# Patient Record
Sex: Female | Born: 1935
Health system: Southern US, Community
[De-identification: ages and names within clinical notes are randomized; demographics above are authoritative.]

## PROBLEM LIST (undated history)

## (undated) DIAGNOSIS — T4145XA Adverse effect of unspecified anesthetic, initial encounter: Secondary | ICD-10-CM

## (undated) DIAGNOSIS — F329 Major depressive disorder, single episode, unspecified: Secondary | ICD-10-CM

## (undated) DIAGNOSIS — G4730 Sleep apnea, unspecified: Secondary | ICD-10-CM

## (undated) DIAGNOSIS — R519 Headache, unspecified: Secondary | ICD-10-CM

## (undated) DIAGNOSIS — E049 Nontoxic goiter, unspecified: Secondary | ICD-10-CM

## (undated) DIAGNOSIS — F32A Depression, unspecified: Secondary | ICD-10-CM

## (undated) DIAGNOSIS — K5790 Diverticulosis of intestine, part unspecified, without perforation or abscess without bleeding: Secondary | ICD-10-CM

## (undated) DIAGNOSIS — M1990 Unspecified osteoarthritis, unspecified site: Secondary | ICD-10-CM

## (undated) DIAGNOSIS — Z9889 Other specified postprocedural states: Secondary | ICD-10-CM

## (undated) DIAGNOSIS — R011 Cardiac murmur, unspecified: Secondary | ICD-10-CM

## (undated) DIAGNOSIS — T8859XA Other complications of anesthesia, initial encounter: Secondary | ICD-10-CM

## (undated) DIAGNOSIS — I341 Nonrheumatic mitral (valve) prolapse: Secondary | ICD-10-CM

## (undated) DIAGNOSIS — R51 Headache: Secondary | ICD-10-CM

## (undated) DIAGNOSIS — E042 Nontoxic multinodular goiter: Secondary | ICD-10-CM

## (undated) DIAGNOSIS — H269 Unspecified cataract: Secondary | ICD-10-CM

## (undated) DIAGNOSIS — C3490 Malignant neoplasm of unspecified part of unspecified bronchus or lung: Secondary | ICD-10-CM

## (undated) DIAGNOSIS — M8580 Other specified disorders of bone density and structure, unspecified site: Secondary | ICD-10-CM

## (undated) DIAGNOSIS — R112 Nausea with vomiting, unspecified: Secondary | ICD-10-CM

## (undated) HISTORY — DX: Depression, unspecified: F32.A

## (undated) HISTORY — DX: Nonrheumatic mitral (valve) prolapse: I34.1

## (undated) HISTORY — DX: Diverticulosis of intestine, part unspecified, without perforation or abscess without bleeding: K57.90

## (undated) HISTORY — PX: BLADDER SUSPENSION: SHX72

## (undated) HISTORY — DX: Nontoxic multinodular goiter: E04.2

## (undated) HISTORY — DX: Unspecified osteoarthritis, unspecified site: M19.90

## (undated) HISTORY — DX: Unspecified cataract: H26.9

## (undated) HISTORY — DX: Other specified disorders of bone density and structure, unspecified site: M85.80

## (undated) HISTORY — DX: Major depressive disorder, single episode, unspecified: F32.9

## (undated) HISTORY — DX: Malignant neoplasm of unspecified part of unspecified bronchus or lung: C34.90

## (undated) HISTORY — PX: BREAST BIOPSY: SHX20

## (undated) HISTORY — PX: TONSILLECTOMY: SUR1361

## (undated) HISTORY — DX: Nontoxic goiter, unspecified: E04.9

## (undated) HISTORY — PX: COLONOSCOPY: SHX174

---
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
## 1996-11-02 HISTORY — PX: ABDOMINAL HYSTERECTOMY: SHX81

## 2000-11-02 DIAGNOSIS — C3490 Malignant neoplasm of unspecified part of unspecified bronchus or lung: Secondary | ICD-10-CM

## 2000-11-02 HISTORY — DX: Malignant neoplasm of unspecified part of unspecified bronchus or lung: C34.90

## 2001-10-02 HISTORY — PX: LOBECTOMY: SHX5089

## 2001-10-03 ENCOUNTER — Encounter: Payer: Self-pay | Admitting: Thoracic Surgery

## 2001-10-04 ENCOUNTER — Inpatient Hospital Stay (HOSPITAL_COMMUNITY): Admission: RE | Admit: 2001-10-04 | Discharge: 2001-10-10 | Payer: Self-pay | Admitting: Thoracic Surgery

## 2001-10-04 ENCOUNTER — Encounter: Payer: Self-pay | Admitting: Thoracic Surgery

## 2001-10-04 ENCOUNTER — Encounter (INDEPENDENT_AMBULATORY_CARE_PROVIDER_SITE_OTHER): Payer: Self-pay | Admitting: Specialist

## 2001-10-05 ENCOUNTER — Encounter: Payer: Self-pay | Admitting: Thoracic Surgery

## 2001-10-06 ENCOUNTER — Encounter: Payer: Self-pay | Admitting: Thoracic Surgery

## 2001-10-07 ENCOUNTER — Encounter: Payer: Self-pay | Admitting: Thoracic Surgery

## 2001-10-08 ENCOUNTER — Encounter: Payer: Self-pay | Admitting: Thoracic Surgery

## 2001-10-09 ENCOUNTER — Encounter: Payer: Self-pay | Admitting: Thoracic Surgery

## 2001-10-14 ENCOUNTER — Encounter: Admission: RE | Admit: 2001-10-14 | Discharge: 2001-10-14 | Payer: Self-pay | Admitting: Thoracic Surgery

## 2001-10-14 ENCOUNTER — Encounter: Payer: Self-pay | Admitting: Thoracic Surgery

## 2001-10-25 ENCOUNTER — Encounter: Payer: Self-pay | Admitting: Thoracic Surgery

## 2001-10-25 ENCOUNTER — Encounter: Admission: RE | Admit: 2001-10-25 | Discharge: 2001-10-25 | Payer: Self-pay | Admitting: Thoracic Surgery

## 2001-12-27 ENCOUNTER — Encounter: Payer: Self-pay | Admitting: Thoracic Surgery

## 2001-12-27 ENCOUNTER — Encounter: Admission: RE | Admit: 2001-12-27 | Discharge: 2001-12-27 | Payer: Self-pay | Admitting: Thoracic Surgery

## 2002-04-26 ENCOUNTER — Encounter: Admission: RE | Admit: 2002-04-26 | Discharge: 2002-04-26 | Payer: Self-pay | Admitting: Thoracic Surgery

## 2002-04-26 ENCOUNTER — Encounter: Payer: Self-pay | Admitting: Thoracic Surgery

## 2002-08-25 ENCOUNTER — Encounter: Admission: RE | Admit: 2002-08-25 | Discharge: 2002-08-25 | Payer: Self-pay | Admitting: Thoracic Surgery

## 2002-08-25 ENCOUNTER — Encounter: Payer: Self-pay | Admitting: Thoracic Surgery

## 2003-03-13 ENCOUNTER — Encounter: Payer: Self-pay | Admitting: Thoracic Surgery

## 2003-03-13 ENCOUNTER — Encounter: Admission: RE | Admit: 2003-03-13 | Discharge: 2003-03-13 | Payer: Self-pay | Admitting: Thoracic Surgery

## 2003-08-23 ENCOUNTER — Encounter: Admission: RE | Admit: 2003-08-23 | Discharge: 2003-08-23 | Payer: Self-pay | Admitting: Thoracic Surgery

## 2003-08-23 ENCOUNTER — Encounter: Payer: Self-pay | Admitting: Thoracic Surgery

## 2005-11-23 ENCOUNTER — Ambulatory Visit: Payer: Self-pay | Admitting: Internal Medicine

## 2006-01-13 ENCOUNTER — Ambulatory Visit: Payer: Self-pay | Admitting: Emergency Medicine

## 2006-01-14 ENCOUNTER — Ambulatory Visit: Payer: Self-pay | Admitting: Internal Medicine

## 2006-02-15 ENCOUNTER — Encounter: Admission: RE | Admit: 2006-02-15 | Discharge: 2006-02-15 | Payer: Self-pay | Admitting: Obstetrics and Gynecology

## 2006-07-19 ENCOUNTER — Ambulatory Visit: Payer: Self-pay | Admitting: Internal Medicine

## 2006-07-23 ENCOUNTER — Ambulatory Visit (HOSPITAL_COMMUNITY): Admission: RE | Admit: 2006-07-23 | Discharge: 2006-07-23 | Payer: Self-pay | Admitting: Internal Medicine

## 2007-02-22 ENCOUNTER — Encounter: Admission: RE | Admit: 2007-02-22 | Discharge: 2007-02-22 | Payer: Self-pay | Admitting: Obstetrics and Gynecology

## 2007-04-14 ENCOUNTER — Encounter: Admission: RE | Admit: 2007-04-14 | Discharge: 2007-04-14 | Payer: Self-pay | Admitting: Internal Medicine

## 2007-04-14 ENCOUNTER — Ambulatory Visit: Payer: Self-pay | Admitting: Internal Medicine

## 2007-04-14 DIAGNOSIS — E049 Nontoxic goiter, unspecified: Secondary | ICD-10-CM | POA: Insufficient documentation

## 2007-04-14 LAB — CONVERTED CEMR LAB
Basophils Absolute: 0.1 10*3/uL (ref 0.0–0.1)
Basophils Relative: 1.3 % — ABNORMAL HIGH (ref 0.0–1.0)
Eosinophils Absolute: 0.2 10*3/uL (ref 0.0–0.6)
Eosinophils Relative: 2.2 % (ref 0.0–5.0)
Free T4: 0.7 ng/dL (ref 0.6–1.6)
HCT: 39.6 % (ref 36.0–46.0)
Hemoglobin: 14.1 g/dL (ref 12.0–15.0)
Lymphocytes Relative: 36.1 % (ref 12.0–46.0)
MCHC: 35.6 g/dL (ref 30.0–36.0)
MCV: 91.8 fL (ref 78.0–100.0)
Monocytes Absolute: 0.4 10*3/uL (ref 0.2–0.7)
Monocytes Relative: 4.8 % (ref 3.0–11.0)
Neutro Abs: 4 10*3/uL (ref 1.4–7.7)
Neutrophils Relative %: 55.6 % (ref 43.0–77.0)
Platelets: 306 10*3/uL (ref 150–400)
RBC: 4.31 M/uL (ref 3.87–5.11)
RDW: 11.7 % (ref 11.5–14.6)
T3, Free: 2.8 pg/mL (ref 2.3–4.2)
TSH: 0.91 microintl units/mL (ref 0.35–5.50)
WBC: 7.3 10*3/uL (ref 4.5–10.5)

## 2007-04-19 ENCOUNTER — Encounter: Admission: RE | Admit: 2007-04-19 | Discharge: 2007-04-19 | Payer: Self-pay | Admitting: Internal Medicine

## 2007-06-06 ENCOUNTER — Telehealth: Payer: Self-pay | Admitting: Internal Medicine

## 2007-06-14 ENCOUNTER — Ambulatory Visit: Payer: Self-pay | Admitting: Internal Medicine

## 2007-06-15 ENCOUNTER — Ambulatory Visit: Payer: Self-pay | Admitting: Internal Medicine

## 2007-06-15 DIAGNOSIS — K5730 Diverticulosis of large intestine without perforation or abscess without bleeding: Secondary | ICD-10-CM | POA: Insufficient documentation

## 2007-06-15 LAB — CONVERTED CEMR LAB
ALT: 28 units/L (ref 0–35)
AST: 24 units/L (ref 0–37)
BUN: 8 mg/dL (ref 6–23)
Bilirubin Urine: NEGATIVE
Cholesterol: 201 mg/dL (ref 0–200)
Creatinine, Ser: 0.6 mg/dL (ref 0.4–1.2)
Direct LDL: 129.7 mg/dL
Glucose, Urine, Semiquant: NEGATIVE
HDL: 51.4 mg/dL (ref >39.0)
Nitrite: NEGATIVE
Potassium: 3.5 meq/L (ref 3.5–5.1)
Protein, U semiquant: NEGATIVE
Specific Gravity, Urine: <1.005
Total CHOL/HDL Ratio: 3.9
Triglycerides: 106 mg/dL (ref 0–149)
Urobilinogen, UA: NEGATIVE
VLDL: 21 mg/dL (ref 0–40)
pH: 6.5

## 2007-06-23 ENCOUNTER — Encounter (INDEPENDENT_AMBULATORY_CARE_PROVIDER_SITE_OTHER): Payer: Self-pay | Admitting: *Deleted

## 2007-07-25 ENCOUNTER — Ambulatory Visit: Payer: Self-pay | Admitting: Internal Medicine

## 2007-07-27 ENCOUNTER — Encounter: Payer: Self-pay | Admitting: Internal Medicine

## 2007-07-27 LAB — CBC WITH DIFFERENTIAL/PLATELET
BASO%: 1 % (ref 0.0–2.0)
Basophils Absolute: 0.1 10*3/uL (ref 0.0–0.1)
EOS%: 1.6 % (ref 0.0–7.0)
Eosinophils Absolute: 0.1 10*3/uL (ref 0.0–0.5)
HCT: 43.1 % (ref 34.8–46.6)
HGB: 15.2 g/dL (ref 11.6–15.9)
LYMPH%: 48.4 % — ABNORMAL HIGH (ref 14.0–48.0)
MCH: 31.8 pg (ref 26.0–34.0)
MCHC: 35.3 g/dL (ref 32.0–36.0)
MCV: 90.2 fL (ref 81.0–101.0)
MONO#: 0.6 10*3/uL (ref 0.1–0.9)
MONO%: 9.3 % (ref 0.0–13.0)
NEUT#: 2.3 10*3/uL (ref 1.5–6.5)
NEUT%: 39.7 % (ref 39.6–76.8)
Platelets: 271 10*3/uL (ref 145–400)
RBC: 4.78 10*6/uL (ref 3.70–5.32)
RDW: 13.6 % (ref 11.3–14.5)
WBC: 5.9 10*3/uL (ref 3.9–10.0)
lymph#: 2.9 10*3/uL (ref 0.9–3.3)

## 2007-07-27 LAB — COMPREHENSIVE METABOLIC PANEL
ALT: 22 U/L (ref 0–35)
AST: 26 U/L (ref 0–37)
Albumin: 4.4 g/dL (ref 3.5–5.2)
Alkaline Phosphatase: 74 U/L (ref 39–117)
BUN: 12 mg/dL (ref 6–23)
CO2: 26 mEq/L (ref 19–32)
Calcium: 9.4 mg/dL (ref 8.4–10.5)
Chloride: 102 mEq/L (ref 96–112)
Creatinine, Ser: 0.69 mg/dL (ref 0.40–1.20)
Glucose, Bld: 100 mg/dL — ABNORMAL HIGH (ref 70–99)
Potassium: 4 mEq/L (ref 3.5–5.3)
Sodium: 139 mEq/L (ref 135–145)
Total Bilirubin: 0.9 mg/dL (ref 0.3–1.2)
Total Protein: 7 g/dL (ref 6.0–8.3)

## 2007-10-03 ENCOUNTER — Ambulatory Visit: Payer: Self-pay | Admitting: Gastroenterology

## 2007-10-12 ENCOUNTER — Ambulatory Visit: Payer: Self-pay | Admitting: Internal Medicine

## 2007-11-21 DIAGNOSIS — C3490 Malignant neoplasm of unspecified part of unspecified bronchus or lung: Secondary | ICD-10-CM | POA: Insufficient documentation

## 2007-11-29 ENCOUNTER — Ambulatory Visit: Payer: Self-pay | Admitting: Internal Medicine

## 2007-12-30 ENCOUNTER — Encounter: Payer: Self-pay | Admitting: Internal Medicine

## 2008-03-16 ENCOUNTER — Encounter: Admission: RE | Admit: 2008-03-16 | Discharge: 2008-03-16 | Payer: Self-pay | Admitting: Internal Medicine

## 2008-07-24 ENCOUNTER — Ambulatory Visit: Payer: Self-pay | Admitting: Internal Medicine

## 2008-08-06 ENCOUNTER — Ambulatory Visit (HOSPITAL_COMMUNITY): Admission: RE | Admit: 2008-08-06 | Discharge: 2008-08-06 | Payer: Self-pay | Admitting: Internal Medicine

## 2008-08-06 ENCOUNTER — Encounter: Payer: Self-pay | Admitting: Internal Medicine

## 2008-08-06 LAB — CBC WITH DIFFERENTIAL/PLATELET
BASO%: 0.5 % (ref 0.0–2.0)
Basophils Absolute: 0 10*3/uL (ref 0.0–0.1)
EOS%: 1.9 % (ref 0.0–7.0)
Eosinophils Absolute: 0.1 10*3/uL (ref 0.0–0.5)
HCT: 42.7 % (ref 34.8–46.6)
HGB: 14.8 g/dL (ref 11.6–15.9)
LYMPH%: 44.5 % (ref 14.0–48.0)
MCH: 31.6 pg (ref 26.0–34.0)
MCHC: 34.5 g/dL (ref 32.0–36.0)
MCV: 91.4 fL (ref 81.0–101.0)
MONO#: 0.6 10*3/uL (ref 0.1–0.9)
MONO%: 7.5 % (ref 0.0–13.0)
NEUT#: 3.3 10*3/uL (ref 1.5–6.5)
NEUT%: 45.6 % (ref 39.6–76.8)
Platelets: 258 10*3/uL (ref 145–400)
RBC: 4.67 10*6/uL (ref 3.70–5.32)
RDW: 13 % (ref 11.3–14.5)
WBC: 7.3 10*3/uL (ref 3.9–10.0)
lymph#: 3.3 10*3/uL (ref 0.9–3.3)

## 2008-08-06 LAB — COMPREHENSIVE METABOLIC PANEL
ALT: 25 U/L (ref 0–35)
AST: 24 U/L (ref 0–37)
Albumin: 4.4 g/dL (ref 3.5–5.2)
Alkaline Phosphatase: 69 U/L (ref 39–117)
BUN: 16 mg/dL (ref 6–23)
CO2: 26 mEq/L (ref 19–32)
Calcium: 9.5 mg/dL (ref 8.4–10.5)
Chloride: 101 mEq/L (ref 96–112)
Creatinine, Ser: 0.7 mg/dL (ref 0.40–1.20)
Glucose, Bld: 107 mg/dL — ABNORMAL HIGH (ref 70–99)
Potassium: 4.1 mEq/L (ref 3.5–5.3)
Sodium: 139 mEq/L (ref 135–145)
Total Bilirubin: 0.8 mg/dL (ref 0.3–1.2)
Total Protein: 6.7 g/dL (ref 6.0–8.3)

## 2008-10-23 ENCOUNTER — Ambulatory Visit: Payer: Self-pay | Admitting: Internal Medicine

## 2008-10-23 DIAGNOSIS — D239 Other benign neoplasm of skin, unspecified: Secondary | ICD-10-CM | POA: Insufficient documentation

## 2008-10-23 DIAGNOSIS — M2550 Pain in unspecified joint: Secondary | ICD-10-CM | POA: Insufficient documentation

## 2008-10-23 DIAGNOSIS — E559 Vitamin D deficiency, unspecified: Secondary | ICD-10-CM | POA: Insufficient documentation

## 2008-10-23 DIAGNOSIS — M810 Age-related osteoporosis without current pathological fracture: Secondary | ICD-10-CM | POA: Insufficient documentation

## 2008-10-24 ENCOUNTER — Encounter (INDEPENDENT_AMBULATORY_CARE_PROVIDER_SITE_OTHER): Payer: Self-pay | Admitting: *Deleted

## 2008-10-24 LAB — CONVERTED CEMR LAB
ALT: 28 units/L (ref 0–35)
AST: 31 units/L (ref 0–37)
Albumin: 4.1 g/dL (ref 3.5–5.2)
Alkaline Phosphatase: 62 units/L (ref 39–117)
BUN: 10 mg/dL (ref 6–23)
Basophils Absolute: 0 10*3/uL (ref 0.0–0.1)
Basophils Relative: 0.3 % (ref 0.0–3.0)
Bilirubin, Direct: 0.1 mg/dL (ref 0.0–0.3)
CO2: 29 meq/L (ref 19–32)
Calcium: 9.2 mg/dL (ref 8.4–10.5)
Chloride: 102 meq/L (ref 96–112)
Cholesterol: 219 mg/dL (ref 0–200)
Creatinine, Ser: 0.6 mg/dL (ref 0.4–1.2)
Direct LDL: 137.2 mg/dL
Eosinophils Absolute: 0.1 10*3/uL (ref 0.0–0.7)
Eosinophils Relative: 2 % (ref 0.0–5.0)
GFR calc Af Amer: 127 mL/min
GFR calc non Af Amer: 105 mL/min
Glucose, Bld: 106 mg/dL — ABNORMAL HIGH (ref 70–99)
HCT: 43.4 % (ref 36.0–46.0)
HDL: 63.1 mg/dL (ref >39.0)
Hemoglobin: 15.2 g/dL — ABNORMAL HIGH (ref 12.0–15.0)
Lymphocytes Relative: 42.7 % (ref 12.0–46.0)
MCHC: 35 g/dL (ref 30.0–36.0)
MCV: 93.4 fL (ref 78.0–100.0)
Monocytes Absolute: 0.6 10*3/uL (ref 0.1–1.0)
Monocytes Relative: 9.6 % (ref 3.0–12.0)
Neutro Abs: 3 10*3/uL (ref 1.4–7.7)
Neutrophils Relative %: 45.4 % (ref 43.0–77.0)
Platelets: 240 10*3/uL (ref 150–400)
Potassium: 3.2 meq/L — ABNORMAL LOW (ref 3.5–5.1)
RBC: 4.64 M/uL (ref 3.87–5.11)
RDW: 11.8 % (ref 11.5–14.6)
Rhuematoid fact SerPl-aCnc: <20 intl units/mL — ABNORMAL LOW (ref 0.0–20.0)
Sed Rate: 7 mm/hr (ref 0–22)
Sodium: 140 meq/L (ref 135–145)
TSH: 1.63 microintl units/mL (ref 0.35–5.50)
Total Bilirubin: 1.3 mg/dL — ABNORMAL HIGH (ref 0.3–1.2)
Total CHOL/HDL Ratio: 3.5
Total Protein: 6.9 g/dL (ref 6.0–8.3)
Triglycerides: 124 mg/dL (ref 0–149)
Uric Acid, Serum: 7.2 mg/dL — ABNORMAL HIGH (ref 2.4–7.0)
VLDL: 25 mg/dL (ref 0–40)
WBC: 6.5 10*3/uL (ref 4.5–10.5)

## 2009-01-22 ENCOUNTER — Ambulatory Visit: Payer: Self-pay | Admitting: Internal Medicine

## 2009-01-29 ENCOUNTER — Telehealth (INDEPENDENT_AMBULATORY_CARE_PROVIDER_SITE_OTHER): Payer: Self-pay | Admitting: *Deleted

## 2009-01-29 LAB — CONVERTED CEMR LAB
BUN: 14 mg/dL (ref 6–23)
Creatinine, Ser: 0.8 mg/dL (ref 0.4–1.2)
Hgb A1c MFr Bld: 5.7 % (ref 4.6–6.5)
Potassium: 4.1 meq/L (ref 3.5–5.1)
Uric Acid, Serum: 6.4 mg/dL (ref 2.4–7.0)

## 2009-02-04 ENCOUNTER — Ambulatory Visit: Payer: Self-pay | Admitting: Internal Medicine

## 2009-02-04 DIAGNOSIS — E782 Mixed hyperlipidemia: Secondary | ICD-10-CM | POA: Insufficient documentation

## 2009-02-04 LAB — CONVERTED CEMR LAB
Cholesterol, target level: 200 mg/dL
HDL goal, serum: 50 mg/dL
LDL Goal: 80 mg/dL

## 2009-03-11 ENCOUNTER — Ambulatory Visit: Payer: Self-pay | Admitting: Internal Medicine

## 2009-03-29 ENCOUNTER — Encounter: Admission: RE | Admit: 2009-03-29 | Discharge: 2009-03-29 | Payer: Self-pay | Admitting: Internal Medicine

## 2009-04-10 ENCOUNTER — Ambulatory Visit: Payer: Self-pay | Admitting: Internal Medicine

## 2009-04-11 LAB — CONVERTED CEMR LAB
ALT: 24 units/L (ref 0–35)
AST: 34 units/L (ref 0–37)
Albumin: 3.9 g/dL (ref 3.5–5.2)
Alkaline Phosphatase: 75 units/L (ref 39–117)
Amylase: 96 units/L (ref 27–131)
Basophils Absolute: 0 10*3/uL (ref 0.0–0.1)
Basophils Relative: 0.2 % (ref 0.0–3.0)
Bilirubin, Direct: 0.2 mg/dL (ref 0.0–0.3)
Eosinophils Absolute: 0.1 10*3/uL (ref 0.0–0.7)
Eosinophils Relative: 2.2 % (ref 0.0–5.0)
HCT: 42.9 % (ref 36.0–46.0)
Hemoglobin: 15.2 g/dL — ABNORMAL HIGH (ref 12.0–15.0)
Lipase: 22 units/L (ref 11.0–59.0)
Lymphocytes Relative: 42.4 % (ref 12.0–46.0)
Lymphs Abs: 2.8 10*3/uL (ref 0.7–4.0)
MCHC: 35.4 g/dL (ref 30.0–36.0)
MCV: 92.1 fL (ref 78.0–100.0)
Monocytes Absolute: 0.6 10*3/uL (ref 0.1–1.0)
Monocytes Relative: 9.5 % (ref 3.0–12.0)
Neutro Abs: 3.1 10*3/uL (ref 1.4–7.7)
Neutrophils Relative %: 45.7 % (ref 43.0–77.0)
Platelets: 281 10*3/uL (ref 150.0–400.0)
RBC: 4.65 M/uL (ref 3.87–5.11)
RDW: 11.9 % (ref 11.5–14.6)
Total Bilirubin: 0.8 mg/dL (ref 0.3–1.2)
Total Protein: 7.1 g/dL (ref 6.0–8.3)
WBC: 6.6 10*3/uL (ref 4.5–10.5)

## 2009-04-12 ENCOUNTER — Telehealth (INDEPENDENT_AMBULATORY_CARE_PROVIDER_SITE_OTHER): Payer: Self-pay | Admitting: *Deleted

## 2009-04-12 ENCOUNTER — Encounter (INDEPENDENT_AMBULATORY_CARE_PROVIDER_SITE_OTHER): Payer: Self-pay | Admitting: *Deleted

## 2009-04-15 ENCOUNTER — Encounter (INDEPENDENT_AMBULATORY_CARE_PROVIDER_SITE_OTHER): Payer: Self-pay | Admitting: *Deleted

## 2009-04-17 ENCOUNTER — Telehealth (INDEPENDENT_AMBULATORY_CARE_PROVIDER_SITE_OTHER): Payer: Self-pay | Admitting: *Deleted

## 2009-04-18 ENCOUNTER — Ambulatory Visit: Payer: Self-pay | Admitting: Internal Medicine

## 2009-04-18 ENCOUNTER — Encounter (INDEPENDENT_AMBULATORY_CARE_PROVIDER_SITE_OTHER): Payer: Self-pay | Admitting: *Deleted

## 2009-04-19 ENCOUNTER — Encounter (INDEPENDENT_AMBULATORY_CARE_PROVIDER_SITE_OTHER): Payer: Self-pay | Admitting: *Deleted

## 2009-04-26 ENCOUNTER — Ambulatory Visit: Payer: Self-pay | Admitting: Internal Medicine

## 2009-04-26 DIAGNOSIS — R32 Unspecified urinary incontinence: Secondary | ICD-10-CM | POA: Insufficient documentation

## 2009-04-29 ENCOUNTER — Ambulatory Visit: Payer: Self-pay

## 2009-04-29 ENCOUNTER — Encounter: Payer: Self-pay | Admitting: Internal Medicine

## 2009-05-02 ENCOUNTER — Encounter (INDEPENDENT_AMBULATORY_CARE_PROVIDER_SITE_OTHER): Payer: Self-pay | Admitting: *Deleted

## 2009-05-07 ENCOUNTER — Ambulatory Visit (HOSPITAL_COMMUNITY): Admission: RE | Admit: 2009-05-07 | Discharge: 2009-05-08 | Payer: Self-pay | Admitting: Obstetrics

## 2009-08-02 ENCOUNTER — Ambulatory Visit: Payer: Self-pay | Admitting: Internal Medicine

## 2009-08-02 ENCOUNTER — Ambulatory Visit (HOSPITAL_COMMUNITY): Admission: RE | Admit: 2009-08-02 | Discharge: 2009-08-02 | Payer: Self-pay | Admitting: Internal Medicine

## 2009-08-06 ENCOUNTER — Encounter: Payer: Self-pay | Admitting: Internal Medicine

## 2009-08-06 LAB — CBC WITH DIFFERENTIAL/PLATELET
BASO%: 0.7 % (ref 0.0–2.0)
Basophils Absolute: 0 10*3/uL (ref 0.0–0.1)
EOS%: 2.1 % (ref 0.0–7.0)
Eosinophils Absolute: 0.1 10*3/uL (ref 0.0–0.5)
HCT: 42 % (ref 34.8–46.6)
HGB: 14.3 g/dL (ref 11.6–15.9)
LYMPH%: 37.2 % (ref 14.0–49.7)
MCH: 31.5 pg (ref 25.1–34.0)
MCHC: 34.1 g/dL (ref 31.5–36.0)
MCV: 92.3 fL (ref 79.5–101.0)
MONO#: 0.5 10*3/uL (ref 0.1–0.9)
MONO%: 8.8 % (ref 0.0–14.0)
NEUT#: 3.1 10*3/uL (ref 1.5–6.5)
NEUT%: 51.2 % (ref 38.4–76.8)
Platelets: 251 10*3/uL (ref 145–400)
RBC: 4.54 10*6/uL (ref 3.70–5.45)
RDW: 12.9 % (ref 11.2–14.5)
WBC: 6.1 10*3/uL (ref 3.9–10.3)
lymph#: 2.3 10*3/uL (ref 0.9–3.3)

## 2009-08-06 LAB — COMPREHENSIVE METABOLIC PANEL
ALT: 20 U/L (ref 0–35)
AST: 27 U/L (ref 0–37)
Albumin: 4.2 g/dL (ref 3.5–5.2)
Alkaline Phosphatase: 70 U/L (ref 39–117)
BUN: 17 mg/dL (ref 6–23)
CO2: 28 mEq/L (ref 19–32)
Calcium: 9.3 mg/dL (ref 8.4–10.5)
Chloride: 101 mEq/L (ref 96–112)
Creatinine, Ser: 0.77 mg/dL (ref 0.40–1.20)
Glucose, Bld: 154 mg/dL — ABNORMAL HIGH (ref 70–99)
Potassium: 4.1 mEq/L (ref 3.5–5.3)
Sodium: 139 mEq/L (ref 135–145)
Total Bilirubin: 0.7 mg/dL (ref 0.3–1.2)
Total Protein: 6.4 g/dL (ref 6.0–8.3)

## 2009-08-30 ENCOUNTER — Telehealth: Payer: Self-pay | Admitting: Internal Medicine

## 2009-09-10 ENCOUNTER — Encounter: Admission: RE | Admit: 2009-09-10 | Discharge: 2009-09-10 | Payer: Self-pay | Admitting: Internal Medicine

## 2009-09-11 ENCOUNTER — Encounter (INDEPENDENT_AMBULATORY_CARE_PROVIDER_SITE_OTHER): Payer: Self-pay | Admitting: *Deleted

## 2009-11-25 ENCOUNTER — Ambulatory Visit: Payer: Self-pay | Admitting: Internal Medicine

## 2009-11-25 DIAGNOSIS — R109 Unspecified abdominal pain: Secondary | ICD-10-CM | POA: Insufficient documentation

## 2009-11-25 DIAGNOSIS — R198 Other specified symptoms and signs involving the digestive system and abdomen: Secondary | ICD-10-CM | POA: Insufficient documentation

## 2009-11-25 LAB — CONVERTED CEMR LAB
Bilirubin Urine: NEGATIVE
Blood in Urine, dipstick: NEGATIVE
Glucose, Urine, Semiquant: NEGATIVE
Ketones, urine, test strip: NEGATIVE
Nitrite: NEGATIVE
Protein, U semiquant: NEGATIVE
Specific Gravity, Urine: <1.005
Urobilinogen, UA: 0.2
pH: 6.5

## 2009-11-26 ENCOUNTER — Encounter: Payer: Self-pay | Admitting: Internal Medicine

## 2009-12-27 ENCOUNTER — Ambulatory Visit: Payer: Self-pay | Admitting: Family

## 2009-12-27 ENCOUNTER — Ambulatory Visit (HOSPITAL_BASED_OUTPATIENT_CLINIC_OR_DEPARTMENT_OTHER): Admission: RE | Admit: 2009-12-27 | Discharge: 2009-12-27 | Payer: Self-pay | Admitting: Internal Medicine

## 2009-12-27 ENCOUNTER — Ambulatory Visit: Payer: Self-pay | Admitting: Diagnostic Radiology

## 2009-12-27 DIAGNOSIS — J209 Acute bronchitis, unspecified: Secondary | ICD-10-CM | POA: Insufficient documentation

## 2009-12-27 DIAGNOSIS — J1189 Influenza due to unidentified influenza virus with other manifestations: Secondary | ICD-10-CM | POA: Insufficient documentation

## 2009-12-27 LAB — CONVERTED CEMR LAB
Inflenza A Ag: POSITIVE
Influenza B Ag: NEGATIVE

## 2010-01-03 ENCOUNTER — Telehealth: Payer: Self-pay | Admitting: Family

## 2010-01-03 ENCOUNTER — Ambulatory Visit: Payer: Self-pay | Admitting: Family

## 2010-01-10 ENCOUNTER — Ambulatory Visit: Payer: Self-pay | Admitting: Family

## 2010-01-17 ENCOUNTER — Ambulatory Visit: Payer: Self-pay | Admitting: Internal Medicine

## 2010-01-17 DIAGNOSIS — R6883 Chills (without fever): Secondary | ICD-10-CM | POA: Insufficient documentation

## 2010-01-17 DIAGNOSIS — IMO0001 Reserved for inherently not codable concepts without codable children: Secondary | ICD-10-CM | POA: Insufficient documentation

## 2010-01-17 DIAGNOSIS — H8110 Benign paroxysmal vertigo, unspecified ear: Secondary | ICD-10-CM | POA: Insufficient documentation

## 2010-01-18 ENCOUNTER — Encounter: Payer: Self-pay | Admitting: Internal Medicine

## 2010-01-18 LAB — CONVERTED CEMR LAB
Albumin: 3.7 g/dL (ref 3.5–5.2)
BUN: 12 mg/dL (ref 6–23)
Basophils Absolute: 0 10*3/uL (ref 0.0–0.1)
Basophils Relative: 0.3 % (ref 0.0–3.0)
CO2: 29 meq/L (ref 19–32)
Calcium: 8.8 mg/dL (ref 8.4–10.5)
Chloride: 104 meq/L (ref 96–112)
Creatinine, Ser: 0.7 mg/dL (ref 0.4–1.2)
Eosinophils Absolute: 0 10*3/uL (ref 0.0–0.7)
Eosinophils Relative: 0.3 % (ref 0.0–5.0)
GFR calc non Af Amer: 87.39 mL/min (ref >60)
Glucose, Bld: 79 mg/dL (ref 70–99)
HCT: 41.5 % (ref 36.0–46.0)
Hemoglobin: 13.4 g/dL (ref 12.0–15.0)
Lymphocytes Relative: 5.9 % — ABNORMAL LOW (ref 12.0–46.0)
Lymphs Abs: 0.8 10*3/uL (ref 0.7–4.0)
MCHC: 32.4 g/dL (ref 30.0–36.0)
MCV: 95.5 fL (ref 78.0–100.0)
Monocytes Absolute: 0.5 10*3/uL (ref 0.1–1.0)
Monocytes Relative: 3.9 % (ref 3.0–12.0)
Neutro Abs: 11.7 10*3/uL — ABNORMAL HIGH (ref 1.4–7.7)
Neutrophils Relative %: 89.6 % — ABNORMAL HIGH (ref 43.0–77.0)
Phosphorus: 2.4 mg/dL (ref 2.3–4.6)
Platelets: 220 10*3/uL (ref 150.0–400.0)
Potassium: 2.9 meq/L — ABNORMAL LOW (ref 3.5–5.1)
RBC: 4.35 M/uL (ref 3.87–5.11)
RDW: 12.5 % (ref 11.5–14.6)
Sodium: 139 meq/L (ref 135–145)
WBC: 13 10*3/uL — ABNORMAL HIGH (ref 4.5–10.5)

## 2010-01-21 ENCOUNTER — Telehealth: Payer: Self-pay | Admitting: Internal Medicine

## 2010-01-23 ENCOUNTER — Ambulatory Visit: Payer: Self-pay | Admitting: Internal Medicine

## 2010-01-27 LAB — CONVERTED CEMR LAB
Cortisol, Plasma: 16.8 ug/dL
Potassium: 3.8 meq/L (ref 3.5–5.1)
Vit D, 25-Hydroxy: 31 ng/mL (ref 30–89)

## 2010-09-12 ENCOUNTER — Ambulatory Visit: Payer: Self-pay | Admitting: Internal Medicine

## 2010-09-15 ENCOUNTER — Ambulatory Visit (HOSPITAL_COMMUNITY): Admission: RE | Admit: 2010-09-15 | Discharge: 2010-09-15 | Payer: Self-pay | Admitting: Internal Medicine

## 2010-09-16 ENCOUNTER — Encounter: Payer: Self-pay | Admitting: Internal Medicine

## 2010-09-16 LAB — COMPREHENSIVE METABOLIC PANEL
ALT: 35 U/L (ref 0–35)
AST: 36 U/L (ref 0–37)
Albumin: 4.3 g/dL (ref 3.5–5.2)
Alkaline Phosphatase: 76 U/L (ref 39–117)
BUN: 15 mg/dL (ref 6–23)
CO2: 23 mEq/L (ref 19–32)
Calcium: 9.2 mg/dL (ref 8.4–10.5)
Chloride: 105 mEq/L (ref 96–112)
Creatinine, Ser: 0.67 mg/dL (ref 0.40–1.20)
Glucose, Bld: 123 mg/dL — ABNORMAL HIGH (ref 70–99)
Potassium: 3.7 mEq/L (ref 3.5–5.3)
Sodium: 141 mEq/L (ref 135–145)
Total Bilirubin: 0.5 mg/dL (ref 0.3–1.2)
Total Protein: 6.6 g/dL (ref 6.0–8.3)

## 2010-09-16 LAB — CBC WITH DIFFERENTIAL/PLATELET
BASO%: 0.4 % (ref 0.0–2.0)
Basophils Absolute: 0 10e3/uL (ref 0.0–0.1)
EOS%: 0 % (ref 0.0–7.0)
Eosinophils Absolute: 0 10e3/uL (ref 0.0–0.5)
HCT: 44.1 % (ref 34.8–46.6)
HGB: 15.4 g/dL (ref 11.6–15.9)
LYMPH%: 20.6 % (ref 14.0–49.7)
MCH: 32.3 pg (ref 25.1–34.0)
MCHC: 34.9 g/dL (ref 31.5–36.0)
MCV: 92.5 fL (ref 79.5–101.0)
MONO#: 1.2 10e3/uL — ABNORMAL HIGH (ref 0.1–0.9)
MONO%: 12.9 % (ref 0.0–14.0)
NEUT#: 6.3 10e3/uL (ref 1.5–6.5)
NEUT%: 66.1 % (ref 38.4–76.8)
Platelets: 269 10e3/uL (ref 145–400)
RBC: 4.77 10e6/uL (ref 3.70–5.45)
RDW: 13.1 % (ref 11.2–14.5)
WBC: 9.5 10e3/uL (ref 3.9–10.3)
lymph#: 2 10e3/uL (ref 0.9–3.3)

## 2010-10-01 ENCOUNTER — Ambulatory Visit: Payer: Self-pay | Admitting: Internal Medicine

## 2010-11-23 ENCOUNTER — Encounter: Payer: Self-pay | Admitting: Obstetrics and Gynecology

## 2010-12-04 NOTE — Letter (Signed)
Summary: Results Follow-up Letter  [facility]  [address] [place], [place] [address]   Phone: [phone]  Fax: [phone]    05/02/2009       Maria [name] [address] [place], [place]  [address]  Dear Ms. Frerking,   The following are the results of your recent test(s):  Test     Result     Pap Smear    Normal_______  Not Normal_____       Comments: _________________________________________________________ Cholesterol LDL(Bad cholesterol):          Your goal is less than:         HDL (Good cholesterol):        Your goal is more than: _________________________________________________________ Other Tests:   _________________________________________________________  Please call for an appointment Or __Please see attached labwork_______________________________________________________ _________________________________________________________ _________________________________________________________  Sincerely,  Ardyth Man Primera at [facility]

## 2010-12-04 NOTE — Procedures (Signed)
Summary: Gastroenterology COLON  Gastroenterology COLON   Imported By: June McMurray 11/23/2007 14:49:45  _____________________________________________________________________  External Attachment:    Type:   Image     Comment:   External Document

## 2010-12-04 NOTE — Progress Notes (Signed)
Summary: Thyroid Scan Results  Phone Note Call from Patient Call back at [phone]   Summary of Call: Message left on voicemail: patient had a thyroid scan done 2 years ago and thinks its time to recheck   I will discuss with Dr.Treyten Monestime  Shonna Chock  August 30, 2009 2:24 PM   Follow-up for Phone Call        Alfonse Flavors, based on scan in 2008 you said:  excellent stable findings; annual manual exam of thyroid recommended.[place] will be rescheduled based on any changes on exam. Hopp  Patient had an appointment for surgical clearance on 04/26/2009, there was no mention of ordering U/S which mean manual was fine. Do you feel U/S is needed??   Follow-up by: Shonna Chock,  August 30, 2009 5:22 PM  Additional Follow-up for Phone Call Additional follow up Details #1::        PATIENT SCH'D FOR U/S THYROID ON 09-10-09, I S/W PATIENT SHE IS AWARE Magdalen Spatz [facility]  September 03, 2009 2:05 PM

## 2010-12-04 NOTE — Progress Notes (Signed)
Summary: PT NEEDS ECHO CXR //HOP SEE PLEASE  Phone Note Call from Patient Call back at [phone]. **[phone]**   Caller: Patient Summary of Call: pt has surgical clearance appt scheduled for friday  04/2509, having surgery July 6th bladder tack and cystocele, Dr  Viviann Spare is requesting an Echocardiogram and chestxray and EKG, can I have  Cxr aND ECHO SOONER? IF EARLIER IT HAS TO BE TOMORROW MORNING Initial call taken by: Kandice Hams,  April 17, 2009 9:30 AM  Follow-up for Phone Call        DR.HOPPER PLEASE ADVISE: PATIENT WOULD LIKE TO KNOW IF YOU CAN GIVE ORDER FOR ECHO AND CHEST X-RAY (PATIENT WOULD LIKE BOTH OF THESE SET UP FOR TOMORROW MORNING) Follow-up by: Shonna Chock,  April 17, 2009 9:53 AM  Additional Follow-up for Phone Call Additional follow up Details #1::        V72.84, 789.00, 162.9,PMH of Additional Follow-up by: Marga Melnick MD,  April 17, 2009 11:27 AM    Additional Follow-up for Phone Call Additional follow up Details #2::    SPOKE WITH PATIENT WE CAN SET UP CXR FOR TOMORROW BUT ECHO COULD BE @ LEAST A WEEK OUT. PATIENT OK'D SHE WILL BE GOING OUT OF TOWN LATER TOMORROW AND WILL COMING BACK IN TOWN ON 04/25/2009. PATIENT PREFERS APPOINTMENT TO BE EARLY AS POSSIBLE ON THE 25TH (FYI-APPOINTMENT WITH DR.HOPPER @ 11AM ON 04/26/2009) Follow-up by: Shonna Chock,  April 17, 2009 3:17 PM

## 2010-12-04 NOTE — Assessment & Plan Note (Signed)
Summary: fever of 103,chills,//tl   Vital Signs:  Patient Profile:   75 Years Old Female Weight:      173.25 pounds Temp:     98.9 degrees F oral Pulse rate:   100 / minute Pulse rhythm:   regular BP sitting:   122 / 80  (left arm) Cuff size:   large  Pt. in pain?   yes    Location:   "all over"    Intensity:   8    Type:       aching  Vitals Entered By: Wendall Stade (June 15, 2007 12:22 PM)                Chief Complaint:  fever for several days.  History of Present Illness: sudden onset two days ago, body aches, had fatigue for a while before this hit.  Worried it could be lyme disease.  Also having frequency but drinks alot of water usually  Current Allergies (reviewed today): SULFA Updated/Current Medications (including changes made in today's visit):  CYMBALTA 30 MG  CPEP (DULOXETINE HCL) 1 by mouth qd CIPRO 500 MG TABS (CIPROFLOXACIN HCL) Take one (1) by mouth twice a day   Past Surgical History:    RULobectomy 10/2001;no Radiation or chemo    Colonoscopy : diverticulosis     Review of Systems  General      See HPI      Complains of chills, fatigue, fever, loss of appetite, malaise, and sweats.      Denies weight loss.  Eyes      Denies discharge, eye irritation, and red eye.  ENT      Denies nasal congestion, sinus pressure, and sore throat.  Resp      Denies cough and sputum productive.  GI      Denies diarrhea.      intermittent cramping  GU      Complains of incontinence and urinary frequency.      Denies abnormal vaginal bleeding, discharge, dysuria, genital sores, and hematuria.      pessary in place; cleaned q 3 months @ [facility]  MS      Complains of joint pain, low back pain, mid back pain, muscle aches, cramps, and thoracic pain.      Denies joint redness and joint swelling.  Derm      Denies changes in color of skin, lesion(s), and rash.   Physical Exam  General:     appears mildly uncomfortable Eyes:     no  icterus Ears:     External ear exam shows no significant lesions or deformities.  Otoscopic examination reveals clear canals, tympanic membranes are intact bilaterally without bulging, retraction, inflammation or discharge. Hearing is grossly normal bilaterally. Nose:     External nasal examination shows no deformity or inflammation. Nasal mucosa are pink and moist without lesions or exudates. Mouth:     Oral mucosa and oropharynx without lesions or exudates.  Teeth in good repair. Neck:     No deformities, masses, or tenderness noted. Supple Lungs:     Normal respiratory effort, chest expands symmetrically. Lungs are clear to auscultation, no crackles or wheezes. Heart:     Normal rate and regular rhythm. S1 and S2 normal without gallop, murmur, click, rub or other extra sounds. Abdomen:     Bowel sounds positive,abdomen soft ;  but tender  RLQ .Without masses, organomegaly or hernias noted. No adnexal tenderness on rectal. Rectal:     No external abnormalities  noted. Normal sphincter tone. No rectal masses or tenderness.FOB neg Msk:     No flank tenderness to percussion Neurologic:     neg SLR Skin:     Intact without suspicious lesions or rashes Cervical Nodes:     No lymphadenopathy noted Axillary Nodes:     No palpable lymphadenopathy    Impression & Recommendations:  Problem # 1:  FEVER (ICD-780.6)  Problem # 2:  U T I (ICD-599.0) urine: large leukocytes Her updated medication list for this problem includes:    Cipro 500 Mg Tabs (Ciprofloxacin hcl) .Marland Kitchen... 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 1 by mouth qd 2)  Cipro 500 Mg Tabs (Ciprofloxacin hcl) .... Take one (1) by mouth twice a day  Other Orders: UA Dipstick w/o Micro (62130) T-Culture, Urine ([id number])   Patient Instructions: 1)   Drink as much fluid as you can tolerate for the next few days. Advil or Tylenol every 4 hrs as needed fever.    Prescriptions: CIPRO 500 MG TABS (CIPROFLOXACIN HCL) Take one (1) by mouth twice a day  #20 x 0   Entered and Authorized by:   Marga Melnick MD   Signed by:   Marga Melnick MD on 06/15/2007   Method used:   Print then Give to Patient   RxID:   [id number]      Laboratory Results   Urine Tests  Date/Time Recieved: June 15, 2007 12:26 PM   Routine Urinalysis   Glucose: negative   (Normal Range: Negative) Bilirubin: negative   (Normal Range: Negative) Ketone: moderate (40)   (Normal Range: Negative) Spec. Gravity: <1.005   (Normal Range: 1.003-1.035) Blood: trace-intact   (Normal Range: Negative) pH: 6.5   (Normal Range: 5.0-8.0) Protein: negative   (Normal Range: Negative) Urobilinogen: negative   (Normal Range: 0-1) Nitrite: negative   (Normal Range: Negative) Leukocyte Esterace: large   (Normal Range: Negative)    Comments: sent for culture and sensitivity 599.0

## 2010-12-04 NOTE — Assessment & Plan Note (Signed)
Summary: cpx & lab/cbs   Vital Signs:  Patient Profile:   75 Years Old Female Height:     66 inches Weight:      180.4 pounds Temp:     98.2 degrees F oral Pulse rate:   78 / minute Resp:     15 per minute BP sitting:   168 / 103  (left arm) Cuff size:   large  Vitals Entered By: Shonna Chock (October 23, 2008 9:49 AM)             Comments 148/90-RECHECKED B/P     Chief Complaint:  CPX WITH FASTING LABS / [facility].  History of Present Illness: Diffuse joint pains for months; PMH of fibromyalgia but most symptoms now non muscular. Rx: Advil 2 once daily . Appt 10/29/08 with Dr Cleophas Dunker.Informal  MRI of knee last week revealed effusion.    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 mycosis fungoides); P aunt lung CA; M uncle lung CA; MGF lung CA  Social History:    Retired    Alcohol use-yes    Regular exercise-yes   Risk Factors:  Alcohol use:  yes Exercise:  yes    Times per week:  7    Type:  walks 60 min /day w/o symptoms   Review of Systems       The patient complains of suspicious skin lesions.  The patient denies anorexia, fever, weight loss, vision loss, decreased hearing, hoarseness, chest pain, syncope, dyspnea on exertion, headaches, abdominal pain, melena, hematochezia, severe indigestion/heartburn, abnormal bleeding, enlarged lymph nodes, and angioedema.         8# weight gain, now on [employer]. Occa edema after standing for extended periods.Facial lesion  L cheek several months  Resp      Denies chest pain with inspiration, cough, coughing up blood, shortness of breath, sputum productive, and wheezing.      CXray 9/09 ,Dr Arbutus Ped  GU      Complains of discharge and incontinence.      Denies abnormal vaginal bleeding, dysuria, hematuria, and urinary frequency.      Recurrent yeast infections treated by [facility]. Occa nocturia X 1  MS      Complains of joint pain and low back pain.      Denies joint redness, joint swelling, mid back pain, muscle aches, muscle weakness, and thoracic pain.      50,000 International Units vit D Rxed ,not started  Psych      Denies anxiety, depression, easily angered, easily tearful, and irritability.   Physical Exam  General:     well-nourished,in no acute distress; alert,appropriate and cooperative throughout examination; appears younger than age Head:     Normocephalic and atraumatic without obvious abnormalities.  Eyes:     No corneal or conjunctival inflammation noted.Perrla. Funduscopic exam benign, without hemorrhages, exudates or papilledema. Ears:     External ear exam shows no significant lesions or deformities.  Otoscopic examination  reveals clear canals, tympanic membranes are intact bilaterally without bulging, retraction, inflammation or discharge. Hearing is grossly normal bilaterally. Nose:     External nasal examination shows no deformity or inflammation. Nasal mucosa are pink and moist without lesions or exudates. Mouth:     Oral mucosa and oropharynx without lesions or exudates.  Teeth in good repair. Neck:     No deformities, masses, or tenderness noted. R lobe > L ; no nodules appreciated Lungs:     Normal respiratory effort, chest expands symmetrically. Lungs are clear to auscultation, no crackles or wheezes. Heart:     Normal rate and regular rhythm. S1 and S2 normal without gallop, murmur, click, rub or other extra sounds. Abdomen:     Bowel sounds positive,abdomen soft  and non-tender without masses, organomegaly or hernias noted. Rectal:     Given stool cards Genitalia:     [facility] Msk:     No deformity or scoliosis noted of thoracic or lumbar spine.   Pulses:     R and L carotid,radial,dorsalis pedis and posterior tibial pulses are full and equal bilaterally Extremities:     No clubbing, cyanosis, edema. DJD hand changes ;marked crepitus of knees ,R>L Neurologic:     alert & oriented X3 and DTRs symmetrical and normal.   Skin:     Dilated veins & slight roughness L maxillary area Cervical Nodes:     No lymphadenopathy noted Axillary Nodes:     No palpable lymphadenopathy Psych:     memory intact for recent and remote, normally interactive, good eye contact, not anxious appearing, and not depressed appearing.      Impression & Recommendations:  Problem # 1:  ROUTINE GENERAL MEDICAL EXAM@HEALTH  CARE FACL (ICD-V70.0)  Orders: EKG w/ Interpretation ([address]) Venipuncture ([address]) TLB-Lipid Panel (80061-LIPID) TLB-BMP (Basic Metabolic Panel-BMET) (80048-METABOL) TLB-CBC Platelet - w/Differential (85025-CBCD) TLB-Hepatic/Liver Function Pnl (80076-HEPATIC) TLB-TSH (Thyroid Stimulating Hormone) (84443-TSH) TLB-Uric Acid, Blood (84550-URIC) TLB-Rheumatoid Factor (RA) (60454-UJ) TLB-Sedimentation Rate (ESR) (85652-ESR)   Problem # 2:  POLYARTHRALGIA (ICD-719.49)  Orders: Venipuncture (81191) TLB-Uric Acid, Blood (84550-URIC) TLB-Rheumatoid Factor (RA) (86431-RA) TLB-Sedimentation Rate (ESR) (85652-ESR)   Problem # 3:  NEOPLASM, MALIGNANT, LUNG, 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   ]

## 2010-12-04 NOTE — Assessment & Plan Note (Signed)
Summary: dizzy/cbs   Vital Signs:  Patient profile:   75 year old female Weight:      172.8 pounds BMI:     27.99 Temp:     97.7 degrees F oral Pulse (ortho):   96 / minute Resp:     17 per minute BP standing:   132 / 86  Vitals Entered By: Shonna Chock CMA (October 01, 2010 10:44 AM) CC: 1.) Dizziness, cough and congestion x 5 days    2.) Examine left arm- patient had flu vaccine 6 weeks in [place], still with muscle pains ( 1st time patient had vaccine) , Syncope, URI symptoms   Serial Vital Signs/Assessments:  Time      Position  BP       Pulse  Resp  Temp     By 10:46 AM  Lying LA  126/80   97                    Chrae Malloy CMA 10:46 AM  Sitting   130/86   96                    Chrae Malloy CMA 10:46 AM  Standing  132/86   96                    Chrae Malloy CMA   CC:  1.) Dizziness, cough and congestion x 5 days    2.) Examine left arm- patient had flu vaccine 6 weeks in [place], still with muscle pains ( 1st time patient had vaccine) , Syncope, and URI symptoms.  History of Present Illness: Dizziness:      This is a 75 year old woman who presents with dizziness X 5 days in context of URI.  The patient reports lightheadedness, but denies loss of consciousness, premonitory symptoms, near loss of consciousness, palpitations, chest pain, or shortness of breath.  Associated symptoms include nausea.  The patient denies the following symptoms: headache, vomiting, feeling warm, pallor, diaphoresis, focal weakness, blurred vision, and perioral numbness.  The patient reports the following precipitating factors: change in position.  The l symptoms have mainly  occurred in the setting of supine position and  with standing  up X 1 , only today .  Rx: Dramamine.  URI Symptoms      The patient also presents with URI symptoms.  The patient reports dry cough, but denies nasal congestion, purulent nasal discharge, and earache.  The patient denies fever, dyspnea, and wheezing.  The patient denies  headache.  The patient denies the following risk factors for Strep sinusitis: unilateral facial pain, tooth pain, and tender adenopathy.    Allergies: 1)  Sulfa  Review of Systems General:  Denies chills and sweats. ENT:  Denies decreased hearing, ear discharge, and ringing in ears. Neuro:  Denies numbness, sensation of room spinning, and tingling.  Physical Exam  General:  in no acute distress; alert,appropriate and cooperative throughout examination Eyes:  No corneal or conjunctival inflammation noted. EOMI. Perrla. Field of . Vision grossly normal. Ears:  External ear exam shows no significant lesions or deformities.  Otoscopic examination reveals clear canals, tympanic membranes are intact bilaterally without bulging, retraction, inflammation or discharge. Hearing is grossly normal bilaterally. Nose:  External nasal examination shows no deformity or inflammation. Nasal mucosa are pink and moist without lesions or exudates. Mouth:  Oral mucosa and oropharynx without lesions or exudates.  Teeth in good repair. Lungs:  Normal respiratory effort,  chest expands symmetrically. Lungs are clear to auscultation, no crackles or wheezes. Heart:  Normal rate and regular rhythm. S1 and S2 normal without gallop, murmur, click, rub. S4 Neurologic:  alert & oriented X3, cranial nerves II-XII intact, strength normal in all extremities, sensation intact to light touch, DTRs symmetrical and normal, finger-to-nose normal, and Romberg negative.   Cervical Nodes:  No lymphadenopathy noted Axillary Nodes:  No palpable lymphadenopathy   Impression & Recommendations:  Problem # 1:  BENIGN POSITIONAL VERTIGO (ICD-386.11)  The following medications were removed from the medication list:    Meclizine Hcl 25 Mg Tabs (Meclizine hcl) .Marland Kitchen... 1 q 6-8 for dizziness  Problem # 2:  COUGH (ICD-786.2) improving; S/P RTI  Complete Medication List: 1)  Folic Acid 800 Mcg Tabs (Folic acid) .Marland Kitchen.. 1 by mouth once  daily 2)  Multi-vi-min  .... 2 by mouth once daily 3)  Esterol  .Marland Kitchen.. 1 by mouth once daily 4)  Cal-6-mg  .... 2 by mouth once daily (contains 400iu of vit d) 5)  Fish Oil  .Marland Kitchen.. 1 by mouth once daily 6)  Vitamin D 1000mg   .... Take 1 tablet by mouth two times a day 7)  Diazepam 2 Mg Tabs (Diazepam) .Marland Kitchen.. 1 every 8-12 hrs as needed  Patient Instructions: 1)  Go to WebMD for BPV for exercises .Avoid triggering positions. Diazepam  as needed . 2)  Drink as much NON dairy  fluid as you can tolerate for the next few days. 3)  Limit your Sodium (Salt) to less than 4 grams a day (slightly less than 1 teaspoon) to prevent fluid retention, swelling, or worsening or symptoms. Prescriptions: DIAZEPAM 2 MG TABS (DIAZEPAM) 1 every 8-12 hrs as needed  #20 x 0   Entered and Authorized by:   Marga Melnick MD   Signed by:   Marga Melnick MD on 10/01/2010   Method used:   Print then Give to Patient   RxID:   [id number]    Orders Added: 1)  Est. Patient Level IV [56213]

## 2010-12-04 NOTE — Assessment & Plan Note (Signed)
Summary: abdominal pain/kdc   Vital Signs:  Patient profile:   75 year old female Weight:      171 pounds BMI:     27.70 Temp:     98.9 degrees F oral Pulse rate:   80 / minute Resp:     16 per minute BP sitting:   124 / 72  (left arm) Cuff size:   large  Vitals Entered By: Shonna Chock (November 25, 2009 4:13 PM) CC: Abdominal pain off/on x 1 week, patient also with diarrhea Comments REVIEWED MED LIST, PATIENT AGREED DOSE AND INSTRUCTION CORRECT    CC:  Abdominal pain off/on x 1 week and patient also with diarrhea.  History of Present Illness: Onset 11/16/2009 as very loose stool with cramping & urgency intermittently his resolved w/o treatment  within 24 hrs.On   01/19-20  loose stool returned with urgency. Immodium AD relieved it  . PMH diverticulosis . She had cramps in lower abdomen last month X 3 days prompting rescheduling travel plans.  Allergies: 1)  Sulfa  Review of Systems General:  Complains of chills, fatigue, and fever; denies sweats; Temp to 98.9. GI:  Denies bloody stools and dark tarry stools. GU:  Denies discharge, dysuria, and hematuria.  Physical Exam  General:  well-nourished,in no acute distress; alert,appropriate and cooperative throughout examination Eyes:  No corneal or conjunctival inflammation noted.No icterus. Perrla. Mouth:  Oral mucosa and oropharynx without lesions or exudates.  Teeth in good repair. No pharyngeal erythema.   Lungs:  Normal respiratory effort, chest expands symmetrically. Lungs are clear to auscultation, no crackles or wheezes. Heart:  Normal rate and regular rhythm. S1 and S2 normal without gallop, murmur, click, rub. S4 Abdomen:  Bowel sounds positive,abdomen soft but tender in  LLQ & RUQ  & also RLQ without masses, organomegaly or hernias noted. Skin:  Intact without suspicious lesions or rashes Cervical Nodes:  No lymphadenopathy noted Axillary Nodes:  No palpable lymphadenopathy Psych:  memory intact for recent and remote,  normally interactive, and good eye contact.     Impression & Recommendations:  Problem # 1:  ABDOMINAL PAIN, RECURRENT (ICD-789.00)  Orders: UA Dipstick w/o Micro (manual) (64403) T-Culture, Urine ([id number])  Problem # 2:  CHANGE IN BOWELS (ICD-787.99) loose  Complete Medication List: 1)  Folic Acid 800 Mcg Tabs (Folic acid) .Marland Kitchen.. 1 by mouth once daily 2)  Multi-vi-min  .... 2 by mouth once daily 3)  Esterol  .Marland Kitchen.. 1 by mouth once daily 4)  Cal-6-mg  .... 2 by mouth once daily (contains 400iu of vit d) 5)  Vitamin D3 1000 Unit Caps (Cholecalciferol) .Marland Kitchen.. 1 by mouth once daily 6)  Fish Oil  .Marland Kitchen.. 1 by mouth once daily 7)  Ciprofloxacin Hcl 250 Mg Tabs (Ciprofloxacin hcl) .Marland Kitchen.. 1 two times a day 8)  Metronidazole 250 Mg Tabs (Metronidazole) .Marland Kitchen.. 1 three times a day  Patient Instructions: 1)  Avoid dairy & grease  until bowels are normal. Prescriptions: METRONIDAZOLE 250 MG TABS (METRONIDAZOLE) 1 three times a day  #21 x 0   Entered and Authorized by:   Marga Melnick MD   Signed by:   Marga Melnick MD on 11/25/2009   Method used:   Faxed to ...       [facility]  [id number][facility] (retail)       [address] [place], [place]  [address]       Ph: [phone] or [phone]  Fax: [phone]   RxID:   [id number] CIPROFLOXACIN HCL 250 MG TABS (CIPROFLOXACIN HCL) 1 two times a day  #14 x 0   Entered and Authorized by:   Marga Melnick MD   Signed by:   Marga Melnick MD on 11/25/2009   Method used:   Faxed to ...       [facility]  [phone][facility] (retail)       [address] [place], [place]  [address]       Ph: [phone] or [phone]       Fax: [phone]   RxID:   [id number]   Laboratory Results   Urine Tests    Routine Urinalysis   Color: yellow Appearance: Cloudy Glucose: negative   (Normal Range: Negative) Bilirubin: negative   (Normal Range: Negative) Ketone: negative   (Normal Range: Negative) Spec. Gravity: <1.005    (Normal Range: 1.003-1.035) Blood: negative   (Normal Range: Negative) pH: 6.5   (Normal Range: 5.0-8.0) Protein: negative   (Normal Range: Negative) Urobilinogen: 0.2   (Normal Range: 0-1) Nitrite: negative   (Normal Range: Negative) Leukocyte Esterace: trace   (Normal Range: Negative)

## 2010-12-04 NOTE — Letter (Signed)
Summary: [facility]  [facility]   Imported By: Lanelle Bal 08/23/2009 10:54:08  _____________________________________________________________________  External Attachment:    Type:   Image     Comment:   External Document

## 2010-12-04 NOTE — Letter (Signed)
Summary: Primary Care Consult Scheduled Letter  [facility]  [address] [place], [place] [address]   Phone: [phone]  Fax: [phone]      04/18/2009 MRN: [id number]  [facility] [address] [place], [place]  [address]    Dear Ms. Feggins,      We have scheduled an appointment for you.  At the recommendation of Dr. Marga Melnick, we have scheduled you for an Echo on 04-29-09 at 4:00pm.  Their address is [address] [place], [place] [address]. The office phone number is [phone].  If this appointment day and time is not convenient for you, please feel free to call the office of the doctor you are being referred to at the number listed above and reschedule the appointment.     It is important for you to keep your scheduled appointments. We are here to make sure you are given good patient care. If you have questions or you have made changes to your appointment, please notify us at  [phone], ask for Renee.    Thank you,  Patient Care Coordinator [facility]

## 2010-12-04 NOTE — Assessment & Plan Note (Signed)
Summary: OLD PT OF DR HOOPER , [place] TO SEE PER DR Agnieszka Newhouse.[facility]   Vital Signs:  Patient Profile:   75 Years Old Female Weight:      171.13 pounds Pulse rate:   64 / minute Pulse rhythm:   regular BP sitting:   130 / 86  (left arm) Cuff size:   regular  Pt. in pain?   no  Vitals Entered By: Wendall Stade (April 14, 2007 1:50 PM)                Chief Complaint:  to re establish care pt had moved away and just moved back.  History of Present Illness: URI X 3 since 4/08; presently X 10 days. Rxed X 2 @ Urgent Care with Avelox .The 2nd Rx not completed.  Acute Visit History:      The patient complains of cough.  The cough interferes with her sleep.  The character of the cough is described as nonproductive.  There is no history of wheezing, shortness of breath, or cyanosis associated with her cough.         Current Allergies (reviewed today): No known allergies  Updated/Current Medications (including changes made in today's visit):  CYMBALTA 30 MG  CPEP (DULOXETINE HCL) 1 by mouth qd ZITHROMAX 250 MG TAB (AZITHROMYCIN) Take two (2 ) tablets day one, then one (1) tablet a day for four (4) more days   Past Medical History:    thyroid nodules,last [place] 03/09/05  Past Surgical History:    RULobectomy 10/2001;no Radiation or chemo    Risk Factors:  Tobacco use:  quit    Year quit:  1980 Alcohol use:  yes    Type:  3X/week Exercise:  no   Review of Systems  General      Denies chills, fatigue, fever, loss of appetite, malaise, sleep disorder, sweats, weakness, and weight loss.  ENT      Complains of nasal congestion, postnasal drainage, and sinus pressure.      Denies decreased hearing, difficulty swallowing, ear discharge, earache, hoarseness, nosebleeds, ringing in ears, and sore throat.      no purulence  CV      Denies bluish discoloration of lips or nails, chest pain or discomfort, difficulty breathing at night, difficulty breathing while lying down, fainting,  fatigue, leg cramps with exertion, lightheadness, near fainting, palpitations, shortness of breath with exertion, swelling of feet, swelling of hands, and weight gain.  Resp      see HPI  GI      Complains of constipation.      from Cymbalta  MS      Complains of mid back pain.      from cough  Derm      Denies changes in color of skin, changes in nail beds, dryness, excessive perspiration, flushing, hair loss, insect bite(s), itching, lesion(s), poor wound healing, and rash.      no hair or skin changes  Endo      Denies cold intolerance, excessive hunger, excessive thirst, excessive urination, heat intolerance, polyuria, and weight change.  Allergy      Denies hives or rash, itching eyes, persistent infections, seasonal allergies, and sneezing.   Physical Exam  General:     Well-developed,well-nourished,in no acute distress; alert,appropriate and cooperative throughout examination Ears:     External ear exam shows no significant lesions or deformities.  Otoscopic examination reveals clear canals, tympanic membranes are intact bilaterally without bulging, retraction, inflammation or discharge. Hearing is grossly  normal bilaterally. Nose:     External nasal examination shows no deformity or inflammation. Nasal mucosa are pink and moist without lesions or exudates. Mouth:     Oral mucosa and oropharynx without lesions or exudates.  Teeth in good repair. Neck:     small asymmetric R > L;firm Lungs:     Normal respiratory effort, chest expands symmetrically. Lungs are clear to auscultation, no crackles or wheezes. Heart:     S4 with slurring Neurologic:     DTRs symmetrical and normal.   Skin:     Intact without suspicious lesions or rashes    Impression & Recommendations:  Problem # 1:  BRONCHITIS-ACUTE (ICD-466.0)  Her updated medication list for this problem includes:    Zithromax 250 Mg Tab (Azithromycin) .Marland Kitchen... Take two (2 ) tablets day one, then one (1) tablet a  day for four (4) more days Note: she declined cough syrup Orders: TLB-CBC Platelet - w/Differential (85025-CBCD) CXR- 2view (CXR)   Problem # 2:  GOITER, NODULAR (ICD-241.9)  Orders: TLB-TSH (Thyroid Stimulating Hormone) (84443-TSH) TLB-T3, Free (Triiodothyronine) (84481-T3FREE) TLB-T4 (Thyrox), Free [phone]) Ultrasound (Ultrasound)   Medications Added to Medication List This Visit: 1)  Cymbalta 30 Mg Cpep (Duloxetine hcl) .Marland Kitchen.. 1 by mouth qd 2)  Zithromax 250 Mg Tab (Azithromycin) .... Take two (2 ) tablets day one, then one (1) tablet a day for four (4) more days

## 2010-12-04 NOTE — Assessment & Plan Note (Signed)
Summary: 1 wk F/U/hea   Vital Signs:  Patient profile:   75 year old female Weight:      165.25 pounds BMI:     26.77 Temp:     97.9 degrees F oral Pulse rate:   100 / minute Pulse rhythm:   regular Resp:     18 per minute BP sitting:   107 / 70  (right arm) Cuff size:   regular  Vitals Entered By: Mervin Kung CMA (January 03, 2010 2:17 PM)  CC: room 4  1 week f/u Comments Lower  lip hurts.  Still has fatigue.   CC:  room 4  1 week f/u.  History of Present Illness: Maria [name] is a 75 year old female who presents today to follow up on her bronchitis and recent influenza.  Last visit rapid flu test was positive for Flu A.  She was treated with Tamiflu and zithromax.  Patient's cough is improved.  Denies fevers.    Allergies: 1)  Sulfa  Physical Exam  General:  white female, awake, alert and in NAD Head:  Normocephalic and atraumatic without obvious abnormalities. No apparent alopecia or balding. Eyes:  PERRLA Neck:  No deformities, masses, or tenderness noted. Lungs:  R sided expiratory wheeze, resolved after neb treatment in office Heart:  Normal rate and regular rhythm. S1 and S2 normal without gallop, murmur, click, rub or other extra sounds.   Impression & Recommendations:  Problem # 1:  INFLUENZA (ICD-487.8) Assessment Improved Clinically resolved.  Monitor.  Problem # 2:  BRONCHITIS, ACUTE WITH MILD BRONCHOSPASM (ICD-466.0) Assessment: Improved Cough is improved, but patient continues to wheeze.  Wheeze resolved after nebulizer. She declines by mouth prednisone, but is aggreeable to advair.  Will treat with advair- sample given to patient. The following medications were removed from the medication list:    Zithromax Z-pak 250 Mg Tabs (Azithromycin) .Marland Kitchen... 2 tabs by mouth today, then one tablet by mouth daily x 4 more days Her updated medication list for this problem includes:    Proair Hfa 108 (90 Base) Mcg/act Aers (Albuterol sulfate) .Marland Kitchen... 2 puffs every 6  hours as needed for wheezing    Advair Diskus 250-50 Mcg/dose Aepb (Fluticasone-salmeterol) ..... One puff twice daily  Complete Medication List: 1)  Folic Acid 800 Mcg Tabs (Folic acid) .Marland Kitchen.. 1 by mouth once daily 2)  Multi-vi-min  .... 2 by mouth once daily 3)  Esterol  .Marland Kitchen.. 1 by mouth once daily 4)  Cal-6-mg  .... 2 by mouth once daily (contains 400iu of vit d) 5)  Fish Oil  .Marland Kitchen.. 1 by mouth once daily 6)  Proair Hfa 108 (90 Base) Mcg/act Aers (Albuterol sulfate) .... 2 puffs every 6 hours as needed for wheezing 7)  Advair Diskus 250-50 Mcg/dose Aepb (Fluticasone-salmeterol) .... One puff twice daily Prescriptions: ADVAIR DISKUS 250-50 MCG/DOSE AEPB (FLUTICASONE-SALMETEROL) one puff twice daily  #1 x 0   Entered and Authorized by:   Lemont Fillers FNP   Signed by:   Lemont Fillers FNP on 01/03/2010   Method used:   Electronically to        [facility]  [phone]* (retail)       [address] [place], [place]  [address]       Ph: [phone] or [phone]       Fax: [phone]   RxID:   [id number]   Current Allergies (reviewed today): SULFA

## 2010-12-04 NOTE — Letter (Signed)
Summary: Results Follow up Letter  [name]  [address] [place], [place] [address]   Phone: [phone]  Fax: [phone]    04/15/2009 MRN: [id number]  [facility] [address] [place], [place]  [address]  Dear Maria [name],  The following are the results of your recent test(s):  Test         Result    Pap Smear:        Normal _____  Not Normal _____ Comments: ______________________________________________________ Cholesterol: LDL(Bad cholesterol):         Your goal is less than:         HDL (Good cholesterol):       Your goal is more than: Comments:  ______________________________________________________ Mammogram:        Normal _____  Not Normal _____ Comments:  ___________________________________________________________________ Hemoccult:        Normal _____  Not normal _______ Comments:    _____________________________________________________________________ Other Tests: PLEASE SEE ADDITIONAL LABS DONE ON 04/10/2009    We routinely do not discuss normal results over the telephone.  If you desire a copy of the results, or you have any questions about this information we can discuss them at your next office visit.   Sincerely,

## 2010-12-04 NOTE — Letter (Signed)
Summary: Results Follow up Letter  [facility]  [address] [place], [place] [address]   Phone: [phone]  Fax: [phone]    10/24/2008 MRN: [id number]  [facility] [address] [place], [place]  [address]  Dear Ms. Harb,  The following are the results of your recent test(s):  Test         Result    Pap Smear:        Normal _____  Not Normal _____ Comments: ______________________________________________________ Cholesterol: LDL(Bad cholesterol):         Your goal is less than:         HDL (Good cholesterol):       Your goal is more than: Comments:  ______________________________________________________ Mammogram:        Normal _____  Not Normal _____ Comments:  ___________________________________________________________________ Hemoccult:        Normal _____  Not normal _______ Comments:    _____________________________________________________________________ Other Tests: Please see attached labs done on 10/23/2008 and appointment card to recheck labs in 3 months. If given appointment date and time infers with schedule please call to reschedule    We routinely do not discuss normal results over the telephone.  If you desire a copy of the results, or you have any questions about this information we can discuss them at your next office visit.   Sincerely,

## 2010-12-04 NOTE — Letter (Signed)
Summary: Results Follow up Letter  [facility]  [address] [place], [place] [address]   Phone: [phone]  Fax: [phone]    04/12/2009 MRN: [id number]  [facility] [address] [place], [place]  [address]  Dear Ms. Eyerman,  The following are the results of your recent test(s):  Test         Result    Pap Smear:        Normal _____  Not Normal _____ Comments: ______________________________________________________ Cholesterol: LDL(Bad cholesterol):         Your goal is less than:         HDL (Good cholesterol):       Your goal is more than: Comments:  ______________________________________________________ Mammogram:        Normal _____  Not Normal _____ Comments:  ___________________________________________________________________ Hemoccult:        Normal _____  Not normal _______ Comments:    _____________________________________________________________________ Other Tests: Please see attached labs done on 04/10/2009    We routinely do not discuss normal results over the telephone.  If you desire a copy of the results, or you have any questions about this information we can discuss them at your next office visit.   Sincerely,

## 2010-12-04 NOTE — Letter (Signed)
Summary: Results Follow up Letter  [facility]  [address] [place], [place] [address]   Phone: [phone]  Fax: [phone]    06/23/2007 MRN: [id number]  [facility] [address] [place], [place]  [address]  Dear Maria [name],  The following are the results of your recent test(s):  Test         Result    Pap Smear:        Normal _____  Not Normal _____ Comments: ______________________________________________________ Cholesterol: LDL(Bad cholesterol):         Your goal is less than:         HDL (Good cholesterol):       Your goal is more than: Comments:  ______________________________________________________ Mammogram:        Normal _____  Not Normal _____ Comments:  ___________________________________________________________________ Hemoccult:        Normal _____  Not normal _______ Comments:    _____________________________________________________________________ Other Tests:  Please see attached results and comments   We routinely do not discuss normal results over the telephone.  If you desire a copy of the results, or you have any questions about this information we can discuss them at your next office visit.   Sincerely,

## 2010-12-04 NOTE — Progress Notes (Signed)
Summary: lab orders  Phone Note Call from Patient   Caller: Patient Summary of Call: pt states that she spoke with you over the weekend and was advise to come in thursday morning for labs fasting. pt has no pend lab appt. pt would also like to have vitamin d levels checked along with other labs. pls advise on labs order............Marland KitchenFelecia Deloach CMA  January 21, 2010 4:26 PM   Follow-up for Phone Call        Vit D (627.9); K+( 276.8) Follow-up by: Marga Melnick MD,  January 21, 2010 6:13 PM  Additional Follow-up for Phone Call Additional follow up Details #1::        appt schedule, lab orders added..........Marland KitchenFelecia Deloach CMA  January 22, 2010 9:00 AM

## 2010-12-04 NOTE — Assessment & Plan Note (Signed)
Summary: headache/dizzy/kdc   Vital Signs:  Patient profile:   75 year old female Weight:      168.4 pounds O2 Sat:      97 % Temp:     98.2 degrees F oral Pulse rate:   99 / minute Resp:     16 per minute BP supine:   132 / 70  (left arm) BP sitting:   126 / 70  (left arm) BP standing:   116 / 68  (left arm) Cuff size:   large  Vitals Entered By: Shonna Chock (January 17, 2010 12:04 PM) CC: Headache and dizziness, Patient was Dx with the flu in [place] and never fully recovered. Last night patient woke up and was shaking and freezing. Comments REVIEWED MED LIST, PATIENT AGREED DOSE AND INSTRUCTION CORRECT  **TOOK ADVIL FOR HEADACHE** Pulse Supine: 96, Pulse Standing: 101   CC:  Headache and dizziness and Patient was Dx with the flu in [place] and never fully recovered. Last night patient woke up and was shaking and freezing.Marland Kitchen  History of Present Illness: She awoke 12 : 30 am this am  with shaking chills with diffuse myalgias & joint pain lasting ?Marland Kitchen No treatment. She fell back to sleep  after an indefinite period of time & slept until 8:30 am. Now some nausea & intermittent  mild waves of dizziness . BPV symptoms started after getting up off  Acupuncture table  03/14 post treatment. BPV symptoms recurred  last night but have resolved this am.  Allergies: 1)  Sulfa  Review of Systems General:  Complains of chills, fatigue, fever, and malaise; denies sweats. Eyes:  Denies discharge, double vision, eye pain, red eye, and vision loss-both eyes. ENT:  Complains of postnasal drainage; denies nasal congestion and sinus pressure; Minor frontal headache w/o facial pain or purulence. CV:  Complains of lightheadness; denies near fainting and palpitations. Resp:  Complains of shortness of breath; denies cough, sputum productive, and wheezing; Minor SOB. GI:  Denies diarrhea. GU:  Denies discharge, dysuria, and hematuria. MS:  Complains of joint pain and muscle aches; denies joint redness,  joint swelling, and muscle weakness. Derm:  Denies lesion(s) and rash. Neuro:  Denies brief paralysis, numbness, sensation of room spinning, tingling, and weakness.  Physical Exam  General:  Appears tired,in no acute distress; alert,appropriate and cooperative throughout examination Eyes:  No corneal or conjunctival inflammation noted. EOMI. Perrla. Field of . Vision grossly normal. No nystagmus Ears:  External ear exam shows no significant lesions or deformities.  Otoscopic examination reveals clear canals, tympanic membranes are intact bilaterally without bulging, retraction, inflammation or discharge. Hearing is grossly normal bilaterally. Slight lateralization to L. Air conduction better than bone conduction bilaterally Mouth:  Oral mucosa and oropharynx without lesions or exudates.  Teeth in good repair. No tongue deviation Heart:  Normal rate and regular rhythm. S1 and S2 normal without gallop, murmur, click, rub. S4 Pulses:  R and L carotid  pulses are full and equal bilaterally w/o bruits Neurologic:  alert & oriented X3, cranial nerves II-XII intact, strength normal in all extremities, sensation intact to light touch, gait normal, DTRs symmetrical and normal, finger-to-nose normal, and Romberg negative.   Skin:  Intact without suspicious lesions or rashes. Mild increased veins over maxilla Cervical Nodes:  No lymphadenopathy noted Axillary Nodes:  No palpable lymphadenopathy Psych:  memory intact for recent and remote, normally interactive, and good eye contact.     Impression & Recommendations:  Problem # 1:  BENIGN POSITIONAL  VERTIGO (ICD-386.11)  Orders: Venipuncture (03474)  Her updated medication list for this problem includes:    Meclizine Hcl 25 Mg Tabs (Meclizine hcl) .Marland Kitchen... 1 q 6-8 for dizziness  Problem # 2:  CHILLS WITHOUT FEVER (ICD-780.64)  resolved  Orders: Venipuncture ([address]) TLB-CBC Platelet - w/Differential (85025-CBCD) TLB-Renal Function Panel  (80069-RENAL) T-Urine Culture (Spectrum Order) ([id number])  Problem # 3:  MYALGIA (ICD-729.1)  now resolved  Orders: Venipuncture (51884)  Complete Medication List: 1)  Folic Acid 800 Mcg Tabs (Folic acid) .Marland Kitchen.. 1 by mouth once daily 2)  Multi-vi-min  .... 2 by mouth once daily 3)  Esterol  .Marland Kitchen.. 1 by mouth once daily 4)  Cal-6-mg  .... 2 by mouth once daily (contains 400iu of vit d) 5)  Fish Oil  .Marland Kitchen.. 1 by mouth once daily 6)  Advair Diskus 250-50 Mcg/dose Aepb (Fluticasone-salmeterol) .... One puff twice daily 7)  Vitamin D 1000mg   .... Take 1 tablet by mouth two times a day 8)  Meclizine Hcl 25 Mg Tabs (Meclizine hcl) .Marland Kitchen.. 1 q 6-8 for dizziness  Patient Instructions: 1)  Go to Web MD for Benign Positional Vertigo . Isometrics pre standing because of  positional BP drop noted 2)  Limit your Sodium (Salt) to less than 4 grams a day (slightly less than 1 teaspoon) to prevent fluid retention, swelling, or worsening or symptoms. Prescriptions: MECLIZINE HCL 25 MG TABS (MECLIZINE HCL) 1 q 6-8 for dizziness  #20 x 0   Entered and Authorized by:   Marga Melnick MD   Signed by:   Marga Melnick MD on 01/17/2010   Method used:   Print then Give to Patient   RxID:   [id number]

## 2010-12-04 NOTE — Letter (Signed)
Summary: [facility]  [facility]   Imported By: Lanelle Bal 10/17/2008 14:47:14  _____________________________________________________________________  External Attachment:    Type:   Image     Comment:   External Document

## 2010-12-04 NOTE — Letter (Signed)
Summary: Results Follow up Letter  [facility]  [address] [place], [place] [address]   Phone: [phone]  Fax: [phone]    04/19/2009 MRN: [id number]  [facility] [address] [place], [place]  [address]  Dear Ms. Nemitz,  The following are the results of your recent test(s):  Test         Result    Pap Smear:        Normal _____  Not Normal _____ Comments: ______________________________________________________ Cholesterol: LDL(Bad cholesterol):         Your goal is less than:         HDL (Good cholesterol):       Your goal is more than: Comments:  ______________________________________________________ Mammogram:        Normal _____  Not Normal _____ Comments:  ___________________________________________________________________ Hemoccult:        Normal _____  Not normal _______ Comments:    _____________________________________________________________________ Other Tests: PLEASE SEE ATTACHED CHEST X-RAY DONE ON 04/18/2009    We routinely do not discuss normal results over the telephone.  If you desire a copy of the results, or you have any questions about this information we can discuss them at your next office visit.   Sincerely,

## 2010-12-04 NOTE — Miscellaneous (Signed)
Summary: Waiver of Liability/[place] [employer]  Waiver of Liability/[facility] [place]   Imported By: Lanelle Bal 01/25/2009 09:00:16  _____________________________________________________________________  External Attachment:    Type:   Image     Comment:   External Document

## 2010-12-04 NOTE — Assessment & Plan Note (Signed)
Summary: 1 week f/u / tf   Vital Signs:  Patient profile:   75 year old female Weight:      165.75 pounds BMI:     26.85 Temp:     97.1 degrees F oral Pulse rate:   84 / minute Pulse rhythm:   regular Resp:     16 per minute BP sitting:   120 / 80  (right arm) Cuff size:   regular  Vitals Entered By: Mervin Kung CMA (January 10, 2010 2:12 PM) CC: room 5  1 week follow up   CC:  room 5  1 week follow up.  History of Present Illness: Maria [name] is a 75 year old female who presents today for follow up of her influenza and Bronchitis wheezing. She was initially seen on 2/25 , and was treated with Tamiflu and zithromax.  She then followed up on 3/4, and was noted to be wheezing.  She declined oral steroids, but was agreeable to advair.   Notes that she still has some chest congestion- but this is non-productive. Overall feels much better.  Appetite and energy are not quite at baseline yet.   Allergies: 1)  Sulfa  Physical Exam  General:  Well-developed,well-nourished,in no acute distress; alert,appropriate and cooperative throughout examination Head:  Normocephalic and atraumatic without obvious abnormalities. No apparent alopecia or balding. Ears:  slight bilateral serous effusion without bulging or erythema Mouth:  Oral mucosa and oropharynx without lesions or exudates.  Teeth in good repair. Neck:  No deformities, masses, or tenderness noted. Lungs:  Normal respiratory effort, chest expands symmetrically. Lungs are clear to auscultation, no crackles or wheezes. Heart:  Normal rate and regular rhythm. S1 and S2 normal without gallop, murmur, click, rub or other extra sounds.   Impression & Recommendations:  Problem # 1:  BRONCHITIS, ACUTE WITH MILD BRONCHOSPASM (ICD-466.0) Assessment Improved No wheezing today, clinically improved.  Advised patient to continue advair for at least a few more weeks.  Continue pro-air as needed. The following medications were removed from the  medication list:    Proair Hfa 108 (90 Base) Mcg/act Aers (Albuterol sulfate) .Marland Kitchen... 2 puffs every 6 hours as needed for wheezing Her updated medication list for this problem includes:    Advair Diskus 250-50 Mcg/dose Aepb (Fluticasone-salmeterol) ..... One puff twice daily  Complete Medication List: 1)  Folic Acid 800 Mcg Tabs (Folic acid) .Marland Kitchen.. 1 by mouth once daily 2)  Multi-vi-min  .... 2 by mouth once daily 3)  Esterol  .Marland Kitchen.. 1 by mouth once daily 4)  Cal-6-mg  .... 2 by mouth once daily (contains 400iu of vit d) 5)  Fish Oil  .Marland Kitchen.. 1 by mouth once daily 6)  Advair Diskus 250-50 Mcg/dose Aepb (Fluticasone-salmeterol) .... One puff twice daily 7)  Vitamin D 1000mg   .... Take 1 tablet by mouth two times a day  Patient Instructions: 1)  Please schedule an appointment with Dr. Alwyn Ren for a complete physical. 2)  Call if cough, fever, wheezing.   3)  I am glad you are feeling better.  Current Allergies (reviewed today): SULFA

## 2010-12-04 NOTE — Progress Notes (Signed)
  Phone Note Outgoing Call   Summary of Call: Please call patient and arrange a follow up visit in 1 week  Please also verify that advair was sent to her proper pharmacy Initial call taken by: Lemont Fillers FNP,  January 03, 2010 10:04 PM     Appended Document:  Spoke with pt. and scheduled f/u for 3/11 @ 2:15. Rx. was sent to correct pharm. Pt will pick it up.

## 2010-12-04 NOTE — Progress Notes (Signed)
Summary: **Recent Labs**  Phone Note Outgoing Call Call back at [facility] Phone [phone]   Call placed by: Shonna Chock,  April 12, 2009 12:24 PM Call placed to: Patient Summary of Call: LEFT MESSAGE ON MACHINE INFORMING PATIENT OF HER RESULTS:  Excellent reports; no anemia ,elevated white count or pancreatitis. [place] of abdomen if symptoms persist. Hopp  PATIENT TO CALL ON MONDAY IF ANY QUESTIONS OR CONCERNS, COPY OF LABS TO BE MAILED./Chrae Malloy  April 12, 2009 12:25 PM

## 2010-12-04 NOTE — Letter (Signed)
Summary:  Cancer Center  [facility]   Imported By: Lanelle Bal 09/30/2010 12:27:01  _____________________________________________________________________  External Attachment:    Type:   Image     Comment:   External Document

## 2010-12-04 NOTE — Progress Notes (Signed)
Summary: Appointment Due to Discuss labs  Phone Note Outgoing Call   Call placed by: Shonna Chock,  January 29, 2009 11:17 AM Call placed to: Patient Summary of Call: Left message on machine informing patient appointment needed to F/U on labs. Patient was informed to bring all meds and drug coverage list./Chrae [facility]  January 29, 2009 11:19 AM

## 2010-12-04 NOTE — Progress Notes (Signed)
Summary: lab order - dr Stacy Sailer  Phone Note Call from Patient   Caller: Patient Summary of Call: need lab order - lab is scheduled for [phone] Initial call taken by: Okey Regal Spring,  June 06, 2007 3:23 PM  Follow-up for Phone Call        carol, it is a fasting glucose, lipids, GOT,GPT,BUN,creat,potassium and the codes are 272.4, 995.2 Follow-up by: Wendall Stade,  June 13, 2007 12:17 PM    Additional Follow-up for Phone Call Additional follow up Details #2::    waiting on chart ..................................................................Marland KitchenShary Decamp  June 10, 2007 12:37 PM

## 2010-12-04 NOTE — Assessment & Plan Note (Signed)
Summary: surgical clearence/kdc   Vital Signs:  Patient profile:   75 year old female Height:      66 inches Weight:      173 pounds Temp:     98.1 degrees F oral Pulse rate:   72 / minute Resp:     16 per minute BP sitting:   118 / 76  (right arm)  Vitals Entered By: Ardyth Man (April 26, 2009 11:13 AM) CC: surgical clearance, Pre-op Evaluation Is Patient Diabetic? No Pain Assessment Patient in pain? no       Have you ever been in a relationship where you felt threatened, hurt or afraid?No   Does patient need assistance? Functional Status Self care Ambulation Normal   CC:  surgical clearance and Pre-op Evaluation.  History of Present Illness:  Pre-Op Evaluation: Dr Noland Fordyce plans bladder tacking , sling procedure & pessary removal for incontinence.    The patient denies respiratory symptoms, GI bleeding, chest pain, edema, PND, heavy ETOH use, and smoking.  Patient has no history of acute or recent MI, unstable or severe angina, decompensated CHF, high grade AV block, symptomatic ventricular arrhythmia, and severe valvular disease.  Patient has no history of mild angina(m), previous MI(m), compensated CHF(m), diabetes(m), renal insufficiency(m), abnormal ECG(l), and low functional capacity(l).  There is no history of antiplatelet agents, chronic steroids, warfarin, diabetes meds, antianginal meds, and bleeding disorder.  PMH of difficulty with post Anesthesia arousal & nausea & vomiting. All symptoms documented @ last OV have resolved completely. Labs & Joan Mayans reviewed & copies provided.  Allergies: 1)  Sulfa  Review of Systems GI:  Denies abdominal pain, bloody stools, constipation, dark tarry stools, diarrhea, and indigestion. Derm:  Denies itching, lesion(s), and rash.  Physical Exam  General:  well-nourished,in no acute distress; alert,appropriate and cooperative throughout examination Lungs:  Normal respiratory effort, chest expands symmetrically. Lungs  are clear to auscultation, no crackles or wheezes. Heart:  Normal rate and regular rhythm. S1 and S2 normal without gallop, murmur, click, rub . S4 with slurring Abdomen:  Bowel sounds positive,abdomen soft and non-tender without masses, organomegaly or hernias noted. Pulses:  R and L carotid,radial,dorsalis pedis and posterior tibial pulses are full and equal bilaterally Extremities:  No clubbing, cyanosis, edema. Neurologic:  alert & oriented X3.   Skin:  Intact without suspicious lesions or rashes Cervical Nodes:  No lymphadenopathy noted Axillary Nodes:  No palpable lymphadenopathy Psych:  memory intact for recent and remote, normally interactive, and good eye contact.     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 1 by mouth once daily 2)  Folic Acid 800 Mcg Tabs (Folic acid) .Marland Kitchen.. 1 by mouth once daily 3)  Multi-vi-min  .... 2 by mouth once daily 4)  Esterol  .Marland Kitchen.. 1 by mouth once daily 5)  Cal-6-mg  .... 2 by mouth once daily (contains 400iu of vit d) 6)  Vitamin D3 1000 Unit Caps (Cholecalciferol) .Marland Kitchen.. 1 by mouth once daily 7)  Fish Oil  .Marland Kitchen.. 1 by mouth once daily 8)  Red Yeast Rice 600 Mg Tabs (Red yeast rice extract) .Marland Kitchen.. 1 by mouth once daily  9)  Coq10 100 Mg Caps (Coenzyme q10) .Marland Kitchen.. 1 by mouth once daily 10)  Hydroxyzine Pamoate 25 Mg Caps (Hydroxyzine pamoate) .Marland Kitchen.. 1-2 q 6 hrs as needed itching 11)  Ranitidine Hcl 150 Mg Caps (Ranitidine hcl) .Marland Kitchen.. 1 two times a day pre meals  Patient Instructions: 1)  Share records with Anesthesiologist & Dr Ernestina Penna.

## 2010-12-04 NOTE — Assessment & Plan Note (Signed)
Summary: acute only for sinus and chest congestion//ph   Vital Signs:  Patient Profile:   75 Years Old Female Weight:      176.38 pounds Temp:     97.2 degrees F oral Pulse rate:   64 / minute Pulse rhythm:   regular Resp:     20 per minute BP sitting:   118 / 72  (left arm) Cuff size:   large  Pt. in pain?   no  Vitals Entered By: Wendall Stade (November 29, 2007 2:09 PM)                  Chief Complaint:  Cough.  History of Present Illness:  The pt has  been sick two weeks, started with sore throat, sneezing, runny nose, cough not productive, fatigue, lock of appetite, malaise, not  taking any otc,   Cough      This is a 75 year old woman who presents with Cough.  [place]cca green phlegm; minor temp elevation. No headaches , nasal purulence  or facial pain.  The patient reports non-productive cough, exertional dyspnea, and malaise, but denies productive cough, pleuritic chest pain, shortness of breath, wheezing, fever, and hemoptysis.  Associated symtpoms include cold/URI symptoms, nasal congestion, and chronic rhinitis.  The patient denies the following symptoms: sore throat, weight loss, acid reflux symptoms, and peripheral edema.  Last CXray 7/08, NAD.  Current Allergies (reviewed today): SULFA  Past Surgical History:    RULobectomy 10/2001;no Radiation or chemo, Dr Edwyna Shell    Colonoscopy : diverticulosis      Physical Exam  General:     Well-developed,well-nourished,in no acute distress; alert,appropriate and cooperative throughout examination Ears:     External ear exam shows no significant lesions or deformities.  Otoscopic examination reveals clear canals, tympanic membranes are intact bilaterally without bulging, retraction, inflammation or discharge. Hearing is grossly normal bilaterally. Nose:     External nasal examination shows no deformity or inflammation. Nasal mucosa are pink and moist without lesions or exudates. Mouth:     Oral mucosa and oropharynx  without lesions or exudates.  Teeth in good repair. Lungs:     Normal respiratory effort, chest expands symmetrically. Lungs are clear to auscultation, no crackles or wheezes. Heart:     Normal rate and regular rhythm. S1 and S2 normal without gallop, murmur, click, rub. S4. Cervical Nodes:     No lymphadenopathy noted Axillary Nodes:     No palpable lymphadenopathy    Impression & Recommendations:  Problem # 1:  BRONCHITIS-ACUTE (ICD-466.0)  The following medications were removed from the medication list:    Cipro 500 Mg Tabs (Ciprofloxacin hcl) .Marland Kitchen... Take one (1) by mouth twice a day  Her updated medication list for this problem includes:    Azithromycin 250 Mg Tabs (Azithromycin) .Marland Kitchen... As per pack    Promethazine-codeine 6.25-10 Mg/58ml Syrp (Promethazine-codeine) .Marland Kitchen... 1 tsp q 6 hrs prn   Problem # 2:  NEOP, MALIGNANT, LUNG, UPPER LOBE (ICD-162.3)  Complete Medication List: 1)  Azithromycin 250 Mg Tabs (Azithromycin) .... As per pack 2)  Promethazine-codeine 6.25-10 Mg/17ml Syrp (Promethazine-codeine) .Marland Kitchen.. 1 tsp q 6 hrs prn   Patient Instructions: 1)  Drink as much fluid as you can tolerate for the next few days. Neti pot once daily as needed.    Prescriptions: PROMETHAZINE-CODEINE 6.25-10 MG/5ML  SYRP (PROMETHAZINE-CODEINE) 1 tsp q 6 hrs prn  #120cc x 0   Entered and Authorized by:   Marga Melnick MD   Signed by:  Marga Melnick MD on 11/29/2007   Method used:   Print then Give to Patient   RxID:   [id number] AZITHROMYCIN 250 MG  TABS (AZITHROMYCIN) as per pack  #1 pack x 0   Entered and Authorized by:   Marga Melnick MD   Signed by:   Marga Melnick MD on 11/29/2007   Method used:   Print then Give to Patient   RxID:   [id number]  ]

## 2010-12-04 NOTE — Assessment & Plan Note (Signed)
Summary: coughing/kdc   Vital Signs:  Patient profile:   75 year old female Weight:      167 pounds BMI:     27.05 O2 Sat:      94 % Temp:     98.4 degrees F oral Pulse rate:   119 / minute Pulse rhythm:   regular Resp:     16 per minute BP sitting:   128 / 86  (right arm) Cuff size:   regular  Vitals Entered By: Mervin Kung CMA (December 27, 2009 3:55 PM) CC: room 6  Possible sinus infection? Comments Cough and runny nose since Wednesday.  Headache, dizziness and nausea since Thursday.   CC:  room 6  Possible sinus infection?.  History of Present Illness: Maria [name] is a 75 year old female who presents today with c/o nasal congestion which started on 2/23.  Notes + nausea, + HA, + muscle aches, + fever up to 101.3 this AM.  Has not tried any OTC meds.  Notes not eating well, but has started drinking liquids to avoid dehydration.    Allergies: 1)  Sulfa  Past History:  Past Medical History: Last updated: 10/23/2008 NEOPLASM, MALIGNANT, LUNG, NON-SMALL CELL (ICD-162.9) DIVERTICULOSIS (ICD-562.10) GOITER, NODULAR (ICD-241.9), [place] of thyroid stable 7/08 Osteopenia, Vit D deficiency , [facility]  Past Surgical History: Last updated: 10/23/2008 RULobectomy 10/2001;no Radiation or chemo, Dr Edwyna Shell Colonoscopy : diverticulosis , 2009,Dr Juanda Chance Hysterectomy(no BSO) for dysfunctional Tonsillectomy  Family History: Last updated: 10/23/2008 Father: CAD,colon CA Mother: COPD Siblings: bro lymphoma (? mycosis fungoides); P aunt lung CA; M uncle lung CA; MGF lung CA  Social History: Last updated: 10/23/2008 Retired Alcohol use-yes Regular exercise-yes  Risk Factors: Exercise: yes (10/23/2008)  Risk Factors: Smoking Status: quit (04/14/2007)  Family History: Reviewed history from 10/23/2008 and no changes required. Father: CAD,colon CA Mother: COPD Siblings: bro lymphoma (? mycosis fungoides); P aunt lung CA; M uncle lung CA; MGF lung CA  Social  History: Reviewed history from 10/23/2008 and no changes required. Retired Alcohol use-yes Regular exercise-yes  Physical Exam  General:  Tired appearing white female, NAD Head:  Normocephalic and atraumatic without obvious abnormalities. No apparent alopecia or balding. Eyes:  PERRLA Ears:  R TM with yellow fluid noted behind membrane Lungs:  R sided expiratory wheeze, breathing even non-labored, no increased WOB Heart:  Normal rate and regular rhythm. S1 and S2 normal without gallop, murmur, click, rub or other extra sounds.   Impression & Recommendations:  Problem # 1:  INFLUENZA (ICD-487.8) Assessment New Will plan treatment with tamiflu, tylenol, rest, hydration.  Rapid flu + flu A.  I spent  25 minutes reviewing chart, examining patient and coordinating her care.  Problem # 2:  BRONCHITIS, ACUTE WITH MILD BRONCHOSPASM (ICD-466.0) Assessment: New Will plan treatment with z-pack given cough/green sputum and fever and hx of RUL resection.   Add proair as needed for wheezing.  The following medications were removed from the medication list:    Ciprofloxacin Hcl 250 Mg Tabs (Ciprofloxacin hcl) .Marland Kitchen... 1 two times a day    Metronidazole 250 Mg Tabs (Metronidazole) .Marland Kitchen... 1 three times a day Her updated medication list for this problem includes:    Zithromax Z-pak 250 Mg Tabs (Azithromycin) .Marland Kitchen... 2 tabs by mouth today, then one tablet by mouth daily x 4 more days    Proair Hfa 108 (90 Base) Mcg/act Aers (Albuterol sulfate) .Marland Kitchen... 2 puffs every 6 hours as needed for wheezing  Orders: CXR- 2view (CXR)  Complete Medication List: 1)  Folic Acid 800 Mcg Tabs (Folic acid) .Marland Kitchen.. 1 by mouth once daily 2)  Multi-vi-min  .... 2 by mouth once daily 3)  Esterol  .Marland Kitchen.. 1 by mouth once daily 4)  Cal-6-mg  .... 2 by mouth once daily (contains 400iu of vit d) 5)  Fish Oil  .Marland Kitchen.. 1 by mouth once daily 6)  Tamiflu 75 Mg Caps (Oseltamivir phosphate) .... Take one tablet by mouth twice a day for five  days 7)  Zithromax Z-pak 250 Mg Tabs (Azithromycin) .... 2 tabs by mouth today, then one tablet by mouth daily x 4 more days 8)  Proair Hfa 108 (90 Base) Mcg/act Aers (Albuterol sulfate) .... 2 puffs every 6 hours as needed for wheezing  Other Orders: Flu A+B (04540)  Patient Instructions: 1)  Please follow up in 1 week, sooner if fever over 101, worsening nausea/vomitting, or if you develop shortness of breath 2)  Take 400-600mg  of Ibuprofen (Advil, Motrin) with food every 4-6 hours as needed for relief of pain or comfort of fever. 3)  Drink plenty of fluids and get plenty of rest. Prescriptions: PROAIR HFA 108 (90 BASE) MCG/ACT AERS (ALBUTEROL SULFATE) 2 puffs every 6 hours as needed for wheezing  #1 x 0   Entered and Authorized by:   Lemont Fillers FNP   Signed by:   Lemont Fillers FNP on 12/27/2009   Method used:   Reprint   RxID:   [id number] PROAIR HFA 108 (90 BASE) MCG/ACT AERS (ALBUTEROL SULFATE) 2 puffs every 6 hours as needed for wheezing  #1 x 0   Entered and Authorized by:   Lemont Fillers FNP   Signed by:   Lemont Fillers FNP on 12/27/2009   Method used:   Print then Give to Patient   RxID:   [id number] ZITHROMAX Z-PAK 250 MG TABS (AZITHROMYCIN) 2 tabs by mouth today, then one tablet by mouth daily x 4 more days  #1 pack x 0   Entered and Authorized by:   Lemont Fillers FNP   Signed by:   Lemont Fillers FNP on 12/27/2009   Method used:   Print then Give to Patient   RxID:   [id number] TAMIFLU 75 MG CAPS (OSELTAMIVIR PHOSPHATE) take one tablet by mouth twice a day for five days  #10 x 0   Entered and Authorized by:   Lemont Fillers FNP   Signed by:   Lemont Fillers FNP on 12/27/2009   Method used:   Print then Give to Patient   RxID:   [id number]   Current Allergies (reviewed today): SULFA  Laboratory Results    Other Tests  Influenza A: positive Influenza B: negative

## 2010-12-04 NOTE — Assessment & Plan Note (Signed)
Summary: acute - congested,cbs   Vital Signs:  Patient profile:   75 year old female Weight:      175.6 pounds BMI:     28.45 Temp:     97.2 degrees F oral Pulse rate:   76 / minute Resp:     14 per minute BP sitting:   124 / 80  (left arm) Cuff size:   large  Vitals Entered By: Shonna Chock (February 04, 2009 2:57 PM)  History of Present Illness: Onset 5 days ago as ST then clear drainage. Green sputum & purulent nasal discharge as of 02/03/2009. Rx: OTC meds ; Tylenol Sinus, Claritin. NMR Lipoprofile reviewed ; risks ( 15% ) & goals (LDL < 80). F had MI @ 47. On heart healthy diet; CVE 3X/week w/o symptoms.  Lipid Management History:      Positive NCEP/ATP III risk factors include female age 60 years old or older and family history for ischemic heart disease (females less than 75 years old).  Negative NCEP/ATP III risk factors include non-diabetic, HDL cholesterol greater than 60, non-tobacco-user status, non-hypertensive, no ASHD (atherosclerotic heart disease), no prior stroke/TIA, no peripheral vascular disease, and no history of aortic aneurysm.     Allergies: 1)  Sulfa  Review of Systems General:  See HPI; Denies chills, fever, and sweats. Eyes:  Denies discharge, eye pain, and red eye. ENT:  Complains of nasal congestion and sinus pressure; denies earache; Facial pain, frontal headache. Resp:  Denies chest pain with inspiration, coughing up blood, pleuritic, shortness of breath, and wheezing. Allergy:  Denies itching eyes and sneezing.  Physical Exam  General:  in no acute distress; alert,appropriate and cooperative throughout examination Ears:  External ear exam shows no significant lesions or deformities.  Otoscopic examination reveals clear canals, tympanic membranes are intact bilaterally without bulging, retraction, inflammation or discharge. Hearing is grossly normal bilaterally. Nose:  External nasal examination shows no deformity or inflammation. Nasal mucosa are  pink and moist without lesions or exudates. Mouth:  Oral mucosa and oropharynx without lesions or exudates.  Teeth in good repair.Laryngitis Lungs:  Normal respiratory effort, chest expands symmetrically. Lungs are clear to auscultation, no crackles or wheezes. Heart:  Normal rate and regular rhythm. S1 and S2 normal without gallop, murmur, click, rub or other extra sounds. Pulses:  R and L carotid,radial,dorsalis pedis and posterior tibial pulses are full and equal bilaterally Cervical Nodes:  No lymphadenopathy noted Axillary Nodes:  No palpable lymphadenopathy   Impression & Recommendations:  Problem # 1:  BRONCHITIS-ACUTE (ICD-466.0)  Her updated medication list for this problem includes:    Amoxicillin-pot Clavulanate 875-125 Mg Tabs (Amoxicillin-pot clavulanate) .Marland Kitchen... 1 two times a day with a meal  Problem # 2:  SINUSITIS- ACUTE-NOS (ICD-461.9)  Her updated medication list for this problem includes:    Amoxicillin-pot Clavulanate 875-125 Mg Tabs (Amoxicillin-pot clavulanate) .Marland Kitchen... 1 two times a day with a meal  Problem # 3:  HYPERLIPIDEMIA (ICD-272.4)  Her updated medication list for this problem includes:    Pravastatin Sodium 40 Mg Tabs (Pravastatin sodium) .Marland Kitchen... 1 at bedtime  Complete Medication List: 1)  Vitamin E 400 Unit Caps (Vitamin e) .Marland Kitchen.. 1 by mouth once daily 2)  Folic Acid 800 Mcg Tabs (Folic acid) .Marland Kitchen.. 1 by mouth once daily 3)  Multi-vi-min  .... 2 by mouth once daily 4)  Esterol  .Marland Kitchen.. 1 by mouth once daily 5)  Cal-6-mg  .... 2 by mouth once daily (contains 400iu of vit d) 6)  Vitamin D3 1000 Unit Caps (Cholecalciferol) .Marland Kitchen.. 1 by mouth once daily 7)  Fish Oil  .Marland Kitchen.. 1 by mouth once daily 8)  Black Elderberry  .Marland KitchenMarland Kitchen. 1 by mouth once daily as needed (immune booster) 9)  Echinacea Supreme  .Marland KitchenMarland Kitchen. 1 by mouth two times a day as needed 10)  Amoxicillin-pot Clavulanate 875-125 Mg Tabs (Amoxicillin-pot clavulanate) .Marland Kitchen.. 1 two times a day with a meal 11)  Pravastatin Sodium  40 Mg Tabs (Pravastatin sodium) .Marland Kitchen.. 1 at bedtime  Lipid Assessment/Plan:      Based on NCEP/ATP III, the patient's risk factor category is "0-1 risk factors".  The patient's lipid goals have been set as follows: Total cholesterol goal is 200; LDL cholesterol goal is 80; HDL cholesterol goal is 50; Triglyceride goal is 150.  Her LDL cholesterol goal has not been met.  Secondary causes for hyperlipidemia have been ruled out.  She has been counseled on adjunctive measures for lowering her cholesterol and has been provided with dietary instructions.    Patient Instructions: 1)  Neti pot once daily two times a day until sinuses clear. 2)  Please schedule a follow-up appointment in 3 months for fasting labs : lipids, hepatic panel; 272.4,995.20 after 10 weeks of red yeast rice or Pravastatin 40 mg.Your LDL goal = < 80. 3)  Drink as much fluid as you can tolerate for the next few days. Prescriptions: PRAVASTATIN SODIUM 40 MG TABS (PRAVASTATIN SODIUM) 1 at bedtime  #90 x 0   Entered and Authorized by:   Marga Melnick MD   Signed by:   Marga Melnick MD on 02/04/2009   Method used:   Print then Give to Patient   RxID:   [id number] AMOXICILLIN-POT CLAVULANATE 875-125 MG TABS (AMOXICILLIN-POT CLAVULANATE) 1 two times a day with a meal  #20 x 0   Entered and Authorized by:   Marga Melnick MD   Signed by:   Marga Melnick MD on 02/04/2009   Method used:   Print then Give to Patient   RxID:   [id number]

## 2010-12-04 NOTE — Letter (Signed)
Summary: [facility]   Imported By: Freddy Jaksch 01/09/2008 15:27:56  _____________________________________________________________________  External Attachment:    Type:   Image     Comment:   External Document

## 2010-12-04 NOTE — Assessment & Plan Note (Signed)
Summary: STOMACHE PAIN/BUMPS ON TORSO/CBS   Vital Signs:  Patient profile:   75 year old female Weight:      172.8 pounds Temp:     98.2 degrees F oral Pulse rate:   72 / minute Resp:     16 per minute BP sitting:   124 / 86  (left arm) Cuff size:   large  Vitals Entered By: Shonna Chock (April 10, 2009 1:23 PM) CC: 1.) RASH ON STOMACH-PAINFUL , ONSET:YESTERDAY  2.) NO ENERGY, NO APPETITE Comments REVIEWED MED LIST, PATIENT AGREED DOSE AND INSTRUCTION CORRECT    CC:  1.) RASH ON STOMACH-PAINFUL , ONSET:YESTERDAY  2.) NO ENERGY, and NO APPETITE.  History of Present Illness: Onset approx 12:30 am 04/09/09 as constant  excruciating epigastric pain X 3 hrs over 3 hrs. Onset on rash each thorax from armpits to waist as welts 6/8 approx 8 pm. Benadryl helped itching  & rash. No known vector or exposure but ? bitten 04/05/09 R forearm. No PMH of GI disease, but with larg meals she has "GERD " feeling. Her father had colon CA.  Allergies: 1)  Sulfa  Review of Systems General:  Denies chills, fever, and sweats. ENT:  Denies difficulty swallowing and hoarseness. CV:  Denies chest pain or discomfort. Resp:  Denies chest pain with inspiration, cough, pleuritic, shortness of breath, sputum productive, and wheezing. GI:  See HPI; Complains of indigestion and nausea; denies bloody stools, change in bowel habits, constipation, dark tarry stools, diarrhea, vomiting, and yellowish skin color; Black stool 6/8 upon arising ; Pepto Bismol taken @ 1 am 6/8. GU:  Denies discharge, dysuria, and hematuria; No coke colored urine. [place] of ovaries for tenderness 6/15. MS:  Denies low back pain, mid back pain, and thoracic pain. Derm:  See HPI. Allergy:  Complains of hives or rash; No angioedema.  Physical Exam  General:  well-nourished,in no acute distress; alert,appropriate and cooperative throughout examination Eyes:  No corneal or conjunctival inflammation noted. Perrla.No icterus Mouth:  Oral mucosa and  oropharynx without lesions or exudates.  Teeth in good repair. Lungs:  Normal respiratory effort, chest expands symmetrically. Lungs are clear to auscultation, no crackles or wheezes. Heart:  Normal rate and regular rhythm. S1 and S2 normal without gallop, murmur, click, rub. S4 Abdomen:  Bowel sounds positive,abdomen soft but diffusely minimally tender without masses, organomegaly or hernias noted. Rectal:  Stool cards given Skin:  Diffuse blanching eryhthematous plagues esp under bra line. Dermatographia present. 13X11 mm non blanching rash R forearm with punctate entry site. No palmar rash Cervical Nodes:  No lymphadenopathy noted Axillary Nodes:  No palpable lymphadenopathy Psych:  memory intact for recent and remote, normally interactive, and good eye contact.     Impression & Recommendations:  Problem # 1:  ABDOMINAL PAIN, EPIGASTRIC (ICD-789.06)  Orders: Venipuncture (16109) TLB-CBC Platelet - w/Differential (85025-CBCD) TLB-Amylase (82150-AMYL) TLB-Hepatic/Liver Function Pnl (80076-HEPATIC) TLB-Lipase (83690-LIPASE)  Problem # 2:  URTICARIA (ICD-708.9)  Orders: Venipuncture (60454)  Problem # 3:  INSECT BITE, UPPER ARM (ICD-912.4)  Orders: T- * Misc. Laboratory test [phone])  Complete Medication List: 1)  Vitamin E 400 Unit Caps (Vitamin e) .Marland Kitchen.. 1 by mouth once daily 2)  Folic Acid 800 Mcg Tabs (Folic acid) .Marland Kitchen.. 1 by mouth once daily 3)  Multi-vi-min  .... 2 by mouth once daily 4)  Esterol  .Marland Kitchen.. 1 by mouth once daily 5)  Cal-6-mg  .... 2 by mouth once daily (contains 400iu of vit d) 6)  Vitamin  D3 1000 Unit Caps (Cholecalciferol) .Marland Kitchen.. 1 by mouth once daily 7)  Fish Oil  .Marland Kitchen.. 1 by mouth once daily 8)  Red Yeast Rice 600 Mg Tabs (Red yeast rice extract) .Marland Kitchen.. 1 by mouth once daily 9)  Coq10 100 Mg Caps (Coenzyme q10) .Marland Kitchen.. 1 by mouth once daily 10)  Hydroxyzine Pamoate 25 Mg Caps (Hydroxyzine pamoate) .Marland Kitchen.. 1-2 q 6 hrs as needed itching 11)  Ranitidine Hcl 150 Mg Caps  (Ranitidine hcl) .Marland Kitchen.. 1 two times a day pre meals  Patient Instructions: 1)  Complete stool cards. [place] of abd if pain recurs. Prescriptions: RANITIDINE HCL 150 MG CAPS (RANITIDINE HCL) 1 two times a day pre meals  #60 x 0   Entered and Authorized by:   Marga Melnick MD   Signed by:   Marga Melnick MD on 04/10/2009   Method used:   Print then Give to Patient   RxID:   [id number] HYDROXYZINE PAMOATE 25 MG CAPS (HYDROXYZINE PAMOATE) 1-2 q 6 hrs as needed itching  #30 x 1   Entered and Authorized by:   Marga Melnick MD   Signed by:   Marga Melnick MD on 04/10/2009   Method used:   Print then Give to Patient   RxID:   [id number]

## 2011-02-08 LAB — BASIC METABOLIC PANEL
BUN: 12 mg/dL (ref 6–23)
CO2: 30 mEq/L (ref 19–32)
Calcium: 9.6 mg/dL (ref 8.4–10.5)
Chloride: 103 mEq/L (ref 96–112)
Creatinine, Ser: 0.66 mg/dL (ref 0.4–1.2)
GFR calc Af Amer: >60 mL/min (ref >60)
GFR calc non Af Amer: >60 mL/min (ref >60)
Glucose, Bld: 94 mg/dL (ref 70–99)
Potassium: 3.8 mEq/L (ref 3.5–5.1)
Sodium: 140 mEq/L (ref 135–145)

## 2011-02-08 LAB — CBC
HCT: 36.7 % (ref 36.0–46.0)
HCT: 45.6 % (ref 36.0–46.0)
Hemoglobin: 12.9 g/dL (ref 12.0–15.0)
Hemoglobin: 16 g/dL — ABNORMAL HIGH (ref 12.0–15.0)
MCHC: 35 g/dL (ref 30.0–36.0)
MCHC: 35.1 g/dL (ref 30.0–36.0)
MCV: 92.6 fL (ref 78.0–100.0)
MCV: 93.6 fL (ref 78.0–100.0)
Platelets: 200 10*3/uL (ref 150–400)
Platelets: 240 10*3/uL (ref 150–400)
RBC: 3.92 MIL/uL (ref 3.87–5.11)
RBC: 4.93 MIL/uL (ref 3.87–5.11)
RDW: 12.8 % (ref 11.5–15.5)
RDW: 12.9 % (ref 11.5–15.5)
WBC: 11.1 10*3/uL — ABNORMAL HIGH (ref 4.0–10.5)
WBC: 7.3 10*3/uL (ref 4.0–10.5)

## 2011-02-08 LAB — URINALYSIS, ROUTINE W REFLEX MICROSCOPIC
Bilirubin Urine: NEGATIVE
Glucose, UA: NEGATIVE mg/dL
Hgb urine dipstick: NEGATIVE
Ketones, ur: NEGATIVE mg/dL
Nitrite: NEGATIVE
Protein, ur: NEGATIVE mg/dL
Specific Gravity, Urine: <1.005 — ABNORMAL LOW (ref 1.005–1.030)
Urobilinogen, UA: 0.2 mg/dL (ref 0.0–1.0)
pH: 7 (ref 5.0–8.0)

## 2011-02-08 LAB — TYPE AND SCREEN
ABO/RH(D): O POS
Antibody Screen: NEGATIVE

## 2011-02-08 LAB — URINE MICROSCOPIC-ADD ON

## 2011-02-08 LAB — ABO/RH: ABO/RH(D): O POS

## 2011-03-17 NOTE — Assessment & Plan Note (Signed)
[facility]                         GASTROENTEROLOGY OFFICE NOTE   Maria [name], Maria [name]                   MRN:          [id number]  DATE:10/03/2007                            DOB:          May 20, 1936    NEW PATIENT OFFICE VISIT:   PRIMARY CARE PHYSICIAN:  Dr. Marga Melnick   PROBLEM:  Rectal bleeding and pain.   HISTORY:  Maria [name] is a very nice 75 year old white female known remotely  to Dr. Lina Sar, who had undergone colonoscopy last around 1989. I do  not have a copy of that report available at this time.  She also has had  followup colonoscopy since that time because of family history of colon  cancer in her father.  She had been living in [place], [place] and  says that she has had several colonoscopies, had not been found to have  any polyps but does have diverticular disease.  She believes her last  colonoscopy was around 5 years ago and that she is due again for  followup.  She has been in the [place] area again for the past couple  of years, was initially diagnosed with non small cell lung cancer, stage  1A, in December of 2002.  She underwent right upper lobe lobectomy with  Dr. Edwyna Shell and has been well since.  She at this time says that she had  onset of her current symptoms mildly about 1 month ago with some mild  rectal discomfort which resolved with preparation H.  She had traveled  to [facility] last week over the holidays. Said she did have some mild  diarrhea and then started having some rectal pain which has been  persistent over the past several days.  She says the pain feels like an  external rectal pain with burning and itching that seems to be worse  with standing, better with sitting.  She has noticed just on one  occasion a very small amount of blood on the tissue.  She has been  having normal bowel movements, denies any constipation, straining, etc.  Takes Metamucil on a daily basis.   CURRENT MEDICATIONS:   Multivitamin daily, vitamin C, E, fish oil, Co-Q-  10 supplements and Cymbalta 30 daily, Metamucil daily.   ALLERGIES:  No known drug allergies.   PAST HISTORY:  1. Pertinent for stage 1A non small cell lung cancer diagnosed in 2002      as above, status post right upper lobectomy.  2. Status post hysterectomy in 1980.  3. Otherwise healthy.   FAMILY HISTORY:  Pertinent for colon cancer in her father, also heart  disease in her father.   SOCIAL HISTORY:  The patient is divorced, lives alone. Has two grown  children. She is retired from [employer]. She is a nonsmoker, she drinks  alcohol socially.   REVIEW OF SYSTEMS:  Pertinent only for arthritic symptoms and a heart  murmur which is secondary to her mitral valve. Review of systems  otherwise completely negative except in GI as outlined above.   PHYSICAL EXAMINATION:  GENERAL:  A well-developed white female in no  acute distress.  VITAL  SIGNS:  Height is 5 feet, 6-1/2, weight is 179.  Blood pressure  134/72, pulse is 72.  CARDIOVASCULAR:  Regular rate and rhythm with S1 and S2.  PULMONARY:  Clear to auscultation and percussion.  ABDOMEN:  Soft and nontender, there is no palpable mass or  hepatosplenomegaly.  RECTAL EXAM:  No external lesion noted. She does have internal  hemorrhoids palpable and tender, non thrombosed.  Stool is brown and  Hemoccult positive.   IMPRESSION:  38. 75 year old white female with a 1-week history of rectal pain and      small volume hematochezia, suspect this is secondary to internal      hemorrhoids.  2. Positive family history of colon cancer in the patient's father.      Patient due for a followup colonoscopy.  3. Personal history of non small cell lung cancer status post right      upper lobe resection in 2002.   PLAN:  1. Start Analpram cream 2.5% t.i.d. to x4 daily x10 days and then      p.r.n. thereafter.  2. Schedule followup colonoscopy at her convenience with Dr. Lina Sar.  3.  Obtain previous colonoscopy records from [place].      Amy Rampart, [place]  Electronically Signed      Hedwig Morton. Juanda Chance, MD  Electronically Signed   AE/MedQ  DD: 10/03/2007  DT: 10/03/2007  Job #: [id number]   cc:   Titus Dubin. Alwyn Ren, MD,FACP,FCCP

## 2011-03-17 NOTE — Op Note (Signed)
Maria [name], Maria [name]            ACCOUNT NO.:  [id number]   MEDICAL RECORD NO.:  [id number]          PATIENT TYPE:  OIB   LOCATION:  9302                          FACILITY:  [facility]   PHYSICIAN:  Lendon Colonel, MD   DATE OF BIRTH:  12/29/1935   DATE OF PROCEDURE:  05/07/2009  DATE OF DISCHARGE:                               OPERATIVE REPORT   PREOPERATIVE DIAGNOSES:  Cystocele, stress urinary incontinence.   POSTOPERATIVE DIAGNOSES:  Cystocele, stress urinary incontinence.   PROCEDURE:  Tension-free vaginal tape sling, cystocele repair.   SURGEON:  Lendon Colonel, MD   ASSISTANT:  Genia Del, MD   ANESTHESIA:  Epidural.   FINDINGS:  Stage II cystocele, friable vaginal mucosa, bladder adhesed  to vaginal epithelium superiorly, normal bladder, no foreign body, no  suture on cystoscopy.  There were no  masses and bilateral patent  ureters at the end of the case.   SPECIMENS:  None.   ANTIBIOTICS:  Gentamicin and clindamycin.   ESTIMATED BLOOD LOSS:  Less than 100 mL.   COMPLICATIONS:  None.   INDICATIONS:  This is a 75 year old active white female with complaints  of cystocele and stress incontinence, desiring surgical correction.   PROCEDURE:  After informed consent was obtained, the patient was taken  to the operating room where epidural anesthesia was initiated without  difficulty.  She was prepped and draped in normal sterile fashion in the  high-lithotomy position.  A Foley catheter was inserted into the  bladder.  Several minutes were spent in evaluating the anterior wall  defect and the bladder neck surgical mapping was carried out. A weighted  speculum was placed in the vagina.  Allis clamps were placed  approximately 4 cm below the urethra and a second set of Allis clamps  were placed at the apex of the cystocele high in the vagina.  A solution  of dilute Marcaine with 1:2000 units of epinephrine was used about 10 mL  in the vaginal epithelium.  A  transverse incision was made across the 2  Allis clamps at the apex of the cystocele, and the Metzenbaum scissors  was used to create a midline incision extending from the apex of the  cystocele to the Allis clamp 4 cm below the urethra.  The vaginal mucosa  was undermined with the Metzenbaum and then divided until level of the  Allis.  Allis clamps were placed on the lateral edges of the vaginal  mucosa and using a counter traction with the Russians, the underlying  fascia was dissected off from the vaginal mucosa.  Of note, the vaginal  mucosa was quite friable and several vaginal mucosal tears were  encountered.  The friability of the vaginal mucosa made it difficult to  dissect all the way to the apex of the cystocele.  The bladder was also  adhered tightly to the underlying fascia at the apex of the cystocele.  At this point, the decision was made to limit the edges of the  dissection of the cystocele due to continued vaginal tearing, vaginal  bleeding, and concern for inadvertent entry into the bladder.  Once the  underlying connective tissue was dissected off the vaginal mucosa, the  connective tissue was plicated with 0 Vicryl in a series of interrupted  figure-of-eight sutures.  The vaginal mucosa was then trimmed and the  vaginal mucosa was closed with 2-0 Vicryl interrupted figure-of-eight  sutures.  Of note, a small mucosal tear on the right side of vagina was  repaired with figure-of-eight suture.  The cystocele repair was  complete, was hemostatic, and the decision was made to proceed with the  sling.  A Allis clamp was placed 2 cm below the urethra.  Another Allis  clamp was placed 2 cm below that in the mid urethral position.  This was  assessed by tugging on the Foley catheter.  A solution of 30 mL of 0.5%  Marcaine with 1:2000 units of epinephrine with 60 mL of normal saline  was used.  Abdominal exit points were marked just above the pubic  symphysis, 2 marks were made 4  cm apart.  At the exit points, a 20-gauge  spinal needle was inserted behind the pubic bone into the retropubic  space, this was guided by a vaginal finger.  A 20 mL of injection was  placed in the retropubic space bilaterally.  An additional 10 mL placed  along the tracts coming out to the abdominal wall.  Additional 10 mL was  placed in the vaginal epithelium.  The vertical incision was made  between the 2 Allis clamps with the knife.  The underlying connective  tissue was dissected off with the Metzenbaum scissors.  Allis clamps  were placed on the side edges of the vaginal mucosa and dissection was  done to the vaginal edges to the retropubic space.  This created a  tunnel for the TVT sling.  The TVT sling was prepared.  A rigid Foley  catheter was placed deviated towards the patient's right knee and the  rigid and the TVT trocar was dissected just under the pubic bone was  sharp anteversion towards the anterior abdominal wall after passing  under the pubic bone.  This was guided by visualization of the patient's  ipsilateral shoulder.  Once the trocar was through, cystoscopy was  carried out to ensure no trocar was in the bladder.  The trocar was then  pulled through.  The rigid Foley catheter was replaced this time and  diverted to the patient's left knee in the pre made track.  The TVT  trocar was placed.  A finger was placed in the vagina to ensure this did  not perforate the vaginal mucosa using gentle pressure just to superior  through the retropubic space and then sharp anteversion to the anterior  abdominal wall.  The trocar was brought back out to the abdominal wall.  Repeat cystoscopy was carried out.  Again, no trocar was done in the  bladder.  No suture was noted in the bladder from the cystocele repair.  Bilateral peristalsing ureters jetting indigo carmine that had been  placed at that time was noted.  The trocars were then pulled through  bilaterally tension free  placement was placed along the vaginal aspect  of the TVT sling.  This was done with a Babcock clamp.  The TVT sling  tape was pulled out through the anterior abdominal wall.  Tension free  placement was noted and the plastic sheaths were removed.  The vaginal  epithelium was closed with 2-0 Vicryl in single interrupted sutures.  Good hemostasis was noted.  The vagina  was packed with warm plain gauze  coated in a vaginal estrogen cream.  The TVT sling was cut at the  anterior abdominal wall and Dermabond was placed over the incisions.  The patient tolerated the procedure well.  Sponge, lap, and needle  counts were correct x3.  The patient was taken to the recovery room in  stable condition.      Lendon Colonel, MD  Electronically Signed     KAF/MEDQ  D:  05/07/2009  T:  05/07/2009  Job:  [id number]

## 2011-03-20 NOTE — H&P (Signed)
. [facility]  Patient:    Maria [name], Maria [name] Visit Number: [id number] MRN: [id number]          Service Type: Attending:  D. Karle Plumber, M.D. Dictated by:   Lissa Merlin, P.A. Adm. Date:  10/04/01   CC:         Titus Dubin. Alwyn Ren, M.D. [facility]   History and Physical  DATE OF BIRTH:  12-Aug-1936  PRIMARY CARE:  Titus Dubin. Alwyn Ren, M.D.  CHIEF COMPLAINT:  Asymptomatic right upper lobe lung lesion.  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 Her problems began around five weeks ago when she tripped walking her dog landing on her ribs. After having rib pain for some time, she went to the emergency room for an x-ray. Her ribs were okay, but there was an incidental right upper lobe lung lesion seen on the x-ray. She was then sent for CT scan and PET scans in [facility] which confirmed the lesion. At that time, [name] made arrangements to return to [facility] for further evaluation. After talking with Titus Dubin. Alwyn Ren, M.D., she was referred to D. Karle Plumber, M.D. for further management. [name] has been completely asymptomatic with regard to the lesion. She denies any cough, fever, chills, weight loss. Her only complaint has been some mild dyspnea on exertion, but she is not sure whether this is something new or not.  She saw D. Karle Plumber, M.D. on September 20, 2001. Norton Blizzard, M.D. examined [name] and reviewed the scans. He thought that the PET scan was indicative of either an inflammatory process or cancer. He discussed the possibilities with her. He entertained the idea that since she is out in the [facility] a fungal pathology may be a possibility. He recommended a VATS wedge resection of the lesion; and if the pathology showed small cell lung  cancer, then he recommended proceeding with VATS lobectomy and node dissection. This was agreed upon and scheduled for October 04, 2001.  PAST MEDICAL HISTORY:  She has been very healthy with few medical problems. 1. She has had mitral valve prolapse which has not been an issue. 2. She had rheumatic fever as a child. 3. History of hypertension.  PAST SURGICAL HISTORY: 1. Repair of torn retina, right eye, 1995. 2. Hysterectomy.  SOCIAL HISTORY:  She is divorced with two adult children who live here in [place]. She has a retired a Nutritional therapist from [employer] and has moved to [place] since 1998 where she has no family but some close friends. She smoked one pack per day for 20 years, quitting in 1985. She has occasional social alcohol use. She is very active and enjoys hiking and outdoor activities.  FAMILY HISTORY:  Mother is 32 years old, living with COPD and home O2. Father is deceased, age 66 secondary to heart condition. He also had colon cancer. Of note, she had a brother who was deceased at age 64 from mycosis fungoides.  MEDICATIONS:  She takes no prescription medications. She takes numerous over-the-counter vitamins and supplements. She has been taking a daily aspirin lately.  ALLERGIES:  No known drug allergies.  REVIEW OF SYSTEMS:  Positive for mild dyspnea on exertion. Otherwise negative. She specifically denies any problems with urination or her bowel movements. She denies chills, sweats, fevers, cough. She denies thyroid or  liver problems. She denies history of stroke or TIA. She denies history of claudication or vascular problems. She denies history of pneumonia. She denies irregular heart rate and chest pain. She denies hemoptysis. She has had no unexplained weight loss. She does report occasional GERD symptoms and lower extremity peripheral edema.  PHYSICAL EXAMINATION:  VITAL SIGNS:  Blood pressure 138/78, pulse 80, respirations 16, O2  saturation 97% on room air, temperature 98.6.  GENERAL:  Well-nourished, well-developed, mildly obese, 75 year old white female in no acute distress. Alert and oriented x 3 who appears stated age.  HEENT:  Pupils are equal, round, and reactive to light. Extraocular movements intact. Visual acuity intact o.u. Oropharynx is grossly normal.  NECK:  Supple with no JVD. No bruits. No lymphadenopathy. There are 2+ carotid pulses.  CHEST:  Symmetrical.  LUNGS:  Clear and equal anteriorly and posteriorly.  CARDIAC:  Regular rate and rhythm, S1, S2 with a very soft click.  ABDOMEN:  Soft, nontender, with positive bowel sounds. No masses or bruits.  EXTREMITIES:  Trace lower extremity edema. Extremities are warm bilaterally and well perfused.  NEUROLOGICAL:  Nonfocal. Gait is steady. She has good muscular strength throughout.  ASSESSMENT AND PLAN:  This is a 75 year old white female who enjoys good overall health with a recent incidental right upper lobe lesion discovered on chest x-ray confirmed with PET. She has been referred to D. Karle Plumber, M.D. for further evaluation. He has met with her and discussed the findings and his plan of care. It has been agreed upon to proceed with right VATS wedge resection and possible lobectomy depending on pathology at [facility] on October 04, 2001. Risks, benefits, details, and alternatives were discussed, and it was agreed to proceed. [name] is stable for surgery tomorrow. Dictated by:   Lissa Merlin, P.A. Attending:  D. Karle Plumber, M.D. DD:  10/03/01 TD:  10/03/01 Job: [id number]

## 2011-03-20 NOTE — Op Note (Signed)
[place]. [facility]  Patient:    DELANO, SCARDINO Visit Number: [id number] MRN: [id number]          Service Type: SUR Location: 3300 3313 01 Attending Physician:  Cameron Proud Dictated by:   D. Karle Plumber, M.D. Proc. Date: 10/04/01 Admit Date:  10/04/2001   CC:         Titus Dubin. Alwyn Ren, M.D. [facility]   Operative Report  PREOPERATIVE DIAGNOSIS:  Right upper lobe lesion.  POSTOPERATIVE DIAGNOSIS:  Right upper lobe lesion; adenocarcinoma of the right upper lobe.  OPERATION PERFORMED:  Right VATS, right VATS lobectomy.  SURGEON:  D. Karle Plumber, M.D.  FIRST ASSISTANT:  Adair Patter, P.A.  ANESTHESIA:  General.  INDICATION FOR PROCEDURE:  This patient was found to have a right upper lobe lesion and a PET scan was positive and it was suspected to be cancer.  DESCRIPTION OF PROCEDURE:  She was brought to the operating room, underwent general anesthesia, was turned to the right lateral thoracotomy position, was prepped and draped in the usual sterile manner.  Two trocar sites were made in the anterior and posterior axillary line at the seventh intercostal space. Attempt was made to use the scope but the scope could not be used because of technical difficulties, so an incision was made along the sternocleidomastoid over the fifth intercostal space.  Dissection was carried down, reflecting the latissimus dorsi laterally and splitting the serratus anterior.  Two Tuffiers were placed at right angles.  The right upper lobe was identified and the lesion was seen in the apical segment of the right upper lobe.  It was resected with two applications of the TLC 75 stapler.  Frozen section revealed adenocarcinoma so it was decided to do a completion lobectomy.  The hilum was dissected out and the apical posterior branch to the right upper lobe was dissected out, and stapled and divided with the Auto Suture stapler.  Then, the superior pulmonary  vein branches to the right upper lobe were dissected out, looped with the vascular tape, and divided with an Auto Suture stapler, preserving the branch to the middle lobe.  The hilum was dissected posteriorly, dissecting out several 10R and 11R nodes around the bronchus. Than attention was turned to the fissure and the fissure was dissected down, and the superior portion of the fissure was divided with an Auto Suture stapler.  This exposed the posterior branch to the right upper lobe and this was stapled with divided with an Auto Suture stapler.  Finally, the minor fissure was divided with an application of the Auto Suture stapler.  Several more nodes were dissected from around the bronchus and the bronchus was stapled with a TL-30 stapler and divided distally.  Bronchial margins were negative.  The inferior pulmonary ligament was taken down with electrocautery. Two chest tubes were brought through the trocar sites and tied in place with 0 silk.  The chest was closed with 2 pericostals, #1 Vicryl in the muscle layer, 2-0 Vicryl in the subcutaneous tissue, and Ethicon skin clips.  The patient was returned to the recovery room in stable condition. Dictated by:   D. Karle Plumber, M.D. Attending Physician:  Cameron Proud DD:  10/05/01 TD:  10/05/01 Job: [id number]

## 2011-03-20 NOTE — Procedures (Signed)
[place]. [facility]  Patient:    Maria [name], Maria [name] Visit Number: [id number] MRN: [id number]          Service Type: SUR Location: [facility] 2899 [id number] Attending Physician:  Cameron Proud Dictated by:   Bedelia Person, M.D. Proc. Date: 10/04/01 Admit Date:  10/04/2001                             Procedure Report  PROCEDURE:  Epidural catheter placement for postoperative epidural fentanyl pain control.  DESCRIPTION OF PROCEDURE:  Prior to the surgical procedure the risks and benefits of postoperative epidural fentanyl pain control were discussed with the patient.  These included but were not limited to the risk of nerve damage, paralysis, backache, headache, nausea, infection, allergic reaction to the anesthesia, and, more commonly, itching.  The patient had asked for the epidural and in consultation with his primary surgeon had requested this be done for postop pain control relief.  After the surgical dressing was applied, the patient was turned to the full left lateral decubitus position.  Betadine prep x 3 was performed on the lumbar region, and sterile technique was used. A 17-gauge Tuohy needle was inserted in the L3 interspace.  Using air loss of resistance and a midline approach, the epidural space was easily located on the first pass.  There was no blood or CSF noted.  A nonstyletted catheter was then placed 6 cm into the epidural space and the needle was removed.  There was a negative aspiration of blood or CSF through the catheter.  Next preservative-free Xylocaine 1% 5 cc, 0.9 normal saline 10 cc, and 100 mcg of fentanyl was slowly injected into the epidural catheter with frequent negative aspiration for blood or CSF.  The catheter was securely adhesed to the patients back using four-inch Hypafix dressing over sterile 2 x 2 gauze at the insertion site.  The patient was then returned to the supine position, awoken, extubated, and taken to the  recovery room, where she was placed on continuous infusion of 5 mcg/cc epidural fentanyl without any local anesthetic in consultation with D. Karle Plumber, M.D.  The initial dose is 16 cc/hr. She will be evaluated for the next two days for adequacy of pain control. There were no complications during the procedure. Dictated by:   Bedelia Person, M.D. Attending Physician:  Cameron Proud DD:  10/04/01 TD:  10/04/01 Job: [id number]

## 2011-03-20 NOTE — Consult Note (Signed)
[place]  Patient:    JOEI, FRANGOS Visit Number: [id number] MRN: [id number]          Service Type: SUR Location: 3300 3313 01 Attending Physician:  Cameron Proud Dictated by:   Lorette Ang, N.P. Proc. Date: 10/07/01 Admit Date:  10/04/2001 Discharge Date: 10/10/2001   CC:         Titus Dubin. Alwyn Ren, M.D. [facility]  D. Karle Plumber, M.D.   Consultation Report  DATE OF BIRTH:  09-18-1936  REASON FOR CONSULTATION:  Adenocarcinoma of the right lung.  REFERRING:  D. Karle Plumber, M.D.  HISTORY OF PRESENT ILLNESS:  Ms. Lattner is a 75 year old woman with an incidental finding of a right upper lobe lung lesion recently.  A followup chest CT and PET scan confirmed the presence of the right upper lobe lesion. All workup was done in [place], [place], where the patient resides.  She elected to have further evaluation done in [place], as she has family living in this area.  The patient was referred to Dr. Karle Plumber and underwent right VATS/right upper lobectomy with lymph node dissection on October 04, 2001.  Pathology [phone]) showed an invasive, well-differentiated, 2.1 cm adenocarcinoma with negative margins and no positive lymph nodes.  A brain CT was negative for evidence of metastatic disease.  PAST MEDICAL HISTORY: 1. Questionable history of hypertension. 2. Mitral valve prolapse. 3. History of rheumatic fever. 4. History of partially detached retina in 1995, status post laser repair. 5. Status post hysterectomy at age 85.  MEDICATIONS: 1. Aspirin 325 mg daily. 2. Dulcolax 10 mg daily. 3. Vioxx 25 mg daily.  ALLERGIES:  No known drug allergies.  FAMILY HISTORY:  Mother is 80 years old and has COPD; father deceased at age 67 with CHF, MI, history of colon cancer; brother deceased at age 74 with lymphoma; one sister living and reported to be healthy.  SOCIAL HISTORY:  Ms. Kuba  lives in [place], [place].  She is originally from [place].  She is divorced.  She has two sons, who are both healthy.  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  She has a positive tobacco history, stating that she quit smoking in 1985 at one pack per day for more than 20 years.  She reports ETOH use as social.  REVIEW OF SYSTEMS:  The patient denies any weight loss or anorexia.  Her energy level has been slightly diminished.  She denies any pain.  She has had no fevers.  She denies any unusual headaches or vision changes.  She does report shortness of breath, which she attributes to living in a high altitude. She denies any cough.  She has had no hemoptysis.  She denies any chest pain. She has no peripheral edema.  She has had no recent change in her bowel habits and denies any rectal bleeding.  She has had hematuria or dysuria.  She denies any falls.  She has had no skin changes or rashes.  PHYSICAL EXAMINATION:  VITAL SIGNS:  Temperature 97.9, heart rate 88, respirations 16, blood pressure 106/56, oxygen saturation 96% on room air.  GENERAL:  Pleasant, well-nourished, Caucasian female in no acute distress.  HEENT:  Normocephalic, atraumatic.  Pupils are equal and reactive to light; extraocular movements are intact; sclerae are anicteric.  Oropharynx is clear.  LYMPH NODES:  No palpable cervical, supraclavicular, or left axillary lymph nodes.  Right axilla with some edema, ecchymoses secondary  to surgery.  CHEST:  Diminished breath sounds bilaterally.  Right chest dressing is intact.  ABDOMEN:  Soft and nontender.  EXTREMITIES:  No cyanosis, clubbing, or edema.  NEUROLOGIC:  Alert and oriented x 3.  Follows commands.  LABORATORY DATA:  Hemoglobin 12.1, white count 10, platelets 154,000, MCV 91.4.  Sodium 140, potassium 3.7, BUN 4, creatinine 0.5, glucose 135, total bilirubin 1.2, alkaline phosphatase 46, SGOT 28, SGPT 18, total protein  5.8, albumin 3.0.  Her calcium is 8.7.  PREOPERATIVE LABORATORY WORK:  Hemoglobin 14.4, white count 7.2, platelets 280,000.  Sodium 142, potassium 4.1, BUN 13, creatinine 0.5, glucose 103, total bilirubin 1.6, alkaline phosphatase 70, SGOT 30, SGPT 27, total protein 6.8, albumin 4.1, and calcium 9.7.  RADIOLOGY:  Brain CT October 08, 2001:  No evidence of metastasis.  IMPRESSION AND PLAN:  Ms. Rutan is a 75 year old woman with an incidental finding of a right upper lobe lung mass, now status post right video-assisted thoracic surgery/right upper lobectomy with pathology confirming a 2.1 cm, well-differentiated, node negative adenocarcinoma.  Brain scan was negative for evidence of metastatic disease.  There is no role for adjuvant chemotherapy in this setting.  She may benefit from enrollment in a research protocol if one is available.  She will require careful followup with scans every three months for the next two years.  The patient plans on returning to [place] after the holidays and will obtain followup there.  The patient seen and examined by Dr. Donnie Coffin; labs reviewed. Dictated by:   Lorette Ang, N.P. Attending Physician:  Cameron Proud DD:  10/17/01 TD:  10/17/01 Job: [id number]

## 2011-03-20 NOTE — Discharge Summary (Signed)
. [facility]  Patient:    Maria [name], Maria [name] Visit Number: [id number] MRN: [id number]          Service Type: SUR Location: 3300 3313 01 Attending Physician:  Cameron Proud Dictated by:   Dominica Severin, P.A. Admit Date:  10/04/2001 Discharge Date: 10/10/2001   CC:         Maria [name], M.D.  CVTS office  Pierce Crane, M.D.   Discharge Summary  DATE OF BIRTH:  04/27/2036  PRIMARY ADMISSION DIAGNOSIS:  Right upper lung lesion.  SECONDARY DIAGNOSES/PAST MEDICAL HISTORY: 1. Mitral valve prolapse. 2. Hypertension. 3. Rheumatic fever as a child.  PROCEDURES:  Right thoracotomy with right upper lobectomy and lymph node dissection done on October 04, 2001.  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  Her problem began around 5 weeks ago when she tripped walking her dog, landing on her ribs. After having rib pain for some time she went to the emergency room for an x-ray.  Her ribs were okay, but there was incidental right upper lobe lung lesion seen on x-ray.  She was then sent for a CT scan and PET scan in [facility] which confirmed the lesion.  At that time, Maria [name] made arrangements to return to [facility] for further evaluation.  After talking to Dr. Alwyn [name], she was referred to Dr. Edwyna Shell for further management.  Dr. Edwyna Shell had seen the patient and had agreed that she would benefit from undergoing a VATS wedge resection of lesion, if the pathology showed a small cell lung cancer, recommended proceeding with VATS lobectomy and node dissection.  She had agreed to undergo the surgery, and underwent the surgery as stated above on October 04, 2001.  She tolerated the procedure well.  Postoperatively, she remained stable clinically.  She did not have any cardiac or  respiratory complications.  She was tolerating her diet without difficulty.  Both her chest tubes were discontinued on postoperative day #3.  She was seen in consultation by oncology due to the pathology report which reported as adenocarcinoma stage T1, N0, M0.  Dr. Donnie Coffin had agreed that there is no routine use of adjacent chemotherapy, although he wanted to have casual followup and see every three months for the next 2 years.  She was ambulating without difficulty, her incisions remained clean and dry without any signs of infection or complications.  Her epidural was discontinued on October 07, 2001, on postoperative day #3, she tolerated that well, and had adequate pain control.  She remained afebrile.  Her chest x-ray was okay after chest tube removal, and she is anticipated for discharge the morning of October 10, 2001.  CONDITION ON DISCHARGE:  Stable.  DISCHARGE MEDICATIONS: 1. Tylox one or two tablets q.4-6h. p.r.n. pain. 2. Aspirin 325 mg q.d. 3. Ziac 25 mg tablet one daily.  ACTIVITY:  The patient is instructed not to do any driving, to avoid strenuous activity and lifting more than 10 pounds.  She is to walk daily and continue breathing exercises.  DIET:  She is to follow a low fat, low sodium diet.  WOUND CARE:  She is told she can shower and clean her incision with mild soap and water.  FOLLOWUP: 1. Dr. Edwyna Shell on October 14, 2001, at 3:30 p.m.  She is to go to the    [facility] for a chest x-ray one hour before that    appointment with Dr. Edwyna Shell. 2. She is also instructed to follow up with Dr. Donnie Coffin and Dr. Alwyn [name] as    directed. Dictated by:   Dominica Severin, P.A. Attending Physician:  Cameron Proud DD:  10/09/01 TD:  10/09/01 Job: [id number]

## 2011-04-30 ENCOUNTER — Encounter: Payer: Self-pay | Admitting: Family Medicine

## 2011-06-09 ENCOUNTER — Encounter: Payer: Self-pay | Admitting: Family Medicine

## 2011-09-12 ENCOUNTER — Encounter: Payer: Self-pay | Admitting: *Deleted

## 2011-09-15 ENCOUNTER — Ambulatory Visit (HOSPITAL_COMMUNITY)
Admission: RE | Admit: 2011-09-15 | Discharge: 2011-09-15 | Disposition: A | Discharge status: Home / Self Care | Payer: Medicare Other | Source: Ambulatory Visit | Attending: Internal Medicine | Admitting: Internal Medicine

## 2011-09-15 ENCOUNTER — Other Ambulatory Visit: Payer: Self-pay | Admitting: Internal Medicine

## 2011-09-15 DIAGNOSIS — C3490 Malignant neoplasm of unspecified part of unspecified bronchus or lung: Secondary | ICD-10-CM

## 2011-09-15 DIAGNOSIS — Z09 Encounter for follow-up examination after completed treatment for conditions other than malignant neoplasm: Secondary | ICD-10-CM | POA: Insufficient documentation

## 2011-09-15 DIAGNOSIS — Z85118 Personal history of other malignant neoplasm of bronchus and lung: Secondary | ICD-10-CM | POA: Insufficient documentation

## 2011-09-16 ENCOUNTER — Ambulatory Visit (HOSPITAL_BASED_OUTPATIENT_CLINIC_OR_DEPARTMENT_OTHER): Payer: Medicare Other | Admitting: Internal Medicine

## 2011-09-16 ENCOUNTER — Telehealth: Payer: Self-pay | Admitting: Internal Medicine

## 2011-09-16 ENCOUNTER — Other Ambulatory Visit (HOSPITAL_BASED_OUTPATIENT_CLINIC_OR_DEPARTMENT_OTHER): Payer: Medicare Other | Admitting: Lab

## 2011-09-16 ENCOUNTER — Other Ambulatory Visit: Payer: Self-pay | Admitting: Internal Medicine

## 2011-09-16 VITALS — BP 132/85 | HR 93 | Temp 96.8°F | Ht 66.0 in | Wt 168.2 lb

## 2011-09-16 DIAGNOSIS — C3490 Malignant neoplasm of unspecified part of unspecified bronchus or lung: Secondary | ICD-10-CM

## 2011-09-16 DIAGNOSIS — Z85118 Personal history of other malignant neoplasm of bronchus and lung: Secondary | ICD-10-CM

## 2011-09-16 LAB — COMPREHENSIVE METABOLIC PANEL
ALT: 18 U/L (ref 0–35)
AST: 25 U/L (ref 0–37)
Albumin: 4.4 g/dL (ref 3.5–5.2)
Alkaline Phosphatase: 86 U/L (ref 39–117)
BUN: 12 mg/dL (ref 6–23)
CO2: 28 mEq/L (ref 19–32)
Calcium: 9.4 mg/dL (ref 8.4–10.5)
Chloride: 101 mEq/L (ref 96–112)
Creatinine, Ser: 0.73 mg/dL (ref 0.50–1.10)
Glucose, Bld: 131 mg/dL — ABNORMAL HIGH (ref 70–99)
Potassium: 4 mEq/L (ref 3.5–5.3)
Sodium: 142 mEq/L (ref 135–145)
Total Bilirubin: 0.9 mg/dL (ref 0.3–1.2)
Total Protein: 6.6 g/dL (ref 6.0–8.3)

## 2011-09-16 LAB — CBC WITH DIFFERENTIAL/PLATELET
BASO%: 0.7 % (ref 0.0–2.0)
Basophils Absolute: 0 10*3/uL (ref 0.0–0.1)
EOS%: 1.5 % (ref 0.0–7.0)
Eosinophils Absolute: 0.1 10*3/uL (ref 0.0–0.5)
HCT: 46.8 % — ABNORMAL HIGH (ref 34.8–46.6)
HGB: 16 g/dL — ABNORMAL HIGH (ref 11.6–15.9)
LYMPH%: 37.3 % (ref 14.0–49.7)
MCH: 31.7 pg (ref 25.1–34.0)
MCHC: 34.3 g/dL (ref 31.5–36.0)
MCV: 92.5 fL (ref 79.5–101.0)
MONO#: 0.5 10*3/uL (ref 0.1–0.9)
MONO%: 7.3 % (ref 0.0–14.0)
NEUT#: 3.8 10*3/uL (ref 1.5–6.5)
NEUT%: 53.2 % (ref 38.4–76.8)
Platelets: 240 10*3/uL (ref 145–400)
RBC: 5.05 10*6/uL (ref 3.70–5.45)
RDW: 12.8 % (ref 11.2–14.5)
WBC: 7.2 10*3/uL (ref 3.9–10.3)
lymph#: 2.7 10*3/uL (ref 0.9–3.3)

## 2011-09-16 NOTE — Telephone Encounter (Signed)
gv pt appt schedule for nov 2013 and cxr referral for cxr @ [facility] same day as lb/fu. Per MM on lab in feb just w/fu 31yr.

## 2011-09-16 NOTE — Progress Notes (Signed)
OFFICE PROGRESS NOTE  Marga Melnick, MD, MD [phone] [facility] [facility] [address] [place] [address]  DIAGNOSIS: :  Stage IA (T1N0M0) non-small cell lung cancer, adenocarcinoma, diagnosed in December 2002.  PRIOR THERAPY: Status post right upper lobectomy under the care of Dr Edwyna Shell on October 04, 2001.  CURRENT THERAPY: Observation.   INTERVAL HISTORY: Maria [name] 75 y.o. female returns to the clinic today for an annual followup visit. She is feeling fine she denied having any significant complaints today. She has no weight loss or night sweats. She has no chest pain or shortness of breath. She has a repeat chest x-ray performed recently and she is here for evaluation and discussion of her imaging results.  MEDICAL HISTORY: Past Medical History  Diagnosis Date  . Neoplasm of lung, malignant     Non-small cell  . Diverticulosis   . Goiter, nodular     [place] of thyroid stable 7/08  . Osteopenia   . Vitamin D deficiency   . Mitral valve prolapse     ALLERGIES:  is allergic to sulfonamide derivatives.  MEDICATIONS:  Current Outpatient Prescriptions  Medication Sig Dispense Refill  . Calcium Carbonate-Vitamin D (CALCIUM 600+D) 600-400 MG-UNIT per tablet Take 2 tablets by mouth daily.        . fish oil-omega-3 fatty acids 1000 MG capsule Take 1 g by mouth daily.        . folic acid (FOLVITE) 800 MCG tablet Take 800 mcg by mouth daily.        . Multiple Vitamins-Minerals (MULTIVITAMIN WITH MINERALS) tablet Take 2 tablets by mouth daily.        . NON FORMULARY Esterol: 1 by mouth once daily.       . diazepam (VALIUM) 2 MG tablet Take 2 mg by mouth every 8 (eight) hours as needed.        . Glucosamine-Vitamin D 1000-200 MG-UNIT TABS Take 1 tablet by mouth 2 (two) times daily.          SURGICAL HISTORY:  Past Surgical History  Procedure Date  . Lobectomy 12/02    RU; no radiation or chemo, Dr. Edwyna Shell  . Colonoscopy 2009    Diverticulosis; Dr. Juanda Chance  . Tonsillectomy    . Rulobectomy   . Abdominal hysterectomy 1998    No BSO, for dysfunctional    REVIEW OF SYSTEMS:  A comprehensive review of systems was negative.   PHYSICAL EXAMINATION: General appearance: alert, cooperative and no distress Head: Normocephalic, without obvious abnormality, atraumatic Neck: no adenopathy Lymph nodes: Cervical, supraclavicular, and axillary nodes normal. Resp: clear to auscultation bilaterally Cardio: regular rate and rhythm, S1, S2 normal, no murmur, click, rub or gallop GI: soft, non-tender; bowel sounds normal; no masses,  no organomegaly Extremities: extremities normal, atraumatic, no cyanosis or edema Neurologic: Alert and oriented X 3, normal strength and tone. Normal symmetric reflexes. Normal coordination and gait  ECOG PERFORMANCE STATUS: 1 - Symptomatic but completely ambulatory  Blood pressure 132/85, pulse 93, temperature 96.8 F (36 C), temperature source Oral, height 5\' 6"  (1.676 m), weight 168 lb 3.2 oz (76.295 kg).  LABORATORY DATA: Lab Results  Component Value Date   WBC 13.0* 01/17/2010   HGB 16.0* 09/16/2011   HCT 46.8* 09/16/2011   MCV 92.5 09/16/2011   PLT 240 09/16/2011      Chemistry      Component Value Date/Time   NA 141 09/16/2010 0909   K 3.7 09/16/2010 0909   CL 105 09/16/2010  0909   CO2 23 09/16/2010 0909   BUN 15 09/16/2010 0909   CREATININE 0.67 09/16/2010 0909      Component Value Date/Time   CALCIUM 9.2 09/16/2010 0909   ALKPHOS 76 09/16/2010 0909   AST 36 09/16/2010 0909   ALT 35 09/16/2010 0909   BILITOT 0.5 09/16/2010 0909       RADIOGRAPHIC STUDIES: Dg Chest 2 View  09/15/2011  *RADIOLOGY REPORT*  Clinical Data: History of lung carcinoma, follow-up  CHEST - 2 VIEW  Comparison: Chest x-ray of 09/15/2010  Findings: The lungs are clear.  Postoperative changes on the right are stable with clips overlie the right hilum.  No evidence of recurrence of lung carcinoma is seen.  No adenopathy is noted.  The heart is  within normal limits in size.  There are degenerative changes in the lower thoracic spine.  IMPRESSION: Stable postop changes on the right.  No evidence of recurrence of lung carcinoma.  Original Report Authenticated By: Juline Patch, M.D.    ASSESSMENT: This is a pleasant 75 years old white female with history of stage IA non-small cell lung cancer diagnosed almost 10 years ago. She is status post right upper lobectomy. The patient is doing fine and she has no evidence for disease recurrence on the chest x-ray.   PLAN: I recommended for her continuous observation. I would see her back for followup visit in one year with repeat chest x-ray and blood work.  All questions were answered. The patient knows to call the clinic with any problems, questions or concerns. We can certainly see the patient much sooner if necessary.

## 2011-09-30 ENCOUNTER — Other Ambulatory Visit: Payer: Self-pay | Admitting: Obstetrics

## 2011-09-30 DIAGNOSIS — N63 Unspecified lump in unspecified breast: Secondary | ICD-10-CM

## 2011-10-02 ENCOUNTER — Ambulatory Visit
Admission: RE | Admit: 2011-10-02 | Discharge: 2011-10-02 | Disposition: A | Discharge status: Home / Self Care | Payer: Medicare Other | Source: Ambulatory Visit | Attending: Obstetrics | Admitting: Obstetrics

## 2011-10-02 ENCOUNTER — Other Ambulatory Visit: Payer: Self-pay | Admitting: Obstetrics

## 2011-10-02 DIAGNOSIS — N63 Unspecified lump in unspecified breast: Secondary | ICD-10-CM

## 2012-09-12 ENCOUNTER — Other Ambulatory Visit: Payer: Self-pay | Admitting: *Deleted

## 2012-09-12 DIAGNOSIS — C3490 Malignant neoplasm of unspecified part of unspecified bronchus or lung: Secondary | ICD-10-CM

## 2012-09-13 ENCOUNTER — Other Ambulatory Visit: Payer: Self-pay | Admitting: Internal Medicine

## 2012-09-13 ENCOUNTER — Ambulatory Visit (HOSPITAL_BASED_OUTPATIENT_CLINIC_OR_DEPARTMENT_OTHER): Payer: Medicare Other | Admitting: Internal Medicine

## 2012-09-13 ENCOUNTER — Ambulatory Visit (HOSPITAL_COMMUNITY)
Admission: RE | Admit: 2012-09-13 | Discharge: 2012-09-13 | Disposition: A | Discharge status: Home / Self Care | Payer: Medicare Other | Source: Ambulatory Visit | Attending: Internal Medicine | Admitting: Internal Medicine

## 2012-09-13 ENCOUNTER — Other Ambulatory Visit (HOSPITAL_BASED_OUTPATIENT_CLINIC_OR_DEPARTMENT_OTHER): Payer: Medicare Other

## 2012-09-13 VITALS — BP 135/85 | HR 80 | Temp 96.6°F | Resp 20 | Ht 66.0 in | Wt 170.1 lb

## 2012-09-13 DIAGNOSIS — C3490 Malignant neoplasm of unspecified part of unspecified bronchus or lung: Secondary | ICD-10-CM

## 2012-09-13 DIAGNOSIS — Z85118 Personal history of other malignant neoplasm of bronchus and lung: Secondary | ICD-10-CM

## 2012-09-13 LAB — CBC WITH DIFFERENTIAL/PLATELET
BASO%: 1 % (ref 0.0–2.0)
Basophils Absolute: 0.1 10*3/uL (ref 0.0–0.1)
EOS%: 1.7 % (ref 0.0–7.0)
Eosinophils Absolute: 0.1 10*3/uL (ref 0.0–0.5)
HCT: 45.3 % (ref 34.8–46.6)
HGB: 15.4 g/dL (ref 11.6–15.9)
LYMPH%: 33.7 % (ref 14.0–49.7)
MCH: 31.7 pg (ref 25.1–34.0)
MCHC: 34.1 g/dL (ref 31.5–36.0)
MCV: 93.1 fL (ref 79.5–101.0)
MONO#: 0.7 10*3/uL (ref 0.1–0.9)
MONO%: 8.7 % (ref 0.0–14.0)
NEUT#: 4.2 10*3/uL (ref 1.5–6.5)
NEUT%: 54.9 % (ref 38.4–76.8)
Platelets: 253 10*3/uL (ref 145–400)
RBC: 4.86 10*6/uL (ref 3.70–5.45)
RDW: 12.8 % (ref 11.2–14.5)
WBC: 7.6 10*3/uL (ref 3.9–10.3)
lymph#: 2.5 10*3/uL (ref 0.9–3.3)

## 2012-09-13 LAB — COMPREHENSIVE METABOLIC PANEL (CC13)
ALT: 25 U/L (ref 0–55)
AST: 25 U/L (ref 5–34)
Albumin: 3.7 g/dL (ref 3.5–5.0)
Alkaline Phosphatase: 76 U/L (ref 40–150)
BUN: 16 mg/dL (ref 7.0–26.0)
CO2: 28 mEq/L (ref 22–29)
Calcium: 9.5 mg/dL (ref 8.4–10.4)
Chloride: 104 mEq/L (ref 98–107)
Creatinine: 0.7 mg/dL (ref 0.6–1.1)
Glucose: 100 mg/dl — ABNORMAL HIGH (ref 70–99)
Potassium: 3.8 mEq/L (ref 3.5–5.1)
Sodium: 140 mEq/L (ref 136–145)
Total Bilirubin: 0.94 mg/dL (ref 0.20–1.20)
Total Protein: 6.5 g/dL (ref 6.4–8.3)

## 2012-09-13 NOTE — Patient Instructions (Signed)
No evidence for disease recurrence on the chest x-ray. Followup with her primary care physician. I would see an as-needed basis

## 2012-09-13 NOTE — Progress Notes (Signed)
[facility] [phone]   [phone]  OFFICE PROGRESS NOTE  Marga Melnick, MD [phone] [address] [facility] [address] [place] [address]  DIAGNOSIS: : Stage IA (T1N0M0) non-small cell lung cancer, adenocarcinoma, diagnosed in December 2002.   PRIOR THERAPY: Status post right upper lobectomy under the care of Dr Edwyna Shell on October 04, 2001.   CURRENT THERAPY: Observation.   INTERVAL HISTORY: Maria [name] 76 y.o. female returns to the clinic today for annual followup visit. The patient is feeling fine today with no specific complaints. She denied having any significant chest pain, shortness breath, cough or hemoptysis. She denied having any significant weight loss or night sweats. She had repeat chest x-ray performed recently and she is here for evaluation and discussion of her imaging and lab results.  MEDICAL HISTORY: Past Medical History  Diagnosis Date  . Neoplasm of lung, malignant     Non-small cell  . Diverticulosis   . Goiter, nodular     [place] of thyroid stable 7/08  . Osteopenia   . Vitamin d deficiency   . Mitral valve prolapse     ALLERGIES:  is allergic to sulfonamide derivatives.  MEDICATIONS:  Current Outpatient Prescriptions  Medication Sig Dispense Refill  . Calcium Carbonate-Vitamin D (CALCIUM 600+D) 600-400 MG-UNIT per tablet Take 2 tablets by mouth daily.          SURGICAL HISTORY:  Past Surgical History  Procedure Date  . Lobectomy 12/02    RU; no radiation or chemo, Dr. Edwyna Shell  . Colonoscopy 2009    Diverticulosis; Dr. Juanda Chance  . Tonsillectomy   . Rulobectomy   . Abdominal hysterectomy 1998    No BSO, for dysfunctional    REVIEW OF SYSTEMS:  A comprehensive review of systems was negative.   PHYSICAL EXAMINATION: General appearance: alert, cooperative and no distress Head: Normocephalic, without obvious abnormality, atraumatic Neck: no adenopathy Resp: clear to auscultation bilaterally Cardio:  regular rate and rhythm, S1, S2 normal, no murmur, click, rub or gallop GI: soft, non-tender; bowel sounds normal; no masses,  no organomegaly Extremities: extremities normal, atraumatic, no cyanosis or edema  ECOG PERFORMANCE STATUS: 0 - Asymptomatic  There were no vitals taken for this visit.  LABORATORY DATA: Lab Results  Component Value Date   WBC 7.6 09/13/2012   HGB 15.4 09/13/2012   HCT 45.3 09/13/2012   MCV 93.1 09/13/2012   PLT 253 09/13/2012      Chemistry      Component Value Date/Time   NA 142 09/16/2011 1016   K 4.0 09/16/2011 1016   CL 101 09/16/2011 1016   CO2 28 09/16/2011 1016   BUN 12 09/16/2011 1016   CREATININE 0.73 09/16/2011 1016      Component Value Date/Time   CALCIUM 9.4 09/16/2011 1016   ALKPHOS 86 09/16/2011 1016   AST 25 09/16/2011 1016   ALT 18 09/16/2011 1016   BILITOT 0.9 09/16/2011 1016       RADIOGRAPHIC STUDIES: Dg Chest 2 View  09/13/2012  *RADIOLOGY REPORT*  Clinical Data: June 8 lung cancer, no new problems  CHEST - 2 VIEW  Comparison: 09/15/2011  Findings: Hyperinflation indicates COPD.  Heart size and vascular pattern are normal.  There is surgical change in the right hilum. This is unchanged.  There is no abnormal pulmonary parenchymal opacity.  There are no pleural effusions.  IMPRESSION: No acute findings.  No change from prior study.   Original Report Authenticated By:  Esperanza Heir, M.D.     ASSESSMENT: This is a very pleasant 76 years old white female with history of stage IA non-small cell lung cancer, adenocarcinoma diagnosed in December of 2002 status post right upper lobectomy and has been on observation since that time was no evidence for disease recurrence.  PLAN: I discussed the imaging and lab result with the patient. I recommended for her to continue on observation for now. She finally decided to keep her routine followup visit with her primary care physician and she would see me on as-needed basis at this  point. She was advised to call immediately if she has any concerning symptoms in the interval.  All questions were answered. The patient knows to call the clinic with any problems, questions or concerns. We can certainly see the patient much sooner if necessary.

## 2012-09-19 ENCOUNTER — Encounter: Payer: Self-pay | Admitting: *Deleted

## 2012-10-28 ENCOUNTER — Encounter: Payer: Self-pay | Admitting: Internal Medicine

## 2013-02-20 ENCOUNTER — Encounter: Payer: Self-pay | Admitting: Internal Medicine

## 2013-05-02 ENCOUNTER — Ambulatory Visit (AMBULATORY_SURGERY_CENTER): Payer: Medicare Other | Admitting: *Deleted

## 2013-05-02 VITALS — Ht 65.5 in | Wt 169.4 lb

## 2013-05-02 DIAGNOSIS — Z800 Family history of malignant neoplasm of digestive organs: Secondary | ICD-10-CM

## 2013-05-02 MED ORDER — MOVIPREP 100 G PO SOLR: ORAL | Status: DC

## 2013-05-12 ENCOUNTER — Ambulatory Visit (AMBULATORY_SURGERY_CENTER): Payer: Medicare Other | Admitting: Internal Medicine

## 2013-05-12 ENCOUNTER — Encounter: Payer: Self-pay | Admitting: Internal Medicine

## 2013-05-12 VITALS — BP 128/83 | HR 157 | Temp 97.8°F | Resp 18 | Ht 65.0 in | Wt 169.0 lb

## 2013-05-12 DIAGNOSIS — Z1211 Encounter for screening for malignant neoplasm of colon: Secondary | ICD-10-CM

## 2013-05-12 DIAGNOSIS — Z800 Family history of malignant neoplasm of digestive organs: Secondary | ICD-10-CM

## 2013-05-12 MED ORDER — SODIUM CHLORIDE 0.9 % IV SOLN: 500.0000 mL | INTRAVENOUS | Status: DC

## 2013-05-12 NOTE — Progress Notes (Signed)
Patient did not experience any of the following events: a burn prior to discharge; a fall within the facility; wrong site/side/patient/procedure/implant event; or a hospital transfer or hospital admission upon discharge from the facility. (G8907) Patient did not have preoperative order for IV antibiotic SSI prophylaxis. (G8918)  

## 2013-05-12 NOTE — Op Note (Signed)
[facility] [address] [place], [address]   COLONOSCOPY PROCEDURE REPORT  PATIENT: Maria [name], Maria [name]  MR#: [id number] BIRTHDATE: Jun 17, 1936 , 77  yrs. old GENDER: Female ENDOSCOPIST: Hart Carwin, MD REFERRED BY:  recall colonoscopy PROCEDURE DATE:  05/12/2013 PROCEDURE:   Colonoscopy, screening ASA CLASS:   Class II INDICATIONS:Patient's immediate family history of colon cancer and colonoscopy, 2005,,2008, mother with colon cancer. MEDICATIONS: MAC sedation, administered by CRNA and propofol (Diprivan) 200mg  IV  DESCRIPTION OF PROCEDURE:   After the risks and benefits and of the procedure were explained, informed consent was obtained.  A digital rectal exam revealed no abnormalities of the rectum.    The LB [id number]  endoscope was introduced through the anus and advanced to the cecum, which was identified by both the appendix and ileocecal valve .  The quality of the prep was good, using MoviPrep .  The instrument was then slowly withdrawn as the colon was fully examined.     COLON FINDINGS: There was moderate diverticulosis noted in the sigmoid colon with associated muscular hypertrophy and tortuosity. Retroflexed views revealed no abnormalities.     The scope was then withdrawn from the patient and the procedure completed.  COMPLICATIONS: There were no complications. ENDOSCOPIC IMPRESSION: There was moderate diverticulosis noted in the sigmoid colon  RECOMMENDATIONS: High fiber diet   REPEAT EXAM: In 5 year(s)  for Colonoscopy.,will reconsider if significant medical issues  cc:  _______________________________ eSigned:  Hart Carwin, MD 05/12/2013 8:33 AM

## 2013-05-12 NOTE — Patient Instructions (Addendum)

## 2013-05-12 NOTE — Progress Notes (Signed)
Stable to RR 

## 2013-05-15 ENCOUNTER — Telehealth: Payer: Self-pay | Admitting: *Deleted

## 2013-05-15 NOTE — Telephone Encounter (Signed)
  Follow up Call-  Call back number 05/12/2013  Post procedure Call Back phone  # [phone]  Permission to leave phone message Yes     Patient questions:  Do you have a fever, pain , or abdominal swelling? no Pain Score  0 *  Have you tolerated food without any problems? yes  Have you been able to return to your normal activities? yes  Do you have any questions about your discharge instructions: Diet   no Medications  no Follow up visit  no  Do you have questions or concerns about your Care? no  Actions: * If pain score is 4 or above: No action needed, pain <4.

## 2013-07-13 ENCOUNTER — Encounter: Payer: Self-pay | Admitting: Internal Medicine

## 2013-07-13 ENCOUNTER — Ambulatory Visit (INDEPENDENT_AMBULATORY_CARE_PROVIDER_SITE_OTHER)
Admission: RE | Admit: 2013-07-13 | Discharge: 2013-07-13 | Disposition: A | Discharge status: Home / Self Care | Payer: Medicare Other | Source: Ambulatory Visit | Attending: Internal Medicine | Admitting: Internal Medicine

## 2013-07-13 ENCOUNTER — Ambulatory Visit (INDEPENDENT_AMBULATORY_CARE_PROVIDER_SITE_OTHER): Payer: Medicare Other | Admitting: Internal Medicine

## 2013-07-13 VITALS — BP 146/85 | HR 74 | Temp 98.1°F | Ht 65.5 in | Wt 167.0 lb

## 2013-07-13 DIAGNOSIS — IMO0002 Reserved for concepts with insufficient information to code with codable children: Secondary | ICD-10-CM

## 2013-07-13 DIAGNOSIS — M545 Low back pain, unspecified: Secondary | ICD-10-CM

## 2013-07-13 DIAGNOSIS — M1710 Unilateral primary osteoarthritis, unspecified knee: Secondary | ICD-10-CM

## 2013-07-13 DIAGNOSIS — M899 Disorder of bone, unspecified: Secondary | ICD-10-CM

## 2013-07-13 DIAGNOSIS — E785 Hyperlipidemia, unspecified: Secondary | ICD-10-CM

## 2013-07-13 DIAGNOSIS — Z85118 Personal history of other malignant neoplasm of bronchus and lung: Secondary | ICD-10-CM

## 2013-07-13 DIAGNOSIS — E049 Nontoxic goiter, unspecified: Secondary | ICD-10-CM

## 2013-07-13 DIAGNOSIS — Z0000 Encounter for general adult medical examination without abnormal findings: Secondary | ICD-10-CM

## 2013-07-13 DIAGNOSIS — M1711 Unilateral primary osteoarthritis, right knee: Secondary | ICD-10-CM

## 2013-07-13 DIAGNOSIS — R7309 Other abnormal glucose: Secondary | ICD-10-CM

## 2013-07-13 DIAGNOSIS — E559 Vitamin D deficiency, unspecified: Secondary | ICD-10-CM

## 2013-07-13 NOTE — Patient Instructions (Addendum)
Order for x-rays entered into  the computer; these will be performed at [address] across from [facility]. No appointment is necessary.  If you activate the  My Chart system; lab & Xray results will be released directly  to you as soon as I review & address these through the computer. If you choose not to sign up for My Chart within 36 hours of labs being drawn; results will be reviewed & interpretation added before being copied & mailed, causing a delay in getting the results to you.If you do not receive that report within 7-10 days ,please call. Additionally you can use this system to gain direct  access to your records  if  out of town or @ an office of a  physician who is not in  the My Chart network.  This improves continuity of care & places you in control of your medical record.  

## 2013-07-13 NOTE — Progress Notes (Signed)
Subjective:    Patient ID: Maria [name], female    DOB: Nov 10, 1935, 77 y.o.   MRN: [id number]  HPI Medicare Wellness Visit:  Psychosocial & medical history were reviewed as required by Medicare (abuse,antisocial behavioral risks,firearm risk).  Social history: caffeine: 2 cups coffee/ day  , alcohol: 7 drinks / week  ,  tobacco use: quit 1980   Exercise :  no Home & personal  safety / fall risk:no Limitations of activities of daily living:no Seatbelt  and smoke alarm use: yes Power of Attorney/Living Will status : in place Ophthalmology exam status :current Hearing evaluation status: not currrent Orientation :oriented X 3 Memory & recall :good Math testing:"test phobic" Active depression / anxiety:some adjustment issues with return to  [place] Foreign travel history : [place] 2012 Immunization status for Shingles /Flu/ PNA/ tetanus :not taken , SOC reviewed Transfusion history:  no Preventive health surveillance status of colonoscopy, BMD , mammograms,PAP as per protocol/ SOC: BMD due Dental care:  Every 6- 12  mos Chart reviewed &  Updated. Active issues reviewed & addressed.      Review of Systems    She describes bilateral lumbosacral area discomfort for approximately 4 months which is described as aching/throbbing. It is constant if she is standing. It is better if she sits or leans forward. She takes 2 Advil each morning with some benefit  She denies associated numbness, tingling, or weakness in her legs. She has no urinary or stool incontinence.  She denies fever chills, or sweats. She has had some purposeful weight loss  She does have a past history of lung cancer but has been released by her Oncologist as of 2013.      Objective:   Physical Exam Gen.: Healthy and well-nourished in appearance. Alert, appropriate and cooperative throughout exam.Appears younger than stated age  Head: Normocephalic without obvious abnormalities  Eyes: No corneal or conjunctival  inflammation noted. Pupils equal round reactive to light and accommodation.  Extraocular motion intact. Vision grossly normal with lenses Ears: External  ear exam reveals no significant lesions or deformities. Canals clear .TMs normal. Hearing is grossly normal bilaterally. Nose: External nasal exam reveals no deformity or inflammation. Nasal mucosa are pink and moist. No lesions or exudates noted.  Mouth: Oral mucosa and oropharynx reveal no lesions or exudates. Teeth in good repair. Neck: No deformities, masses, or tenderness noted. Range of motion good. Thyroid : Right lobe is firm without definite nodularity. Lungs: Normal respiratory effort; chest expands symmetrically. Lungs are clear to auscultation without rales, wheezes, or increased work of breathing. Heart: Normal rate and rhythm. Normal S1 and S2. No gallop, click, or rub. No murmur. Abdomen: Bowel sounds normal; abdomen soft and nontender. No masses, organomegaly or hernias noted. Genitalia: As per Gyn                                  Musculoskeletal/extremities: No deformity or scoliosis noted of  the thoracic or lumbar spine.  No clubbing, cyanosis, edema, or significant extremity  deformity noted. Range of motion normal .Tone & strength  Normal. Joints reveal mild  DJD DIP changes. Nail health good. Significant crepitus of the right knee without associated effusion. Able to lie down & sit up w/o help. Negative SLR bilaterally Vascular: Carotid, radial artery, dorsalis pedis and  posterior tibial pulses are full and equal. No bruits present. Neurologic: Alert and oriented x3. Deep tendon reflexes symmetrical and normal.  Gait normal  including heel & toe walking .        Skin: Intact without suspicious lesions or rashes. Lymph: No cervical, axillary lymphadenopathy present. Psych: Mood and affect are normal. Normally interactive                                                                                        Assessment  & Plan:  #1 Medicare Wellness Exam; criteria met ; data entered #2 low back pain; positionality suggests probable spinal stenosis  #3 degenerative changes right knee  Plan:  Assessments made/ Orders entered

## 2013-07-14 ENCOUNTER — Other Ambulatory Visit (INDEPENDENT_AMBULATORY_CARE_PROVIDER_SITE_OTHER): Payer: Medicare Other

## 2013-07-14 DIAGNOSIS — R7309 Other abnormal glucose: Secondary | ICD-10-CM

## 2013-07-14 DIAGNOSIS — E785 Hyperlipidemia, unspecified: Secondary | ICD-10-CM

## 2013-07-14 DIAGNOSIS — M899 Disorder of bone, unspecified: Secondary | ICD-10-CM

## 2013-07-14 DIAGNOSIS — E559 Vitamin D deficiency, unspecified: Secondary | ICD-10-CM

## 2013-07-14 LAB — BASIC METABOLIC PANEL
BUN: 12 mg/dL (ref 6–23)
CO2: 28 mEq/L (ref 19–32)
Calcium: 9.3 mg/dL (ref 8.4–10.5)
Chloride: 101 mEq/L (ref 96–112)
Creatinine, Ser: 0.6 mg/dL (ref 0.4–1.2)
GFR: 95.99 mL/min (ref >60.00)
Glucose, Bld: 87 mg/dL (ref 70–99)
Potassium: 3.8 mEq/L (ref 3.5–5.1)
Sodium: 138 mEq/L (ref 135–145)

## 2013-07-14 LAB — LIPID PANEL
Cholesterol: 199 mg/dL (ref 0–200)
HDL: 60.5 mg/dL (ref >39.00)
LDL Cholesterol: 114 mg/dL — ABNORMAL HIGH (ref 0–99)
Total CHOL/HDL Ratio: 3
Triglycerides: 123 mg/dL (ref 0.0–149.0)
VLDL: 24.6 mg/dL (ref 0.0–40.0)

## 2013-07-14 LAB — HEMOGLOBIN A1C: Hgb A1c MFr Bld: 5.8 % (ref 4.6–6.5)

## 2013-07-17 ENCOUNTER — Telehealth: Payer: Self-pay | Admitting: *Deleted

## 2013-07-17 NOTE — Telephone Encounter (Signed)
Spoke with pt and advised of results of labwork and imaging.

## 2013-07-17 NOTE — Telephone Encounter (Signed)
Message copied by Baldwin Jamaica on Mon Jul 17, 2013  3:19 PM ------      Message from: Pecola Lawless      Created: Fri Jul 14, 2013  5:28 PM       LDL or BAD cholesterol is mildly elevated , but the HDL (GOOD) > 50 is protective.       No Diabetes risk present if A1c is < 6.1% .       All other lab results are excellent. Hopp       ------

## 2013-07-19 LAB — VITAMIN D 1,25 DIHYDROXY
Vitamin D 1, 25 (OH)2 Total: 42 pg/mL (ref 18–72)
Vitamin D2 1, 25 (OH)2: <8 pg/mL
Vitamin D3 1, 25 (OH)2: 42 pg/mL

## 2013-07-25 ENCOUNTER — Telehealth: Payer: Self-pay | Admitting: Internal Medicine

## 2013-07-25 NOTE — Telephone Encounter (Signed)
Patient is calling to request having a Dpt vaccine today. Her grandchild is being born today and she wants to come in to have this before she goes to be around the baby this afternoon. Please advise.

## 2013-07-27 ENCOUNTER — Telehealth: Payer: Self-pay | Admitting: *Deleted

## 2013-07-27 NOTE — Telephone Encounter (Signed)
Called and left message for patient to advise her that she is overdue for a tetanus vaccine and that she should call her insurance company to see if it would be covered.

## 2013-07-28 NOTE — Telephone Encounter (Signed)
Lm @ (1:23pm) asking the pt to RTC regarding having Tdap injection.//AB/CMA

## 2013-08-01 NOTE — Telephone Encounter (Signed)
Spoke with the pt and informed her that when she is ready she can call and schedule an appt with the nurse to have a Tdap.  Pt stated that she is out of town right now but when she comes back she will call.//AB/CMA

## 2013-11-14 ENCOUNTER — Ambulatory Visit (INDEPENDENT_AMBULATORY_CARE_PROVIDER_SITE_OTHER): Payer: Medicare Other | Admitting: Internal Medicine

## 2013-11-14 ENCOUNTER — Encounter: Payer: Self-pay | Admitting: Internal Medicine

## 2013-11-14 VITALS — BP 135/88 | HR 80 | Temp 98.6°F | Resp 13 | Ht 65.5 in | Wt 166.4 lb

## 2013-11-14 DIAGNOSIS — IMO0002 Reserved for concepts with insufficient information to code with codable children: Secondary | ICD-10-CM

## 2013-11-14 DIAGNOSIS — M1711 Unilateral primary osteoarthritis, right knee: Secondary | ICD-10-CM

## 2013-11-14 DIAGNOSIS — E049 Nontoxic goiter, unspecified: Secondary | ICD-10-CM

## 2013-11-14 DIAGNOSIS — M1990 Unspecified osteoarthritis, unspecified site: Secondary | ICD-10-CM

## 2013-11-14 DIAGNOSIS — E785 Hyperlipidemia, unspecified: Secondary | ICD-10-CM

## 2013-11-14 DIAGNOSIS — C3490 Malignant neoplasm of unspecified part of unspecified bronchus or lung: Secondary | ICD-10-CM

## 2013-11-14 DIAGNOSIS — M1710 Unilateral primary osteoarthritis, unspecified knee: Secondary | ICD-10-CM

## 2013-11-14 DIAGNOSIS — Z23 Encounter for immunization: Secondary | ICD-10-CM

## 2013-11-14 HISTORY — DX: Unspecified osteoarthritis, unspecified site: M19.90

## 2013-11-14 LAB — TSH: TSH: 1.52 u[IU]/mL (ref 0.35–5.50)

## 2013-11-14 NOTE — Addendum Note (Signed)
Addended by: Harl Bowie on: 11/14/2013 10:33 AM   Modules accepted: Orders

## 2013-11-14 NOTE — Progress Notes (Signed)
Pre visit review using our clinic review tool, if applicable. No additional management support is needed unless otherwise documented below in the visit note. 

## 2013-11-14 NOTE — Assessment & Plan Note (Signed)
Lipids at goal; statin not indicated. Heart healthy diet and exercise once the issue addressed should suffice

## 2013-11-14 NOTE — Progress Notes (Signed)
Subjective:    Patient ID: Maria [name], female    DOB: Mar 30, 1936, 78 y.o.   MRN: [id number]  HPI  Total knee replacement on the right is scheduled tentatively 12/11/13 by Dr.Alusio. She has end-stage degenerative joint disease with pain upon ambulation. The pain can be up to a level VI. She does find that to ibuprofen are of benefit. She is no longer able to hike and she is now having associated low back discomfort .  Remotely in 2010 she had steroid injections x3 which were not effective. She's been in physical therapy during the month of December 2014.  Significant past history includes a non-small cell lung cancer for which she had a lobectomy. She had no chemotherapy or radiation. She was released from followup by her oncologist in 2013.  A heart healthy diet is followed; exercise encompasses 40 minutes 7  times per week as walking dog tid  without symptoms. No statin to date. Low dose ASA not taken. In September 2014 her HDL was 60.5 and LDL minimally elevated at 114.    Review of Systems Specifically denied are  chest pain, palpitations, dyspnea, or claudication.   She denies any cough, sputum, hemoptysis, or unexplained weight loss.      Objective:   Physical Exam Gen.: Healthy and well-nourished in appearance. Alert, appropriate and cooperative throughout exam.Appears younger than stated age  Head: Normocephalic without obvious abnormalities  Eyes: No corneal or conjunctival inflammation noted. Pupils equal round reactive to light and accommodation. Nose: External nasal exam reveals no deformity or inflammation. Nasal mucosa are pink and moist. No lesions or exudates noted.   Mouth: Oral mucosa and oropharynx reveal no lesions or exudates. Teeth in good repair. Neck: No deformities, masses, or tenderness noted. Thyroid : smooth goiter on R. Lungs: Normal respiratory effort; chest expands symmetrically. Lungs are clear to auscultation without rales, wheezes, or increased  work of breathing. Heart: Normal rate and rhythm. Normal S1 and S2. No gallop, click, or rub.Soft S4 ; no murmur. Abdomen: Bowel sounds normal; abdomen soft and nontender. No masses, organomegaly or hernias noted. Genitalia:  as per Gyn                                  Musculoskeletal/extremities: Accentuated curvature of upper thoracic spine.  No clubbing, cyanosis, or edema noted. Range of motion normal .Tone & strength normal. Hand joints reveal   DJD DIP changes. Fingernail health good. Able to lie down & sit up w/o help. Negative SLR bilaterally Vascular: Carotid, radial artery, dorsalis pedis and  posterior tibial pulses are full and equal. No bruits present. Neurologic: Alert and oriented x3. Deep tendon reflexes symmetrical and normal.      Skin: Intact without suspicious lesions or rashes. Lymph: No cervical, axillary lymphadenopathy present. Psych: Mood and affect are normal. Normally interactive                                                                                        Assessment & Plan:  See Current Assessment & Plan in Problem List under specific  Diagnosis

## 2013-11-14 NOTE — Patient Instructions (Addendum)
Please see preop clearance note and labs. Patient is medically cleared for surgery.  Your next office appointment will be determined based upon review of your pending labs . Those instructions will be transmitted to you through My Chart .

## 2013-11-14 NOTE — Assessment & Plan Note (Signed)
No contraindication to planned surgery; medically cleared

## 2013-11-14 NOTE — Assessment & Plan Note (Signed)
Repeat ultrasound recommended 06/2014

## 2013-11-14 NOTE — Assessment & Plan Note (Signed)
No evidence recurrence 2002-2013; need for preop x-ray deferred to Anesthesia

## 2013-11-20 ENCOUNTER — Telehealth: Payer: Self-pay | Admitting: Internal Medicine

## 2013-11-20 NOTE — Telephone Encounter (Signed)
Patient called to schedule appt for pneumonia shot. Okay to schedule?

## 2013-11-20 NOTE — Telephone Encounter (Signed)
MSG left to call the office      KP 

## 2013-11-20 NOTE — Telephone Encounter (Signed)
Please advise if Pneumonia Vaccine ok and whether you would like the 23 or 13? KP

## 2013-11-20 NOTE — Telephone Encounter (Signed)
Go by insurance coverage

## 2013-11-21 ENCOUNTER — Ambulatory Visit (INDEPENDENT_AMBULATORY_CARE_PROVIDER_SITE_OTHER): Payer: Medicare Other | Admitting: *Deleted

## 2013-11-21 ENCOUNTER — Other Ambulatory Visit: Payer: Self-pay | Admitting: Orthopedic Surgery

## 2013-11-21 DIAGNOSIS — Z23 Encounter for immunization: Secondary | ICD-10-CM

## 2013-11-30 ENCOUNTER — Encounter (HOSPITAL_COMMUNITY): Payer: Self-pay | Admitting: Pharmacy Technician

## 2013-12-01 ENCOUNTER — Other Ambulatory Visit (HOSPITAL_COMMUNITY): Payer: Self-pay | Admitting: Orthopedic Surgery

## 2013-12-01 NOTE — Patient Instructions (Addendum)
[place]y  12/01/2013   Your procedure is scheduled on: Monday February 9th  Report to [facility] at 1045 AM.  Call this number if you have problems the morning of surgery [phone]   Remember: NO VISITORS UNDER AGE 76 PER [place].              You blood type will be drawn morning of surgery in short stay  Do not eat food  :After Midnight.   clear liquids midnight until 745 am day of surgery, then nothing by mouth.   Take these medicines the morning of surgery with A SIP OF WATER:  No meds to take                                SEE [place] may not have any metal on your body including hair pins and piercings  Do not wear jewelry, make-up.  Do not wear lotions, powders, or perfumes. No  Deodorant is to be worn.   Men may shave face and neck.  Do not bring valuables to the hospital. [place].  Contacts, dentures or bridgework may not be worn into surgery.  Leave suitcase in the car. After surgery it may be brought to your room.  For patients admitted to the hospital, checkout time is 11:00 AM the day of discharge. Please read over the following fact sheets that you were given: [facility] Preparing for surgery sheet, MRSA information, blood fact sheet, incentive spirometer sheet, clear liquid sheet  Call Zelphia Cairo RN pre op nurse if needed [phone]    [place].  PATIENT SIGNATURE___________________________________________  NURSE SIGNATURE_____________________________________________

## 2013-12-01 NOTE — Progress Notes (Signed)
Medical clearance note dr hopper on chart

## 2013-12-04 ENCOUNTER — Encounter (HOSPITAL_COMMUNITY): Payer: Self-pay

## 2013-12-04 ENCOUNTER — Encounter (HOSPITAL_COMMUNITY)
Admission: RE | Admit: 2013-12-04 | Discharge: 2013-12-04 | Disposition: A | Discharge status: Home / Self Care | Payer: Medicare Other | Source: Ambulatory Visit | Attending: Orthopedic Surgery | Admitting: Orthopedic Surgery

## 2013-12-04 DIAGNOSIS — Z01812 Encounter for preprocedural laboratory examination: Secondary | ICD-10-CM | POA: Insufficient documentation

## 2013-12-04 HISTORY — DX: Other complications of anesthesia, initial encounter: T88.59XA

## 2013-12-04 HISTORY — DX: Other specified postprocedural states: R11.2

## 2013-12-04 HISTORY — DX: Other specified postprocedural states: Z98.890

## 2013-12-04 HISTORY — DX: Adverse effect of unspecified anesthetic, initial encounter: T41.45XA

## 2013-12-04 HISTORY — DX: Cardiac murmur, unspecified: R01.1

## 2013-12-04 LAB — COMPREHENSIVE METABOLIC PANEL
ALT: 19 U/L (ref 0–35)
AST: 28 U/L (ref 0–37)
Albumin: 3.9 g/dL (ref 3.5–5.2)
Alkaline Phosphatase: 98 U/L (ref 39–117)
BUN: 17 mg/dL (ref 6–23)
CO2: 29 mEq/L (ref 19–32)
Calcium: 8.9 mg/dL (ref 8.4–10.5)
Chloride: 101 mEq/L (ref 96–112)
Creatinine, Ser: 0.59 mg/dL (ref 0.50–1.10)
GFR calc Af Amer: >90 mL/min (ref >90)
GFR calc non Af Amer: 87 mL/min — ABNORMAL LOW (ref >90)
Glucose, Bld: 94 mg/dL (ref 70–99)
Potassium: 4.1 mEq/L (ref 3.7–5.3)
Sodium: 141 mEq/L (ref 137–147)
Total Bilirubin: 0.7 mg/dL (ref 0.3–1.2)
Total Protein: 7 g/dL (ref 6.0–8.3)

## 2013-12-04 LAB — CBC
HCT: 43.3 % (ref 36.0–46.0)
Hemoglobin: 14.9 g/dL (ref 12.0–15.0)
MCH: 31.3 pg (ref 26.0–34.0)
MCHC: 34.4 g/dL (ref 30.0–36.0)
MCV: 91 fL (ref 78.0–100.0)
Platelets: 259 10*3/uL (ref 150–400)
RBC: 4.76 MIL/uL (ref 3.87–5.11)
RDW: 12.6 % (ref 11.5–15.5)
WBC: 8.9 10*3/uL (ref 4.0–10.5)

## 2013-12-04 LAB — URINALYSIS, ROUTINE W REFLEX MICROSCOPIC
Bilirubin Urine: NEGATIVE
Glucose, UA: NEGATIVE mg/dL
Hgb urine dipstick: NEGATIVE
Ketones, ur: NEGATIVE mg/dL
Leukocytes, UA: NEGATIVE
Nitrite: NEGATIVE
Protein, ur: NEGATIVE mg/dL
Specific Gravity, Urine: 1.005 (ref 1.005–1.030)
Urobilinogen, UA: 0.2 mg/dL (ref 0.0–1.0)
pH: 7 (ref 5.0–8.0)

## 2013-12-04 LAB — SURGICAL PCR SCREEN
MRSA, PCR: NEGATIVE
Staphylococcus aureus: NEGATIVE

## 2013-12-04 LAB — PROTIME-INR
INR: 0.91 (ref 0.00–1.49)
Prothrombin Time: 12.1 seconds (ref 11.6–15.2)

## 2013-12-04 LAB — APTT: aPTT: 30 seconds (ref 24–37)

## 2013-12-10 ENCOUNTER — Other Ambulatory Visit: Payer: Self-pay | Admitting: Orthopedic Surgery

## 2013-12-10 NOTE — H&P (Signed)
Maria [name]  DOB: Mar 14, 1936 Divorced / Language: Cleophus Molt / Race: White Female  Date of Admission:  12-11-2012  Chief Complaint:  Right Knee Pain  History of Present Illness The patient is a 78 year old female who comes in for a preoperative History and Physical. The patient is scheduled for a right total knee arthroplasty to be performed by Dr. Dione Plover. Aluisio, MD at [facility] on 12-11-2013. The patient is a 78 year old female who presents with knee complaints. The patient is seen for a second opinion. The patient reports right knee symptoms including: pain which began 15 year(s) (or more) ago without any known injury. Prior to being seen the patient was previously evaluated by a colleague. Previous work-up for this problem has included knee x-rays. Past treatment for this problem has included intra-articular injection of corticosteroids (at [facility]). Note for "Knee pain": She did have a patellar fracture, January a year ago. She recently moved back here from [place], [place]. She has had problems with the knee for many years. She has had a progressive deformity in the knee. She has also had progressive pain. The pain is limiting what she can and cannot do. She has had injections in the past which are no longer beneficial. She is at a stage where she wants a more permanent solution to her knee problem. She has had a progressive valgus deformity and the knee is starting to give out on her more. She is concerned about falling. She is ready to get the right knee fixed. They have been treated conservatively in the past for the above stated problem and despite conservative measures, they continue to have progressive pain and severe functional limitations and dysfunction. They have failed non-operative management including home exercise, medications, and injections. It is felt that they would benefit from undergoing total joint replacement. Risks and benefits of the procedure  have been discussed with the patient and they elect to proceed with surgery. There are no active contraindications to surgery such as ongoing infection or rapidly progressive neurological disease.    Allergies Sulfanilamide *CHEMICALS*. Sickness   Problem List/Past Medical History Primary osteoarthritis of one knee (715.16) Chronic Pain Heart murmur secondary to Rheumatic Fever as a child Lung Cancer   Family History Cerebrovascular Accident. Father. Chronic Obstructive Lung Disease. Mother. First Degree Relatives. reported Cancer. Father. Osteoarthritis. Father, Sister. Heart Disease. Father. Heart disease in female family member before age 58 Hypertension. Father.    Social History Living situation. live alone Marital status. divorced No history of drug/alcohol rehab Exercise. Exercises daily; does running / walking Children. 2 Current drinker. 09/21/2013: Currently drinks wine less than 5 times per week Current work status. retired Tobacco use. Former smoker. 09/21/2013: smoke(d) 1/2 pack(s) per day Not under pain contract Number of flights of stairs before winded. 1 Tobacco / smoke exposure. 09/21/2013: no Post-Surgical Plans. Plan is for [facility]    Medication History Fish Oil Burp-Less ( Oral) Specific dose unknown - Active. Calcium Carbonate ( Oral) Specific dose unknown - Active. (liquid/unsure of doseage) Vitamin D ( Oral) Specific dose unknown - Active. Multivitamin ( Oral) Active. Folic Acid ( Oral) Specific dose unknown - Active. Advil (200MG  Tablet, Oral) Active.   Past Surgical History Breast Biopsy. right Hysterectomy. partial (non-cancerous) Lung Surgery . right   Review of Systems General:Not Present- Chills, Fever, Night Sweats, Fatigue, Weight Gain, Weight Loss and Memory Loss. Skin:Not Present- Hives, Itching, Rash, Eczema and Lesions. HEENT:Not Present- Tinnitus, Headache, Double  Vision, Visual  Loss, Hearing Loss and Dentures. Respiratory:Not Present- Shortness of breath with exertion, Shortness of breath at rest, Allergies, Coughing up blood and Chronic Cough. Cardiovascular:Not Present- Chest Pain, Racing/skipping heartbeats, Difficulty Breathing Lying Down, Murmur, Swelling and Palpitations. Gastrointestinal:Not Present- Bloody Stool, Heartburn, Abdominal Pain, Vomiting, Nausea, Constipation, Diarrhea, Difficulty Swallowing, Jaundice and Loss of appetitie. Female Genitourinary:Not Present- Blood in Urine, Urinary frequency, Weak urinary stream, Discharge, Flank Pain, Incontinence, Painful Urination, Urgency, Urinary Retention and Urinating at Night. Musculoskeletal:Present- Joint Pain. Not Present- Muscle Weakness, Muscle Pain, Joint Swelling, Back Pain, Morning Stiffness and Spasms. Neurological:Not Present- Tremor, Dizziness, Blackout spells, Paralysis, Difficulty with balance and Weakness. Psychiatric:Not Present- Insomnia.    Vitals Pulse: 76 (Regular) Resp.: 16 (Unlabored) BP: 138/84 (Sitting, Left Arm, Standard)     Physical Exam The physical exam findings are as follows:   General Mental Status - Alert, cooperative and good historian. General Appearance- pleasant. Not in acute distress. Orientation- Oriented X3. Build & Nutrition- Well nourished and Well developed.   Head and Neck Head- normocephalic, atraumatic . Neck Global Assessment- supple. no bruit auscultated on the right and no bruit auscultated on the left.   Eye Vision- Wears corrective lenses. Pupil- Bilateral- PERR Motion- Bilateral- EOMI. wears glasses  Chest and Lung Exam Auscultation: Breath sounds:- clear at anterior chest wall and - clear at posterior chest wall. Adventitious sounds:- No Adventitious sounds.   Cardiovascular Auscultation:Rhythm- Regular rate and rhythm. Heart Sounds- S1 WNL and S2 WNL. Murmurs & Other Heart  Sounds:Auscultation of the heart reveals - No Murmurs.   Abdomen Palpation/Percussion:Tenderness- Abdomen is non-tender to palpation. Rigidity (guarding)- Abdomen is soft. Auscultation:Auscultation of the abdomen reveals - Bowel sounds normal.   Female Genitourinary Not done, not pertinent to present illness  Musculoskeletal  Well developed female, alert and oriented in no apparent distress. Her hips show normal range of motion, no discomfort. Her left knee with no deformity and no effusion with range 0-135. No tenderness or instability. Her right knee with valgus deformity. Range is about 5 to 120, tenderness lateral greater than medial. No instability noted. Pulse, sensation and motor are intact.  RADIOGRAPHS: AP of both knees, lateral of the right shows that she has bone on bone arthritis in the lateral and patellofemoral compartments of the right knee with about a 15 degree valgus deformity. Her left knee looks normal.   Assessment & Plan Primary osteoarthritis of one knee (715.16) Impression: Right Knee  Note: Plan is for a Right Total Knee Replacement by Dr. Wynelle Link.  Plan is to go to [facility].  PCP - Dr. Linna Darner  The patient will not receive TXA (tranexamic acid) due to: Lung Cancer  Please note, the patient has gotten nauseated with anesthesia in the past.  Signed electronically by Joelene Millin, III PA-C

## 2013-12-11 ENCOUNTER — Inpatient Hospital Stay (HOSPITAL_COMMUNITY)
Admission: RE | Admit: 2013-12-11 | Discharge: 2013-12-14 | DRG: 470 | Disposition: A | Discharge status: Skilled Nursing Facility | Payer: Medicare Other | Source: Ambulatory Visit | Attending: Orthopedic Surgery | Admitting: Orthopedic Surgery

## 2013-12-11 ENCOUNTER — Encounter (HOSPITAL_COMMUNITY)
Admission: RE | Disposition: A | Discharge status: Skilled Nursing Facility | Payer: Self-pay | Source: Ambulatory Visit | Attending: Orthopedic Surgery

## 2013-12-11 ENCOUNTER — Encounter (HOSPITAL_COMMUNITY): Payer: Self-pay | Admitting: *Deleted

## 2013-12-11 ENCOUNTER — Inpatient Hospital Stay (HOSPITAL_COMMUNITY): Payer: Medicare Other | Admitting: Anesthesiology

## 2013-12-11 ENCOUNTER — Encounter (HOSPITAL_COMMUNITY): Payer: Self-pay

## 2013-12-11 ENCOUNTER — Encounter (HOSPITAL_COMMUNITY): Payer: Medicare Other | Admitting: Anesthesiology

## 2013-12-11 DIAGNOSIS — Z96651 Presence of right artificial knee joint: Secondary | ICD-10-CM

## 2013-12-11 DIAGNOSIS — M949 Disorder of cartilage, unspecified: Secondary | ICD-10-CM

## 2013-12-11 DIAGNOSIS — R011 Cardiac murmur, unspecified: Secondary | ICD-10-CM | POA: Diagnosis present

## 2013-12-11 DIAGNOSIS — Z6827 Body mass index (BMI) 27.0-27.9, adult: Secondary | ICD-10-CM

## 2013-12-11 DIAGNOSIS — Z87891 Personal history of nicotine dependence: Secondary | ICD-10-CM

## 2013-12-11 DIAGNOSIS — M1710 Unilateral primary osteoarthritis, unspecified knee: Principal | ICD-10-CM | POA: Diagnosis present

## 2013-12-11 DIAGNOSIS — M21869 Other specified acquired deformities of unspecified lower leg: Secondary | ICD-10-CM | POA: Diagnosis present

## 2013-12-11 DIAGNOSIS — M179 Osteoarthritis of knee, unspecified: Secondary | ICD-10-CM

## 2013-12-11 DIAGNOSIS — E871 Hypo-osmolality and hyponatremia: Secondary | ICD-10-CM

## 2013-12-11 DIAGNOSIS — Z85118 Personal history of other malignant neoplasm of bronchus and lung: Secondary | ICD-10-CM

## 2013-12-11 DIAGNOSIS — M899 Disorder of bone, unspecified: Secondary | ICD-10-CM | POA: Diagnosis present

## 2013-12-11 DIAGNOSIS — D62 Acute posthemorrhagic anemia: Secondary | ICD-10-CM

## 2013-12-11 DIAGNOSIS — E876 Hypokalemia: Secondary | ICD-10-CM

## 2013-12-11 DIAGNOSIS — I059 Rheumatic mitral valve disease, unspecified: Secondary | ICD-10-CM | POA: Diagnosis present

## 2013-12-11 DIAGNOSIS — Z01812 Encounter for preprocedural laboratory examination: Secondary | ICD-10-CM

## 2013-12-11 HISTORY — PX: TOTAL KNEE ARTHROPLASTY: SHX125

## 2013-12-11 LAB — TYPE AND SCREEN
ABO/RH(D): O POS
Antibody Screen: NEGATIVE

## 2013-12-11 LAB — ABO/RH: ABO/RH(D): O POS

## 2013-12-11 SURGERY — ARTHROPLASTY, KNEE, TOTAL
Anesthesia: Spinal | Site: Knee | Laterality: Right

## 2013-12-11 MED ORDER — RIVAROXABAN 10 MG PO TABS
10.0000 mg | ORAL_TABLET | Freq: Every day | ORAL | Status: DC
  Administered 2013-12-12 – 2013-12-14 (×3): 10 mg via ORAL
  Filled 2013-12-11 (×4): qty 1

## 2013-12-11 MED ORDER — BUPIVACAINE HCL 0.25 % IJ SOLN
INTRAMUSCULAR | Status: DC | PRN
  Administered 2013-12-11: 20 mL

## 2013-12-11 MED ORDER — CEFAZOLIN SODIUM-DEXTROSE 2-3 GM-% IV SOLR
2.0000 g | Freq: Four times a day (QID) | INTRAVENOUS | Status: AC
  Administered 2013-12-11 – 2013-12-12 (×2): 2 g via INTRAVENOUS
  Filled 2013-12-11 (×2): qty 50

## 2013-12-11 MED ORDER — ACETAMINOPHEN 500 MG PO TABS
1000.0000 mg | ORAL_TABLET | Freq: Four times a day (QID) | ORAL | Status: AC
  Administered 2013-12-11 – 2013-12-12 (×4): 1000 mg via ORAL
  Filled 2013-12-11 (×5): qty 2

## 2013-12-11 MED ORDER — KETAMINE HCL 10 MG/ML IJ SOLN
INTRAMUSCULAR | Status: AC
  Filled 2013-12-11: qty 1

## 2013-12-11 MED ORDER — ACETAMINOPHEN 650 MG RE SUPP: 650.0000 mg | Freq: Four times a day (QID) | RECTAL | Status: DC | PRN

## 2013-12-11 MED ORDER — MIDAZOLAM HCL 2 MG/2ML IJ SOLN
INTRAMUSCULAR | Status: AC
  Filled 2013-12-11: qty 2

## 2013-12-11 MED ORDER — LACTATED RINGERS IV SOLN
INTRAVENOUS | Status: DC
  Administered 2013-12-11: 1000 mL via INTRAVENOUS

## 2013-12-11 MED ORDER — ACETAMINOPHEN 500 MG PO TABS
1000.0000 mg | ORAL_TABLET | Freq: Once | ORAL | Status: AC
  Administered 2013-12-11: 1000 mg via ORAL
  Filled 2013-12-11: qty 2

## 2013-12-11 MED ORDER — MIDAZOLAM HCL 5 MG/5ML IJ SOLN
INTRAMUSCULAR | Status: DC | PRN
  Administered 2013-12-11 (×4): 0.5 mg via INTRAVENOUS

## 2013-12-11 MED ORDER — LACTATED RINGERS IV SOLN: INTRAVENOUS | Status: DC

## 2013-12-11 MED ORDER — DOCUSATE SODIUM 100 MG PO CAPS
100.0000 mg | ORAL_CAPSULE | Freq: Two times a day (BID) | ORAL | Status: DC
  Administered 2013-12-11 – 2013-12-14 (×6): 100 mg via ORAL

## 2013-12-11 MED ORDER — PROMETHAZINE HCL 25 MG/ML IJ SOLN: 6.2500 mg | INTRAMUSCULAR | Status: DC | PRN

## 2013-12-11 MED ORDER — BUPIVACAINE HCL (PF) 0.25 % IJ SOLN
INTRAMUSCULAR | Status: AC
  Filled 2013-12-11: qty 30

## 2013-12-11 MED ORDER — PROPOFOL 10 MG/ML IV BOLUS
INTRAVENOUS | Status: AC
  Filled 2013-12-11: qty 20

## 2013-12-11 MED ORDER — DEXTROSE-NACL 5-0.45 % IV SOLN
INTRAVENOUS | Status: DC
  Administered 2013-12-11 – 2013-12-12 (×2): via INTRAVENOUS

## 2013-12-11 MED ORDER — POLYETHYLENE GLYCOL 3350 17 G PO PACK: 17.0000 g | PACK | Freq: Every day | ORAL | Status: DC | PRN

## 2013-12-11 MED ORDER — SODIUM CHLORIDE 0.9 % IV SOLN: INTRAVENOUS | Status: DC

## 2013-12-11 MED ORDER — DEXTROSE 5 % IV SOLN
500.0000 mg | Freq: Four times a day (QID) | INTRAVENOUS | Status: DC | PRN
  Administered 2013-12-12: 500 mg via INTRAVENOUS
  Filled 2013-12-11 (×2): qty 5

## 2013-12-11 MED ORDER — FENTANYL CITRATE 0.05 MG/ML IJ SOLN
INTRAMUSCULAR | Status: DC | PRN
  Administered 2013-12-11: 25 ug via INTRAVENOUS
  Administered 2013-12-11: 50 ug via INTRAVENOUS
  Administered 2013-12-11: 25 ug via INTRAVENOUS

## 2013-12-11 MED ORDER — CEFAZOLIN SODIUM-DEXTROSE 2-3 GM-% IV SOLR
INTRAVENOUS | Status: AC
  Filled 2013-12-11: qty 50

## 2013-12-11 MED ORDER — DEXAMETHASONE SODIUM PHOSPHATE 10 MG/ML IJ SOLN
INTRAMUSCULAR | Status: AC
  Filled 2013-12-11: qty 1

## 2013-12-11 MED ORDER — DEXAMETHASONE 4 MG PO TABS
10.0000 mg | ORAL_TABLET | Freq: Every day | ORAL | Status: AC
  Administered 2013-12-12: 10 mg via ORAL
  Filled 2013-12-11: qty 1

## 2013-12-11 MED ORDER — CHLORHEXIDINE GLUCONATE CLOTH 2 % EX PADS: 6.0000 | MEDICATED_PAD | Freq: Once | CUTANEOUS | Status: DC

## 2013-12-11 MED ORDER — DIPHENHYDRAMINE HCL 12.5 MG/5ML PO ELIX: 12.5000 mg | ORAL_SOLUTION | ORAL | Status: DC | PRN

## 2013-12-11 MED ORDER — DEXAMETHASONE SODIUM PHOSPHATE 10 MG/ML IJ SOLN
10.0000 mg | Freq: Every day | INTRAMUSCULAR | Status: AC
  Filled 2013-12-11: qty 1

## 2013-12-11 MED ORDER — KETAMINE HCL 10 MG/ML IJ SOLN
INTRAMUSCULAR | Status: DC | PRN
  Administered 2013-12-11: 5 mg via INTRAVENOUS
  Administered 2013-12-11: 10 mg via INTRAVENOUS

## 2013-12-11 MED ORDER — PHENOL 1.4 % MT LIQD
1.0000 | OROMUCOSAL | Status: DC | PRN
  Filled 2013-12-11: qty 177

## 2013-12-11 MED ORDER — ONDANSETRON HCL 4 MG PO TABS: 4.0000 mg | ORAL_TABLET | Freq: Four times a day (QID) | ORAL | Status: DC | PRN

## 2013-12-11 MED ORDER — MORPHINE SULFATE 2 MG/ML IJ SOLN
1.0000 mg | INTRAMUSCULAR | Status: DC | PRN
  Administered 2013-12-11: 2 mg via INTRAVENOUS
  Administered 2013-12-11: 1 mg via INTRAVENOUS
  Administered 2013-12-12: 2 mg via INTRAVENOUS
  Filled 2013-12-11 (×3): qty 1

## 2013-12-11 MED ORDER — ONDANSETRON HCL 4 MG/2ML IJ SOLN
INTRAMUSCULAR | Status: AC
  Filled 2013-12-11: qty 2

## 2013-12-11 MED ORDER — EPHEDRINE SULFATE 50 MG/ML IJ SOLN
INTRAMUSCULAR | Status: DC | PRN
  Administered 2013-12-11 (×2): 10 mg via INTRAVENOUS

## 2013-12-11 MED ORDER — MENTHOL 3 MG MT LOZG
1.0000 | LOZENGE | OROMUCOSAL | Status: DC | PRN
  Filled 2013-12-11: qty 9

## 2013-12-11 MED ORDER — METOCLOPRAMIDE HCL 10 MG PO TABS: 5.0000 mg | ORAL_TABLET | Freq: Three times a day (TID) | ORAL | Status: DC | PRN

## 2013-12-11 MED ORDER — FENTANYL CITRATE 0.05 MG/ML IJ SOLN
INTRAMUSCULAR | Status: AC
  Filled 2013-12-11: qty 2

## 2013-12-11 MED ORDER — BISACODYL 10 MG RE SUPP: 10.0000 mg | Freq: Every day | RECTAL | Status: DC | PRN

## 2013-12-11 MED ORDER — METHOCARBAMOL 500 MG PO TABS
500.0000 mg | ORAL_TABLET | Freq: Four times a day (QID) | ORAL | Status: DC | PRN
  Administered 2013-12-11 – 2013-12-14 (×7): 500 mg via ORAL
  Filled 2013-12-11 (×7): qty 1

## 2013-12-11 MED ORDER — PROPOFOL INFUSION 10 MG/ML OPTIME
INTRAVENOUS | Status: DC | PRN
Start: 2013-12-11 — End: 2013-12-11
  Administered 2013-12-11: 75 ug/kg/min via INTRAVENOUS

## 2013-12-11 MED ORDER — OXYCODONE HCL 5 MG PO TABS
5.0000 mg | ORAL_TABLET | ORAL | Status: DC | PRN
  Administered 2013-12-11: 5 mg via ORAL
  Administered 2013-12-11: 10 mg via ORAL
  Administered 2013-12-11: 5 mg via ORAL
  Administered 2013-12-12: 10 mg via ORAL
  Administered 2013-12-12: 5 mg via ORAL
  Administered 2013-12-12 – 2013-12-13 (×3): 10 mg via ORAL
  Filled 2013-12-11 (×5): qty 2
  Filled 2013-12-11 (×2): qty 1
  Filled 2013-12-11: qty 2

## 2013-12-11 MED ORDER — HYDROMORPHONE HCL PF 1 MG/ML IJ SOLN: 0.2500 mg | INTRAMUSCULAR | Status: DC | PRN

## 2013-12-11 MED ORDER — CEFAZOLIN SODIUM-DEXTROSE 2-3 GM-% IV SOLR
2.0000 g | Freq: Once | INTRAVENOUS | Status: AC
  Administered 2013-12-11: 2 g via INTRAVENOUS

## 2013-12-11 MED ORDER — TRAMADOL HCL 50 MG PO TABS
50.0000 mg | ORAL_TABLET | Freq: Four times a day (QID) | ORAL | Status: DC | PRN
Start: 2013-12-11 — End: 2013-12-14

## 2013-12-11 MED ORDER — METOCLOPRAMIDE HCL 5 MG/ML IJ SOLN
5.0000 mg | Freq: Three times a day (TID) | INTRAMUSCULAR | Status: DC | PRN
  Administered 2013-12-12: 10 mg via INTRAVENOUS
  Filled 2013-12-11: qty 2

## 2013-12-11 MED ORDER — PSYLLIUM 95 % PO PACK
1.0000 | PACK | Freq: Every day | ORAL | Status: DC
  Administered 2013-12-13 – 2013-12-14 (×2): 1 via ORAL
  Filled 2013-12-11 (×3): qty 1

## 2013-12-11 MED ORDER — BUPIVACAINE LIPOSOME 1.3 % IJ SUSP
20.0000 mL | Freq: Once | INTRAMUSCULAR | Status: AC
  Administered 2013-12-11: 20 mL
  Filled 2013-12-11: qty 20

## 2013-12-11 MED ORDER — FLEET ENEMA 7-19 GM/118ML RE ENEM: 1.0000 | ENEMA | Freq: Once | RECTAL | Status: AC | PRN

## 2013-12-11 MED ORDER — ACETAMINOPHEN 325 MG PO TABS
650.0000 mg | ORAL_TABLET | Freq: Four times a day (QID) | ORAL | Status: DC | PRN
  Administered 2013-12-13: 650 mg via ORAL
  Filled 2013-12-11: qty 2

## 2013-12-11 MED ORDER — DEXAMETHASONE SODIUM PHOSPHATE 10 MG/ML IJ SOLN
10.0000 mg | Freq: Once | INTRAMUSCULAR | Status: AC
  Administered 2013-12-11: 10 mg via INTRAVENOUS

## 2013-12-11 MED ORDER — KETOROLAC TROMETHAMINE 15 MG/ML IJ SOLN
7.5000 mg | Freq: Four times a day (QID) | INTRAMUSCULAR | Status: AC | PRN
  Filled 2013-12-11: qty 1

## 2013-12-11 MED ORDER — SODIUM CHLORIDE 0.9 % IJ SOLN
INTRAMUSCULAR | Status: DC | PRN
  Administered 2013-12-11: 30 mL

## 2013-12-11 MED ORDER — ONDANSETRON HCL 4 MG/2ML IJ SOLN
4.0000 mg | Freq: Four times a day (QID) | INTRAMUSCULAR | Status: DC | PRN
  Administered 2013-12-12: 4 mg via INTRAVENOUS
  Filled 2013-12-11: qty 2

## 2013-12-11 MED ORDER — LACTATED RINGERS IV SOLN
INTRAVENOUS | Status: DC | PRN
  Administered 2013-12-11 (×2): via INTRAVENOUS

## 2013-12-11 MED ORDER — ONDANSETRON HCL 4 MG/2ML IJ SOLN
INTRAMUSCULAR | Status: DC | PRN
  Administered 2013-12-11: 4 mg via INTRAVENOUS

## 2013-12-11 MED ORDER — SODIUM CHLORIDE 0.9 % IJ SOLN
INTRAMUSCULAR | Status: AC
  Filled 2013-12-11: qty 50

## 2013-12-11 SURGICAL SUPPLY — 55 items
BAG SPEC THK2 15X12 ZIP CLS (MISCELLANEOUS) ×1
BAG ZIPLOCK 12X15 (MISCELLANEOUS) ×2 IMPLANT
BANDAGE ELASTIC 6 VELCRO ST LF (GAUZE/BANDAGES/DRESSINGS) ×2 IMPLANT
BANDAGE ESMARK 6X9 LF (GAUZE/BANDAGES/DRESSINGS) ×1 IMPLANT
BLADE SAG 18X100X1.27 (BLADE) ×2 IMPLANT
BLADE SAW SGTL 11.0X1.19X90.0M (BLADE) ×2 IMPLANT
BNDG CMPR 9X6 STRL LF SNTH (GAUZE/BANDAGES/DRESSINGS) ×1
BNDG ESMARK 6X9 LF (GAUZE/BANDAGES/DRESSINGS) ×2
BOWL SMART MIX CTS (DISPOSABLE) ×2 IMPLANT
CAPT RP KNEE ×1 IMPLANT
CEMENT HV SMART SET (Cement) ×4 IMPLANT
CUFF TOURN SGL QUICK 34 (TOURNIQUET CUFF) ×2
CUFF TRNQT CYL 34X4X40X1 (TOURNIQUET CUFF) ×1 IMPLANT
DECANTER SPIKE VIAL GLASS SM (MISCELLANEOUS) ×2 IMPLANT
DRAPE EXTREMITY T 121X128X90 (DRAPE) ×2 IMPLANT
DRAPE POUCH INSTRU U-SHP 10X18 (DRAPES) ×2 IMPLANT
DRAPE U-SHAPE 47X51 STRL (DRAPES) ×2 IMPLANT
DRSG ADAPTIC 3X8 NADH LF (GAUZE/BANDAGES/DRESSINGS) ×2 IMPLANT
DURAPREP 26ML APPLICATOR (WOUND CARE) ×2 IMPLANT
ELECT REM PT RETURN 9FT ADLT (ELECTROSURGICAL) ×2
ELECTRODE REM PT RTRN 9FT ADLT (ELECTROSURGICAL) ×1 IMPLANT
EVACUATOR 1/8 PVC DRAIN (DRAIN) ×2 IMPLANT
FACESHIELD LNG OPTICON STERILE (SAFETY) ×10 IMPLANT
GLOVE BIO SURGEON STRL SZ7.5 (GLOVE) IMPLANT
GLOVE BIO SURGEON STRL SZ8 (GLOVE) ×2 IMPLANT
GLOVE BIOGEL PI IND STRL 8 (GLOVE) ×2 IMPLANT
GLOVE BIOGEL PI INDICATOR 8 (GLOVE) ×2
GLOVE SURG SS PI 6.5 STRL IVOR (GLOVE) IMPLANT
GOWN STRL REUS W/TWL LRG LVL3 (GOWN DISPOSABLE) ×2 IMPLANT
GOWN STRL REUS W/TWL XL LVL3 (GOWN DISPOSABLE) IMPLANT
HANDPIECE INTERPULSE COAX TIP (DISPOSABLE) ×2
IMMOBILIZER KNEE 20 (SOFTGOODS) ×2 IMPLANT
KIT BASIN OR (CUSTOM PROCEDURE TRAY) ×2 IMPLANT
MANIFOLD NEPTUNE II (INSTRUMENTS) ×2 IMPLANT
NDL SAFETY ECLIPSE 18X1.5 (NEEDLE) ×2 IMPLANT
NEEDLE HYPO 18GX1.5 SHARP (NEEDLE) ×4
NS IRRIG 1000ML POUR BTL (IV SOLUTION) ×2 IMPLANT
PACK TOTAL JOINT (CUSTOM PROCEDURE TRAY) ×2 IMPLANT
PAD ABD 8X10 STRL (GAUZE/BANDAGES/DRESSINGS) ×2 IMPLANT
PADDING CAST COTTON 6X4 STRL (CAST SUPPLIES) ×5 IMPLANT
POSITIONER SURGICAL ARM (MISCELLANEOUS) ×2 IMPLANT
SET HNDPC FAN SPRY TIP SCT (DISPOSABLE) ×1 IMPLANT
SPONGE GAUZE 4X4 12PLY (GAUZE/BANDAGES/DRESSINGS) ×2 IMPLANT
STRIP CLOSURE SKIN 1/2X4 (GAUZE/BANDAGES/DRESSINGS) ×4 IMPLANT
SUCTION FRAZIER 12FR DISP (SUCTIONS) ×2 IMPLANT
SUT MNCRL AB 4-0 PS2 18 (SUTURE) ×2 IMPLANT
SUT VIC AB 2-0 CT1 27 (SUTURE) ×6
SUT VIC AB 2-0 CT1 TAPERPNT 27 (SUTURE) ×3 IMPLANT
SUT VLOC 180 0 24IN GS25 (SUTURE) ×2 IMPLANT
SYR 20CC LL (SYRINGE) ×2 IMPLANT
SYR 50ML LL SCALE MARK (SYRINGE) ×2 IMPLANT
TOWEL OR 17X26 10 PK STRL BLUE (TOWEL DISPOSABLE) ×4 IMPLANT
TRAY FOLEY CATH 14FRSI W/METER (CATHETERS) ×2 IMPLANT
WATER STERILE IRR 1500ML POUR (IV SOLUTION) ×2 IMPLANT
WRAP KNEE MAXI GEL POST OP (GAUZE/BANDAGES/DRESSINGS) ×2 IMPLANT

## 2013-12-11 NOTE — Anesthesia Postprocedure Evaluation (Signed)
Anesthesia Post Note  Patient: Maria [name]  Procedure(s) Performed: Procedure(s) (LRB): RIGHT TOTAL KNEE ARTHROPLASTY (Right)  Anesthesia type: Spinal  Patient location: PACU  Post pain: Pain level controlled  Post assessment: Post-op Vital signs reviewed  Last Vitals:  Filed Vitals:   12/11/13 2030  BP: 100/63  Pulse: 93  Temp: 36.7 C  Resp: 20    Post vital signs: Reviewed  Level of consciousness: sedated  Complications: No apparent anesthesia complications

## 2013-12-11 NOTE — Transfer of Care (Signed)
Immediate Anesthesia Transfer of Care Note  Patient: Maria [name]  Procedure(s) Performed: Procedure(s): RIGHT TOTAL KNEE ARTHROPLASTY (Right)  Patient Location: PACU  Anesthesia Type:Spinal  Level of Consciousness: awake, alert  and patient cooperative  Airway & Oxygen Therapy: Patient Spontanous Breathing and Patient connected to face mask oxygen  Post-op Assessment: Report given to PACU RN and Post -op Vital signs reviewed and stable  Post vital signs: Reviewed and stable  Complications: No apparent anesthesia complications

## 2013-12-11 NOTE — H&P (View-Only) (Signed)
Maria [name]  DOB: 04-Apr-1936 Divorced / Language: Cleophus Molt / Race: White Female  Date of Admission:  12-11-2012  Chief Complaint:  Right Knee Pain  History of Present Illness The patient is a 78 year old female who comes in for a preoperative History and Physical. The patient is scheduled for a right total knee arthroplasty to be performed by Dr. Dione Plover. Aluisio, MD at [facility] on 12-11-2013. The patient is a 78 year old female who presents with knee complaints. The patient is seen for a second opinion. The patient reports right knee symptoms including: pain which began 15 year(s) (or more) ago without any known injury. Prior to being seen the patient was previously evaluated by a colleague. Previous work-up for this problem has included knee x-rays. Past treatment for this problem has included intra-articular injection of corticosteroids (at [facility]). Note for "Knee pain": She did have a patellar fracture, January a year ago. She recently moved back here from [place], [place]. She has had problems with the knee for many years. She has had a progressive deformity in the knee. She has also had progressive pain. The pain is limiting what she can and cannot do. She has had injections in the past which are no longer beneficial. She is at a stage where she wants a more permanent solution to her knee problem. She has had a progressive valgus deformity and the knee is starting to give out on her more. She is concerned about falling. She is ready to get the right knee fixed. They have been treated conservatively in the past for the above stated problem and despite conservative measures, they continue to have progressive pain and severe functional limitations and dysfunction. They have failed non-operative management including home exercise, medications, and injections. It is felt that they would benefit from undergoing total joint replacement. Risks and benefits of the procedure  have been discussed with the patient and they elect to proceed with surgery. There are no active contraindications to surgery such as ongoing infection or rapidly progressive neurological disease.    Allergies Sulfanilamide *CHEMICALS*. Sickness   Problem List/Past Medical History Primary osteoarthritis of one knee (715.16) Chronic Pain Heart murmur secondary to Rheumatic Fever as a child Lung Cancer   Family History Cerebrovascular Accident. Father. Chronic Obstructive Lung Disease. Mother. First Degree Relatives. reported Cancer. Father. Osteoarthritis. Father, Sister. Heart Disease. Father. Heart disease in female family member before age 3 Hypertension. Father.    Social History Living situation. live alone Marital status. divorced No history of drug/alcohol rehab Exercise. Exercises daily; does running / walking Children. 2 Current drinker. 09/21/2013: Currently drinks wine less than 5 times per week Current work status. retired Tobacco use. Former smoker. 09/21/2013: smoke(d) 1/2 pack(s) per day Not under pain contract Number of flights of stairs before winded. 1 Tobacco / smoke exposure. 09/21/2013: no Post-Surgical Plans. Plan is for [facility]    Medication History Fish Oil Burp-Less ( Oral) Specific dose unknown - Active. Calcium Carbonate ( Oral) Specific dose unknown - Active. (liquid/unsure of doseage) Vitamin D ( Oral) Specific dose unknown - Active. Multivitamin ( Oral) Active. Folic Acid ( Oral) Specific dose unknown - Active. Advil (200MG  Tablet, Oral) Active.   Past Surgical History Breast Biopsy. right Hysterectomy. partial (non-cancerous) Lung Surgery . right   Review of Systems General:Not Present- Chills, Fever, Night Sweats, Fatigue, Weight Gain, Weight Loss and Memory Loss. Skin:Not Present- Hives, Itching, Rash, Eczema and Lesions. HEENT:Not Present- Tinnitus, Headache, Double  Vision, Visual  Loss, Hearing Loss and Dentures. Respiratory:Not Present- Shortness of breath with exertion, Shortness of breath at rest, Allergies, Coughing up blood and Chronic Cough. Cardiovascular:Not Present- Chest Pain, Racing/skipping heartbeats, Difficulty Breathing Lying Down, Murmur, Swelling and Palpitations. Gastrointestinal:Not Present- Bloody Stool, Heartburn, Abdominal Pain, Vomiting, Nausea, Constipation, Diarrhea, Difficulty Swallowing, Jaundice and Loss of appetitie. Female Genitourinary:Not Present- Blood in Urine, Urinary frequency, Weak urinary stream, Discharge, Flank Pain, Incontinence, Painful Urination, Urgency, Urinary Retention and Urinating at Night. Musculoskeletal:Present- Joint Pain. Not Present- Muscle Weakness, Muscle Pain, Joint Swelling, Back Pain, Morning Stiffness and Spasms. Neurological:Not Present- Tremor, Dizziness, Blackout spells, Paralysis, Difficulty with balance and Weakness. Psychiatric:Not Present- Insomnia.    Vitals Pulse: 76 (Regular) Resp.: 16 (Unlabored) BP: 138/84 (Sitting, Left Arm, Standard)     Physical Exam The physical exam findings are as follows:   General Mental Status - Alert, cooperative and good historian. General Appearance- pleasant. Not in acute distress. Orientation- Oriented X3. Build & Nutrition- Well nourished and Well developed.   Head and Neck Head- normocephalic, atraumatic . Neck Global Assessment- supple. no bruit auscultated on the right and no bruit auscultated on the left.   Eye Vision- Wears corrective lenses. Pupil- Bilateral- PERR Motion- Bilateral- EOMI. wears glasses  Chest and Lung Exam Auscultation: Breath sounds:- clear at anterior chest wall and - clear at posterior chest wall. Adventitious sounds:- No Adventitious sounds.   Cardiovascular Auscultation:Rhythm- Regular rate and rhythm. Heart Sounds- S1 WNL and S2 WNL. Murmurs & Other Heart  Sounds:Auscultation of the heart reveals - No Murmurs.   Abdomen Palpation/Percussion:Tenderness- Abdomen is non-tender to palpation. Rigidity (guarding)- Abdomen is soft. Auscultation:Auscultation of the abdomen reveals - Bowel sounds normal.   Female Genitourinary Not done, not pertinent to present illness  Musculoskeletal  Well developed female, alert and oriented in no apparent distress. Her hips show normal range of motion, no discomfort. Her left knee with no deformity and no effusion with range 0-135. No tenderness or instability. Her right knee with valgus deformity. Range is about 5 to 120, tenderness lateral greater than medial. No instability noted. Pulse, sensation and motor are intact.  RADIOGRAPHS: AP of both knees, lateral of the right shows that she has bone on bone arthritis in the lateral and patellofemoral compartments of the right knee with about a 15 degree valgus deformity. Her left knee looks normal.   Assessment & Plan Primary osteoarthritis of one knee (715.16) Impression: Right Knee  Note: Plan is for a Right Total Knee Replacement by Dr. Wynelle Link.  Plan is to go to [facility].  PCP - Dr. Linna Darner  The patient will not receive TXA (tranexamic acid) due to: Lung Cancer  Please note, the patient has gotten nauseated with anesthesia in the past.  Signed electronically by Joelene Millin, III PA-C

## 2013-12-11 NOTE — Progress Notes (Signed)
Spoke with dr fortune by phone and made aware pt lung cancer history no chest xray or ekg needed for 12-12-13 surgery.

## 2013-12-11 NOTE — Interval H&P Note (Signed)
History and Physical Interval Note:  12/11/2013 1:11 PM  Maria [name]  has presented today for surgery, with the diagnosis of Osteoarthritis of the Right Knee  The various methods of treatment have been discussed with the patient and family. After consideration of risks, benefits and other options for treatment, the patient has consented to  Procedure(s): RIGHT TOTAL KNEE ARTHROPLASTY (Right) as a surgical intervention .  The patient's history has been reviewed, patient examined, no change in status, stable for surgery.  I have reviewed the patient's chart and labs.  Questions were answered to the patient's satisfaction.     Gearlean Alf

## 2013-12-11 NOTE — Op Note (Signed)
Pre-operative diagnosis- Osteoarthritis Right knee(s)  Post-operative diagnosis- Osteoarthritis  Right knee(s)  Procedure-   Right Total Knee Arthroplasty  Surgeon- Maria Plover. Ellisyn Icenhower, MD  Assistant- Arlee Muslim, PA-C   Anesthesia-  Spinal   EBL- * No blood loss amount entered *   Drains Hemovac   Tourniquet time  Total Tourniquet Time Documented: Thigh (Left) - 36 minutes Total: Thigh (Left) - 36 minutes    Complications- None  Condition-PACU - hemodynamically stable.   Brief Clinical Note  Maria [name] is a 78 y.o. year old female with end stage OA of her right knee with progressively worsening pain and dysfunction. She has constant pain, with activity and at rest and significant functional deficits with difficulties even with ADLs. She has had extensive non-op management including analgesics, injections of cortisone, and home exercise program, but remains in significant pain with significant dysfunction.Radiographs show bone on bone arthritis lateral and patellofemoral. She presents now for right Total Knee Arthroplasty.   Procedure in detail---       The patient is brought into the operating room and positioned supine on the operating table. After successful administration of Spinal anesthetic, a tourniquet is placed high on the Left thigh(s) and the lower extremity is prepped and draped in the usual sterile fashion. Time out is performed by the operating team and then the Left  lower extremity is wrapped in Esmarch, knee flexed and the tourniquet inflated to 300 mmHg.       A midline incision is made with a ten blade through the subcutaneous tissue to the level of the extensor mechanism. A fresh blade is used to make a lateral parapatellar arthrotomy due to the patients' valgus deformity. Soft tissue over the proximal lateral tibia is subperiosteally elevated to the joint line with a knife to the posterolateral corner but not including the structures of the posterolateral  corner. Soft tissue over the proximal medial tibia is elevated with attention being paid to avoiding the patellar tendon on the tibial tubercle. The patella is everted medially, knee flexed 90 degrees and the ACL and PCL are removed. Findings are bone on bone lateral and patellofemoral with large global osteopjytes .       The drill is used to create a starting hole in the distal femur and the canal is thoroughly irrigated with sterile saline to remove the fatty contents. The 5 degree Right  valgus alignment guide is placed into the femoral canal and the distal femoral cutting block is pinned to remove 10  mm off the distal femur. Resection is made with an oscillating saw.      The tibia is subluxed forward and the menisci are removed. The extramedullary alignment guide is placed referencing proximally at the medial aspect of the tibial tubercle and distally along the second metatarsal axis and tibial crest. The block is pinned to remove 36mm off the more deficient lateral side. Resection is made with an oscillating saw. Size 2.5  is the most appropriate size for the tibia and the proximal tibia is prepared with the modular drill and keel punch for that size.      The femoral sizing guide is placed and size 3  is most appropriate. Rotation is marked off the epicondylar axis and confirmed by creating a rectangular flexion gap at 90 degrees. The size 3  cutting block is pinned in this rotation and the anterior, posterior and chamfer cuts are made with the oscillating saw. The intercondylar block is then placed and  that cut is made.      Trial size 2.5  tibial component, trial size 3  posterior stabilized femur and a 12.5  mm posterior stabilized rotating platform insert trial is placed. Full extension is achieved with excellent varus/valgus and   anterior/posterior balance throughout full range of motion. The patella is everted and thickness measured to be 22  mm. Free hand resection is taken to 12 mm, a 35  template is placed, lug holes are drilled, trial patella is placed, and it tracks normally. Osteophytes are removed off the posterior femur with the trial in place. All trials are removed and the cut bone surfaces prepared with pulsatile lavage. Cement is mixed and once ready for implantation, the size 2.5  tibial implant, size 3 posterior stabilized femoral component, and the size 35  patella are cemented in place and the patella is held with the clamp. The trial insert is placed and the knee held in full extension. The Exparel (20 ml mixed with 30 ml saline) and then 20 ml of .25% Bupivicaine is injected into the extensor mechanism, posterior capsule, medial and lateral gutters and subcutaneous tissues. All extruded cement is removed and once the cement is hard the permanent 12.5  mm posterior stabilized rotating platform insert is placed into the tibial tray.      The wound is copiously irrigated with saline solution and the tourniquet is released for a total   tourniquet time of 36  minutes. Bleeding is identified and controlled with electrocautery. The extensor mechanism is closed with interrupted #1 PDS leaving open a small area from the superior to inferior pole of the patella to serve as a mini lateral release. Flexion against gravity is 140  degrees and the patella tracks normally. Subcutaneous tissue is closed with 2.0 vicryl and subcuticular with running 4.0 Monocryl.The incision is cleaned and dried and steri-strips and a bulky sterile dressing are applied. The limb is placed into a knee immobilizer and the patient is awakened and transported to recovery in stable condition.      Please note that a surgical assistant was a medical necessity for this procedure in order to perform it in a safe and expeditious manner. Surgical assistant was necessary to retract the ligaments and vital neurovascular structures to prevent injury to them and also necessary for proper positioning of the limb to allow for  anatomic placement of the prosthesis.    Maria Plover Tamikka Pilger, MD    12/11/2013, 3:27 PM

## 2013-12-11 NOTE — Anesthesia Preprocedure Evaluation (Signed)
Anesthesia Evaluation  Patient identified by MRN, date of birth, ID band Patient awake    Reviewed: Allergy & Precautions, H&P , NPO status , Patient's Chart, lab work & pertinent test results  History of Anesthesia Complications (+) PONV  Airway Mallampati: II TM Distance: >3 FB Neck ROM: Full    Dental  (+) Teeth Intact and Dental Advisory Given   Pulmonary neg pulmonary ROS, former smoker,  Hx of lung CA in remote past breath sounds clear to auscultation  Pulmonary exam normal       Cardiovascular + Valvular Problems/Murmurs MR Rhythm:Regular Rate:Normal     Neuro/Psych negative neurological ROS  negative psych ROS   GI/Hepatic negative GI ROS, Neg liver ROS,   Endo/Other  negative endocrine ROS  Renal/GU negative Renal ROS  negative genitourinary   Musculoskeletal negative musculoskeletal ROS (+)   Abdominal   Peds  Hematology negative hematology ROS (+)   Anesthesia Other Findings   Reproductive/Obstetrics                           Anesthesia Physical Anesthesia Plan  ASA: II  Anesthesia Plan: Spinal   Post-op Pain Management:    Induction: Intravenous  Airway Management Planned: Simple Face Mask  Additional Equipment:   Intra-op Plan:   Post-operative Plan:   Informed Consent: I have reviewed the patients History and Physical, chart, labs and discussed the procedure including the risks, benefits and alternatives for the proposed anesthesia with the patient or authorized representative who has indicated his/her understanding and acceptance.   Dental advisory given  Plan Discussed with: CRNA  Anesthesia Plan Comments:         Anesthesia Quick Evaluation

## 2013-12-11 NOTE — Plan of Care (Signed)
Problem: Consults Goal: Diagnosis- Total Joint Replacement Primary Total Knee     

## 2013-12-11 NOTE — Anesthesia Procedure Notes (Signed)
Spinal  Patient location during procedure: OR Start time: 12/11/2013 2:11 PM End time: 12/11/2013 2:15 PM Staffing Anesthesiologist: Freddie Apley F Performed by: anesthesiologist  Preanesthetic Checklist Completed: patient identified, site marked, surgical consent, pre-op evaluation, timeout performed, IV checked, risks and benefits discussed and monitors and equipment checked Spinal Block Patient position: sitting Prep: Betadine Patient monitoring: heart rate, continuous pulse ox and blood pressure Approach: midline Location: L3-4 Injection technique: single-shot Needle Needle type: Spinocan  Needle gauge: 22 G Needle length: 9 cm Additional Notes Expiration date of kit checked and confirmed. Patient tolerated procedure well, without complications. Negative heme/paresthesia

## 2013-12-11 NOTE — Progress Notes (Signed)
Update given to Dr. Winfred Leeds; pt able to moves upper thighs, sensation at L1; ok to go to room upstairs

## 2013-12-12 ENCOUNTER — Encounter (HOSPITAL_COMMUNITY): Payer: Self-pay | Admitting: Orthopedic Surgery

## 2013-12-12 DIAGNOSIS — E876 Hypokalemia: Secondary | ICD-10-CM | POA: Diagnosis not present

## 2013-12-12 DIAGNOSIS — E871 Hypo-osmolality and hyponatremia: Secondary | ICD-10-CM | POA: Diagnosis not present

## 2013-12-12 LAB — CBC
HCT: 35.6 % — ABNORMAL LOW (ref 36.0–46.0)
Hemoglobin: 12.3 g/dL (ref 12.0–15.0)
MCH: 31.1 pg (ref 26.0–34.0)
MCHC: 34.6 g/dL (ref 30.0–36.0)
MCV: 89.9 fL (ref 78.0–100.0)
Platelets: 240 10*3/uL (ref 150–400)
RBC: 3.96 MIL/uL (ref 3.87–5.11)
RDW: 12.7 % (ref 11.5–15.5)
WBC: 11.9 10*3/uL — ABNORMAL HIGH (ref 4.0–10.5)

## 2013-12-12 LAB — BASIC METABOLIC PANEL
BUN: 8 mg/dL (ref 6–23)
CO2: 24 mEq/L (ref 19–32)
Calcium: 8.2 mg/dL — ABNORMAL LOW (ref 8.4–10.5)
Chloride: 97 mEq/L (ref 96–112)
Creatinine, Ser: 0.53 mg/dL (ref 0.50–1.10)
GFR calc Af Amer: >90 mL/min (ref >90)
GFR calc non Af Amer: 90 mL/min — ABNORMAL LOW (ref >90)
Glucose, Bld: 191 mg/dL — ABNORMAL HIGH (ref 70–99)
Potassium: 3.4 mEq/L — ABNORMAL LOW (ref 3.7–5.3)
Sodium: 135 mEq/L — ABNORMAL LOW (ref 137–147)

## 2013-12-12 MED ORDER — HYDROCODONE-ACETAMINOPHEN 5-325 MG PO TABS
1.0000 | ORAL_TABLET | ORAL | Status: DC | PRN
  Administered 2013-12-12 – 2013-12-14 (×7): 1 via ORAL
  Administered 2013-12-14: 2 via ORAL
  Administered 2013-12-14: 1 via ORAL
  Filled 2013-12-12 (×3): qty 1
  Filled 2013-12-12: qty 2
  Filled 2013-12-12 (×5): qty 1

## 2013-12-12 NOTE — Progress Notes (Signed)
Clinical Social Work Department CLINICAL SOCIAL WORK PLACEMENT NOTE 12/12/2013  Patient:  MELANY, WIESMAN  Account Number:  [id number] Admit date:  12/11/2013  Clinical Social Worker:  Werner Lean, LCSW  Date/time:  12/12/2013 01:18 PM  Clinical Social Work is seeking post-discharge placement for this patient at the following level of care:   SKILLED NURSING   (*CSW will update this form in Epic as items are completed)     Patient/family provided with [facility] Department of Clinical Social Work's list of facilities offering this level of care within the geographic area requested by the patient (or if unable, by the patient's family).  12/12/2013  Patient/family informed of their freedom to choose among providers that offer the needed level of care, that participate in Medicare, Medicaid or managed care program needed by the patient, have an available bed and are willing to accept the patient.    Patient/family informed of [facility]' ownership interest in [facility], as well as of the fact that they are under no obligation to receive care at this facility.  PASARR submitted to EDS on 12/12/2013 PASARR number received from EDS on 12/12/2013  FL2 transmitted to all facilities in geographic area requested by pt/family on  12/12/2013 FL2 transmitted to all facilities within larger geographic area on   Patient informed that his/her managed care company has contracts with or will negotiate with  certain facilities, including the following:     Patient/family informed of bed offers received:  12/12/2013 Patient chooses bed at [facility] Physician recommends and patient chooses bed at    Patient to be transferred to [place] on   Patient to be transferred to facility by   The following physician request were entered in Epic:   Additional Comments:  Werner Lean LCSW [phone]

## 2013-12-12 NOTE — Progress Notes (Signed)
   Subjective: 1 Day Post-Op Procedure(s) (LRB): RIGHT TOTAL KNEE ARTHROPLASTY (Right) Patient reports pain as mild.   Patient seen in rounds with Dr. Wynelle Link. Friend in the room at bedside. Patient is well, but has had some minor complaints of pain in the knee, requiring pain medications We will start therapy today.  Plan is to go [facility] after hospital stay.  Objective: Vital signs in last 24 hours: Temp:  [97.1 F (36.2 C)-98.6 F (37 C)] 97.5 F (36.4 C) (02/10 0530) Pulse Rate:  [78-101] 78 (02/10 0530) Resp:  [12-20] 16 (02/10 0530) BP: (100-142)/(63-92) 116/72 mmHg (02/10 0530) SpO2:  [92 %-100 %] 96 % (02/10 0530) Weight:  [75.569 kg (166 lb 9.6 oz)] 75.569 kg (166 lb 9.6 oz) (02/09 1715)  Intake/Output from previous day:  Intake/Output Summary (Last 24 hours) at 12/12/13 8250 Last data filed at 12/12/13 0620  Gross per 24 hour  Intake 3117.5 ml  Output   2865 ml  Net  252.5 ml    Intake/Output this shift:    Labs:  Recent Labs  12/12/13 0438  HGB 12.3    Recent Labs  12/12/13 0438  WBC 11.9*  RBC 3.96  HCT 35.6*  PLT 240    Recent Labs  12/12/13 0438  NA 135*  K 3.4*  CL 97  CO2 24  BUN 8  CREATININE 0.53  GLUCOSE 191*  CALCIUM 8.2*   No results found for this basename: LABPT, INR,  in the last 72 hours  EXAM General - Patient is Alert, Appropriate and Oriented Extremity - Neurovascular intact Sensation intact distally Dressing - dressing C/D/I Motor Function - intact, moving foot and toes well on exam.  Hemovac pulled without difficulty.  Past Medical History  Diagnosis Date  . Diverticulosis   . Goiter, nodular     [place] of thyroid stable 7/08  . Osteopenia     Dr Pamala Hurry  . Vitamin D deficiency   . Mitral valve prolapse   . Heart murmur     due to rheumatic fever as child  . Complication of anesthesia     slow to awaken  . PONV (postoperative nausea and vomiting)     n/v  . Neoplasm of lung, malignant 2002   Non-small cell    Assessment/Plan: 1 Day Post-Op Procedure(s) (LRB): RIGHT TOTAL KNEE ARTHROPLASTY (Right) Principal Problem:   OA (osteoarthritis) of knee Active Problems:   Hyponatremia   Hypokalemia  Estimated body mass index is 27.29 kg/(m^2) as calculated from the following:   Height as of this encounter: 5' 5.5" (1.664 m).   Weight as of this encounter: 75.569 kg (166 lb 9.6 oz). Advance diet Up with therapy Discharge to SNF  DVT Prophylaxis - Xarelto Weight-Bearing as tolerated to right leg D/C O2 and Pulse OX and try on Room Air  PERKINS, ALEXZANDREW 12/12/2013, 8:22 AM

## 2013-12-12 NOTE — Progress Notes (Signed)
Utilization review completed.  

## 2013-12-12 NOTE — Progress Notes (Signed)
Clinical Social Work Department BRIEF PSYCHOSOCIAL ASSESSMENT 12/12/2013  Patient:  LYNDELL, GILLYARD     Account Number:  [id number]     Admit date:  12/11/2013  Clinical Social Worker:  Lacie Scotts  Date/Time:  12/12/2013 01:10 PM  Referred by:  Physician  Date Referred:  12/12/2013 Referred for  SNF Placement   Other Referral:   Interview type:  Patient Other interview type:    PSYCHOSOCIAL DATA Living Status:  ALONE Admitted from facility:   Level of care:   Primary support name:  Jefferey Pica Primary support relationship to patient:  CHILD, ADULT Degree of support available:   unclear    CURRENT CONCERNS Current Concerns  Post-Acute Placement   Other Concerns:    SOCIAL WORK ASSESSMENT / PLAN Pt is 78 yr old female living at home prior to hospitalization. CSW met with pt to assist with d/c planning. Pt has made prior arrangements to have ST Rehab at [facility] following hospital d/c. CSW has contacted the SNF and d/c plans have been confirmed. CSW will continue to follow to assist with d/c planning.   Assessment/plan status:  Psychosocial Support/Ongoing Assessment of Needs Other assessment/ plan:   Information/referral to community resources:   Insurance coverage for SNF and ambulance transport reviewed.    PATIENT'S/FAMILY'S RESPONSE TO PLAN OF CARE: " I 've been to [facility] and my admission papers have been signed. "  Pt is motivated to begin PT. She is looking forward to having rehab at [facility].   Werner Lean LCSW [phone]

## 2013-12-12 NOTE — Discharge Instructions (Addendum)
Dr. Gaynelle Arabian Total Joint Specialist [facility] [address] [place], [place] [address] [phone]  TOTAL KNEE REPLACEMENT POSTOPERATIVE DIRECTIONS    Knee Rehabilitation, Guidelines Following Surgery  Results after knee surgery are often greatly improved when you follow the exercise, range of motion and muscle strengthening exercises prescribed by your doctor. Safety measures are also important to protect the knee from further injury. Any time any of these exercises cause you to have increased pain or swelling in your knee joint, decrease the amount until you are comfortable again and slowly increase them. If you have problems or questions, call your caregiver or physical therapist for advice.   HOME CARE INSTRUCTIONS  Remove items at home which could result in a fall. This includes throw rugs or furniture in walking pathways.  Continue medications as instructed at time of discharge. You may have some home medications which will be placed on hold until you complete the course of blood thinner medication.  You may start showering once you are discharged home but do not submerge the incision under water. Just pat the incision dry and apply a dry gauze dressing on daily. Walk with walker as instructed.  You may resume a sexual relationship in one month or when given the OK by  your doctor.   Use walker as long as suggested by your caregivers.  Avoid periods of inactivity such as sitting longer than an hour when not asleep. This helps prevent blood clots.  You may put full weight on your legs and walk as much as is comfortable.  You may return to work once you are cleared by your doctor.  Do not drive a car for 6 weeks or until released by you surgeon.   Do not drive while taking narcotics.  Wear the elastic stockings for three weeks following surgery during the day but you may remove then at night. Make sure you keep all of your appointments after your  operation with all of your doctors and caregivers. You should call the office at the above phone number and make an appointment for approximately two weeks after the date of your surgery. Change the dressing daily and reapply a dry dressing each time. Please pick up a stool softener and laxative for home use as long as you are requiring pain medications.  Continue to use ice on the knee for pain and swelling from surgery. You may notice swelling that will progress down to the foot and ankle.  This is normal after surgery.  Elevate the leg when you are not up walking on it.   It is important for you to complete the blood thinner medication as prescribed by your doctor.  Continue to use the breathing machine which will help keep your temperature down.  It is common for your temperature to cycle up and down following surgery, especially at night when you are not up moving around and exerting yourself.  The breathing machine keeps your lungs expanded and your temperature down.  RANGE OF MOTION AND STRENGTHENING EXERCISES  Rehabilitation of the knee is important following a knee injury or an operation. After just a few days of immobilization, the muscles of the thigh which control the knee become weakened and shrink (atrophy). Knee exercises are designed to build up the tone and strength of the thigh muscles and to improve knee motion. Often times heat used for twenty to thirty minutes before working out will loosen up your tissues and help with improving the  range of motion but do not use heat for the first two weeks following surgery. These exercises can be done on a training (exercise) mat, on the floor, on a table or on a bed. Use what ever works the best and is most comfortable for you Knee exercises include:  Leg Lifts - While your knee is still immobilized in a splint or cast, you can do straight leg raises. Lift the leg to 60 degrees, hold for 3 sec, and slowly lower the leg. Repeat 10-20 times 2-3  times daily. Perform this exercise against resistance later as your knee gets better.  Quad and Hamstring Sets - Tighten up the muscle on the front of the thigh (Quad) and hold for 5-10 sec. Repeat this 10-20 times hourly. Hamstring sets are done by pushing the foot backward against an object and holding for 5-10 sec. Repeat as with quad sets.  A rehabilitation program following serious knee injuries can speed recovery and prevent re-injury in the future due to weakened muscles. Contact your doctor or a physical therapist for more information on knee rehabilitation.   SKILLED REHAB INSTRUCTIONS: If the patient is transferred to a skilled rehab facility following release from the hospital, a list of the current medications will be sent to the facility for the patient to continue.  When discharged from the skilled rehab facility, please have the facility set up the patient's [place] prior to being released. Also, the skilled facility will be responsible for providing the patient with their medications at time of release from the facility to include their pain medication, the muscle relaxants, and their blood thinner medication. If the patient is still at the rehab facility at time of the two week follow up appointment, the skilled rehab facility will also need to assist the patient in arranging follow up appointment in our office and any transportation needs.  MAKE SURE YOU:  Understand these instructions.  Will watch your condition.  Will get help right away if you are not doing well or get worse.    Pick up stool softner and laxative for home. Do not submerge incision under water. May shower. Continue to use ice for pain and swelling from surgery.  Take Xarelto for two and a half more weeks, then discontinue Xarelto. Once the patient has completed the blood thinner regimen, then take a Baby 81 mg Aspirin daily for four more weeks.  When discharged from the skilled rehab  facility, please have the facility set up the patient's [place] prior to being released.  Also provide the patient with their medications at time of release from the facility to include their pain medication, the muscle relaxants, and their blood thinner medication.  If the patient is still at the rehab facility at time of follow up appointment, please also assist the patient in arranging follow up appointment in our office and any transportation needs.     Information on my medicine - XARELTO (Rivaroxaban)  This medication education was reviewed with me or my healthcare representative as part of my discharge preparation.  The pharmacist that spoke with me during my hospital stay was:  Rudean Haskell, [facility]  Why was Xarelto prescribed for you? Xarelto was prescribed for you to reduce the risk of blood clots forming after orthopedic surgery. The medical term for these abnormal blood clots is venous thromboembolism (VTE).  What do you need to know about xarelto ? Take your Xarelto ONCE DAILY at the same time  every day. You may take it either with or without food.  If you have difficulty swallowing the tablet whole, you may crush it and mix in applesauce just prior to taking your dose.  Take Xarelto exactly as prescribed by your doctor and DO NOT stop taking Xarelto without talking to the doctor who prescribed the medication.  Stopping without other VTE prevention medication to take the place of Xarelto may increase your risk of developing a clot.  After discharge, you should have regular check-up appointments with your healthcare provider that is prescribing your Xarelto.    What do you do if you miss a dose? If you miss a dose, take it as soon as you remember on the same day then continue your regularly scheduled once daily regimen the next day. Do not take two doses of Xarelto on the same day.   Important Safety Information A possible side effect of Xarelto  is bleeding. You should call your healthcare provider right away if you experience any of the following:   Bleeding from an injury or your nose that does not stop.   Unusual colored urine (red or dark brown) or unusual colored stools (red or black).   Unusual bruising for unknown reasons.   A serious fall or if you hit your head (even if there is no bleeding).  Some medicines may interact with Xarelto and might increase your risk of bleeding while on Xarelto. To help avoid this, consult your healthcare provider or pharmacist prior to using any new prescription or non-prescription medications, including herbals, vitamins, non-steroidal anti-inflammatory drugs (NSAIDs) and supplements.  This website has more information on Xarelto: [email or web address]-benson.[email or web address]

## 2013-12-12 NOTE — Progress Notes (Signed)
KVO IVF's per previous MD order. Patient tolerating diet and liquids without difficulty.

## 2013-12-12 NOTE — Progress Notes (Signed)
OT Cancellation Note  Patient Details Name: Maria [name] MRN: [id number] DOB: 04/03/1936   Cancelled Treatment:    Reason Eval/Treat Not Completed: Other (comment)  Noted pt plans snf and OT eval likely not needed for admission.  Will defer OT eval that that venue.  Lemon Sternberg 12/12/2013, 9:50 AM Lesle Chris, OTR/L [phone] 12/12/2013

## 2013-12-12 NOTE — Evaluation (Signed)
Physical Therapy Evaluation Patient Details Name: Maria [name] MRN: [id number] DOB: May 18, 1936 Today's Date: 12/12/2013 Time: 8413-2440 PT Time Calculation (min): 32 min  PT Assessment / Plan / Recommendation History of Present Illness  s/p R TKR  Clinical Impression  Pt is s/p R TKA resulting in the deficits listed below (see PT Problem List).  Pt will benefit from skilled PT to increase their independence and safety with mobility to allow discharge to the venue listed below.  Pt plans to d/c to [facility].     PT Assessment  Patient needs continued PT services    Follow Up Recommendations  SNF    Does the patient have the potential to tolerate intense rehabilitation      Barriers to Discharge        Equipment Recommendations  None recommended by PT    Recommendations for Other Services     Frequency 7X/week    Precautions / Restrictions Precautions Precautions: Knee Required Braces or Orthoses: Knee Immobilizer - Right Knee Immobilizer - Right: Discontinue once straight leg raise with < 10 degree lag Restrictions RLE Weight Bearing: Weight bearing as tolerated   Pertinent Vitals/Pain Premedicated, activities to tolerance, ice packs applied end of session      Mobility  Bed Mobility Overal bed mobility: Needs Assistance Bed Mobility: Supine to Sit Supine to sit: Supervision General bed mobility comments: verbal cues for self assist technique Transfers Overall transfer level: Needs assistance Equipment used: Rolling walker (2 wheeled) Transfers: Sit to/from Stand Sit to Stand: Min guard General transfer comment: verbal cues for safe technique Ambulation/Gait Ambulation/Gait assistance: Min assist Ambulation Distance (Feet): 40 Feet Assistive device: Rolling walker (2 wheeled) Gait Pattern/deviations: Step-through pattern;Antalgic Gait velocity: decr General Gait Details: mainly min/guard assist however had LOB in bathroom requiring min assist to  steady (pt assisted to bathroom and nauseated so given cold clothes and able to ambulate farther in hallway since feeling better)    Exercises Total Joint Exercises Ankle Circles/Pumps: AROM;Both;10 reps Quad Sets: AROM;Both;10 reps Short Arc QuadSinclair Ship;Right;10 reps Heel Slides: AAROM;Right;15 reps Hip ABduction/ADduction: AROM;Right;10 reps Straight Leg Raises: 10 reps;AAROM;Right   PT Diagnosis: Difficulty walking;Acute pain  PT Problem List: Decreased strength;Decreased range of motion;Decreased mobility;Decreased knowledge of precautions;Decreased knowledge of use of DME;Pain PT Treatment Interventions: Functional mobility training;Gait training;DME instruction;Patient/family education;Therapeutic activities;Therapeutic exercise     PT Goals(Current goals can be found in the care plan section) Acute Rehab PT Goals PT Goal Formulation: With patient Time For Goal Achievement: 12/16/13 Potential to Achieve Goals: Good  Visit Information  Last PT Received On: 12/12/13 Assistance Needed: +1 History of Present Illness: s/p R TKR       Prior Functioning  Home Living Family/patient expects to be discharged to:: Skilled nursing facility Living Arrangements: Alone Prior Function Level of Independence: Independent Communication Communication: No difficulties    Cognition  Cognition Arousal/Alertness: Awake/alert Behavior During Therapy: WFL for tasks assessed/performed Overall Cognitive Status: Within Functional Limits for tasks assessed    Extremity/Trunk Assessment Lower Extremity Assessment Lower Extremity Assessment: RLE deficits/detail RLE Deficits / Details: fair quad contraction, unable to perform SLR, approx 40* knee flexion AAROM   Balance    End of Session PT - End of Session Equipment Utilized During Treatment: Right knee immobilizer Activity Tolerance: Patient tolerated treatment well Patient left: in chair;with call bell/phone within reach  GP      Devery Odwyer,KATHrine E 12/12/2013, 12:33 PM Carmelia Bake, PT, DPT 12/12/2013 Pager: [phone]

## 2013-12-13 LAB — BASIC METABOLIC PANEL
BUN: 10 mg/dL (ref 6–23)
CO2: 27 mEq/L (ref 19–32)
Calcium: 8.8 mg/dL (ref 8.4–10.5)
Chloride: 99 mEq/L (ref 96–112)
Creatinine, Ser: 0.53 mg/dL (ref 0.50–1.10)
GFR calc Af Amer: >90 mL/min (ref >90)
GFR calc non Af Amer: 90 mL/min — ABNORMAL LOW (ref >90)
Glucose, Bld: 159 mg/dL — ABNORMAL HIGH (ref 70–99)
Potassium: 3.6 mEq/L — ABNORMAL LOW (ref 3.7–5.3)
Sodium: 137 mEq/L (ref 137–147)

## 2013-12-13 LAB — CBC
HCT: 33.5 % — ABNORMAL LOW (ref 36.0–46.0)
Hemoglobin: 11.4 g/dL — ABNORMAL LOW (ref 12.0–15.0)
MCH: 30.7 pg (ref 26.0–34.0)
MCHC: 34 g/dL (ref 30.0–36.0)
MCV: 90.3 fL (ref 78.0–100.0)
Platelets: 240 10*3/uL (ref 150–400)
RBC: 3.71 MIL/uL — ABNORMAL LOW (ref 3.87–5.11)
RDW: 12.8 % (ref 11.5–15.5)
WBC: 12.9 10*3/uL — ABNORMAL HIGH (ref 4.0–10.5)

## 2013-12-13 NOTE — Progress Notes (Signed)
Pt awakened  and attempting OOB  without  assist. Writer had activated Bed Alarm earlier in pm when sister left room as sister had made nsg staff aware that pt was having periods of disorientation. Pt states "not sure what is going on "I know the medicine is doing this to me". Reassured pt over 15 minutes and will continue to observe closely. Oriented to place, time.

## 2013-12-13 NOTE — Progress Notes (Signed)
   Subjective: 2 Days Post-Op Procedure(s) (LRB): RIGHT TOTAL KNEE ARTHROPLASTY (Right) Patient reports pain as mild.   Patient seen in rounds for Dr. Wynelle Link.  She had a rough night and had some confusion with the meds.  She is doing better and clear this morning. Patient is well, but has had some minor complaints of pain in the knee, requiring pain medications Plan is to go [facility] after hospital stay.  Objective: Vital signs in last 24 hours: Temp:  [97.4 F (36.3 C)-98.7 F (37.1 C)] 98.2 F (36.8 C) (02/11 1330) Pulse Rate:  [84-101] 101 (02/11 1330) Resp:  [16-20] 16 (02/11 1330) BP: (135-159)/(79-85) 159/83 mmHg (02/11 1330) SpO2:  [92 %-94 %] 94 % (02/11 1330)  Intake/Output from previous day:  Intake/Output Summary (Last 24 hours) at 12/13/13 1733 Last data filed at 12/13/13 1300  Gross per 24 hour  Intake    685 ml  Output   3550 ml  Net  -2865 ml    Intake/Output this shift: Total I/O In: 480 [P.O.:480] Out: -   Labs:  Recent Labs  12/12/13 0438 12/13/13 0439  HGB 12.3 11.4*    Recent Labs  12/12/13 0438 12/13/13 0439  WBC 11.9* 12.9*  RBC 3.96 3.71*  HCT 35.6* 33.5*  PLT 240 240    Recent Labs  12/12/13 0438 12/13/13 0439  NA 135* 137  K 3.4* 3.6*  CL 97 99  CO2 24 27  BUN 8 10  CREATININE 0.53 0.53  GLUCOSE 191* 159*  CALCIUM 8.2* 8.8   No results found for this basename: LABPT, INR,  in the last 72 hours  EXAM General - Patient is Alert and Appropriate Extremity - Neurovascular intact Sensation intact distally Dressing/Incision - clean, dry, no drainage Motor Function - intact, moving foot and toes well on exam.   Past Medical History  Diagnosis Date  . Diverticulosis   . Goiter, nodular     [place] of thyroid stable 7/08  . Osteopenia     Dr Pamala Hurry  . Vitamin D deficiency   . Mitral valve prolapse   . Heart murmur     due to rheumatic fever as child  . Complication of anesthesia     slow to awaken  . PONV  (postoperative nausea and vomiting)     n/v  . Neoplasm of lung, malignant 2002    Non-small cell    Assessment/Plan: 2 Days Post-Op Procedure(s) (LRB): RIGHT TOTAL KNEE ARTHROPLASTY (Right) Principal Problem:   OA (osteoarthritis) of knee Active Problems:   Hyponatremia   Hypokalemia  Estimated body mass index is 27.29 kg/(m^2) as calculated from the following:   Height as of this encounter: 5' 5.5" (1.664 m).   Weight as of this encounter: 75.569 kg (166 lb 9.6 oz). Up with therapy Plan for discharge tomorrow Discharge to SNF  DVT Prophylaxis - Xarelto Weight-Bearing as tolerated to right leg  Maria [name] 12/13/2013, 5:33 PM

## 2013-12-13 NOTE — Progress Notes (Signed)
Physical Therapy Treatment Patient Details Name: Maria [name] MRN: [id number] DOB: 1936/03/31 Today's Date: 12/13/2013 Time: 4944-9675 PT Time Calculation (min): 27 min  PT Assessment / Plan / Recommendation  History of Present Illness s/p R TKR   PT Comments   Pt mostly requiring cues for safety today, states last night she was confused and "doped up" on pain meds.  Pt ambulated in hallway and performed LE exercises.  Pt plans to d/c to SNF tomorrow.   Follow Up Recommendations  SNF     Does the patient have the potential to tolerate intense rehabilitation     Barriers to Discharge        Equipment Recommendations  None recommended by PT    Recommendations for Other Services    Frequency 7X/week   Progress towards PT Goals Progress towards PT goals: Progressing toward goals  Plan Current plan remains appropriate    Precautions / Restrictions Precautions Precautions: Knee Required Braces or Orthoses: Knee Immobilizer - Right Knee Immobilizer - Right: Discontinue once straight leg raise with < 10 degree lag Restrictions RLE Weight Bearing: Weight bearing as tolerated   Pertinent Vitals/Pain Activities to tolerance, repositioned, pt reports premedicated    Mobility  Bed Mobility Overal bed mobility: Needs Assistance Bed Mobility: Supine to Sit Supine to sit: Supervision General bed mobility comments: verbal cues for self assist technique and for applying KI prior to getting OOB Transfers Overall transfer level: Needs assistance Equipment used: Rolling walker (2 wheeled) Transfers: Sit to/from Stand Sit to Stand: Min guard General transfer comment: verbal cues for safe technique Ambulation/Gait Ambulation/Gait assistance: Min guard Ambulation Distance (Feet): 120 Feet Assistive device: Rolling walker (2 wheeled) Gait Pattern/deviations: Step-through pattern;Antalgic Gait velocity: decr General Gait Details: verbal cues for sequence, RW distance and keeping  RW on the floor (not lifting esp when turning)    Exercises Total Joint Exercises Ankle Circles/Pumps: AROM;Both;10 reps Quad Sets: AROM;Both;10 reps Short Arc QuadSinclair Ship;Right;10 reps Heel Slides: AAROM;Right;15 reps Hip ABduction/ADduction: AROM;Right;10 reps Straight Leg Raises: 10 reps;AAROM;Right   PT Diagnosis:    PT Problem List:   PT Treatment Interventions:     PT Goals (current goals can now be found in the care plan section)    Visit Information  Last PT Received On: 12/13/13 Assistance Needed: +1 History of Present Illness: s/p R TKR    Subjective Data      Cognition  Cognition Arousal/Alertness: Awake/alert Behavior During Therapy: WFL for tasks assessed/performed Overall Cognitive Status: Within Functional Limits for tasks assessed    Balance     End of Session PT - End of Session Equipment Utilized During Treatment: Right knee immobilizer Activity Tolerance: Patient tolerated treatment well Patient left: in chair;with call bell/phone within reach;with family/visitor present   GP     Raymie Trani,KATHrine E 12/13/2013, 12:21 PM Carmelia Bake, PT, DPT 12/13/2013 Pager: [phone]

## 2013-12-14 ENCOUNTER — Other Ambulatory Visit: Payer: Self-pay | Admitting: *Deleted

## 2013-12-14 DIAGNOSIS — D62 Acute posthemorrhagic anemia: Secondary | ICD-10-CM | POA: Diagnosis not present

## 2013-12-14 LAB — CBC
HCT: 29.6 % — ABNORMAL LOW (ref 36.0–46.0)
Hemoglobin: 10 g/dL — ABNORMAL LOW (ref 12.0–15.0)
MCH: 30.7 pg (ref 26.0–34.0)
MCHC: 33.8 g/dL (ref 30.0–36.0)
MCV: 90.8 fL (ref 78.0–100.0)
Platelets: 206 10*3/uL (ref 150–400)
RBC: 3.26 MIL/uL — ABNORMAL LOW (ref 3.87–5.11)
RDW: 13.1 % (ref 11.5–15.5)
WBC: 10.6 10*3/uL — ABNORMAL HIGH (ref 4.0–10.5)

## 2013-12-14 MED ORDER — DSS 100 MG PO CAPS: 100.0000 mg | ORAL_CAPSULE | Freq: Two times a day (BID) | ORAL | Status: DC

## 2013-12-14 MED ORDER — BISACODYL 10 MG RE SUPP: 10.0000 mg | Freq: Every day | RECTAL | Status: DC | PRN

## 2013-12-14 MED ORDER — TRAMADOL HCL 50 MG PO TABS: 50.0000 mg | ORAL_TABLET | Freq: Four times a day (QID) | ORAL | Status: DC | PRN

## 2013-12-14 MED ORDER — ACETAMINOPHEN 325 MG PO TABS: 650.0000 mg | ORAL_TABLET | Freq: Four times a day (QID) | ORAL | Status: DC | PRN

## 2013-12-14 MED ORDER — POLYETHYLENE GLYCOL 3350 17 G PO PACK: 17.0000 g | PACK | Freq: Every day | ORAL | Status: DC | PRN

## 2013-12-14 MED ORDER — METHOCARBAMOL 500 MG PO TABS: 500.0000 mg | ORAL_TABLET | Freq: Four times a day (QID) | ORAL | Status: DC | PRN

## 2013-12-14 MED ORDER — TRAMADOL HCL 50 MG PO TABS
50.0000 mg | ORAL_TABLET | Freq: Four times a day (QID) | ORAL | Status: DC | PRN
Start: 2013-12-14 — End: 2014-02-13

## 2013-12-14 MED ORDER — RIVAROXABAN 10 MG PO TABS: 10.0000 mg | ORAL_TABLET | Freq: Every day | ORAL | Status: DC

## 2013-12-14 MED ORDER — HYDROCODONE-ACETAMINOPHEN 5-325 MG PO TABS: 1.0000 | ORAL_TABLET | ORAL | Status: DC | PRN

## 2013-12-14 MED ORDER — ONDANSETRON HCL 4 MG PO TABS: 4.0000 mg | ORAL_TABLET | Freq: Four times a day (QID) | ORAL | Status: DC | PRN

## 2013-12-14 MED ORDER — METOCLOPRAMIDE HCL 5 MG PO TABS: 5.0000 mg | ORAL_TABLET | Freq: Three times a day (TID) | ORAL | Status: DC | PRN

## 2013-12-14 NOTE — Progress Notes (Signed)
   Subjective: 3 Days Post-Op Procedure(s) (LRB): RIGHT TOTAL KNEE ARTHROPLASTY (Right) Patient reports pain as mild.   Patient seen in rounds with Dr. Wynelle Link. Patient is well, and has had no acute complaints or problems Patient is ready to go to [facility].  Objective: Vital signs in last 24 hours: Temp:  [97.9 F (36.6 C)-98.2 F (36.8 C)] 98 F (36.7 C) (02/12 0526) Pulse Rate:  [95-101] 95 (02/12 0526) Resp:  [16] 16 (02/12 0526) BP: (133-159)/(80-84) 143/84 mmHg (02/12 0526) SpO2:  [93 %-94 %] 93 % (02/12 0526)  Intake/Output from previous day:  Intake/Output Summary (Last 24 hours) at 12/14/13 0831 Last data filed at 12/14/13 2902  Gross per 24 hour  Intake   1240 ml  Output      0 ml  Net   1240 ml    Intake/Output this shift: Total I/O In: 460 [P.O.:460] Out: -   Labs:  Recent Labs  12/12/13 0438 12/13/13 0439 12/14/13 0440  HGB 12.3 11.4* 10.0*    Recent Labs  12/13/13 0439 12/14/13 0440  WBC 12.9* 10.6*  RBC 3.71* 3.26*  HCT 33.5* 29.6*  PLT 240 206    Recent Labs  12/12/13 0438 12/13/13 0439  NA 135* 137  K 3.4* 3.6*  CL 97 99  CO2 24 27  BUN 8 10  CREATININE 0.53 0.53  GLUCOSE 191* 159*  CALCIUM 8.2* 8.8   No results found for this basename: LABPT, INR,  in the last 72 hours  EXAM: General - Patient is Alert, Appropriate and Oriented Extremity - Neurovascular intact Sensation intact distally Incision - clean, dry, no drainage Motor Function - intact, moving foot and toes well on exam.   Assessment/Plan: 3 Days Post-Op Procedure(s) (LRB): RIGHT TOTAL KNEE ARTHROPLASTY (Right) Procedure(s) (LRB): RIGHT TOTAL KNEE ARTHROPLASTY (Right) Past Medical History  Diagnosis Date  . Diverticulosis   . Goiter, nodular     [place] of thyroid stable 7/08  . Osteopenia     Dr Pamala Hurry  . Vitamin D deficiency   . Mitral valve prolapse   . Heart murmur     due to rheumatic fever as child  . Complication of anesthesia     slow to  awaken  . PONV (postoperative nausea and vomiting)     n/v  . Neoplasm of lung, malignant 2002    Non-small cell   Principal Problem:   OA (osteoarthritis) of knee Active Problems:   Hyponatremia   Hypokalemia   Acute blood loss anemia  Estimated body mass index is 27.29 kg/(m^2) as calculated from the following:   Height as of this encounter: 5' 5.5" (1.664 m).   Weight as of this encounter: 75.569 kg (166 lb 9.6 oz). Discharge to SNF Diet - Regular diet Follow up - in 2 weeks Activity - WBAT Disposition - Skilled nursing facility Condition Upon Discharge - Good D/C Meds - See DC Summary DVT Prophylaxis - Xarelto  PERKINS, ALEXZANDREW 12/14/2013, 8:31 AM

## 2013-12-14 NOTE — Telephone Encounter (Signed)
[facility] 

## 2013-12-14 NOTE — Discharge Summary (Signed)
Physician Discharge Summary   Patient ID: Maria [name] MRN: [id number] DOB/AGE: 76-Mar-1937 76 y.o.  Admit date: 12/11/2013 Discharge date: 12-14-2013  Primary Diagnosis:  Osteoarthritis Right knee(s)  Admission Diagnoses:  Past Medical History  Diagnosis Date  . Diverticulosis   . Goiter, nodular     [place] of thyroid stable 7/08  . Osteopenia     Dr Pamala Hurry  . Vitamin D deficiency   . Mitral valve prolapse   . Heart murmur     due to rheumatic fever as child  . Complication of anesthesia     slow to awaken  . PONV (postoperative nausea and vomiting)     n/v  . Neoplasm of lung, malignant 2002    Non-small cell   Discharge Diagnoses:   Principal Problem:   OA (osteoarthritis) of knee Active Problems:   Hyponatremia   Hypokalemia   Acute blood loss anemia  Estimated body mass index is 27.29 kg/(m^2) as calculated from the following:   Height as of this encounter: 5' 5.5" (1.664 m).   Weight as of this encounter: 75.569 kg (166 lb 9.6 oz).  Procedure:  Procedure(s) (LRB): RIGHT TOTAL KNEE ARTHROPLASTY (Right)   Consults: None  HPI: Maria [name] is a 78 y.o. year old female with end stage OA of her right knee with progressively worsening pain and dysfunction. She has constant pain, with activity and at rest and significant functional deficits with difficulties even with ADLs. She has had extensive non-op management including analgesics, injections of cortisone, and home exercise program, but remains in significant pain with significant dysfunction.Radiographs show bone on bone arthritis lateral and patellofemoral. She presents now for right Total Knee Arthroplasty.   Laboratory Data: Admission on 12/11/2013  Component Date Value Ref Range Status  . ABO/RH(D) 12/11/2013 O POS   Final  . Antibody Screen 12/11/2013 NEG   Final  . Sample Expiration 12/11/2013 12/14/2013   Final  . ABO/RH(D) 12/11/2013 O POS   Final  . WBC 12/12/2013 11.9* 4.0 - 10.5 K/uL Final    . RBC 12/12/2013 3.96  3.87 - 5.11 MIL/uL Final  . Hemoglobin 12/12/2013 12.3  12.0 - 15.0 g/dL Final  . HCT 12/12/2013 35.6* 36.0 - 46.0 % Final  . MCV 12/12/2013 89.9  78.0 - 100.0 fL Final  . MCH 12/12/2013 31.1  26.0 - 34.0 pg Final  . MCHC 12/12/2013 34.6  30.0 - 36.0 g/dL Final  . RDW 12/12/2013 12.7  11.5 - 15.5 % Final  . Platelets 12/12/2013 240  150 - 400 K/uL Final  . Sodium 12/12/2013 135* 137 - 147 mEq/L Final  . Potassium 12/12/2013 3.4* 3.7 - 5.3 mEq/L Final  . Chloride 12/12/2013 97  96 - 112 mEq/L Final  . CO2 12/12/2013 24  19 - 32 mEq/L Final  . Glucose, Bld 12/12/2013 191* 70 - 99 mg/dL Final  . BUN 12/12/2013 8  6 - 23 mg/dL Final  . Creatinine, Ser 12/12/2013 0.53  0.50 - 1.10 mg/dL Final  . Calcium 12/12/2013 8.2* 8.4 - 10.5 mg/dL Final  . GFR calc non Af Amer 12/12/2013 90* >90 mL/min Final  . GFR calc Af Amer 12/12/2013 >90  >90 mL/min Final   Comment: (NOTE)                          The eGFR has been calculated using the CKD EPI equation.  This calculation has not been validated in all clinical situations.                          eGFR's persistently <90 mL/min signify possible Chronic Kidney                          Disease.  . WBC 12/13/2013 12.9* 4.0 - 10.5 K/uL Final  . RBC 12/13/2013 3.71* 3.87 - 5.11 MIL/uL Final  . Hemoglobin 12/13/2013 11.4* 12.0 - 15.0 g/dL Final  . HCT 12/13/2013 33.5* 36.0 - 46.0 % Final  . MCV 12/13/2013 90.3  78.0 - 100.0 fL Final  . MCH 12/13/2013 30.7  26.0 - 34.0 pg Final  . MCHC 12/13/2013 34.0  30.0 - 36.0 g/dL Final  . RDW 12/13/2013 12.8  11.5 - 15.5 % Final  . Platelets 12/13/2013 240  150 - 400 K/uL Final  . Sodium 12/13/2013 137  137 - 147 mEq/L Final  . Potassium 12/13/2013 3.6* 3.7 - 5.3 mEq/L Final  . Chloride 12/13/2013 99  96 - 112 mEq/L Final  . CO2 12/13/2013 27  19 - 32 mEq/L Final  . Glucose, Bld 12/13/2013 159* 70 - 99 mg/dL Final  . BUN 12/13/2013 10  6 - 23 mg/dL Final  .  Creatinine, Ser 12/13/2013 0.53  0.50 - 1.10 mg/dL Final  . Calcium 12/13/2013 8.8  8.4 - 10.5 mg/dL Final  . GFR calc non Af Amer 12/13/2013 90* >90 mL/min Final  . GFR calc Af Amer 12/13/2013 >90  >90 mL/min Final   Comment: (NOTE)                          The eGFR has been calculated using the CKD EPI equation.                          This calculation has not been validated in all clinical situations.                          eGFR's persistently <90 mL/min signify possible Chronic Kidney                          Disease.  . WBC 12/14/2013 10.6* 4.0 - 10.5 K/uL Final  . RBC 12/14/2013 3.26* 3.87 - 5.11 MIL/uL Final  . Hemoglobin 12/14/2013 10.0* 12.0 - 15.0 g/dL Final  . HCT 12/14/2013 29.6* 36.0 - 46.0 % Final  . MCV 12/14/2013 90.8  78.0 - 100.0 fL Final  . MCH 12/14/2013 30.7  26.0 - 34.0 pg Final  . MCHC 12/14/2013 33.8  30.0 - 36.0 g/dL Final  . RDW 12/14/2013 13.1  11.5 - 15.5 % Final  . Platelets 12/14/2013 206  150 - 400 K/uL Final  Hospital Outpatient Visit on 12/04/2013  Component Date Value Ref Range Status  . MRSA, PCR 12/04/2013 NEGATIVE  NEGATIVE Final  . Staphylococcus aureus 12/04/2013 NEGATIVE  NEGATIVE Final   Comment:                                 The Xpert SA Assay (FDA  approved for NASAL specimens                          in patients over 96 years of age),                          is one component of                          a comprehensive surveillance                          program.  Test performance has                          been validated by [employer] for patients greater                          than or equal to 80 year old.                          It is not intended                          to diagnose infection nor to                          guide or monitor treatment.  Marland Kitchen aPTT 12/04/2013 30  24 - 37 seconds Final  . WBC 12/04/2013 8.9  4.0 - 10.5 K/uL Final  . RBC 12/04/2013 4.76  3.87 -  5.11 MIL/uL Final  . Hemoglobin 12/04/2013 14.9  12.0 - 15.0 g/dL Final  . HCT 12/04/2013 43.3  36.0 - 46.0 % Final  . MCV 12/04/2013 91.0  78.0 - 100.0 fL Final  . MCH 12/04/2013 31.3  26.0 - 34.0 pg Final  . MCHC 12/04/2013 34.4  30.0 - 36.0 g/dL Final  . RDW 12/04/2013 12.6  11.5 - 15.5 % Final  . Platelets 12/04/2013 259  150 - 400 K/uL Final  . Sodium 12/04/2013 141  137 - 147 mEq/L Final  . Potassium 12/04/2013 4.1  3.7 - 5.3 mEq/L Final  . Chloride 12/04/2013 101  96 - 112 mEq/L Final  . CO2 12/04/2013 29  19 - 32 mEq/L Final  . Glucose, Bld 12/04/2013 94  70 - 99 mg/dL Final  . BUN 12/04/2013 17  6 - 23 mg/dL Final  . Creatinine, Ser 12/04/2013 0.59  0.50 - 1.10 mg/dL Final  . Calcium 12/04/2013 8.9  8.4 - 10.5 mg/dL Final  . Total Protein 12/04/2013 7.0  6.0 - 8.3 g/dL Final  . Albumin 12/04/2013 3.9  3.5 - 5.2 g/dL Final  . AST 12/04/2013 28  0 - 37 U/L Final  . ALT 12/04/2013 19  0 - 35 U/L Final  . Alkaline Phosphatase 12/04/2013 98  39 - 117 U/L Final  . Total Bilirubin 12/04/2013 0.7  0.3 - 1.2 mg/dL Final  . GFR calc non Af Amer 12/04/2013 87* >90 mL/min Final  . GFR calc Af Amer 12/04/2013 >90  >90 mL/min Final   Comment: (NOTE)  The eGFR has been calculated using the CKD EPI equation.                          This calculation has not been validated in all clinical situations.                          eGFR's persistently <90 mL/min signify possible Chronic Kidney                          Disease.  Marland Kitchen Prothrombin Time 12/04/2013 12.1  11.6 - 15.2 seconds Final  . INR 12/04/2013 0.91  0.00 - 1.49 Final  . Color, Urine 12/04/2013 YELLOW  YELLOW Final  . APPearance 12/04/2013 CLEAR  CLEAR Final  . Specific Gravity, Urine 12/04/2013 1.005  1.005 - 1.030 Final  . pH 12/04/2013 7.0  5.0 - 8.0 Final  . Glucose, UA 12/04/2013 NEGATIVE  NEGATIVE mg/dL Final  . Hgb urine dipstick 12/04/2013 NEGATIVE  NEGATIVE Final  . Bilirubin Urine 12/04/2013  NEGATIVE  NEGATIVE Final  . Ketones, ur 12/04/2013 NEGATIVE  NEGATIVE mg/dL Final  . Protein, ur 12/04/2013 NEGATIVE  NEGATIVE mg/dL Final  . Urobilinogen, UA 12/04/2013 0.2  0.0 - 1.0 mg/dL Final  . Nitrite 12/04/2013 NEGATIVE  NEGATIVE Final  . Leukocytes, UA 12/04/2013 NEGATIVE  NEGATIVE Final   MICROSCOPIC NOT DONE ON URINES WITH NEGATIVE PROTEIN, BLOOD, LEUKOCYTES, NITRITE, OR GLUCOSE <1000 mg/dL.  Office Visit on 11/14/2013  Component Date Value Ref Range Status  . TSH 11/14/2013 1.52  0.35 - 5.50 uIU/mL Final     X-Rays:No results found.  EKG: Orders placed in visit on 04/26/09  . CONVERTED CEMR EKG     Hospital Course: Maria [name] is a 78 y.o. who was admitted to [facility]. They were brought to the operating room on 12/11/2013 and underwent Procedure(s): RIGHT TOTAL KNEE ARTHROPLASTY.  Patient tolerated the procedure well and was later transferred to the recovery room and then to the orthopaedic floor for postoperative care.  They were given PO and IV analgesics for pain control following their surgery.  They were given 24 hours of postoperative antibiotics of  Anti-infectives   Start     Dose/Rate Route Frequency Ordered Stop   12/11/13 2000  ceFAZolin (ANCEF) IVPB 2 g/50 mL premix     2 g 100 mL/hr over 30 Minutes Intravenous Every 6 hours 12/11/13 1746 12/12/13 0239   12/11/13 1345  ceFAZolin (ANCEF) IVPB 2 g/50 mL premix     2 g 100 mL/hr over 30 Minutes Intravenous  Once 12/11/13 1324 12/11/13 1400     and started on DVT prophylaxis in the form of Xarelto.   PT and OT were ordered for total joint protocol.  Discharge planning consulted to help with postop disposition and equipment needs.  Patient had a tough night on the evening of surgery.  They started to get up OOB with therapy on day one. Hemovac drain was pulled without difficulty.  On day two, the patient was seen in rounds for Dr. Wynelle Link. She had a rough night and had some confusion with the meds.  She was doing better and was clear that next morning on day two. Continued to work with therapy into day two.  Dressing was changed on day two and the incision was healing well.  By day three, the patient had progressed with therapy and meeting  their goals.  Incision was healing well.  Patient was seen in rounds and was ready to go to [facility].   Discharge Medications: Prior to Admission medications   Medication Sig Start Date End Date Taking? Authorizing Provider  psyllium (METAMUCIL) 58.6 % powder Take 1 packet by mouth daily. 1 teaspoon   Yes Historical Provider, MD  acetaminophen (TYLENOL) 325 MG tablet Take 2 tablets (650 mg total) by mouth every 6 (six) hours as needed for mild pain (or Fever >/= 101). 12/14/13   Kingsley Farace Dara Lords, PA-C  bisacodyl (DULCOLAX) 10 MG suppository Place 1 suppository (10 mg total) rectally daily as needed for moderate constipation. 12/14/13   Bryanna Yim, PA-C  docusate sodium 100 MG CAPS Take 100 mg by mouth 2 (two) times daily. 12/14/13   Paidyn Mcferran Dara Lords, PA-C  HYDROcodone-acetaminophen (NORCO/VICODIN) 5-325 MG per tablet Take 1-2 tablets by mouth every 4 (four) hours as needed for moderate pain. 12/14/13   Jaquetta Currier Dara Lords, PA-C  methocarbamol (ROBAXIN) 500 MG tablet Take 1 tablet (500 mg total) by mouth every 6 (six) hours as needed for muscle spasms. 12/14/13   Quantel Mcinturff Dara Lords, PA-C  metoCLOPramide (REGLAN) 5 MG tablet Take 1-2 tablets (5-10 mg total) by mouth every 8 (eight) hours as needed for nausea (if ondansetron (ZOFRAN) ineffective.). 12/14/13   Cecilia Vancleve, PA-C  ondansetron (ZOFRAN) 4 MG tablet Take 1 tablet (4 mg total) by mouth every 6 (six) hours as needed for nausea. 12/14/13   Jedd Schulenburg, PA-C  polyethylene glycol (MIRALAX / GLYCOLAX) packet Take 17 g by mouth daily as needed for mild constipation. 12/14/13   Kyleen Villatoro Dara Lords, PA-C  rivaroxaban (XARELTO) 10 MG TABS tablet Take 1 tablet (10 mg total) by mouth  daily with breakfast. Take Xarelto for two and a half more weeks, then discontinue Xarelto. Once the patient has completed the blood thinner regimen, then take a Baby 81 mg Aspirin daily for four more weeks. 12/14/13   Deontre Allsup Dara Lords, PA-C  traMADol (ULTRAM) 50 MG tablet Take 1-2 tablets (50-100 mg total) by mouth every 6 (six) hours as needed (mild to moderate pain). 12/14/13   Berit Raczkowski Dara Lords, PA-C   Discharge to SNF  Diet - Regular diet  Follow up - in 2 weeks  Activity - WBAT  Disposition - Skilled nursing facility  Condition Upon Discharge - Good  D/C Meds - See DC Summary  DVT Prophylaxis - Xarelto       Discharge Orders   Future Orders Complete By Expires   Call MD / Call 911  As directed    Comments:     If you experience chest pain or shortness of breath, CALL 911 and be transported to the hospital emergency room.  If you develope a fever above 101 F, pus (white drainage) or increased drainage or redness at the wound, or calf pain, call your surgeon's office.   Change dressing  As directed    Comments:     Change dressing daily with sterile 4 x 4 inch gauze dressing and apply TED hose. Do not submerge the incision under water.   Constipation Prevention  As directed    Comments:     Drink plenty of fluids.  Prune juice may be helpful.  You may use a stool softener, such as Colace (over the counter) 100 mg twice a day.  Use MiraLax (over the counter) for constipation as needed.   Diet - low sodium heart healthy  As directed    Discharge instructions  As directed  Comments:     Pick up stool softner and laxative for home. Do not submerge incision under water. May shower. Continue to use ice for pain and swelling from surgery.  Take Xarelto for two and a half more weeks, then discontinue Xarelto. Once the patient has completed the blood thinner regimen, then take a Baby 81 mg Aspirin daily for four more weeks.  When discharged from the skilled rehab facility,  please have the facility set up the patient's [place] prior to being released.  Also provide the patient with their medications at time of release from the facility to include their pain medication, the muscle relaxants, and their blood thinner medication.  If the patient is still at the rehab facility at time of follow up appointment, please also assist the patient in arranging follow up appointment in our office and any transportation needs.   Do not put a pillow under the knee. Place it under the heel.  As directed    Do not sit on low chairs, stoools or toilet seats, as it may be difficult to get up from low surfaces  As directed    Driving restrictions  As directed    Comments:     No driving until released by the physician.   Increase activity slowly as tolerated  As directed    Lifting restrictions  As directed    Comments:     No lifting until released by the physician.   Patient may shower  As directed    Comments:     You may shower without a dressing once there is no drainage.  Do not wash over the wound.  If drainage remains, do not shower until drainage stops.   TED hose  As directed    Comments:     Use stockings (TED hose) for 3 weeks on both leg(s).  You may remove them at night for sleeping.   Weight bearing as tolerated  As directed    Questions:     Laterality:     Extremity:         Medication List    STOP taking these medications       ADVIL 200 MG Caps  Generic drug:  Ibuprofen     CALCIUM + D PO     cholecalciferol 1000 UNITS tablet  Commonly known as:  VITAMIN D     Fish Oil 1000 MG Caps     multivitamin tablet      TAKE these medications       acetaminophen 325 MG tablet  Commonly known as:  TYLENOL  Take 2 tablets (650 mg total) by mouth every 6 (six) hours as needed for mild pain (or Fever >/= 101).     bisacodyl 10 MG suppository  Commonly known as:  DULCOLAX  Place 1 suppository (10 mg total) rectally daily as needed  for moderate constipation.     DSS 100 MG Caps  Take 100 mg by mouth 2 (two) times daily.     HYDROcodone-acetaminophen 5-325 MG per tablet  Commonly known as:  NORCO/VICODIN  Take 1-2 tablets by mouth every 4 (four) hours as needed for moderate pain.     methocarbamol 500 MG tablet  Commonly known as:  ROBAXIN  Take 1 tablet (500 mg total) by mouth every 6 (six) hours as needed for muscle spasms.     metoCLOPramide 5 MG tablet  Commonly known as:  REGLAN  Take 1-2 tablets (5-10 mg total) by  mouth every 8 (eight) hours as needed for nausea (if ondansetron (ZOFRAN) ineffective.).     ondansetron 4 MG tablet  Commonly known as:  ZOFRAN  Take 1 tablet (4 mg total) by mouth every 6 (six) hours as needed for nausea.     polyethylene glycol packet  Commonly known as:  MIRALAX / GLYCOLAX  Take 17 g by mouth daily as needed for mild constipation.     psyllium 58.6 % powder  Commonly known as:  METAMUCIL  Take 1 packet by mouth daily. 1 teaspoon     rivaroxaban 10 MG Tabs tablet  Commonly known as:  XARELTO  - Take 1 tablet (10 mg total) by mouth daily with breakfast. Take Xarelto for two and a half more weeks, then discontinue Xarelto.  - Once the patient has completed the blood thinner regimen, then take a Baby 81 mg Aspirin daily for four more weeks.     traMADol 50 MG tablet  Commonly known as:  ULTRAM  Take 1-2 tablets (50-100 mg total) by mouth every 6 (six) hours as needed (mild to moderate pain).       Follow-up Information   Follow up with Gearlean Alf, MD. Schedule an appointment as soon as possible for a visit on 12/26/2013.   Specialty:  Orthopedic Surgery   Contact information:   [address] [place] [address] [phone]       Signed: Mickel Crow 12/14/2013, 8:45 AM

## 2013-12-14 NOTE — Progress Notes (Signed)
Report called to Sunday Spillers, Therapist, sports at [facility].  Pt transported via [facility] to [facility] and all paperwork sent.

## 2013-12-14 NOTE — Progress Notes (Signed)
Clinical Social Work Department CLINICAL SOCIAL WORK PLACEMENT NOTE 12/14/2013  Patient:  Maria [name], Maria [name]  Account Number:  [id number] Admit date:  12/11/2013  Clinical Social Worker:  Werner Lean, LCSW  Date/time:  12/12/2013 01:18 PM  Clinical Social Work is seeking post-discharge placement for this patient at the following level of care:   SKILLED NURSING   (*CSW will update this form in Epic as items are completed)     Patient/family provided with [facility] Department of Clinical Social Work's list of facilities offering this level of care within the geographic area requested by the patient (or if unable, by the patient's family).  12/12/2013  Patient/family informed of their freedom to choose among providers that offer the needed level of care, that participate in Medicare, Medicaid or managed care program needed by the patient, have an available bed and are willing to accept the patient.    Patient/family informed of [facility]' ownership interest in [facility], as well as of the fact that they are under no obligation to receive care at this facility.  PASARR submitted to EDS on 12/12/2013 PASARR number received from EDS on 12/12/2013  FL2 transmitted to all facilities in geographic area requested by pt/family on  12/12/2013 FL2 transmitted to all facilities within larger geographic area on   Patient informed that his/her managed care company has contracts with or will negotiate with  certain facilities, including the following:     Patient/family informed of bed offers received:  12/12/2013 Patient chooses bed at [facility] Physician recommends and patient chooses bed at    Patient to be transferred to [facility] on  12/14/2013 Patient to be transferred to facility by [facility]  The following physician request were entered in Epic:   Additional Comments:   Werner Lean LCSW [phone]

## 2013-12-14 NOTE — Progress Notes (Signed)
Physical Therapy Treatment Patient Details Name: DEANNDRA KIRLEY MRN: [id number] DOB: [birth year] Today's Date: 12/14/2013 Time: 8264-1583 PT Time Calculation (min): 26 min  PT Assessment / Plan / Recommendation  History of Present Illness s/p R TKR   PT Comments   Pt mobilizing well and performed exercises.  Pt to d/c to SNF today.  Follow Up Recommendations  SNF     Does the patient have the potential to tolerate intense rehabilitation     Barriers to Discharge        Equipment Recommendations  None recommended by PT    Recommendations for Other Services    Frequency 7X/week   Progress towards PT Goals Progress towards PT goals: Progressing toward goals  Plan Current plan remains appropriate    Precautions / Restrictions Precautions Precautions: Knee Required Braces or Orthoses: Knee Immobilizer - Right Knee Immobilizer - Right: Discontinue once straight leg raise with < 10 degree lag Restrictions RLE Weight Bearing: Weight bearing as tolerated   Pertinent Vitals/Pain Premedicated, ice packs applied    Mobility  Transfers Overall transfer level: Needs assistance Equipment used: Rolling walker (2 wheeled) Transfers: Sit to/from Stand Sit to Stand: Supervision General transfer comment: verbal cues for safe technique Ambulation/Gait Ambulation/Gait assistance: Supervision Ambulation Distance (Feet): 160 Feet Assistive device: Rolling walker (2 wheeled) Gait Pattern/deviations: Step-to pattern;Antalgic Gait velocity: decr General Gait Details: verbal cues for sequence, RW distance, step length    Exercises Total Joint Exercises Ankle Circles/Pumps: AROM;Both;15 reps Quad Sets: AROM;Both;15 reps Short Arc QuadSinclair Ship;Right;15 reps Heel Slides: AAROM;Right;15 reps;Seated Hip ABduction/ADduction: AROM;Right;10 reps Straight Leg Raises: 10 reps;AAROM;Right   PT Diagnosis:    PT Problem List:   PT Treatment Interventions:     PT Goals (current goals can now  be found in the care plan section)    Visit Information  Last PT Received On: 12/14/13 Assistance Needed: +1 History of Present Illness: s/p R TKR    Subjective Data      Cognition  Cognition Arousal/Alertness: Awake/alert Behavior During Therapy: WFL for tasks assessed/performed Overall Cognitive Status: Within Functional Limits for tasks assessed    Balance     End of Session PT - End of Session Equipment Utilized During Treatment: Right knee immobilizer Activity Tolerance: Patient tolerated treatment well Patient left: in chair;with call bell/phone within reach   GP     Sibley Rolison,KATHrine E 12/14/2013, 11:53 AM Carmelia Bake, PT, DPT 12/14/2013 Pager: [phone]

## 2013-12-15 ENCOUNTER — Encounter: Payer: Self-pay | Admitting: Adult Health

## 2013-12-15 ENCOUNTER — Non-Acute Institutional Stay (SKILLED_NURSING_FACILITY): Payer: Medicare Other | Admitting: Adult Health

## 2013-12-15 ENCOUNTER — Other Ambulatory Visit: Payer: Self-pay | Admitting: *Deleted

## 2013-12-15 DIAGNOSIS — D62 Acute posthemorrhagic anemia: Secondary | ICD-10-CM

## 2013-12-15 DIAGNOSIS — M179 Osteoarthritis of knee, unspecified: Secondary | ICD-10-CM

## 2013-12-15 DIAGNOSIS — M1710 Unilateral primary osteoarthritis, unspecified knee: Secondary | ICD-10-CM

## 2013-12-15 DIAGNOSIS — E876 Hypokalemia: Secondary | ICD-10-CM

## 2013-12-15 DIAGNOSIS — IMO0002 Reserved for concepts with insufficient information to code with codable children: Secondary | ICD-10-CM

## 2013-12-15 DIAGNOSIS — K5900 Constipation, unspecified: Secondary | ICD-10-CM

## 2013-12-15 MED ORDER — HYDROCODONE-ACETAMINOPHEN 5-325 MG PO TABS: ORAL_TABLET | ORAL | Status: DC

## 2013-12-15 NOTE — Telephone Encounter (Signed)
[facility] 

## 2013-12-15 NOTE — Care Management Note (Signed)
    Page 1 of 1   12/15/2013     4:27:23 PM   CARE MANAGEMENT NOTE 12/15/2013  Patient:  Maria [name], Maria [name]   Account Number:  [id number]  Date Initiated:  12/14/2013  Documentation initiated by:  Sherrin Daisy  Subjective/Objective Assessment:   dx total knee arthroplasty     Action/Plan:   SNF rehab   Anticipated DC Date:     Anticipated DC Plan:  [place] referral  Clinical Social Worker      DC Planning Services  CM consult      Choice offered to / List presented to:             Status of service:  Completed, signed off Medicare Important Message given?  NA - LOS <3 / Initial given by admissions (If response is "NO", the following Medicare IM given date fields will be blank) Date Medicare IM given:   Date Additional Medicare IM given:    Discharge Disposition:  [place]  Per UR Regulation:    If discussed at Long Length of Stay Meetings, dates discussed:    Comments:

## 2013-12-15 NOTE — Progress Notes (Signed)
Patient ID: Maria [name], female   DOB: January 17, 1936, 78 y.o.   MRN: [id number]               PROGRESS NOTE  DATE: 12/15/2013  FACILITY: Nursing Home Location: [facility]  LEVEL OF CARE: SNF (31)  Acute Visit  CHIEF COMPLAINT:  Follow-up hospitalization  HISTORY OF PRESENT ILLNESS: This is a 78 year old female who has been admitted to [facility] on 12/14/13 from [facility] with Osteoarthritis S/P Right Total Knee arthroplasty. He has been admitted for a short-term rehabilitation.  REASSESSMENT OF ONGOING PROBLEM(S):  ANEMIA: The anemia has been stable. The patient denies fatigue, melena or hematochezia. No complications from the medications currently being used. 2/15 11.4  CONSTIPATION: The constipation remains stable. No complications from the medications presently being used. Patient denies ongoing constipation, abdominal pain, nausea or vomiting.  HYPOKALEMIA: The patient's hypokalemia remains stable. Patient denies muscle cramping or palpitations. No complications reported from current potassium supplementation. 2/15 K 3.6  PAST MEDICAL HISTORY : Reviewed.  No changes.  CURRENT MEDICATIONS: Reviewed per [facility]  REVIEW OF SYSTEMS:  GENERAL: no change in appetite, no fatigue, no weight changes, no fever, chills or weakness RESPIRATORY: no cough, SOB, DOE, wheezing, hemoptysis CARDIAC: no chest pain, edema or palpitations GI: no abdominal pain, diarrhea, constipation, heart burn, nausea or vomiting  PHYSICAL EXAMINATION  VS:  T99.5       P96      RR16      BP109/69     POX94 %       GENERAL: no acute distress, normal body habitus EYES: conjunctivae normal, sclerae normal, normal eye lids NECK: supple, trachea midline, no neck masses, no thyroid tenderness, no thyromegaly LYMPHATICS: no LAN in the neck, no supraclavicular LAN RESPIRATORY: breathing is even & unlabored, BS CTAB CARDIAC: RRR, no murmur,no extra heart sounds, no edema GI:  abdomen soft, normal BS, no masses, no tenderness, no hepatomegaly, no splenomegaly PSYCHIATRIC: the patient is alert & oriented to person, affect & behavior appropriate  LABS/RADIOLOGY: Labs reviewed: Basic Metabolic Panel:  Recent Labs  12/04/13 1000 12/12/13 0438 12/13/13 0439  NA 141 135* 137  K 4.1 3.4* 3.6*  CL 101 97 99  CO2 29 24 27   GLUCOSE 94 191* 159*  BUN 17 8 10   CREATININE 0.59 0.53 0.53  CALCIUM 8.9 8.2* 8.8   Liver Function Tests:  Recent Labs  12/04/13 1000  AST 28  ALT 19  ALKPHOS 98  BILITOT 0.7  PROT 7.0  ALBUMIN 3.9   CBC:  Recent Labs  12/12/13 0438 12/13/13 0439 12/14/13 0440  WBC 11.9* 12.9* 10.6*  HGB 12.3 11.4* 10.0*  HCT 35.6* 33.5* 29.6*  MCV 89.9 90.3 90.8  PLT 240 240 206   Lipid Panel:  Recent Labs  07/14/13 0803  HDL 60.50      ASSESSMENT/PLAN:  Osteoarthritis S/P Right Total Knee Arthroplasty - for rehabilitation Constipation - no complaints Hypokalemia - stable; check BMP Anemia - stable; check CBC  CPT CODE: 33825

## 2013-12-15 NOTE — Telephone Encounter (Signed)
[facility] 

## 2014-01-24 ENCOUNTER — Other Ambulatory Visit: Payer: Self-pay

## 2014-01-24 DIAGNOSIS — Z1231 Encounter for screening mammogram for malignant neoplasm of breast: Secondary | ICD-10-CM

## 2014-02-05 ENCOUNTER — Ambulatory Visit
Admission: RE | Admit: 2014-02-05 | Discharge: 2014-02-05 | Disposition: A | Discharge status: Home / Self Care | Payer: Medicare Other | Source: Ambulatory Visit

## 2014-02-05 DIAGNOSIS — Z1231 Encounter for screening mammogram for malignant neoplasm of breast: Secondary | ICD-10-CM

## 2014-02-13 ENCOUNTER — Encounter: Payer: Self-pay | Admitting: Internal Medicine

## 2014-02-13 ENCOUNTER — Ambulatory Visit (INDEPENDENT_AMBULATORY_CARE_PROVIDER_SITE_OTHER): Payer: Medicare Other | Admitting: Internal Medicine

## 2014-02-13 ENCOUNTER — Other Ambulatory Visit (INDEPENDENT_AMBULATORY_CARE_PROVIDER_SITE_OTHER): Payer: Medicare Other

## 2014-02-13 VITALS — BP 130/90 | HR 82 | Temp 97.6°F | Wt 157.0 lb

## 2014-02-13 DIAGNOSIS — R739 Hyperglycemia, unspecified: Secondary | ICD-10-CM

## 2014-02-13 DIAGNOSIS — R5381 Other malaise: Secondary | ICD-10-CM

## 2014-02-13 DIAGNOSIS — R5383 Other fatigue: Secondary | ICD-10-CM

## 2014-02-13 DIAGNOSIS — R7309 Other abnormal glucose: Secondary | ICD-10-CM

## 2014-02-13 DIAGNOSIS — R630 Anorexia: Secondary | ICD-10-CM

## 2014-02-13 DIAGNOSIS — D62 Acute posthemorrhagic anemia: Secondary | ICD-10-CM

## 2014-02-13 LAB — CBC WITH DIFFERENTIAL/PLATELET
Basophils Absolute: 0 10*3/uL (ref 0.0–0.1)
Basophils Relative: 0.5 % (ref 0.0–3.0)
Eosinophils Absolute: 0.1 10*3/uL (ref 0.0–0.7)
Eosinophils Relative: 1 % (ref 0.0–5.0)
HCT: 44 % (ref 36.0–46.0)
Hemoglobin: 14.6 g/dL (ref 12.0–15.0)
Lymphocytes Relative: 37.9 % (ref 12.0–46.0)
Lymphs Abs: 2.8 10*3/uL (ref 0.7–4.0)
MCHC: 33.2 g/dL (ref 30.0–36.0)
MCV: 91.6 fl (ref 78.0–100.0)
Monocytes Absolute: 0.7 10*3/uL (ref 0.1–1.0)
Monocytes Relative: 8.7 % (ref 3.0–12.0)
Neutro Abs: 3.9 10*3/uL (ref 1.4–7.7)
Neutrophils Relative %: 51.9 % (ref 43.0–77.0)
Platelets: 265 10*3/uL (ref 150.0–400.0)
RBC: 4.8 Mil/uL (ref 3.87–5.11)
RDW: 13.1 % (ref 11.5–14.6)
WBC: 7.5 10*3/uL (ref 4.5–10.5)

## 2014-02-13 LAB — TSH: TSH: 1.47 u[IU]/mL (ref 0.35–5.50)

## 2014-02-13 LAB — HEMOGLOBIN A1C: Hgb A1c MFr Bld: 5.6 % (ref 4.6–6.5)

## 2014-02-13 MED ORDER — VENLAFAXINE HCL ER 75 MG PO CP24: 75.0000 mg | ORAL_CAPSULE | Freq: Every day | ORAL | Status: DC

## 2014-02-13 NOTE — Progress Notes (Signed)
   Subjective:    Patient ID: Maria [name], female    DOB: 08-07-1936, 78 y.o.   MRN: [id number]  HPI She is s/p R total knee replacement on 12/11/13 and reports decreased interest in activities, appetite, and energy level. She reports also being tearful since the surgery.  She denies suicidal ideation or feelings of wanting to harm herself or others.  She denies any physical pain, injury or trauma. She has lost 15 pounds since her surgery.   She has found water aerobics and strength training to be of some benefit to her mood and energy level.    Review of Systems No associated fever, chills, sweats, or weight loss are described. Chest pain, palpitations, edema, calf pain, or claudication are denied. Cough, dyspnea, sputum production, or hemoptysis are absent. There is no associated back pain with anterior radiation of discomfort. No associated weakness, numbness/tingling in arms. Absent are dyspepsia, significant reflux or dysphagia. No changes in skin temperature or color in the area of the symptoms. Rash or skin lesions absent.     Objective:   Physical Exam General appearance is one of good health and nourishment w/o distress. Eyes: No conjunctival inflammation or scleral icterus is present. Oral exam: Dental hygiene is good; lips and gums are healthy appearing.There is no oropharyngeal erythema or exudate noted.  Heart:  Normal rate and regular rhythm. S1 and S2 normal without gallop, murmur, click, rub or other extra sounds   Lungs:Chest clear to auscultation; no wheezes, rhonchi,rales ,or rubs present.No increased work of breathing.  Abdomen: bowel sounds normal, soft and non-tender without masses, organomegaly or hernias noted.  No guarding or rebound . No tenderness over the flanks to percussion Musculoskeletal: Able to lie flat and sit up without help. Negative straight leg raising bilaterally. Gait normal Skin:Warm & dry.  Intact without suspicious lesions or rashes ; no  jaundice or tenting Lymphatic: No lymphadenopathy is noted about the head, neck, axilla, or inguinal areas.       Assessment & Plan:  # decreased mood - TSH, B12?

## 2014-02-13 NOTE — Progress Notes (Signed)
   Subjective:    Patient ID: Maria [name], female    DOB: [birth year], 78 y.o.   MRN: [id number]  HPI   She is s/p R total knee replacement on 12/11/13 and reports decreased interest in activities, appetite, and energy level. She reports also being tearful since the surgery.  She denies suicidal ideation or feelings of wanting to harm herself or others.  She denies any physical pain, injury or trauma. She has lost 15 pounds since her surgery.   She [facility] of some benefit in mood and energy level. She reports she has the energy to perform ADLs and social interactions, however she has no desire.   She reports that in the past she used Cymbalta short term, with some benefit.   Review of Systems No associated fever, chills, sweats, or weight loss are described.  Chest pain, palpitations, edema, calf pain, or claudication are denied.  Cough, dyspnea, sputum production, or hemoptysis are absent.  There is no associated back pain with anterior radiation of discomfort.  No associated weakness, numbness/tingling in arms.  Absent are dyspepsia, significant reflux or dysphagia.  No changes in skin temperature or color in the area of the symptoms.  Rash or skin lesions absent.     Objective:   Physical Exam  General appearance is one of good health and nourishment w/o distress.  Eyes: No conjunctival inflammation or scleral icterus is present.  Oral exam: Dental hygiene is good; lips and gums are healthy appearing.There is no oropharyngeal erythema or exudate noted.  Heart: Normal rate and regular rhythm. S1 and S2 normal without gallop, murmur, click, rub or other extra sounds  Lungs:Chest clear to auscultation; no wheezes, rhonchi,rales ,or rubs present.No increased work of breathing.  Abdomen: bowel sounds normal, soft and non-tender without masses, organomegaly or hernias noted. No guarding or rebound . No  tenderness over the flanks to percussion  Musculoskeletal: Decreased DTR at R knee; well-healing scar at R knee.  Able to lie flat and sit up without help. Negative straight leg raising bilaterally. Gait normal.  Skin:Warm & dry. Intact without suspicious lesions or rashes ; no jaundice or tenting  Lymphatic: No lymphadenopathy is noted about the head, neck, axilla    Assessment & Plan:  #1 malaise s/p surgery - BMP #2 blood loss anemia - CBC  #3 anorexia s/p surgery - TSH #4 situational depression See orders

## 2014-02-13 NOTE — Progress Notes (Signed)
Pre visit review using our clinic review tool, if applicable. No additional management support is needed unless otherwise documented below in the visit note. 

## 2014-02-13 NOTE — Patient Instructions (Signed)
Your next office appointment will be determined based upon review of your pending labs . Those instructions will be transmitted to you through My Chart or by mail. Followup as needed for your acute issue. Please report any significant change in your symptoms.

## 2014-02-19 ENCOUNTER — Encounter: Payer: Self-pay | Admitting: Internal Medicine

## 2014-03-04 ENCOUNTER — Encounter: Payer: Self-pay | Admitting: Internal Medicine

## 2014-03-07 ENCOUNTER — Encounter: Payer: Self-pay | Admitting: Internal Medicine

## 2014-03-22 ENCOUNTER — Encounter: Payer: Self-pay | Admitting: Internal Medicine

## 2014-03-29 ENCOUNTER — Encounter: Payer: Self-pay | Admitting: Internal Medicine

## 2014-03-29 ENCOUNTER — Ambulatory Visit (INDEPENDENT_AMBULATORY_CARE_PROVIDER_SITE_OTHER): Payer: Medicare Other | Admitting: Internal Medicine

## 2014-03-29 VITALS — BP 140/90 | HR 81 | Temp 98.2°F | Resp 14 | Wt 155.6 lb

## 2014-03-29 DIAGNOSIS — R030 Elevated blood-pressure reading, without diagnosis of hypertension: Secondary | ICD-10-CM

## 2014-03-29 DIAGNOSIS — M810 Age-related osteoporosis without current pathological fracture: Secondary | ICD-10-CM

## 2014-03-29 DIAGNOSIS — E559 Vitamin D deficiency, unspecified: Secondary | ICD-10-CM

## 2014-03-29 NOTE — Progress Notes (Signed)
   Subjective:    Patient ID: Maria [name], female    DOB: Sep 07, 1936, 77 y.o.   MRN: [id number]  HPI  The bone density results were reviewed and risk and options discussed. An oral bisphosphonate weekly was recommended. This time she prefers to continue her weightbearing exercise daily, vitamin D supplementation, and calcium supplementation.  She will verify her last vitamin D level. The value on record is low at 2; this was in 2011.    Review of Systems  She has not been monitoring blood pressure at home; she's never had a diagnosis of hypertension  She finds the generic Effexor very effective     Objective:   Physical Exam  She appears much younger than her stated age. She is healthy and well-nourished  She has no scleral icterus or conjunctivitis  There is a slight lordotic curve of the upper thoracic spine  She has isolated PIP osteoarthritic changes in hands  There is marked crepitus of knees, greater on the right than the left  There is full range of motion & normal strength, and tone.  Rhythm and rate are regular with no murmur or gallop.  Chest is clear. Breath sounds are slightly decreased.  She has no aortic aneurysm or renal artery bruits. Extremities reveal no cyanosis, clubbing, or edema.        Assessment & Plan:  1 osteoporosis; see update  #2 elevated blood pressure without diagnosis of hypertension. She will monitor and nothing of it averages over 140/90.

## 2014-03-29 NOTE — Assessment & Plan Note (Signed)
Risks & options discussed; Fosamx 70 mg once weekly recommended She declines at this time.  See every 25 months; vitamin D level annually. She will continue the excellent weight bearing exercise program.

## 2014-03-29 NOTE — Patient Instructions (Signed)

## 2014-03-29 NOTE — Progress Notes (Signed)
Pre visit review using our clinic review tool, if applicable. No additional management support is needed unless otherwise documented below in the visit note. 

## 2014-05-18 ENCOUNTER — Other Ambulatory Visit: Payer: Self-pay | Admitting: Internal Medicine

## 2014-05-20 ENCOUNTER — Encounter: Payer: Self-pay | Admitting: Internal Medicine

## 2014-05-21 ENCOUNTER — Other Ambulatory Visit: Payer: Self-pay | Admitting: Internal Medicine

## 2014-05-21 MED ORDER — VENLAFAXINE HCL ER 75 MG PO CP24: ORAL_CAPSULE | ORAL | Status: DC

## 2014-06-12 ENCOUNTER — Encounter: Payer: Self-pay | Admitting: Internal Medicine

## 2014-06-13 ENCOUNTER — Other Ambulatory Visit: Payer: Self-pay | Admitting: Internal Medicine

## 2014-06-13 DIAGNOSIS — E049 Nontoxic goiter, unspecified: Secondary | ICD-10-CM

## 2014-06-19 ENCOUNTER — Ambulatory Visit
Admission: RE | Admit: 2014-06-19 | Discharge: 2014-06-19 | Disposition: A | Discharge status: Home / Self Care | Payer: Medicare Other | Source: Ambulatory Visit | Attending: Internal Medicine | Admitting: Internal Medicine

## 2014-06-19 DIAGNOSIS — E049 Nontoxic goiter, unspecified: Secondary | ICD-10-CM

## 2014-07-11 ENCOUNTER — Telehealth: Payer: Self-pay | Admitting: Internal Medicine

## 2014-07-11 NOTE — Telephone Encounter (Signed)
Patient would like to know if it would be ok to schedule a shingle vac?

## 2014-07-11 NOTE — Telephone Encounter (Signed)
yes

## 2014-07-11 NOTE — Telephone Encounter (Signed)
Left vm that patient can call back to schedule.

## 2014-07-17 ENCOUNTER — Ambulatory Visit (INDEPENDENT_AMBULATORY_CARE_PROVIDER_SITE_OTHER): Payer: Medicare Other | Admitting: *Deleted

## 2014-07-17 DIAGNOSIS — Z23 Encounter for immunization: Secondary | ICD-10-CM

## 2014-08-17 ENCOUNTER — Other Ambulatory Visit: Payer: Self-pay

## 2014-09-03 ENCOUNTER — Ambulatory Visit (INDEPENDENT_AMBULATORY_CARE_PROVIDER_SITE_OTHER): Payer: Medicare Other | Admitting: Internal Medicine

## 2014-09-03 ENCOUNTER — Encounter: Payer: Self-pay | Admitting: Internal Medicine

## 2014-09-03 VITALS — BP 140/90 | HR 83 | Temp 98.0°F | Wt 159.2 lb

## 2014-09-03 DIAGNOSIS — M47896 Other spondylosis, lumbar region: Secondary | ICD-10-CM

## 2014-09-03 DIAGNOSIS — M47816 Spondylosis without myelopathy or radiculopathy, lumbar region: Secondary | ICD-10-CM | POA: Insufficient documentation

## 2014-09-03 MED ORDER — GABAPENTIN 100 MG PO CAPS: ORAL_CAPSULE | ORAL | Status: DC

## 2014-09-03 MED ORDER — TRAMADOL HCL 50 MG PO TABS
50.0000 mg | ORAL_TABLET | Freq: Three times a day (TID) | ORAL | Status: DC | PRN
Start: 2014-09-03 — End: 2015-04-09

## 2014-09-03 NOTE — Assessment & Plan Note (Signed)
Tramadol 1/2-1 every 6-8 hours as needed  Gabapentin titrated up to 300 mg every 8 hours  If symptoms persist or progress; MRI of the lumbosacral spine.

## 2014-09-03 NOTE — Progress Notes (Signed)
   Subjective:    Patient ID: Maria [name], female    DOB: 1936/09/15, 78 y.o.   MRN: [id number]  HPI She presents with intermittent low back pain at the level LV-S1 over the last 6 weeks.without trigger or injury This is described as a deep ache up to level IX. When supine she can have aching in her legs without associated numbness, tingling, weakness of the limbs.  The symptoms can last hours.  Leftover tramadol was of some benefit  She had knee surgery February 9th this year. Prior to that time she been having low back pain which resolved postoperatively.  Her last lumbar films revealed advanced degenerative changes.  She does have a history of lung cancer    Review of Systems   She denies fever, chills, sweats, unexplained weight loss  She has no loss control of her bladder or bowels.     Objective:   Physical Exam  Gen.: Healthy and well-nourished in appearance. Alert, appropriate and cooperative throughout exam. Appears younger than stated age  Head: Normocephalic without obvious abnormalities  Eyes: No corneal or conjunctival inflammation noted. Neck: No deformities, masses, or tenderness noted. Range of motion good. Lungs: Normal respiratory effort; chest expands symmetrically. Lungs are clear to auscultation without rales, wheezes, or increased work of breathing. Heart: Normal rate and rhythm. Normal S1 and S2. No gallop, click, or rub. No murmur. Abdomen: Bowel sounds normal; abdomen soft and nontender. No masses, organomegaly or hernias noted. No AAA                              Musculoskeletal/extremities: No deformity or scoliosis noted of  the thoracic or lumbar spine. No pain to percussion of the lumbosacral spine. No pain with compression of the head & neck  while sitting. No clubbing, cyanosis, or edema noted. Range of motion normal .Tone & strength normal. Hand joints reveal  DJD DIP changes.  Fingernail  health good. Able to lie down & sit up w/o help.  Negative SLR bilaterally Vascular: Carotid, radial artery, dorsalis pedis and  posterior tibial pulses are full and equal. No bruits present. Neurologic: Alert and oriented x3. Deep tendon reflexes symmetrical and normal. She is able to lie flat and sit up without help. Straight leg raising is negative to 90. Gait slightly unsteady but able to do  heel & toe walking .  Skin: Intact without suspicious lesions or rashes. Lymph: No cervical, axillary lymphadenopathy present. Psych: Mood and affect are normal. Normally interactive                                                                                          Assessment & Plan:  #1 LBP in the context of advanced degenerative changes. Her symptoms most likely relate to spinal stenosis. #2 PMH of lung cancer  Plan: See orders recommendations

## 2014-09-03 NOTE — Progress Notes (Signed)
Pre visit review using our clinic review tool, if applicable. No additional management support is needed unless otherwise documented below in the visit note. 

## 2014-09-03 NOTE — Patient Instructions (Signed)
Assess response to the gabapentin one every 8 hours as needed. If it is partially beneficial, it can be increased up to a total of 3 pills every 8 hours as needed. This increase of 1 pill each dose  should take place over 72 hours at least.

## 2015-02-26 ENCOUNTER — Ambulatory Visit (INDEPENDENT_AMBULATORY_CARE_PROVIDER_SITE_OTHER)
Admission: RE | Admit: 2015-02-26 | Discharge: 2015-02-26 | Disposition: A | Discharge status: Home / Self Care | Payer: Medicare Other | Source: Ambulatory Visit | Attending: Internal Medicine | Admitting: Internal Medicine

## 2015-02-26 ENCOUNTER — Encounter: Payer: Self-pay | Admitting: Internal Medicine

## 2015-02-26 ENCOUNTER — Ambulatory Visit (INDEPENDENT_AMBULATORY_CARE_PROVIDER_SITE_OTHER): Payer: Medicare Other | Admitting: Internal Medicine

## 2015-02-26 VITALS — BP 112/78 | HR 92 | Temp 97.5°F | Resp 14 | Ht 66.0 in | Wt 163.5 lb

## 2015-02-26 DIAGNOSIS — R05 Cough: Secondary | ICD-10-CM | POA: Diagnosis not present

## 2015-02-26 DIAGNOSIS — R059 Cough, unspecified: Secondary | ICD-10-CM

## 2015-02-26 DIAGNOSIS — J310 Chronic rhinitis: Secondary | ICD-10-CM

## 2015-02-26 DIAGNOSIS — J209 Acute bronchitis, unspecified: Secondary | ICD-10-CM | POA: Diagnosis not present

## 2015-02-26 MED ORDER — BENZONATATE 200 MG PO CAPS: 200.0000 mg | ORAL_CAPSULE | Freq: Three times a day (TID) | ORAL | Status: DC | PRN

## 2015-02-26 MED ORDER — AZITHROMYCIN 250 MG PO TABS: ORAL_TABLET | ORAL | Status: DC

## 2015-02-26 NOTE — Progress Notes (Signed)
Pre visit review using our clinic review tool, if applicable. No additional management support is needed unless otherwise documented below in the visit note. 

## 2015-02-26 NOTE — Patient Instructions (Signed)

## 2015-02-26 NOTE — Progress Notes (Signed)
   Subjective:    Patient ID: Maria [name], female    DOB: 1935-12-07, 79 y.o.   MRN: [id number]  HPI  Her symptoms began one month ago as a nonproductive cough. . Initially this was in the context of traveling [place]country with a woman who had a respiratory tract infection. She now has exertional dyspnea and fatigue. The cough can awaken her at night. She's also describes having some "chest tightness ".  She does describe some head congestion and nasal obstruction. She's had some intermittent frontal headache but not persistently. She also has sneezing without other extrinsic symptoms.  Her past medical history is positive for lung cancer 14 years ago.  Review of Systems She does not have nasal purulence, facial pain, otic pain, otic discharge. The shortness of breath and cough are not associated with wheezing. She does not have significant reflux symptoms. All upper respiratory secretions have been clear. There is no fever, chills, or sweats.     Objective:   Physical Exam  General appearance:Adequately nourished; no acute distress or increased work of breathing is present.    Lymphatic: No  lymphadenopathy about the head, neck, or axilla .  Eyes: No conjunctival inflammation or lid edema is present. There is no scleral icterus.  Ears:  External ear exam shows no significant lesions or deformities.  Otoscopic examination reveals clear canals, tympanic membranes are intact bilaterally without bulging, retraction, inflammation or discharge. TMs slightly dull with decreased light reflex  Nose:  External nasal examination shows no deformity or inflammation. Nasal mucosa are erythematous without lesions or exudates No septal dislocation or deviation.No obstruction to airflow.   Oral exam: Dental hygiene is good; lips and gums are healthy appearing.There is no oropharyngeal erythema or exudate .  Neck:  No deformities, thyromegaly, masses, or tenderness noted.   Supple with full  range of motion without pain.   Heart:  Normal rate and regular rhythm. S1 and S2 normal without gallop, murmur, click, rub or other extra sounds.   Lungs:Chest clear to auscultation; no wheezes, rhonchi,rales ,or rubs present.  Extremities:  No cyanosis, edema, or clubbing  noted    Skin: Warm & dry w/o tenting or jaundice. No significant lesions or rash.       Assessment & Plan:  #1 acute bronchitis w/o bronchospasm #2 non allergic rhinitis Plan: See orders and recommendations

## 2015-02-27 ENCOUNTER — Encounter: Payer: Self-pay | Admitting: Internal Medicine

## 2015-04-08 ENCOUNTER — Other Ambulatory Visit: Payer: Self-pay | Admitting: Internal Medicine

## 2015-04-09 ENCOUNTER — Other Ambulatory Visit: Payer: Self-pay

## 2015-04-09 DIAGNOSIS — M47896 Other spondylosis, lumbar region: Secondary | ICD-10-CM

## 2015-04-09 MED ORDER — TRAMADOL HCL 50 MG PO TABS: 50.0000 mg | ORAL_TABLET | Freq: Three times a day (TID) | ORAL | Status: DC | PRN

## 2015-04-09 NOTE — Telephone Encounter (Signed)
OK X1 

## 2015-04-09 NOTE — Telephone Encounter (Signed)
Please advise, thanks.

## 2015-04-09 NOTE — Telephone Encounter (Signed)
Tramadol rx sent to pharm

## 2015-04-10 ENCOUNTER — Ambulatory Visit (INDEPENDENT_AMBULATORY_CARE_PROVIDER_SITE_OTHER): Payer: Medicare Other | Admitting: Internal Medicine

## 2015-04-10 ENCOUNTER — Encounter: Payer: Self-pay | Admitting: Internal Medicine

## 2015-04-10 VITALS — BP 122/82 | HR 86 | Temp 98.0°F | Resp 16 | Wt 161.0 lb

## 2015-04-10 DIAGNOSIS — B029 Zoster without complications: Secondary | ICD-10-CM | POA: Diagnosis not present

## 2015-04-10 MED ORDER — VALACYCLOVIR HCL 500 MG PO TABS: 500.0000 mg | ORAL_TABLET | Freq: Three times a day (TID) | ORAL | Status: DC

## 2015-04-10 MED ORDER — GABAPENTIN 100 MG PO CAPS: ORAL_CAPSULE | ORAL | Status: DC

## 2015-04-10 NOTE — Progress Notes (Signed)
Pre visit review using our clinic review tool, if applicable. No additional management support is needed unless otherwise documented below in the visit note. 

## 2015-04-10 NOTE — Patient Instructions (Signed)
Assess response to the gabapentin one every 8 hours as needed. If it is partially beneficial, it can be increased up to a total of 3 pills every 8 hours as needed. This increase of 1 pill each dose  should take place over 72 hours at least.If 300 mg is effective dose ; there is a 300 mg pill.

## 2015-04-10 NOTE — Progress Notes (Signed)
   Subjective:    Patient ID: Maria [name], female    DOB: 1936-07-13, 79 y.o.   MRN: [id number]  HPI Her symptoms began 04/07/15 as itching and burning at the right posterior thorax. She noted raised, red lesions proximally 6 cm in length. Burning and itching have persisted but have improved slightly. She has been under increased stress due to family issues.  She has had the zostavax shot.  She has no extrinsic or constitutional symptoms.   Review of Systems  No associated itchy, watery eyes.  Swelling of the lips or tongue or intraoral lesions denied.  Shortness of breath, wheezing, or cough absent.  No vesicles, pustules or urticaria noted.  Fever ,chills , or sweats denied.   Diarrhea not present.  No dysuria, pyuria or hematuria.    Objective:   Physical Exam  General appearance :adequately nourished; in no distress.  Eyes: No conjunctival inflammation or scleral icterus is present. Slight ptosis OD. Oral exam:  Lips and gums are healthy appearing.There is no oropharyngeal erythema or exudate noted. Dental hygiene is good.  Heart:  Normal rate and regular rhythm. S1 and S2 normal without gallop, murmur, click, rub or other extra sounds    Lungs:Chest clear to auscultation; no wheezes, rhonchi,rales ,or rubs present.No increased work of breathing.   Abdomen: bowel sounds normal, soft and non-tender without masses, organomegaly or hernias noted.  No guarding or rebound. No flank tenderness to percussion.  Vascular : all pulses equal ; no bruits present.  Skin:Warm & dry.  Intact with zoster papules @ T-7 ; no tenting or jaundice   Lymphatic: No lymphadenopathy is noted about the head, neck, axilla   Neuro: Strength, tone  normal.        Assessment & Plan:   #1 thoracic zoster  The pathophysiology of herpes zoster infection was discussed. Restrictions were also delineated. Medications prescribed and indications discussed.

## 2015-04-25 ENCOUNTER — Encounter: Payer: Self-pay | Admitting: Internal Medicine

## 2015-04-29 ENCOUNTER — Other Ambulatory Visit: Payer: Self-pay

## 2015-05-23 ENCOUNTER — Other Ambulatory Visit: Payer: Self-pay

## 2015-05-23 DIAGNOSIS — Z1231 Encounter for screening mammogram for malignant neoplasm of breast: Secondary | ICD-10-CM

## 2015-06-18 ENCOUNTER — Ambulatory Visit
Admission: RE | Admit: 2015-06-18 | Discharge: 2015-06-18 | Disposition: A | Discharge status: Home / Self Care | Payer: Medicare Other | Source: Ambulatory Visit

## 2015-06-18 DIAGNOSIS — Z1231 Encounter for screening mammogram for malignant neoplasm of breast: Secondary | ICD-10-CM

## 2015-11-14 ENCOUNTER — Encounter: Payer: Self-pay | Admitting: Behavioral Health

## 2015-11-14 ENCOUNTER — Telehealth: Payer: Self-pay | Admitting: Behavioral Health

## 2015-11-14 NOTE — Telephone Encounter (Signed)
Pre-Visit Call completed with patient and chart updated.   Pre-Visit Info documented in Specialty Comments under [employer].    

## 2015-11-15 ENCOUNTER — Ambulatory Visit (INDEPENDENT_AMBULATORY_CARE_PROVIDER_SITE_OTHER): Payer: Medicare Other | Admitting: Family Medicine

## 2015-11-15 ENCOUNTER — Encounter: Payer: Self-pay | Admitting: Family Medicine

## 2015-11-15 VITALS — BP 130/84 | HR 99 | Temp 98.2°F | Ht 66.0 in | Wt 163.5 lb

## 2015-11-15 DIAGNOSIS — M2550 Pain in unspecified joint: Secondary | ICD-10-CM

## 2015-11-15 DIAGNOSIS — E042 Nontoxic multinodular goiter: Secondary | ICD-10-CM

## 2015-11-15 DIAGNOSIS — E559 Vitamin D deficiency, unspecified: Secondary | ICD-10-CM

## 2015-11-15 DIAGNOSIS — E876 Hypokalemia: Secondary | ICD-10-CM

## 2015-11-15 DIAGNOSIS — D649 Anemia, unspecified: Secondary | ICD-10-CM

## 2015-11-15 DIAGNOSIS — Z23 Encounter for immunization: Secondary | ICD-10-CM

## 2015-11-15 DIAGNOSIS — E782 Mixed hyperlipidemia: Secondary | ICD-10-CM

## 2015-11-15 DIAGNOSIS — E049 Nontoxic goiter, unspecified: Secondary | ICD-10-CM

## 2015-11-15 DIAGNOSIS — E785 Hyperlipidemia, unspecified: Secondary | ICD-10-CM | POA: Diagnosis not present

## 2015-11-15 DIAGNOSIS — M1990 Unspecified osteoarthritis, unspecified site: Secondary | ICD-10-CM

## 2015-11-15 DIAGNOSIS — M47896 Other spondylosis, lumbar region: Secondary | ICD-10-CM

## 2015-11-15 LAB — COMPREHENSIVE METABOLIC PANEL
ALT: 16 U/L (ref 0–35)
AST: 24 U/L (ref 0–37)
Albumin: 4.5 g/dL (ref 3.5–5.2)
Alkaline Phosphatase: 86 U/L (ref 39–117)
BUN: 15 mg/dL (ref 6–23)
CO2: 32 mEq/L (ref 19–32)
Calcium: 9.4 mg/dL (ref 8.4–10.5)
Chloride: 102 mEq/L (ref 96–112)
Creatinine, Ser: 0.73 mg/dL (ref 0.40–1.20)
GFR: 81.96 mL/min (ref >60.00)
Glucose, Bld: 92 mg/dL (ref 70–99)
Potassium: 3.8 mEq/L (ref 3.5–5.1)
Sodium: 141 mEq/L (ref 135–145)
Total Bilirubin: 1 mg/dL (ref 0.2–1.2)
Total Protein: 7.4 g/dL (ref 6.0–8.3)

## 2015-11-15 LAB — CBC
HCT: 45.7 % (ref 36.0–46.0)
Hemoglobin: 15.5 g/dL — ABNORMAL HIGH (ref 12.0–15.0)
MCHC: 33.9 g/dL (ref 30.0–36.0)
MCV: 92 fl (ref 78.0–100.0)
Platelets: 289 10*3/uL (ref 150.0–400.0)
RBC: 4.96 Mil/uL (ref 3.87–5.11)
RDW: 13.5 % (ref 11.5–15.5)
WBC: 8.5 10*3/uL (ref 4.0–10.5)

## 2015-11-15 LAB — LIPID PANEL
Cholesterol: 199 mg/dL (ref 0–200)
HDL: 67 mg/dL (ref >39.00)
LDL Cholesterol: 114 mg/dL — ABNORMAL HIGH (ref 0–99)
NonHDL: 132.41
Total CHOL/HDL Ratio: 3
Triglycerides: 94 mg/dL (ref 0.0–149.0)
VLDL: 18.8 mg/dL (ref 0.0–40.0)

## 2015-11-15 LAB — TSH: TSH: 1.26 u[IU]/mL (ref 0.35–4.50)

## 2015-11-15 LAB — VITAMIN D 25 HYDROXY (VIT D DEFICIENCY, FRACTURES): VITD: 30.13 ng/mL (ref 30.00–100.00)

## 2015-11-15 NOTE — Patient Instructions (Signed)
Osteoporosis Osteoporosis is the thinning and loss of density in the bones. Osteoporosis makes the bones more brittle, fragile, and likely to break (fracture). Over time, osteoporosis can cause the bones to become so weak that they fracture after a simple fall. The bones most likely to fracture are the bones in the hip, wrist, and spine. CAUSES  The exact cause is not known. RISK FACTORS Anyone can develop osteoporosis. You may be at greater risk if you have a family history of the condition or have poor nutrition. You may also have a higher risk if you are:   Female.   15 years old or older.  A smoker.  Not physically active.   White or Asian.  Slender. SIGNS AND SYMPTOMS  A fracture might be the first sign of the disease, especially if it results from a fall or injury that would not usually cause a bone to break. Other signs and symptoms include:   Low back and neck pain.  Stooped posture.  Height loss. DIAGNOSIS  To make a diagnosis, your health care provider may:  Take a medical history.  Perform a physical exam.  Order tests, such as:  A bone mineral density test.  A dual-energy X-ray absorptiometry test. TREATMENT  The goal of osteoporosis treatment is to strengthen your bones to reduce your risk of a fracture. Treatment may involve:  Making lifestyle changes, such as:  Eating a diet rich in calcium.  Doing weight-bearing and muscle-strengthening exercises.  Stopping tobacco use.  Limiting alcohol intake.  Taking medicine to slow the process of bone loss or to increase bone density.  Monitoring your levels of calcium and vitamin D. HOME CARE INSTRUCTIONS  Include calcium and vitamin D in your diet. Calcium is important for bone health, and vitamin D helps the body absorb calcium.  Perform weight-bearing and muscle-strengthening exercises as directed by your health care provider.  Do not use any tobacco products, including cigarettes, chewing  tobacco, and electronic cigarettes. If you need help quitting, ask your health care provider.  Limit your alcohol intake.  Take medicines only as directed by your health care provider.  Keep all follow-up visits as directed by your health care provider. This is important.  Take precautions at home to lower your risk of falling, such as:  Keeping rooms well lit and clutter free.  Installing safety rails on stairs.  Using rubber mats in the bathroom and other areas that are often wet or slippery. SEEK IMMEDIATE MEDICAL CARE IF:  You fall or injure yourself.    This information is not intended to replace advice given to you by your health care provider. Make sure you discuss any questions you have with your health care provider.   Document Released: 07/29/2005 Document Revised: 11/09/2014 Document Reviewed: 03/29/2014 Elsevier Interactive Patient Education [employer].

## 2015-11-15 NOTE — Progress Notes (Signed)
Pre visit review using our clinic review tool, if applicable. No additional management support is needed unless otherwise documented below in the visit note. 

## 2015-11-15 NOTE — Assessment & Plan Note (Signed)
Diffuse joint manages with movement, Advil, and chiropractic. Consider Salon Pas patches or gel

## 2015-11-15 NOTE — Progress Notes (Signed)
Patient ID: Maria [name], female   DOB: 1936-08-09, 80 y.o.   MRN: [id number]   Subjective:    Patient ID: Maria [name], female    DOB: 12/18/1935, 80 y.o.   MRN: [id number]  Chief Complaint  Patient presents with  . Establish Care    HPI Patient is in today for evaluation as a new patient. Her primary doctor has retired. She has a past medical history that includes thyroid nodules, arthritis with back pain and knee pain. She also has a history of osteopenia, vitamin D deficiency, mitral valve prolapse and non-small cell lung cancer diagnosed in 2002. She feels well today. No recent illness or acute concern. Denies CP/palp/SOB/HA/congestion/fevers/GI or GU c/o. Taking meds as prescribed  Past Medical History  Diagnosis Date  . Diverticulosis   . Goiter, nodular     [place] of thyroid stable 7/08  . Osteopenia     Dr Pamala Hurry  . Vitamin D deficiency   . Mitral valve prolapse   . Heart murmur     due to rheumatic fever as child  . Complication of anesthesia     slow to awaken  . PONV (postoperative nausea and vomiting)     n/v  . Neoplasm of lung, malignant ([place]) 2002    Non-small cell  . Osteoarthritis 11/14/2013    2010 intra-articular steroids X 3 S/P Physical Therapy in 12/14 TKR 12/11/13   . Multiple thyroid nodules 11/24/2015    Past Surgical History  Procedure Laterality Date  . Lobectomy  12/02    RU; no radiation or chemo, Dr. Arlyce Dice  . Colonoscopy  2009 , 2014    Diverticulosis; Dr. Olevia Perches  . Tonsillectomy    . Breast biopsy Right   . Abdominal hysterectomy  1998    No BSO, for dysfunctional menses  . Total knee arthroplasty Right 12/11/2013    Procedure: RIGHT TOTAL KNEE ARTHROPLASTY;  Surgeon: Gearlean Alf, MD;  Location: [facility];  Service: Orthopedics;  Laterality: Right;  . Bladder suspension Bilateral     Family History  Problem Relation Age of Onset  . COPD Mother   . Coronary artery disease Father   . Colon cancer Father 42  . Heart attack Father  75  . Stroke Father     > 86  . Lung cancer Maternal Uncle     smoker  . Lung cancer Paternal Aunt     smoker  . Cancer Paternal Aunt     lung  . Lung cancer Maternal Grandfather     smoker  . COPD Maternal Grandfather   . Diabetes Neg Hx   . Arthritis Sister     Social History   Social History  . Marital Status: Divorced    Spouse Name: N/A  . Number of Children: N/A  . Years of Education: N/A   Occupational History  . Retired    Social History Main Topics  . Smoking status: Former Smoker -- 20 years    Types: Cigarettes    Quit date: 05/03/1979  . Smokeless tobacco: Never Used     Comment: smoked 1962-1980, up to < 1 ppd  . Alcohol Use: 3.5 oz/week    7 Standard drinks or equivalent per week     Comment: occasional  . Drug Use: No  . Sexual Activity: Not on file     Comment: lives alone, dog, no dietary restrictions, eating heart healthy   Other Topics Concern  . Not on file   Social History  Narrative   Pt gets regular exercise.    Outpatient Prescriptions Prior to Visit  Medication Sig Dispense Refill  . psyllium (METAMUCIL) 58.6 % powder Take 1 packet by mouth daily. 1 teaspoon    . Ibuprofen (ADVIL PO) Take by mouth. Reported on 11/15/2015    . traMADol (ULTRAM) 50 MG tablet Take 1 tablet (50 mg total) by mouth every 8 (eight) hours as needed. (Patient not taking: Reported on 11/15/2015) 30 tablet 1   No facility-administered medications prior to visit.    Allergies  Allergen Reactions  . Morphine And Related     Mental status change  . Oxycodone     Mental status changes post op 12/11/13  . Sulfonamide Derivatives     REACTION: stomach pain    Review of Systems  Constitutional: Negative for fever, chills and malaise/fatigue.  HENT: Negative for congestion and hearing loss.   Eyes: Negative for discharge.  Respiratory: Negative for cough, sputum production and shortness of breath.   Cardiovascular: Negative for chest pain, palpitations and leg  swelling.  Gastrointestinal: Negative for heartburn, nausea, vomiting, abdominal pain, diarrhea, constipation and blood in stool.  Genitourinary: Negative for dysuria, urgency, frequency and hematuria.  Musculoskeletal: Positive for back pain and joint pain. Negative for myalgias and falls.  Skin: Negative for rash.  Neurological: Negative for dizziness, sensory change, loss of consciousness, weakness and headaches.  Endo/Heme/Allergies: Negative for environmental allergies. Does not bruise/bleed easily.  Psychiatric/Behavioral: Negative for depression and suicidal ideas. The patient is not nervous/anxious and does not have insomnia.        Objective:    Physical Exam  Constitutional: She is oriented to person, place, and time. She appears well-developed and well-nourished. No distress.  HENT:  Head: Normocephalic and atraumatic.  Nose: Nose normal.  Eyes: Right eye exhibits no discharge. Left eye exhibits no discharge.  Neck: Normal range of motion. Neck supple.  Cardiovascular: Normal rate and regular rhythm.   No murmur heard. Pulmonary/Chest: Effort normal and breath sounds normal.  Abdominal: Soft. Bowel sounds are normal. There is no tenderness.  Genitourinary: No vaginal discharge found.  Musculoskeletal: She exhibits no edema.  Neurological: She is alert and oriented to person, place, and time.  Skin: Skin is warm and dry.  Psychiatric: She has a normal mood and affect.  Nursing note and vitals reviewed.   BP 130/84 mmHg  Pulse 99  Temp(Src) 98.2 F (36.8 C) (Oral)  Ht '5\' 6"'$  (1.676 m)  Wt 163 lb 8 oz (74.163 kg)  BMI 26.40 kg/m2  SpO2 97% Wt Readings from Last 3 Encounters:  11/15/15 163 lb 8 oz (74.163 kg)  04/10/15 161 lb (73.029 kg)  02/26/15 163 lb 8 oz (74.163 kg)     Lab Results  Component Value Date   WBC 8.5 11/15/2015   HGB 15.5* 11/15/2015   HCT 45.7 11/15/2015   PLT 289.0 11/15/2015   GLUCOSE 92 11/15/2015   CHOL 199 11/15/2015   TRIG 94.0  11/15/2015   HDL 67.00 11/15/2015   LDLDIRECT 137.2 10/23/2008   LDLCALC 114* 11/15/2015   ALT 16 11/15/2015   AST 24 11/15/2015   NA 141 11/15/2015   K 3.8 11/15/2015   CL 102 11/15/2015   CREATININE 0.73 11/15/2015   BUN 15 11/15/2015   CO2 32 11/15/2015   TSH 1.26 11/15/2015   INR 0.91 12/04/2013   HGBA1C 5.6 02/13/2014    Lab Results  Component Value Date   TSH 1.26 11/15/2015   Lab  Results  Component Value Date   WBC 8.5 11/15/2015   HGB 15.5* 11/15/2015   HCT 45.7 11/15/2015   MCV 92.0 11/15/2015   PLT 289.0 11/15/2015   Lab Results  Component Value Date   NA 141 11/15/2015   K 3.8 11/15/2015   CO2 32 11/15/2015   GLUCOSE 92 11/15/2015   BUN 15 11/15/2015   CREATININE 0.73 11/15/2015   BILITOT 1.0 11/15/2015   ALKPHOS 86 11/15/2015   AST 24 11/15/2015   ALT 16 11/15/2015   PROT 7.4 11/15/2015   ALBUMIN 4.5 11/15/2015   CALCIUM 9.4 11/15/2015   GFR 81.96 11/15/2015   Lab Results  Component Value Date   CHOL 199 11/15/2015   Lab Results  Component Value Date   HDL 67.00 11/15/2015   Lab Results  Component Value Date   LDLCALC 114* 11/15/2015   Lab Results  Component Value Date   TRIG 94.0 11/15/2015   Lab Results  Component Value Date   CHOLHDL 3 11/15/2015   Lab Results  Component Value Date   HGBA1C 5.6 02/13/2014       Assessment & Plan:   Problem List Items Addressed This Visit    Degenerative arthritis of lumbar spine    Follows with Estill Batten of chiropractic and stays active. Encouraged moist heat and gentle stretching as tolerated. May try NSAIDs and prescription meds as directed and report if symptoms worsen or seek immediate care      Hyperlipidemia, mixed    Mild, Encouraged heart healthy diet, increase exercise, avoid trans fats, consider a krill oil cap daily      Hypokalemia   Relevant Orders   Comprehensive metabolic panel (Completed)   Lipid panel (Completed)   Multiple thyroid nodules    Will monitor        Osteoarthritis    Diffuse joint manages with movement, Advil, and chiropractic. Consider Salon Pas patches or gel      POLYARTHRALGIA    Is following with ortho, Dr Maureen Ralphs, has responded to shots.       Vitamin D deficiency - Primary    Low normal ranged encouraged to take Vitamin d 2000 IU dialy      Relevant Orders   VITAMIN D 25 Hydroxy (Vit-D Deficiency, Fractures) (Completed)    Other Visit Diagnoses    Hyperlipemia        Relevant Orders    Lipid panel (Completed)    Goiter, nodular        Relevant Orders    TSH (Completed)    Anemia, unspecified anemia type        Relevant Orders    CBC (Completed)    Need for prophylactic vaccination against Streptococcus pneumoniae (pneumococcus)        Relevant Orders    Pneumococcal conjugate vaccine 13-valent IM (Completed)       I am having Ms. Nerio maintain her psyllium, Ibuprofen (ADVIL PO), and traMADol.  No orders of the defined types were placed in this encounter.     Willette Alma, MD

## 2015-11-24 ENCOUNTER — Encounter: Payer: Self-pay | Admitting: Family Medicine

## 2015-11-24 DIAGNOSIS — E042 Nontoxic multinodular goiter: Secondary | ICD-10-CM

## 2015-11-24 HISTORY — DX: Nontoxic multinodular goiter: E04.2

## 2015-11-24 NOTE — Assessment & Plan Note (Signed)
Will monitor

## 2015-11-24 NOTE — Assessment & Plan Note (Signed)
Low normal ranged encouraged to take Vitamin d 2000 IU dialy

## 2015-11-24 NOTE — Assessment & Plan Note (Signed)
Is following with ortho, Dr Maureen Ralphs, has responded to shots.

## 2015-11-24 NOTE — Assessment & Plan Note (Signed)
Mild, Encouraged heart healthy diet, increase exercise, avoid trans fats, consider a krill oil cap daily

## 2015-11-24 NOTE — Assessment & Plan Note (Signed)
Follows with Maria [name] of chiropractic and stays active. Encouraged moist heat and gentle stretching as tolerated. May try NSAIDs and prescription meds as directed and report if symptoms worsen or seek immediate care

## 2016-01-31 ENCOUNTER — Other Ambulatory Visit: Payer: Self-pay | Admitting: Internal Medicine

## 2016-01-31 ENCOUNTER — Other Ambulatory Visit: Payer: Self-pay | Admitting: Family Medicine

## 2016-01-31 ENCOUNTER — Encounter: Payer: Self-pay | Admitting: Family Medicine

## 2016-01-31 DIAGNOSIS — M47896 Other spondylosis, lumbar region: Secondary | ICD-10-CM

## 2016-01-31 MED ORDER — TRAMADOL HCL 50 MG PO TABS: 50.0000 mg | ORAL_TABLET | Freq: Three times a day (TID) | ORAL | Status: DC | PRN

## 2016-01-31 NOTE — Telephone Encounter (Signed)
Refill for Tramadol phoned in per PCP instructions and mychart request.

## 2016-04-02 ENCOUNTER — Encounter: Payer: Self-pay | Admitting: Family Medicine

## 2016-04-15 ENCOUNTER — Ambulatory Visit (INDEPENDENT_AMBULATORY_CARE_PROVIDER_SITE_OTHER): Payer: Medicare Other | Admitting: Family Medicine

## 2016-04-15 ENCOUNTER — Encounter: Payer: Self-pay | Admitting: Family Medicine

## 2016-04-15 VITALS — BP 137/87 | HR 84 | Temp 98.0°F | Ht 66.0 in | Wt 162.0 lb

## 2016-04-15 DIAGNOSIS — R030 Elevated blood-pressure reading, without diagnosis of hypertension: Secondary | ICD-10-CM

## 2016-04-15 DIAGNOSIS — F329 Major depressive disorder, single episode, unspecified: Secondary | ICD-10-CM

## 2016-04-15 DIAGNOSIS — F32A Depression, unspecified: Secondary | ICD-10-CM

## 2016-04-15 DIAGNOSIS — G478 Other sleep disorders: Secondary | ICD-10-CM

## 2016-04-15 DIAGNOSIS — IMO0001 Reserved for inherently not codable concepts without codable children: Secondary | ICD-10-CM

## 2016-04-15 MED ORDER — DULOXETINE HCL 30 MG PO CPEP: 30.0000 mg | ORAL_CAPSULE | Freq: Every day | ORAL | Status: DC

## 2016-04-15 NOTE — Progress Notes (Signed)
Pre visit review using our clinic review tool, if applicable. No additional management support is needed unless otherwise documented below in the visit note. 

## 2016-04-15 NOTE — Patient Instructions (Signed)
Please keep an eye on your blood pressure at home; if you are consistently running higher than 140/90 please let me know We will arrange for you to have a sleep study to evaluate for sleep apnea Start back on the cymbalta at 30 mg once a day for 2 weeks.  You can then increase to 2 pills a day if needed Let me know if this is not helping, if your depression is getting worse, etc

## 2016-04-15 NOTE — Progress Notes (Signed)
[facility] [address] [place], [place] [address] [id number] [phone]  Date:  04/15/2016   Name:  Maria [name]   DOB:  1935-12-30   MRN:  [id number]  PCP:  Penni Homans, MD    Chief Complaint: Hypertension   History of Present Illness:  Maria [name] is a 80 y.o. very pleasant female patient who presents with the following:  History of lung cancer with lobectomy in 2002.  She is not on any medication for BP She is a pt of Dr. Charlett Blake-  just started seeing her as Dr. Linna Darner retired.  About 10 days her BP was 160/100 at chiropractor. The next day [facility]; it was 152/90, then 150/84 on recheck  Her father did have HTN but he died quite young of various health complications  She notes that she has not felt herself in a few months- she has felt tired out, feels "queasy" a lot but no vomiting No weight change No cough or fevers She did move from [place] about 3 years ago to be closer to family but she misses it a lot. She would like to live there part time and hopes to get a condo  She does think that she may snore or stop breathing while she is asleep at night Never did a sleep study but would like to do so  She is also feeling sad and down, notes that she can cry easily.  She has noted this for 6 weeks or so. She did have some depression about 3 years ago following her move here.  She used cymbalta that helped her a lot and she was able to stop taking it a few years ago   She is able to go to sleep but does not feel like she sleeps very well- she is still tired in the am She does feel anxious as well- this is not normal for her She is under a lot of family stressors- "but there is not anything I can do about it,  I'm working very hard to love everyone and let it be."    BP Readings from Last 3 Encounters:  04/15/16 134/91  11/15/15 130/84  04/10/15 122/82     Patient Active Problem  List   Diagnosis Date Noted  . Multiple thyroid nodules 11/24/2015  . Degenerative arthritis of lumbar spine 09/03/2014  . Hyponatremia 12/12/2013  . Hypokalemia 12/12/2013  . Osteoarthritis 11/14/2013  . BENIGN POSITIONAL VERTIGO 01/17/2010  . Hyperlipidemia, mixed 02/04/2009  . Vitamin D deficiency 10/23/2008  . POLYARTHRALGIA 10/23/2008  . Osteoporosis 10/23/2008  . NEOPLASM, MALIGNANT, LUNG, NON-SMALL CELL 11/21/2007  . DIVERTICULOSIS 06/15/2007  . GOITER, NODULAR 04/14/2007    Past Medical History  Diagnosis Date  . Diverticulosis   . Goiter, nodular     [place] of thyroid stable 7/08  . Osteopenia     Dr Pamala Hurry  . Vitamin D deficiency   . Mitral valve prolapse   . Heart murmur     due to rheumatic fever as child  . Complication of anesthesia     slow to awaken  . PONV (postoperative nausea and vomiting)     n/v  . Neoplasm of lung, malignant ([place]) 2002    Non-small cell  . Osteoarthritis 11/14/2013    2010 intra-articular steroids X 3 S/P Physical Therapy in 12/14 TKR 12/11/13   . Multiple thyroid nodules 11/24/2015  Past Surgical History  Procedure Laterality Date  . Lobectomy  12/02    RU; no radiation or chemo, Dr. Arlyce Dice  . Colonoscopy  2009 , 2014    Diverticulosis; Dr. Olevia Perches  . Tonsillectomy    . Breast biopsy Right   . Abdominal hysterectomy  1998    No BSO, for dysfunctional menses  . Total knee arthroplasty Right 12/11/2013    Procedure: RIGHT TOTAL KNEE ARTHROPLASTY;  Surgeon: Gearlean Alf, MD;  Location: [facility];  Service: Orthopedics;  Laterality: Right;  . Bladder suspension Bilateral     Social History  Substance Use Topics  . Smoking status: Former Smoker -- 20 years    Types: Cigarettes    Quit date: 05/03/1979  . Smokeless tobacco: Never Used     Comment: smoked 1962-1980, up to < 1 ppd  . Alcohol Use: 3.5 oz/week    7 Standard drinks or equivalent per week     Comment: occasional    Family History  Problem Relation Age of Onset   . COPD Mother   . Coronary artery disease Father   . Colon cancer Father 46  . Heart attack Father 70  . Stroke Father     > 62  . Lung cancer Maternal Uncle     smoker  . Lung cancer Paternal Aunt     smoker  . Cancer Paternal Aunt     lung  . Lung cancer Maternal Grandfather     smoker  . COPD Maternal Grandfather   . Diabetes Neg Hx   . Arthritis Sister     Allergies  Allergen Reactions  . Morphine And Related     Mental status change  . Oxycodone     Mental status changes post op 12/11/13  . Sulfonamide Derivatives     REACTION: stomach pain    Medication list has been reviewed and updated.  Current Outpatient Prescriptions on File Prior to Visit  Medication Sig Dispense Refill  . Ibuprofen (ADVIL PO) Take 1 tablet by mouth 2 (two) times daily. Reported on 11/15/2015    . psyllium (METAMUCIL) 58.6 % powder Take 1 packet by mouth daily. 1 teaspoon    . traMADol (ULTRAM) 50 MG tablet Take 1 tablet (50 mg total) by mouth every 8 (eight) hours as needed. 30 tablet 1   No current facility-administered medications on file prior to visit.    Review of Systems:  As per HPI- otherwise negative. Denies any SI- "I have too much I want to do, I would never hurt myself"   Physical Examination: Filed Vitals:   04/15/16 1037  BP: 134/91  Pulse: 84  Temp: 98 F (36.7 C)   Filed Vitals:   04/15/16 1037  Height: '5\' 6"'$  (1.676 m)  Weight: 162 lb (73.483 kg)   Body mass index is 26.16 kg/(m^2). Ideal Body Weight: Weight in (lb) to have BMI = 25: 154.6  GEN: WDWN, NAD, Non-toxic, A & O x 3, mild overweight, looks well HEENT: Atraumatic, Normocephalic. Neck supple. No masses, No LAD. Ears and Nose: No external deformity. CV: RRR, No M/G/R. No JVD. No thrill. No extra heart sounds. PULM: CTA B, no wheezes, crackles, rhonchi. No retractions. No resp. distress. No accessory muscle use. ABD: S, NT, ND, +BS. No rebound. No HSM. EXTR: No c/c/e NEURO Normal gait.  PSYCH:  Normally interactive. Conversant. Not depressed or anxious appearing.  Calm demeanor.    Assessment and Plan: Elevated BP  Poor sleep pattern - Plan:  Ambulatory referral to Sleep Studies  Depression - Plan: DULoxetine (CYMBALTA) 30 MG capsule  She plans to purchase a BP cuff and will monitor her readings at home. appt with Dr. Charlett Blake next month  Please keep an eye on your blood pressure at home; if you are consistently running higher than 140/90 please let me know We will arrange for you to have a sleep study to evaluate for sleep apnea Start back on the cymbalta at 30 mg once a day for 2 weeks.  You can then increase to 2 pills a day if needed Let me know if this is not helping, if your depression is getting worse, etc   Signed Lamar Blinks, MD

## 2016-04-22 ENCOUNTER — Telehealth: Payer: Self-pay | Admitting: Family Medicine

## 2016-04-22 NOTE — Telephone Encounter (Signed)
Pt says that provider requested that she call in to update her on her BP. Pt says that her bottom number has been 90 for about 7 days. Pt says that she's not sure of why but she's just following orders. Pt says that she just want to make provider aware.   CB: [phone]

## 2016-04-25 NOTE — Telephone Encounter (Signed)
Called her back- her average number is running approx 137/88.  This is fine, she will keep [place] posted if any changes

## 2016-04-28 ENCOUNTER — Encounter: Payer: Self-pay | Admitting: Neurology

## 2016-04-28 ENCOUNTER — Ambulatory Visit (INDEPENDENT_AMBULATORY_CARE_PROVIDER_SITE_OTHER): Payer: Medicare Other | Admitting: Neurology

## 2016-04-28 VITALS — BP 138/84 | HR 80 | Resp 16 | Ht 66.0 in | Wt 163.0 lb

## 2016-04-28 DIAGNOSIS — R0683 Snoring: Secondary | ICD-10-CM

## 2016-04-28 DIAGNOSIS — R51 Headache: Secondary | ICD-10-CM | POA: Diagnosis not present

## 2016-04-28 DIAGNOSIS — R351 Nocturia: Secondary | ICD-10-CM | POA: Diagnosis not present

## 2016-04-28 DIAGNOSIS — G4710 Hypersomnia, unspecified: Secondary | ICD-10-CM | POA: Diagnosis not present

## 2016-04-28 DIAGNOSIS — R519 Headache, unspecified: Secondary | ICD-10-CM

## 2016-04-28 NOTE — Progress Notes (Signed)
Subjective:    Patient ID: Maria [name] is a 80 y.o. female.  HPI     Star Age, MD, PhD [facility] [address], [address].O. [place], [place] [address]  Dear Dr. Lorelei Pont,   I saw your patient, Maria [name], upon your kind request in my neurologic clinic today for initial consultation of her sleep disturbance, in particular sleep disruption and daytime somnolence. The patient is unaccompanied today. As you know, Maria [name] is a 80 year old right-handed lady with an underlying medical history non-small cell lung cancer, status post lobectomy in 2002, hypertension, depression, thyroid nodules, arthritis, hyponatremia, hypokalemia, hyperlipidemia, vitamin D deficiency, osteoporosis, and overweight state, who reports Snoring, excessive daytime somnolence, nonrestorative sleep, morning headaches and sleep disruption. I reviewed your office note from 04/15/2016. She is s/p R TKA in 2015 and has L knee pain, s/p multiple knee injection, sees Dr. Maureen Ralphs.  She goes to bed around 9:30 to 10 PM. She has occasional morning headaches, not enough to take medication, but BP has been a little up in the AMs. She denies restless leg symptoms. She has a rise time around 5 AM. She goes to the bathroom once or twice per average night. She lives alone. She is divorced for about 41 years. She is retired from [employer]. She has 2 grown children and 6 grandchildren. She likes to travel. She stays very active, walks about 2 miles per day. She drinks alcohol occasionally, usually 2 cups of coffee per day, quit smoking in 1980. She recently started Cymbalta 30 mg once daily about 2 weeks ago and feels improved in her mood. She has no TV in the bedroom. She has a small dog sleeps at the end of the bed in a queen size bed. She is not aware of any family history of obstructive sleep apnea. Her main concern is that she is tired during the day and does not wake up rested. Her  Epworth sleepiness score is 12 out of 24 today, her fatigue score is 33 out of 63.  Her Past Medical History Is Significant For: Past Medical History  Diagnosis Date  . Diverticulosis   . Goiter, nodular     [place] of thyroid stable 7/08  . Osteopenia     Dr Pamala Hurry  . Vitamin D deficiency   . Mitral valve prolapse   . Heart murmur     due to rheumatic fever as child  . Complication of anesthesia     slow to awaken  . PONV (postoperative nausea and vomiting)     n/v  . Neoplasm of lung, malignant ([place]) 2002    Non-small cell  . Osteoarthritis 11/14/2013    2010 intra-articular steroids X 3 S/P Physical Therapy in 12/14 TKR 12/11/13   . Multiple thyroid nodules 11/24/2015  . Depression     Her Past Surgical History Is Significant For: Past Surgical History  Procedure Laterality Date  . Lobectomy  12/02    RU; no radiation or chemo, Dr. Arlyce Dice  . Colonoscopy  2009 , 2014    Diverticulosis; Dr. Olevia Perches  . Tonsillectomy    . Breast biopsy Right   . Abdominal hysterectomy  1998    No BSO, for dysfunctional menses  . Total knee arthroplasty Right 12/11/2013    Procedure: RIGHT TOTAL KNEE ARTHROPLASTY;  Surgeon: Gearlean Alf, MD;  Location: [facility];  Service: Orthopedics;  Laterality: Right;  . Bladder suspension Bilateral     Her Family History Is  Significant For: Family History  Problem Relation Age of Onset  . COPD Mother   . Coronary artery disease Father   . Colon cancer Father 55  . Heart attack Father 21  . Stroke Father     > 25  . Lung cancer Maternal Uncle     smoker  . Lung cancer Paternal Aunt     smoker  . Cancer Paternal Aunt     lung  . Lung cancer Maternal Grandfather     smoker  . COPD Maternal Grandfather   . Diabetes Neg Hx   . Arthritis Sister     Her Social History Is Significant For: Social History   Social History  . Marital Status: Divorced    Spouse Name: N/A  . Number of Children: N/A  . Years of Education: college   Occupational  History  . Retired    Social History Main Topics  . Smoking status: Former Smoker -- 20 years    Types: Cigarettes    Quit date: 05/03/1979  . Smokeless tobacco: Never Used     Comment: smoked 1962-1980, up to < 1 ppd  . Alcohol Use: 4.2 oz/week    7 Standard drinks or equivalent per week     Comment: occasional  . Drug Use: No  . Sexual Activity: Not Asked     Comment: lives alone, dog, no dietary restrictions, eating heart healthy   Other Topics Concern  . None   Social History Narrative   Pt gets regular exercise.   Drinks 2 cups of coffee a day     Her Allergies Are:  Allergies  Allergen Reactions  . Morphine And Related     Mental status change  . Oxycodone     Mental status changes post op 12/11/13  . Sulfonamide Derivatives     REACTION: stomach pain  :   Her Current Medications Are:  Outpatient Encounter Prescriptions as of 04/28/2016  Medication Sig  . Calcium Carb-Cholecalciferol (CALCIUM 500 +D PO) Take by mouth.  . DULoxetine (CYMBALTA) 30 MG capsule Take 1 capsule (30 mg total) by mouth daily. Increase to 2 pills daily after 2 weeks  . Ibuprofen (ADVIL PO) Take 1 tablet by mouth 2 (two) times daily. Reported on 11/15/2015  . Omega-3 Fatty Acids (FISH OIL PO) Take by mouth.  . psyllium (METAMUCIL) 58.6 % powder Take 1 packet by mouth daily. 1 teaspoon  . traMADol (ULTRAM) 50 MG tablet Take 1 tablet (50 mg total) by mouth every 8 (eight) hours as needed.   No facility-administered encounter medications on file as of 04/28/2016.  :  Review of Systems:  Out of a complete 14 point review of systems, all are reviewed and negative with the exception of these symptoms as listed below:   Review of Systems  Constitutional: Positive for fatigue.  Musculoskeletal:       Joint pain   Neurological:       No trouble falling and staying asleep, snoring, wakes up feeling tired, morning headaches, daytime tiredness, takes naps during day.   Psychiatric/Behavioral:        Depression, decreased energy    Epworth Sleepiness Scale 0= would never doze 1= slight chance of dozing 2= moderate chance of dozing 3= high chance of dozing  Sitting and reading: 3 Watching TV:3 Sitting inactive in a public place (ex. Theater or meeting):0 As a passenger in a car for an hour without a break:0 Lying down to rest in the afternoon: 3 Sitting  and talking to someone:0 Sitting quietly after lunch (no alcohol):3 In a car, while stopped in traffic:0 Total:12  Objective:  Neurologic Exam  Physical Exam Physical Examination:   Filed Vitals:   04/28/16 0855  BP: 138/84  Pulse: 80  Resp: 16    General Examination: The patient is a very pleasant 80 y.o. female in no acute distress. she appears well-developed and well-nourished and very well groomed.   HEENT: Normocephalic, atraumatic, pupils are equal, round and reactive to light and accommodation. Funduscopic exam is normal with sharp disc margins noted. Extraocular tracking is good without limitation to gaze excursion or nystagmus noted. Normal smooth pursuit is noted. Hearing is grossly intact. Tympanic membranes are clear bilaterally. Face is symmetric with normal facial animation and normal facial sensation. Speech is clear with no dysarthria noted. There is no hypophonia. There is no lip, neck/head, jaw or voice tremor. Neck is supple with full range of passive and active motion. There are no carotid bruits on auscultation. Oropharynx exam reveals: mild mouth dryness, adequate dental hygiene and moderate airway crowding, due to redundant soft palate and larger uvula. Mallampati is class II. Tongue protrudes centrally and palate elevates symmetrically. Tonsils are absent. Neck size is 14 inches. She has a Mild overbite.   Chest: Clear to auscultation without wheezing, rhonchi or crackles noted.  Heart: S1+S2+0, regular and normal without murmurs, rubs or gallops noted.   Abdomen: Soft, non-tender and non-distended  with normal bowel sounds appreciated on auscultation.  Extremities: There is no pitting edema in the distal lower extremities bilaterally. Pedal pulses are intact.  Skin: Warm and dry without trophic changes noted. There are no varicose veins.  Musculoskeletal: exam reveals prominent joint deformities in her hands in keeping with osteoarthritis, she has left knee pain and decrease in range of motion on the left. She has low back pain.   Neurologically:  Mental status: The patient is awake, alert and oriented in all 4 spheres. Her immediate and remote memory, attention, language skills and fund of knowledge are appropriate. There is no evidence of aphasia, agnosia, apraxia or anomia. Speech is clear with normal prosody and enunciation. Thought process is linear. Mood is normal and affect is normal.  Cranial nerves II - XII are as described above under HEENT exam. In addition: shoulder shrug is normal with equal shoulder height noted. Motor exam: Normal bulk, strength and tone is noted. There is no drift, tremor or rebound. Romberg is negative. Reflexes are 1+ throughout. Babinski: Toes are flexor bilaterally. Fine motor skills and coordination: intact with normal finger taps, normal hand movements, normal rapid alternating patting, normal foot taps and normal foot agility.  Cerebellar testing: No dysmetria or intention tremor on finger to nose testing. Heel to shin is unremarkable bilaterally, mildly difficult on the left due to knee pain . There is no truncal or gait ataxia.  Sensory exam: intact to light touch, pinprick, vibration, temperature sense in the upper and lower extremities.  Gait, station and balance: She stands with difficulty. No veering to one side is noted. No leaning to one side is noted. Posture  shows increase in lumbar kyphosis. She has low back pain, nonradiating. She has a limp on the left. She walks slowly and cautiously. Tandem walk is not possible for her.    Assessment and  Plan:  In summary, Maria [name] is a very pleasant 80 y.o.-year old female with an underlying medical history non-small cell lung cancer, status post lobectomy in 2002, hypertension, depression,  thyroid nodules, arthritis, hyponatremia, hypokalemia, hyperlipidemia, vitamin D deficiency, osteoporosis, and overweight state, whose history and physical exam are concerning for obstructive sleep apnea (OSA). I had a long chat with the patient about my findings and the diagnosis of OSA, its prognosis and treatment options. We talked about medical treatments, surgical interventions and non-pharmacological approaches. I explained in particular the risks and ramifications of untreated moderate to severe OSA, especially with respect to developing cardiovascular disease down the Road, including congestive heart failure, difficult to treat hypertension, cardiac arrhythmias, or stroke. Even type 2 diabetes has, in part, been linked to untreated OSA. Symptoms of untreated OSA include daytime sleepiness, memory problems, mood irritability and mood disorder such as depression and anxiety, lack of energy, as well as recurrent headaches, especially morning headaches. We talked about trying to maintain a healthy lifestyle in general, as well as the importance of weight control. I encouraged the patient to eat healthy, exercise daily and keep well hydrated, to keep a scheduled bedtime and wake time routine, to not skip any meals and eat healthy snacks in between meals. I advised the patient not to drive when feeling sleepy. I recommended the following at this time: sleep study with potential positive airway pressure titration. (We will score hypopneas at 4% and split the sleep study into diagnostic and treatment portion, if the estimated. 2 hour AHI is >20/h).   I explained the sleep test procedure to the patient and also outlined possible surgical and non-surgical treatment options of OSA, including the use of a custom-made  dental device (which would require a referral to a specialist dentist or oral surgeon), upper airway surgical options, such as pillar implants, radiofrequency surgery, tongue base surgery, and UPPP (which would involve a referral to an ENT surgeon). Rarely, jaw surgery such as mandibular advancement may be considered.  I also explained the CPAP treatment option to the patient, who indicated that she would be willing to try CPAP if the need arises. I explained the importance of being compliant with PAP treatment, not only for insurance purposes but primarily to improve Her symptoms, and for the patient's long term health benefit, including to reduce Her cardiovascular risks. I answered all her questions today and the patient was in agreement. I would like to see her back after the sleep study is completed and encouraged her to call with any interim questions, concerns, problems or updates.   Thank you very much for allowing me to participate in the care of this nice patient. If I can be of any further assistance to you please do not hesitate to call me at [phone].  Sincerely,   Star Age, MD, PhD

## 2016-04-28 NOTE — Patient Instructions (Signed)

## 2016-05-14 ENCOUNTER — Ambulatory Visit (INDEPENDENT_AMBULATORY_CARE_PROVIDER_SITE_OTHER): Payer: Medicare Other

## 2016-05-14 VITALS — BP 136/86 | HR 77 | Resp 16 | Wt 163.2 lb

## 2016-05-14 DIAGNOSIS — E559 Vitamin D deficiency, unspecified: Secondary | ICD-10-CM | POA: Diagnosis not present

## 2016-05-14 DIAGNOSIS — Z0000 Encounter for general adult medical examination without abnormal findings: Secondary | ICD-10-CM | POA: Diagnosis not present

## 2016-05-14 DIAGNOSIS — F32A Depression, unspecified: Secondary | ICD-10-CM | POA: Insufficient documentation

## 2016-05-14 DIAGNOSIS — M810 Age-related osteoporosis without current pathological fracture: Secondary | ICD-10-CM | POA: Diagnosis not present

## 2016-05-14 DIAGNOSIS — F329 Major depressive disorder, single episode, unspecified: Secondary | ICD-10-CM

## 2016-05-14 NOTE — Assessment & Plan Note (Signed)
Pt started on Cymbalta at last visit. Has maintained dose of 30 mg daily instead of increasing to 60 mg and reports improvement in symptoms. She has moved back and forth between [place] and [place], [place] several times and feels down every time she moves back to [place], but wants to be here because her family and 57.78 year old grandson are here. Pt to continue current treatments and follow-up w/ PCP as scheduled.

## 2016-05-14 NOTE — Assessment & Plan Note (Signed)
Labs stable on last assessment, continue OTC supplement.

## 2016-05-14 NOTE — Patient Instructions (Addendum)
It was nice to meet you today! Continue checking your BP at home a few times weekly.  Let us know if you want [place] to order your mammogram and bone density scan in August.  Continue doing brain stimulating activities (puzzles, reading, adult coloring books, staying active) to keep memory sharp. Keep searching for a good fit for community involvement. Continue to eat heart healthy diet (full of fruits, vegetables, whole grains, lean protein, water--limit salt, fat, and sugar intake) and increase physical activity as tolerated. Get your bathing suit ready and go to your water aerobics class!

## 2016-05-14 NOTE — Progress Notes (Signed)
Subjective:   Maria [name] is a 80 y.o. female who presents for Medicare Annual (Subsequent) preventive examination.  Review of Systems:  No ROS.  Medicare Wellness Visit.   Cardiac Risk Factors include: advanced age (>2mn, >>61women)    Sleep patterns: Gets up 1-2x nightly, 7-8 hours nightly but feels tired in the mornings, sleep study Monday    Home Safety/Smoke Alarms: Lives alone w/ dog, feels safe in home. Smoke alarms in home. No firearms in home.  Seat Belt Safety/Bike Helmet: Wears seat belt.   Counseling:   Eye Exam-sees Dr. MNewman Nipyearly, has cataracts and is holding off on surgery at this time  Dental-every 6 months  Female:   Pap-Hysterecotmy       Mammo-UTD, next due in August.       Dexa scan-Due, pt will schedule w/ [facility]        CCS: last 2014 w/ Dr. BOlevia Perches no polyps but some diverticulosis, 5 year recall but pt desires no further screening given her age.    Objective:     Vitals: BP 136/86 mmHg  Pulse 77  Resp 16  Wt 163 lb 3.2 oz (74.027 kg)  SpO2 97%  Body mass index is 26.35 kg/(m^2).   Tobacco History  Smoking status  . Former Smoker -- 20 years  . Types: Cigarettes  . Quit date: 05/03/1979  Smokeless tobacco  . Never Used    Comment: smoked 1962-1980, up to < 1 ppd     Counseling given: Not Answered   Past Medical History  Diagnosis Date  . Diverticulosis   . Goiter, nodular     [place] stable 7/08  . Osteopenia     Dr FPamala Hurry . Vitamin D deficiency   . Mitral valve prolapse   . Heart murmur     due to rheumatic fever as child  . Complication of anesthesia     slow to awaken  . PONV (postoperative nausea and vomiting)     n/v  . [place]    Non-small cell  . Osteoarthritis 11/14/2013    2010 intra-articular steroids X 3 S/P Physical Therapy in 12/14 TKR 12/11/13   . Multiple thyroid nodules 11/24/2015  . Depression    Past Surgical History  Procedure Laterality Date  . Lobectomy   12/02    RU; no radiation or chemo, Dr. BArlyce Dice . Colonoscopy  2009 , 2014    Diverticulosis; Dr. BOlevia Perches . Tonsillectomy    . Breast biopsy Right   . Abdominal hysterectomy  1998    No BSO, for dysfunctional menses  . Total knee arthroplasty Right 12/11/2013    Procedure: RIGHT TOTAL KNEE ARTHROPLASTY;  Surgeon: FGearlean Alf MD;  Location: [facility];  Service: Orthopedics;  Laterality: Right;  . Bladder suspension Bilateral    Family History  Problem Relation Age of Onset  . COPD Mother   . Coronary artery disease Father   . Colon cancer Father 462 . Heart attack Father 61 . Stroke Father     > 548 . Lung cancer Maternal Uncle     smoker  . Lung cancer Paternal Aunt     smoker  . Cancer Paternal Aunt     lung  . Lung cancer Maternal Grandfather     smoker  . COPD Maternal Grandfather   . Diabetes Neg Hx   . Arthritis Sister    History  Sexual Activity  .  Sexual Activity: No    Comment: lives alone, dog, no dietary restrictions, eating heart healthy    Outpatient Encounter Prescriptions as of 05/14/2016  Medication Sig  . Calcium-Vitamin A-Vitamin D (LIQUID CALCIUM PO) Take by mouth.  . cholecalciferol (VITAMIN D) 1000 units tablet Take 1,000 Units by mouth daily.  . DULoxetine (CYMBALTA) 30 MG capsule Take 1 capsule (30 mg total) by mouth daily. Increase to 2 pills daily after 2 weeks  . folic acid (FOLVITE) 1 MG tablet Take 1 mg by mouth daily.  . Ibuprofen (ADVIL PO) Take 1 tablet by mouth 2 (two) times daily. Reported on 11/15/2015  . Omega-3 Fatty Acids (FISH OIL PO) Take by mouth.  . psyllium (METAMUCIL) 58.6 % powder Take 1 packet by mouth daily. 1 teaspoon  . traMADol (ULTRAM) 50 MG tablet Take 1 tablet (50 mg total) by mouth every 8 (eight) hours as needed.  . [DISCONTINUED] Calcium Carb-Cholecalciferol (CALCIUM 500 +D PO) Take by mouth.   No facility-administered encounter medications on file as of 05/14/2016.    Activities of Daily Living In your present  state of health, do you have any difficulty performing the following activities: 05/14/2016  Hearing? N  Vision? N  Difficulty concentrating or making decisions? N  Walking or climbing stairs? N  Dressing or bathing? N  Doing errands, shopping? N  Preparing Food and eating ? N  Using the Toilet? N  In the past six months, have you accidently leaked urine? Y  Do you have problems with loss of bowel control? N  Managing your Medications? N  Managing your Finances? N  Housekeeping or managing your Housekeeping? N    Patient Care Team: Mosie Lukes, MD as PCP - General (Family Medicine) Gaynelle Arabian, MD as Consulting Physician (Orthopedic Surgery) Crista Luria, MD as Consulting Physician (Dermatology) Aloha Gell, MD as Consulting Physician (Obstetrics and Gynecology) Star Age, MD as Consulting Physician (Neurology)    Assessment:   Physical assessment deferred to PCP.  Elevated BP: Pt reports increased BP at home. Home log sent for scanning. BP averages 130s-140-/80s/90s. Pt does not desire intervention at this time. Defer to PCP for any further assessment.  Exercise Activities and Dietary recommendations Current Exercise Habits: Home exercise routine, Type of exercise: walking;exercise ball;stretching, Frequency (Times/Week): 7, Intensity: Mild, Exercise limited by: orthopedic condition(s)   Diet (meal preparation, eat out, water intake, caffeinated beverages, dairy products, fruits and vegetables): Prepares own food. Enjoys raw fruits/veggies, salads, greens, yogurt. Eats very little meat.   Breakfast: fried egg, english muffin, OJ, Special K , almond milk Lunch: Thin bread [place] and cheese sandwich Dinner: Varies  Goals    . Patient stated     Decrease back pain through improved posture, strength, and alignment through exercise, chiropractor, and maybe starting acupuncture.    . Weight < 158 lb (71.668 kg)     Lose 5 lbs      Fall Risk Fall Risk  05/14/2016  04/15/2016 02/26/2015 07/13/2013  Falls in the past year? Yes No No No  Number falls in past yr: 1 - - -  Injury with Fall? No - - -   Depression Screen PHQ 2/9 Scores 05/14/2016 04/15/2016 02/26/2015 07/13/2013  PHQ - 2 Score 1 4 0 0  PHQ- 9 Score - 14 - -     Cognitive Testing MMSE - Mini Mental State Exam 05/14/2016  Not completed: Refused    Immunization History  Administered Date(s) Administered  . Pneumococcal Conjugate-13 11/15/2015  .  Pneumococcal Polysaccharide-23 11/21/2013  . Tdap 11/14/2013  . Zoster 07/17/2014   Screening Tests Health Maintenance  Topic Date Due  . INFLUENZA VACCINE  07/02/2016 (Originally 06/02/2016)  . COLONOSCOPY  05/12/2018  . TETANUS/TDAP  11/15/2023  . DEXA SCAN  Completed  . ZOSTAVAX  Completed  . PNA vac Low Risk Adult  Completed      Plan:   Continue checking BP at home a few times weekly.  Let us know if you want [place] to order your mammogram and bone density scan in August.  Continue doing brain stimulating activities (puzzles, reading, adult coloring books, staying active) to keep memory sharp. Keep searching for a good fit for community involvement. Continue to eat heart healthy diet (full of fruits, vegetables, whole grains, lean protein, water--limit salt, fat, and sugar intake) and increase physical activity as tolerated. Get your bathing suit ready and go to your water aerobics class!  During the course of the visit the patient was educated and counseled about the following appropriate screening and preventive services:   Vaccines to include Pneumoccal, Influenza, Hepatitis B, Td, Zostavax, HCV  Electrocardiogram  Cardiovascular Disease  Colorectal cancer screening  Bone density screening  Diabetes screening  Glaucoma screening  Mammography/PAP  Nutrition counseling   Patient Instructions (the written plan) was given to the patient.   Dorrene German, RN  05/14/2016

## 2016-05-14 NOTE — Assessment & Plan Note (Signed)
Pt taking OTC calcium and vitamin D supplements, regular weight bearing exercise. Last DEXA 2015 showed osteoporosis, pt plans to schedule repeat next month w/ [facility].

## 2016-05-14 NOTE — Progress Notes (Signed)
RN AWV note reviewed. Agree with documention and plan. 

## 2016-05-15 ENCOUNTER — Telehealth: Payer: Self-pay | Admitting: Family Medicine

## 2016-05-15 NOTE — Telephone Encounter (Signed)
Called the patient left a message to call back. 

## 2016-05-15 NOTE — Telephone Encounter (Signed)
Called the patient to inform her that PCP did receive her BP results. PCP stated numbers high enough to warrant HCTZ 12.5 mg qd.  Disp. #30 with 2 refills, recheck BP/cmp in 2 to 3 weeks. Called left message to call back to inform of information above.

## 2016-05-17 ENCOUNTER — Encounter: Payer: Self-pay | Admitting: Family Medicine

## 2016-05-18 ENCOUNTER — Encounter: Payer: Medicare Other | Admitting: Family Medicine

## 2016-05-18 ENCOUNTER — Ambulatory Visit (INDEPENDENT_AMBULATORY_CARE_PROVIDER_SITE_OTHER): Payer: Medicare Other | Admitting: Neurology

## 2016-05-18 DIAGNOSIS — G4710 Hypersomnia, unspecified: Secondary | ICD-10-CM

## 2016-05-18 DIAGNOSIS — R9431 Abnormal electrocardiogram [ECG] [EKG]: Secondary | ICD-10-CM

## 2016-05-18 DIAGNOSIS — G479 Sleep disorder, unspecified: Secondary | ICD-10-CM

## 2016-05-18 DIAGNOSIS — G4720 Circadian rhythm sleep disorder, unspecified type: Secondary | ICD-10-CM

## 2016-05-18 DIAGNOSIS — G4733 Obstructive sleep apnea (adult) (pediatric): Secondary | ICD-10-CM

## 2016-05-18 DIAGNOSIS — G4734 Idiopathic sleep related nonobstructive alveolar hypoventilation: Secondary | ICD-10-CM

## 2016-05-19 NOTE — Telephone Encounter (Signed)
The patient called back and informed of PCP instructions. She would like to wait on adding medication at this time.  She states the day of her nurse visit appt. BP was fine.  She will keep checking several times daily and message PCP back in a couple days to report #'s and then decide if any changes need to be made.

## 2016-05-19 NOTE — Telephone Encounter (Signed)
Called the patient left message to call back 

## 2016-05-20 ENCOUNTER — Telehealth: Payer: Self-pay | Admitting: Neurology

## 2016-05-20 DIAGNOSIS — G4734 Idiopathic sleep related nonobstructive alveolar hypoventilation: Secondary | ICD-10-CM

## 2016-05-20 DIAGNOSIS — G4733 Obstructive sleep apnea (adult) (pediatric): Secondary | ICD-10-CM

## 2016-05-20 DIAGNOSIS — G479 Sleep disorder, unspecified: Secondary | ICD-10-CM

## 2016-05-20 DIAGNOSIS — R9431 Abnormal electrocardiogram [ECG] [EKG]: Secondary | ICD-10-CM

## 2016-05-20 DIAGNOSIS — G4720 Circadian rhythm sleep disorder, unspecified type: Secondary | ICD-10-CM

## 2016-05-20 NOTE — Telephone Encounter (Signed)
LM for patient to call back.

## 2016-05-20 NOTE — Telephone Encounter (Signed)
Patient referred by Dr. Lorelei Pont, seen by me on 04/28/16, diagnostic PSG on 05/18/16, Ins: [employer] MCR.   Please call and notify the patient that the recent sleep study did confirm the diagnosis of mod to severe obstructive sleep apnea and that I recommend treatment for this in the form of CPAP. This will require a repeat sleep study for proper titration and mask fitting. Please explain to patient and arrange for a CPAP titration study. I have placed an order in the chart. Thanks, and please route to [facility] for scheduling next sleep study.  Star Age, MD, PhD [facility])

## 2016-05-20 NOTE — Telephone Encounter (Signed)
Patient is returning your call.  

## 2016-05-20 NOTE — Telephone Encounter (Signed)
I spoke to patient and she is aware of results and recommendations. She is willing to proceed with titration study. She has some financial concerns about the cost of 2nd study.

## 2016-05-22 ENCOUNTER — Encounter: Payer: Self-pay | Admitting: Family Medicine

## 2016-05-27 ENCOUNTER — Encounter: Payer: Self-pay | Admitting: Neurology

## 2016-06-05 ENCOUNTER — Ambulatory Visit (INDEPENDENT_AMBULATORY_CARE_PROVIDER_SITE_OTHER): Payer: Medicare Other | Admitting: Neurology

## 2016-06-05 DIAGNOSIS — G4733 Obstructive sleep apnea (adult) (pediatric): Secondary | ICD-10-CM | POA: Diagnosis not present

## 2016-06-05 DIAGNOSIS — G4720 Circadian rhythm sleep disorder, unspecified type: Secondary | ICD-10-CM

## 2016-06-05 DIAGNOSIS — G479 Sleep disorder, unspecified: Secondary | ICD-10-CM

## 2016-06-10 ENCOUNTER — Telehealth: Payer: Self-pay | Admitting: Neurology

## 2016-06-10 DIAGNOSIS — G4733 Obstructive sleep apnea (adult) (pediatric): Secondary | ICD-10-CM

## 2016-06-10 NOTE — Telephone Encounter (Signed)
I spoke to patient/ She is aware of results and recommendations below. She would like to proceed with treatment. I will send orders to [facility]. I will send copy of study to PCP and will send the patient a letter reminding her to make f/u appt and stress the importance of compliance.

## 2016-06-10 NOTE — Progress Notes (Signed)
Report completed

## 2016-06-10 NOTE — Telephone Encounter (Signed)
Patient referred by Dr. Lorelei Pont, seen by me on 04/28/16, diagnostic PSG on 05/18/16, cpap study on 06/05/16, Ins: [employer] MCR.  Please call and inform patient that I have entered an order for treatment with positive airway pressure (PAP) treatment of obstructive sleep apnea (OSA). She did well during the latest sleep study with CPAP. We will, therefore, arrange for a machine for home use through a DME (durable medical equipment) company of Her choice; and I will see the patient back in follow-up in about 8-10 weeks. Please also explain to the patient that I will be looking out for compliance data, which can be downloaded from the machine (stored on an SD card, that is inserted in the machine) or via remote access through a modem, that is built into the machine. At the time of the followup appointment we will discuss sleep study results and how it is going with PAP treatment at home. Please advise patient to bring Her machine at the time of the first FU visit, even though this is cumbersome. Bringing the machine for every visit after that will likely not be needed, but often helps for the first visit to troubleshoot if needed. Please re-enforce the importance of compliance with treatment and the need for [place] to monitor compliance data - often an insurance requirement and actually good feedback for the patient as far as how they are doing.  Also remind patient, that any interim PAP machine or mask issues should be first addressed with the DME company, as they can often help better with technical and mask fit issues. Please ask if patient has a preference regarding DME company.  Please also make sure, the patient has a follow-up appointment with me in about 8-10 weeks from the setup date, thanks.  Once you have spoken to the patient - and faxed/routed report to PCP and referring MD (if other than PCP), you can close this encounter, thanks,   Star Age, MD, PhD [facility] ([place])

## 2016-06-15 ENCOUNTER — Encounter: Payer: Self-pay | Admitting: Family Medicine

## 2016-06-15 DIAGNOSIS — G4733 Obstructive sleep apnea (adult) (pediatric): Secondary | ICD-10-CM | POA: Insufficient documentation

## 2016-06-16 ENCOUNTER — Encounter: Payer: Self-pay | Admitting: Family Medicine

## 2016-06-16 ENCOUNTER — Ambulatory Visit (INDEPENDENT_AMBULATORY_CARE_PROVIDER_SITE_OTHER): Payer: Medicare Other | Admitting: Family Medicine

## 2016-06-16 DIAGNOSIS — E559 Vitamin D deficiency, unspecified: Secondary | ICD-10-CM | POA: Diagnosis not present

## 2016-06-16 DIAGNOSIS — G4733 Obstructive sleep apnea (adult) (pediatric): Secondary | ICD-10-CM

## 2016-06-16 DIAGNOSIS — F329 Major depressive disorder, single episode, unspecified: Secondary | ICD-10-CM

## 2016-06-16 DIAGNOSIS — Z0000 Encounter for general adult medical examination without abnormal findings: Secondary | ICD-10-CM

## 2016-06-16 DIAGNOSIS — M25569 Pain in unspecified knee: Secondary | ICD-10-CM | POA: Insufficient documentation

## 2016-06-16 DIAGNOSIS — F32A Depression, unspecified: Secondary | ICD-10-CM

## 2016-06-16 DIAGNOSIS — M25562 Pain in left knee: Secondary | ICD-10-CM

## 2016-06-16 DIAGNOSIS — E782 Mixed hyperlipidemia: Secondary | ICD-10-CM

## 2016-06-16 DIAGNOSIS — M810 Age-related osteoporosis without current pathological fracture: Secondary | ICD-10-CM

## 2016-06-16 LAB — CBC
HCT: 45.3 % (ref 36.0–46.0)
Hemoglobin: 15.5 g/dL — ABNORMAL HIGH (ref 12.0–15.0)
MCHC: 34.2 g/dL (ref 30.0–36.0)
MCV: 91 fl (ref 78.0–100.0)
Platelets: 270 10*3/uL (ref 150.0–400.0)
RBC: 4.98 Mil/uL (ref 3.87–5.11)
RDW: 13.6 % (ref 11.5–15.5)
WBC: 7.6 10*3/uL (ref 4.0–10.5)

## 2016-06-16 LAB — LIPID PANEL
Cholesterol: 223 mg/dL — ABNORMAL HIGH (ref 0–200)
HDL: 71.2 mg/dL (ref >39.00)
LDL Cholesterol: 127 mg/dL — ABNORMAL HIGH (ref 0–99)
NonHDL: 151.77
Total CHOL/HDL Ratio: 3
Triglycerides: 124 mg/dL (ref 0.0–149.0)
VLDL: 24.8 mg/dL (ref 0.0–40.0)

## 2016-06-16 LAB — COMPREHENSIVE METABOLIC PANEL
ALT: 20 U/L (ref 0–35)
AST: 28 U/L (ref 0–37)
Albumin: 4.4 g/dL (ref 3.5–5.2)
Alkaline Phosphatase: 77 U/L (ref 39–117)
BUN: 15 mg/dL (ref 6–23)
CO2: 31 mEq/L (ref 19–32)
Calcium: 9.6 mg/dL (ref 8.4–10.5)
Chloride: 100 mEq/L (ref 96–112)
Creatinine, Ser: 0.68 mg/dL (ref 0.40–1.20)
GFR: 88.82 mL/min (ref >60.00)
Glucose, Bld: 103 mg/dL — ABNORMAL HIGH (ref 70–99)
Potassium: 3.6 mEq/L (ref 3.5–5.1)
Sodium: 139 mEq/L (ref 135–145)
Total Bilirubin: 1 mg/dL (ref 0.2–1.2)
Total Protein: 7 g/dL (ref 6.0–8.3)

## 2016-06-16 LAB — TSH: TSH: 2.1 u[IU]/mL (ref 0.35–4.50)

## 2016-06-16 LAB — VITAMIN D 25 HYDROXY (VIT D DEFICIENCY, FRACTURES): VITD: 30.44 ng/mL (ref 30.00–100.00)

## 2016-06-16 NOTE — Assessment & Plan Note (Signed)
Recent flare, responds well to steroid injections is going to return to her orthopaedist for further care encouraged topical treatments in interim

## 2016-06-16 NOTE — Progress Notes (Signed)
Pre visit review using our clinic review tool, if applicable. No additional management support is needed unless otherwise documented below in the visit note. 

## 2016-06-16 NOTE — Assessment & Plan Note (Signed)
Encouraged to get adequate exercise, calcium and vitamin d intake 

## 2016-06-16 NOTE — Patient Instructions (Signed)
Preventive Care for Adults, Female A healthy lifestyle and preventive care can promote health and wellness. Preventive health guidelines for women include the following key practices.  A routine yearly physical is a good way to check with your health care provider about your health and preventive screening. It is a chance to share any concerns and updates on your health and to receive a thorough exam.  Visit your dentist for a routine exam and preventive care every 6 months. Brush your teeth twice a day and floss once a day. Good oral hygiene prevents tooth decay and gum disease.  The frequency of eye exams is based on your age, health, family medical history, use of contact lenses, and other factors. Follow your health care provider's recommendations for frequency of eye exams.  Eat a healthy diet. Foods like vegetables, fruits, whole grains, low-fat dairy products, and lean protein foods contain the nutrients you need without too many calories. Decrease your intake of foods high in solid fats, added sugars, and salt. Eat the right amount of calories for you.Get information about a proper diet from your health care provider, if necessary.  Regular physical exercise is one of the most important things you can do for your health. Most adults should get at least 150 minutes of moderate-intensity exercise (any activity that increases your heart rate and causes you to sweat) each week. In addition, most adults need muscle-strengthening exercises on 2 or more days a week.  Maintain a healthy weight. The body mass index (BMI) is a screening tool to identify possible weight problems. It provides an estimate of body fat based on height and weight. Your health care provider can find your BMI and can help you achieve or maintain a healthy weight.For adults 20 years and older:  A BMI below 18.5 is considered underweight.  A BMI of 18.5 to 24.9 is normal.  A BMI of 25 to 29.9 is considered overweight.  A  BMI of 30 and above is considered obese.  Maintain normal blood lipids and cholesterol levels by exercising and minimizing your intake of saturated fat. Eat a balanced diet with plenty of fruit and vegetables. Blood tests for lipids and cholesterol should begin at age 45 and be repeated every 5 years. If your lipid or cholesterol levels are high, you are over 50, or you are at high risk for heart disease, you may need your cholesterol levels checked more frequently.Ongoing high lipid and cholesterol levels should be treated with medicines if diet and exercise are not working.  If you smoke, find out from your health care provider how to quit. If you do not use tobacco, do not start.  Lung cancer screening is recommended for adults aged 45-80 years who are at high risk for developing lung cancer because of a history of smoking. A yearly low-dose CT scan of the lungs is recommended for people who have at least a 30-pack-year history of smoking and are a current smoker or have quit within the past 15 years. A pack year of smoking is smoking an average of 1 pack of cigarettes a day for 1 year (for example: 1 pack a day for 30 years or 2 packs a day for 15 years). Yearly screening should continue until the smoker has stopped smoking for at least 15 years. Yearly screening should be stopped for people who develop a health problem that would prevent them from having lung cancer treatment.  If you are pregnant, do not drink alcohol. If you are  breastfeeding, be very cautious about drinking alcohol. If you are not pregnant and choose to drink alcohol, do not have more than 1 drink per day. One drink is considered to be 12 ounces (355 mL) of beer, 5 ounces (148 mL) of wine, or 1.5 ounces (44 mL) of liquor.  Avoid use of street drugs. Do not share needles with anyone. Ask for help if you need support or instructions about stopping the use of drugs.  High blood pressure causes heart disease and increases the risk  of stroke. Your blood pressure should be checked at least every 1 to 2 years. Ongoing high blood pressure should be treated with medicines if weight loss and exercise do not work.  If you are 55-79 years old, ask your health care provider if you should take aspirin to prevent strokes.  Diabetes screening is done by taking a blood sample to check your blood glucose level after you have not eaten for a certain period of time (fasting). If you are not overweight and you do not have risk factors for diabetes, you should be screened once every 3 years starting at age 45. If you are overweight or obese and you are 40-70 years of age, you should be screened for diabetes every year as part of your cardiovascular risk assessment.  Breast cancer screening is essential preventive care for women. You should practice "breast self-awareness." This means understanding the normal appearance and feel of your breasts and may include breast self-examination. Any changes detected, no matter how small, should be reported to a health care provider. Women in their 20s and 30s should have a clinical breast exam (CBE) by a health care provider as part of a regular health exam every 1 to 3 years. After age 40, women should have a CBE every year. Starting at age 40, women should consider having a mammogram (breast X-ray test) every year. Women who have a family history of breast cancer should talk to their health care provider about genetic screening. Women at a high risk of breast cancer should talk to their health care providers about having an MRI and a mammogram every year.  Breast cancer gene (BRCA)-related cancer risk assessment is recommended for women who have family members with BRCA-related cancers. BRCA-related cancers include breast, ovarian, tubal, and peritoneal cancers. Having family members with these cancers may be associated with an increased risk for harmful changes (mutations) in the breast cancer genes BRCA1 and  BRCA2. Results of the assessment will determine the need for genetic counseling and BRCA1 and BRCA2 testing.  Your health care provider may recommend that you be screened regularly for cancer of the pelvic organs (ovaries, uterus, and vagina). This screening involves a pelvic examination, including checking for microscopic changes to the surface of your cervix (Pap test). You may be encouraged to have this screening done every 3 years, beginning at age 21.  For women ages 30-65, health care providers may recommend pelvic exams and Pap testing every 3 years, or they may recommend the Pap and pelvic exam, combined with testing for human papilloma virus (HPV), every 5 years. Some types of HPV increase your risk of cervical cancer. Testing for HPV may also be done on women of any age with unclear Pap test results.  Other health care providers may not recommend any screening for nonpregnant women who are considered low risk for pelvic cancer and who do not have symptoms. Ask your health care provider if a screening pelvic exam is right for   you.  If you have had past treatment for cervical cancer or a condition that could lead to cancer, you need Pap tests and screening for cancer for at least 20 years after your treatment. If Pap tests have been discontinued, your risk factors (such as having a new sexual partner) need to be reassessed to determine if screening should resume. Some women have medical problems that increase the chance of getting cervical cancer. In these cases, your health care provider may recommend more frequent screening and Pap tests.  Colorectal cancer can be detected and often prevented. Most routine colorectal cancer screening begins at the age of 50 years and continues through age 75 years. However, your health care provider may recommend screening at an earlier age if you have risk factors for colon cancer. On a yearly basis, your health care provider may provide home test kits to check  for hidden blood in the stool. Use of a small camera at the end of a tube, to directly examine the colon (sigmoidoscopy or colonoscopy), can detect the earliest forms of colorectal cancer. Talk to your health care provider about this at age 50, when routine screening begins. Direct exam of the colon should be repeated every 5-10 years through age 75 years, unless early forms of precancerous polyps or small growths are found.  People who are at an increased risk for hepatitis B should be screened for this virus. You are considered at high risk for hepatitis B if:  You were born in a country where hepatitis B occurs often. Talk with your health care provider about which countries are considered high risk.  Your parents were born in a high-risk country and you have not received a shot to protect against hepatitis B (hepatitis B vaccine).  You have HIV or AIDS.  You use needles to inject street drugs.  You live with, or have sex with, someone who has hepatitis B.  You get hemodialysis treatment.  You take certain medicines for conditions like cancer, organ transplantation, and autoimmune conditions.  Hepatitis C blood testing is recommended for all people born from 1945 through 1965 and any individual with known risks for hepatitis C.  Practice safe sex. Use condoms and avoid high-risk sexual practices to reduce the spread of sexually transmitted infections (STIs). STIs include gonorrhea, chlamydia, syphilis, trichomonas, herpes, HPV, and human immunodeficiency virus (HIV). Herpes, HIV, and HPV are viral illnesses that have no cure. They can result in disability, cancer, and death.  You should be screened for sexually transmitted illnesses (STIs) including gonorrhea and chlamydia if:  You are sexually active and are younger than 24 years.  You are older than 24 years and your health care provider tells you that you are at risk for this type of infection.  Your sexual activity has changed  since you were last screened and you are at an increased risk for chlamydia or gonorrhea. Ask your health care provider if you are at risk.  If you are at risk of being infected with HIV, it is recommended that you take a prescription medicine daily to prevent HIV infection. This is called preexposure prophylaxis (PrEP). You are considered at risk if:  You are sexually active and do not regularly use condoms or know the HIV status of your partner(s).  You take drugs by injection.  You are sexually active with a partner who has HIV.  Talk with your health care provider about whether you are at high risk of being infected with HIV. If   you choose to begin PrEP, you should first be tested for HIV. You should then be tested every 3 months for as long as you are taking PrEP.  Osteoporosis is a disease in which the bones lose minerals and strength with aging. This can result in serious bone fractures or breaks. The risk of osteoporosis can be identified using a bone density scan. Women ages 67 years and over and women at risk for fractures or osteoporosis should discuss screening with their health care providers. Ask your health care provider whether you should take a calcium supplement or vitamin D to reduce the rate of osteoporosis.  Menopause can be associated with physical symptoms and risks. Hormone replacement therapy is available to decrease symptoms and risks. You should talk to your health care provider about whether hormone replacement therapy is right for you.  Use sunscreen. Apply sunscreen liberally and repeatedly throughout the day. You should seek shade when your shadow is shorter than you. Protect yourself by wearing long sleeves, pants, a wide-brimmed hat, and sunglasses year round, whenever you are outdoors.  Once a month, do a whole body skin exam, using a mirror to look at the skin on your back. Tell your health care provider of new moles, moles that have irregular borders, moles that  are larger than a pencil eraser, or moles that have changed in shape or color.  Stay current with required vaccines (immunizations).  Influenza vaccine. All adults should be immunized every year.  Tetanus, diphtheria, and acellular pertussis (Td, Tdap) vaccine. Pregnant women should receive 1 dose of Tdap vaccine during each pregnancy. The dose should be obtained regardless of the length of time since the last dose. Immunization is preferred during the 27th-36th week of gestation. An adult who has not previously received Tdap or who does not know her vaccine status should receive 1 dose of Tdap. This initial dose should be followed by tetanus and diphtheria toxoids (Td) booster doses every 10 years. Adults with an unknown or incomplete history of completing a 3-dose immunization series with Td-containing vaccines should begin or complete a primary immunization series including a Tdap dose. Adults should receive a Td booster every 10 years.  Varicella vaccine. An adult without evidence of immunity to varicella should receive 2 doses or a second dose if she has previously received 1 dose. Pregnant females who do not have evidence of immunity should receive the first dose after pregnancy. This first dose should be obtained before leaving the health care facility. The second dose should be obtained 4-8 weeks after the first dose.  Human papillomavirus (HPV) vaccine. Females aged 13-26 years who have not received the vaccine previously should obtain the 3-dose series. The vaccine is not recommended for use in pregnant females. However, pregnancy testing is not needed before receiving a dose. If a female is found to be pregnant after receiving a dose, no treatment is needed. In that case, the remaining doses should be delayed until after the pregnancy. Immunization is recommended for any person with an immunocompromised condition through the age of 61 years if she did not get any or all doses earlier. During the  3-dose series, the second dose should be obtained 4-8 weeks after the first dose. The third dose should be obtained 24 weeks after the first dose and 16 weeks after the second dose.  Zoster vaccine. One dose is recommended for adults aged 30 years or older unless certain conditions are present.  Measles, mumps, and rubella (MMR) vaccine. Adults born  before 1957 generally are considered immune to measles and mumps. Adults born in 1957 or later should have 1 or more doses of MMR vaccine unless there is a contraindication to the vaccine or there is laboratory evidence of immunity to each of the three diseases. A routine second dose of MMR vaccine should be obtained at least 28 days after the first dose for students attending postsecondary schools, health care workers, or international travelers. People who received inactivated measles vaccine or an unknown type of measles vaccine during 1963-1967 should receive 2 doses of MMR vaccine. People who received inactivated mumps vaccine or an unknown type of mumps vaccine before 1979 and are at high risk for mumps infection should consider immunization with 2 doses of MMR vaccine. For females of childbearing age, rubella immunity should be determined. If there is no evidence of immunity, females who are not pregnant should be vaccinated. If there is no evidence of immunity, females who are pregnant should delay immunization until after pregnancy. Unvaccinated health care workers born before 1957 who lack laboratory evidence of measles, mumps, or rubella immunity or laboratory confirmation of disease should consider measles and mumps immunization with 2 doses of MMR vaccine or rubella immunization with 1 dose of MMR vaccine.  Pneumococcal 13-valent conjugate (PCV13) vaccine. When indicated, a person who is uncertain of his immunization history and has no record of immunization should receive the PCV13 vaccine. All adults 65 years of age and older should receive this  vaccine. An adult aged 19 years or older who has certain medical conditions and has not been previously immunized should receive 1 dose of PCV13 vaccine. This PCV13 should be followed with a dose of pneumococcal polysaccharide (PPSV23) vaccine. Adults who are at high risk for pneumococcal disease should obtain the PPSV23 vaccine at least 8 weeks after the dose of PCV13 vaccine. Adults older than 78 years of age who have normal immune system function should obtain the PPSV23 vaccine dose at least 1 year after the dose of PCV13 vaccine.  Pneumococcal polysaccharide (PPSV23) vaccine. When PCV13 is also indicated, PCV13 should be obtained first. All adults aged 65 years and older should be immunized. An adult younger than age 65 years who has certain medical conditions should be immunized. Any person who resides in a nursing home or long-term care facility should be immunized. An adult smoker should be immunized. People with an immunocompromised condition and certain other conditions should receive both PCV13 and PPSV23 vaccines. People with human immunodeficiency virus (HIV) infection should be immunized as soon as possible after diagnosis. Immunization during chemotherapy or radiation therapy should be avoided. Routine use of PPSV23 vaccine is not recommended for American Indians, [place] Natives, or people younger than 65 years unless there are medical conditions that require PPSV23 vaccine. When indicated, people who have unknown immunization and have no record of immunization should receive PPSV23 vaccine. One-time revaccination 5 years after the first dose of PPSV23 is recommended for people aged 19-64 years who have chronic kidney failure, nephrotic syndrome, asplenia, or immunocompromised conditions. People who received 1-2 doses of PPSV23 before age 65 years should receive another dose of PPSV23 vaccine at age 65 years or later if at least 5 years have passed since the previous dose. Doses of PPSV23 are not  needed for people immunized with PPSV23 at or after age 65 years.  Meningococcal vaccine. Adults with asplenia or persistent complement component deficiencies should receive 2 doses of quadrivalent meningococcal conjugate (MenACWY-D) vaccine. The doses should be obtained   at least 2 months apart. Microbiologists working with certain meningococcal bacteria, [place] recruits, people at risk during an outbreak, and people who travel to or live in countries with a high rate of meningitis should be immunized. A first-year college student up through age 34 years who is living in a residence hall should receive a dose if she did not receive a dose on or after her 16th birthday. Adults who have certain high-risk conditions should receive one or more doses of vaccine.  Hepatitis A vaccine. Adults who wish to be protected from this disease, have certain high-risk conditions, work with hepatitis A-infected animals, work in hepatitis A research labs, or travel to or work in countries with a high rate of hepatitis A should be immunized. Adults who were previously unvaccinated and who anticipate close contact with an international adoptee during the first 60 days after arrival in the [place] from a country with a high rate of hepatitis A should be immunized.  Hepatitis B vaccine. Adults who wish to be protected from this disease, have certain high-risk conditions, may be exposed to blood or other infectious body fluids, are household contacts or sex partners of hepatitis B positive people, are clients or workers in certain care facilities, or travel to or work in countries with a high rate of hepatitis B should be immunized.  Haemophilus influenzae type b (Hib) vaccine. A previously unvaccinated person with asplenia or sickle cell disease or having a scheduled splenectomy should receive 1 dose of Hib vaccine. Regardless of previous immunization, a recipient of a hematopoietic stem cell transplant should receive a  3-dose series 6-12 months after her successful transplant. Hib vaccine is not recommended for adults with HIV infection. Preventive Services / Frequency Ages 35 to 4 years  Blood pressure check.** / Every 3-5 years.  Lipid and cholesterol check.** / Every 5 years beginning at age 60.  Clinical breast exam.** / Every 3 years for women in their 71s and 10s.  BRCA-related cancer risk assessment.** / For women who have family members with a BRCA-related cancer (breast, ovarian, tubal, or peritoneal cancers).  Pap test.** / Every 2 years from ages 76 through 26. Every 3 years starting at age 61 through age 76 or 93 with a history of 3 consecutive normal Pap tests.  HPV screening.** / Every 3 years from ages 37 through ages 60 to 51 with a history of 3 consecutive normal Pap tests.  Hepatitis C blood test.** / For any individual with known risks for hepatitis C.  Skin self-exam. / Monthly.  Influenza vaccine. / Every year.  Tetanus, diphtheria, and acellular pertussis (Tdap, Td) vaccine.** / Consult your health care provider. Pregnant women should receive 1 dose of Tdap vaccine during each pregnancy. 1 dose of Td every 10 years.  Varicella vaccine.** / Consult your health care provider. Pregnant females who do not have evidence of immunity should receive the first dose after pregnancy.  HPV vaccine. / 3 doses over 6 months, if 93 and younger. The vaccine is not recommended for use in pregnant females. However, pregnancy testing is not needed before receiving a dose.  Measles, mumps, rubella (MMR) vaccine.** / You need at least 1 dose of MMR if you were born in 1957 or later. You may also need a 2nd dose. For females of childbearing age, rubella immunity should be determined. If there is no evidence of immunity, females who are not pregnant should be vaccinated. If there is no evidence of immunity, females who are  pregnant should delay immunization until after pregnancy.  Pneumococcal  13-valent conjugate (PCV13) vaccine.** / Consult your health care provider.  Pneumococcal polysaccharide (PPSV23) vaccine.** / 1 to 2 doses if you smoke cigarettes or if you have certain conditions.  Meningococcal vaccine.** / 1 dose if you are age 68 to 8 years and a Market researcher living in a residence hall, or have one of several medical conditions, you need to get vaccinated against meningococcal disease. You may also need additional booster doses.  Hepatitis A vaccine.** / Consult your health care provider.  Hepatitis B vaccine.** / Consult your health care provider.  Haemophilus influenzae type b (Hib) vaccine.** / Consult your health care provider. Ages 7 to 53 years  Blood pressure check.** / Every year.  Lipid and cholesterol check.** / Every 5 years beginning at age 25 years.  Lung cancer screening. / Every year if you are aged 11-80 years and have a 30-pack-year history of smoking and currently smoke or have quit within the past 15 years. Yearly screening is stopped once you have quit smoking for at least 15 years or develop a health problem that would prevent you from having lung cancer treatment.  Clinical breast exam.** / Every year after age 48 years.  BRCA-related cancer risk assessment.** / For women who have family members with a BRCA-related cancer (breast, ovarian, tubal, or peritoneal cancers).  Mammogram.** / Every year beginning at age 41 years and continuing for as long as you are in good health. Consult with your health care provider.  Pap test.** / Every 3 years starting at age 65 years through age 37 or 70 years with a history of 3 consecutive normal Pap tests.  HPV screening.** / Every 3 years from ages 72 years through ages 60 to 40 years with a history of 3 consecutive normal Pap tests.  Fecal occult blood test (FOBT) of stool. / Every year beginning at age 21 years and continuing until age 5 years. You may not need to do this test if you get  a colonoscopy every 10 years.  Flexible sigmoidoscopy or colonoscopy.** / Every 5 years for a flexible sigmoidoscopy or every 10 years for a colonoscopy beginning at age 35 years and continuing until age 48 years.  Hepatitis C blood test.** / For all people born from [birth year] through 1965 and any individual with known risks for hepatitis C.  Skin self-exam. / Monthly.  Influenza vaccine. / Every year.  Tetanus, diphtheria, and acellular pertussis (Tdap/Td) vaccine.** / Consult your health care provider. Pregnant women should receive 1 dose of Tdap vaccine during each pregnancy. 1 dose of Td every 10 years.  Varicella vaccine.** / Consult your health care provider. Pregnant females who do not have evidence of immunity should receive the first dose after pregnancy.  Zoster vaccine.** / 1 dose for adults aged 30 years or older.  Measles, mumps, rubella (MMR) vaccine.** / You need at least 1 dose of MMR if you were born in 1957 or later. You may also need a second dose. For females of childbearing age, rubella immunity should be determined. If there is no evidence of immunity, females who are not pregnant should be vaccinated. If there is no evidence of immunity, females who are pregnant should delay immunization until after pregnancy.  Pneumococcal 13-valent conjugate (PCV13) vaccine.** / Consult your health care provider.  Pneumococcal polysaccharide (PPSV23) vaccine.** / 1 to 2 doses if you smoke cigarettes or if you have certain conditions.  Meningococcal vaccine.** /  Consult your health care provider.  Hepatitis A vaccine.** / Consult your health care provider.  Hepatitis B vaccine.** / Consult your health care provider.  Haemophilus influenzae type b (Hib) vaccine.** / Consult your health care provider. Ages 64 years and over  Blood pressure check.** / Every year.  Lipid and cholesterol check.** / Every 5 years beginning at age 23 years.  Lung cancer screening. / Every year if you  are aged 16-80 years and have a 30-pack-year history of smoking and currently smoke or have quit within the past 15 years. Yearly screening is stopped once you have quit smoking for at least 15 years or develop a health problem that would prevent you from having lung cancer treatment.  Clinical breast exam.** / Every year after age 74 years.  BRCA-related cancer risk assessment.** / For women who have family members with a BRCA-related cancer (breast, ovarian, tubal, or peritoneal cancers).  Mammogram.** / Every year beginning at age 44 years and continuing for as long as you are in good health. Consult with your health care provider.  Pap test.** / Every 3 years starting at age 58 years through age 22 or 39 years with 3 consecutive normal Pap tests. Testing can be stopped between 65 and 70 years with 3 consecutive normal Pap tests and no abnormal Pap or HPV tests in the past 10 years.  HPV screening.** / Every 3 years from ages 64 years through ages 70 or 61 years with a history of 3 consecutive normal Pap tests. Testing can be stopped between 65 and 70 years with 3 consecutive normal Pap tests and no abnormal Pap or HPV tests in the past 10 years.  Fecal occult blood test (FOBT) of stool. / Every year beginning at age 40 years and continuing until age 27 years. You may not need to do this test if you get a colonoscopy every 10 years.  Flexible sigmoidoscopy or colonoscopy.** / Every 5 years for a flexible sigmoidoscopy or every 10 years for a colonoscopy beginning at age 7 years and continuing until age 32 years.  Hepatitis C blood test.** / For all people born from [birth year] through 1963 and any individual with known risks for hepatitis C.  Osteoporosis screening.** / A one-time screening for women ages 30 years and over and women at risk for fractures or osteoporosis.  Skin self-exam. / Monthly.  Influenza vaccine. / Every year.  Tetanus, diphtheria, and acellular pertussis (Tdap/Td)  vaccine.** / 1 dose of Td every 10 years.  Varicella vaccine.** / Consult your health care provider.  Zoster vaccine.** / 1 dose for adults aged 35 years or older.  Pneumococcal 13-valent conjugate (PCV13) vaccine.** / Consult your health care provider.  Pneumococcal polysaccharide (PPSV23) vaccine.** / 1 dose for all adults aged 46 years and older.  Meningococcal vaccine.** / Consult your health care provider.  Hepatitis A vaccine.** / Consult your health care provider.  Hepatitis B vaccine.** / Consult your health care provider.  Haemophilus influenzae type b (Hib) vaccine.** / Consult your health care provider. ** Family history and personal history of risk and conditions may change your health care provider's recommendations.   This information is not intended to replace advice given to you by your health care provider. Make sure you discuss any questions you have with your health care provider.   Document Released: 12/15/2001 Document Revised: 11/09/2014 Document Reviewed: 03/16/2011 Elsevier Interactive Patient Education [employer].

## 2016-06-16 NOTE — Progress Notes (Signed)
Patient ID: Maria [name], female   DOB: 16-Jun-1936, 80 y.o.   MRN: [id number]   Subjective:    Patient ID: Maria [name], female    DOB: 1936-06-18, 80 y.o.   MRN: [id number]  Chief Complaint  Patient presents with  . Annual Exam    HPI Patient is in today for annual exam and follow up. No recent illness or acute concerns. No recent hospitalizations. Doing well with ADLs. Denies CP/palp/SOB/HA/congestion/fevers/GI or GU c/o. Taking meds as prescribed. Is eating well and staying active.  Past Medical History:  Diagnosis Date  . Complication of anesthesia    slow to awaken  . Depression   . Diverticulosis   . Goiter, nodular    [place] of thyroid stable 7/08  . Heart murmur    due to rheumatic fever as child  . Mitral valve prolapse   . Multiple thyroid nodules 11/24/2015  . Neoplasm of lung, malignant ([place]) 2002   Non-small cell  . Osteoarthritis 11/14/2013   2010 intra-articular steroids X 3 S/P Physical Therapy in 12/14 TKR 12/11/13   . Osteopenia    Dr Pamala Hurry  . PONV (postoperative nausea and vomiting)    n/v  . Vitamin D deficiency     Past Surgical History:  Procedure Laterality Date  . ABDOMINAL HYSTERECTOMY  1998   No BSO, for dysfunctional menses  . BLADDER SUSPENSION Bilateral   . BREAST BIOPSY Right   . COLONOSCOPY  2009 , 2014   Diverticulosis; Dr. Olevia Perches  . LOBECTOMY  12/02   RU; no radiation or chemo, Dr. Arlyce Dice  . TONSILLECTOMY    . TOTAL KNEE ARTHROPLASTY Right 12/11/2013   Procedure: RIGHT TOTAL KNEE ARTHROPLASTY;  Surgeon: Gearlean Alf, MD;  Location: [facility];  Service: Orthopedics;  Laterality: Right;    Family History  Problem Relation Age of Onset  . COPD Mother   . Coronary artery disease Father   . Colon cancer Father 108  . Heart attack Father 12  . Stroke Father     > 68  . Lung cancer Paternal Aunt     smoker  . Cancer Paternal Aunt     lung  . Lung cancer Maternal Grandfather     smoker  . COPD Maternal Grandfather   .  Arthritis Sister   . Lung cancer Maternal Uncle     smoker  . Diabetes Neg Hx     Social History   Social History  . Marital status: Divorced    Spouse name: N/A  . Number of children: N/A  . Years of education: college   Occupational History  . Retired Retired   Social History Main Topics  . Smoking status: Former Smoker    Years: 20.00    Types: Cigarettes    Quit date: 05/03/1979  . Smokeless tobacco: Never Used     Comment: smoked 1962-1980, up to < 1 ppd  . Alcohol use 4.2 oz/week    7 Standard drinks or equivalent per week     Comment: occasional  . Drug use: No  . Sexual activity: No     Comment: lives alone, dog, no dietary restrictions, eating heart healthy   Other Topics Concern  . Not on file   Social History Narrative   Pt gets regular exercise.   Drinks 2 cups of coffee a day     Outpatient Medications Prior to Visit  Medication Sig Dispense Refill  . Calcium-Vitamin A-Vitamin D (LIQUID CALCIUM PO) Take by mouth.    Marland Kitchen  cholecalciferol (VITAMIN D) 1000 units tablet Take 1,000 Units by mouth daily.    . DULoxetine (CYMBALTA) 30 MG capsule Take 1 capsule (30 mg total) by mouth daily. Increase to 2 pills daily after 2 weeks 60 capsule 5  . folic acid (FOLVITE) 1 MG tablet Take 1 mg by mouth daily.    . Ibuprofen (ADVIL PO) Take 1 tablet by mouth 2 (two) times daily. Reported on 11/15/2015    . Omega-3 Fatty Acids (FISH OIL PO) Take by mouth.    . psyllium (METAMUCIL) 58.6 % powder Take 1 packet by mouth daily. 1 teaspoon    . traMADol (ULTRAM) 50 MG tablet Take 1 tablet (50 mg total) by mouth every 8 (eight) hours as needed. 30 tablet 1   No facility-administered medications prior to visit.     Allergies  Allergen Reactions  . Morphine And Related     Mental status change  . Oxycodone     Mental status changes post op 12/11/13  . Sulfonamide Derivatives     REACTION: stomach pain    Review of Systems  Constitutional: Negative for fever and  malaise/fatigue.  HENT: Negative for congestion.   Eyes: Negative for blurred vision.  Respiratory: Negative for shortness of breath.   Cardiovascular: Negative for chest pain, palpitations and leg swelling.  Gastrointestinal: Negative for abdominal pain, blood in stool and nausea.  Genitourinary: Negative for dysuria and frequency.  Musculoskeletal: Negative for falls.  Skin: Negative for rash.  Neurological: Negative for dizziness, loss of consciousness and headaches.  Endo/Heme/Allergies: Negative for environmental allergies.  Psychiatric/Behavioral: Negative for depression. The patient is not nervous/anxious.        Objective:    Physical Exam  Constitutional: She is oriented to person, place, and time. She appears well-developed and well-nourished. No distress.  HENT:  Head: Normocephalic and atraumatic.  Eyes: Conjunctivae are normal.  Neck: Neck supple. No thyromegaly present.  Cardiovascular: Normal rate, regular rhythm and normal heart sounds.   No murmur heard. Pulmonary/Chest: Effort normal and breath sounds normal. No respiratory distress.  Abdominal: Soft. Bowel sounds are normal. She exhibits no distension and no mass. There is no tenderness.  Musculoskeletal: She exhibits no edema.  Lymphadenopathy:    She has no cervical adenopathy.  Neurological: She is alert and oriented to person, place, and time.  Skin: Skin is warm and dry.  Psychiatric: She has a normal mood and affect. Her behavior is normal.    BP 132/82 (BP Location: Left Arm, Patient Position: Sitting, Cuff Size: Normal)   Pulse 79   Temp 98.1 F (36.7 C) (Oral)   Ht '5\' 6"'$  (1.676 m)   Wt 164 lb 2 oz (74.4 kg)   SpO2 94%   BMI 26.49 kg/m  Wt Readings from Last 3 Encounters:  06/16/16 164 lb 2 oz (74.4 kg)  05/14/16 163 lb 3.2 oz (74 kg)  04/28/16 163 lb (73.9 kg)     Lab Results  Component Value Date   WBC 8.5 11/15/2015   HGB 15.5 (H) 11/15/2015   HCT 45.7 11/15/2015   PLT 289.0  11/15/2015   GLUCOSE 92 11/15/2015   CHOL 199 11/15/2015   TRIG 94.0 11/15/2015   HDL 67.00 11/15/2015   LDLDIRECT 137.2 10/23/2008   LDLCALC 114 (H) 11/15/2015   ALT 16 11/15/2015   AST 24 11/15/2015   NA 141 11/15/2015   K 3.8 11/15/2015   CL 102 11/15/2015   CREATININE 0.73 11/15/2015   BUN 15 11/15/2015  CO2 32 11/15/2015   TSH 1.26 11/15/2015   INR 0.91 12/04/2013   HGBA1C 5.6 02/13/2014    Lab Results  Component Value Date   TSH 1.26 11/15/2015   Lab Results  Component Value Date   WBC 8.5 11/15/2015   HGB 15.5 (H) 11/15/2015   HCT 45.7 11/15/2015   MCV 92.0 11/15/2015   PLT 289.0 11/15/2015   Lab Results  Component Value Date   NA 141 11/15/2015   K 3.8 11/15/2015   CO2 32 11/15/2015   GLUCOSE 92 11/15/2015   BUN 15 11/15/2015   CREATININE 0.73 11/15/2015   BILITOT 1.0 11/15/2015   ALKPHOS 86 11/15/2015   AST 24 11/15/2015   ALT 16 11/15/2015   PROT 7.4 11/15/2015   ALBUMIN 4.5 11/15/2015   CALCIUM 9.4 11/15/2015   GFR 81.96 11/15/2015   Lab Results  Component Value Date   CHOL 199 11/15/2015   Lab Results  Component Value Date   HDL 67.00 11/15/2015   Lab Results  Component Value Date   LDLCALC 114 (H) 11/15/2015   Lab Results  Component Value Date   TRIG 94.0 11/15/2015   Lab Results  Component Value Date   CHOLHDL 3 11/15/2015   Lab Results  Component Value Date   HGBA1C 5.6 02/13/2014       Assessment & Plan:   Problem List Items Addressed This Visit    Vitamin D deficiency    Taking Vitamin 1000 IU daily      Relevant Orders   Vitamin D (25 hydroxy)   Hyperlipidemia, mixed    Encouraged heart healthy diet, increase exercise, avoid trans fats, consider a krill oil cap daily      Relevant Orders   Lipid panel   Osteoporosis    Encouraged to get adequate exercise, calcium and vitamin d intake      Relevant Orders   Comprehensive metabolic panel   DG Bone Density   Depression    Is doing well on Duloxetine 30  mg qd if she decides to titrate off we will drop to 20 mg daily then stop.       Relevant Orders   TSH   CBC   OSA (obstructive sleep apnea)    Awaiting CPAP machine but is looking forward to trying it is mildly claustrophobic so may need to try Melatonin      Preventative health care    Patient encouraged to maintain heart healthy diet, regular exercise, adequate sleep. Consider daily probiotics. Take medications as prescribed. Given and reviewed copy of ACP documents from [employer] and encouraged to complete and return      Relevant Orders   TSH   CBC   Comprehensive metabolic panel   Knee pain    Recent flare, responds well to steroid injections is going to return to her orthopaedist for further care encouraged topical treatments in interim        Other Visit Diagnoses   None.     I am having Ms. Uhde maintain her psyllium, Ibuprofen (ADVIL PO), traMADol, DULoxetine, Omega-3 Fatty Acids (FISH OIL PO), cholecalciferol, Calcium-Vitamin A-Vitamin D (LIQUID CALCIUM PO), and folic acid.  No orders of the defined types were placed in this encounter.    Penni Homans, MD

## 2016-06-16 NOTE — Assessment & Plan Note (Signed)
Taking Vitamin 1000 IU daily

## 2016-06-16 NOTE — Assessment & Plan Note (Signed)
Encouraged heart healthy diet, increase exercise, avoid trans fats, consider a krill oil cap daily 

## 2016-06-16 NOTE — Assessment & Plan Note (Signed)
Patient encouraged to maintain heart healthy diet, regular exercise, adequate sleep. Consider daily probiotics. Take medications as prescribed. Given and reviewed copy of ACP documents from [place] Secretary of State and encouraged to complete and return 

## 2016-06-16 NOTE — Assessment & Plan Note (Signed)
Is doing well on Duloxetine 30 mg qd if she decides to titrate off we will drop to 20 mg daily then stop.

## 2016-06-16 NOTE — Assessment & Plan Note (Signed)
Awaiting CPAP machine but is looking forward to trying it is mildly claustrophobic so may need to try Melatonin

## 2016-06-19 ENCOUNTER — Encounter: Payer: Self-pay | Admitting: Neurology

## 2016-07-03 ENCOUNTER — Telehealth: Payer: Self-pay | Admitting: Family Medicine

## 2016-07-03 NOTE — Telephone Encounter (Signed)
Called the patient informed of bone Density results/mailed a copy to her home per her request. At this time patient ok to do all instructions, but not fosamax for now.  Will certainly think about it.

## 2016-07-03 NOTE — Telephone Encounter (Signed)
Called the patient left a message to call back for Bone Density Results. Results are---Has progressed from osteopenia to osteoporosis. Will check vitamin D level. Need to take citracal bid and vitamin D 2000 IU daily.  Also if patient agrees PCP would like her to start on fosamax 70 mg weekly  Disp #4 with 3 refills if willing to try.

## 2016-07-20 ENCOUNTER — Encounter: Payer: Self-pay | Admitting: Family Medicine

## 2016-08-06 ENCOUNTER — Ambulatory Visit
Admission: RE | Admit: 2016-08-06 | Discharge: 2016-08-06 | Disposition: A | Discharge status: Home / Self Care | Payer: Medicare Other | Source: Ambulatory Visit | Attending: Physician Assistant | Admitting: Physician Assistant

## 2016-08-06 ENCOUNTER — Other Ambulatory Visit: Payer: Self-pay | Admitting: Physician Assistant

## 2016-08-06 DIAGNOSIS — Z0000 Encounter for general adult medical examination without abnormal findings: Secondary | ICD-10-CM

## 2016-08-12 ENCOUNTER — Ambulatory Visit (INDEPENDENT_AMBULATORY_CARE_PROVIDER_SITE_OTHER): Payer: Medicare Other | Admitting: Neurology

## 2016-08-12 ENCOUNTER — Encounter: Payer: Self-pay | Admitting: Neurology

## 2016-08-12 VITALS — BP 128/72 | HR 88 | Resp 16 | Ht 66.0 in | Wt 165.0 lb

## 2016-08-12 DIAGNOSIS — Z9989 Dependence on other enabling machines and devices: Secondary | ICD-10-CM

## 2016-08-12 DIAGNOSIS — G4733 Obstructive sleep apnea (adult) (pediatric): Secondary | ICD-10-CM

## 2016-08-12 NOTE — Patient Instructions (Signed)

## 2016-08-12 NOTE — Progress Notes (Signed)
Subjective:    Patient ID: Maria [name] is a 80 y.o. female.  HPI    Interim history:   Maria [name] is a 80 year old right-handed lady with an underlying medical history non-small cell lung cancer, status post lobectomy in 2002, hypertension, depression, thyroid nodules, arthritis, hyponatremia, hypokalemia, hyperlipidemia, vitamin D deficiency, osteoporosis, and overweight state, who presents for follow-up consultation of her sleep apnea, after her recent sleep studies. The patient is unaccompanied today. I first met her on 04/28/2016 at the request of her primary care physician, at which time she reported snoring and excessive daytime somnolence, morning headaches, sleep disruption and nonrestorative sleep. I invited her back for sleep study. She had a baseline sleep study, followed by a CPAP titration study. I went over her test results with her in detail today.  Baseline sleep study from 05/18/2016 showed a sleep efficiency of 70.9%, latency to sleep was prolonged at 26 minutes and wake after sleep onset was increased at 91 minutes. She had an increased percentage of light stage sleep, slow-wave sleep was 30.5% and there was no REM sleep. She had occasional PVCs on EKG. She had mild snoring. Total AHI was elevated at 28.7 per hour. Average oxygen saturation was only 89%, she was treated with supplemental oxygen which was started and 11:55 PM. Based on her sleep-related complaints and her test results I invited her for a full night CPAP titration study.  She had her CPAP study on 06/05/2016. Sleep efficiency was 75.9%, sleep latency was 26 minutes and wake after sleep onset was 71.5 minutes. She had an increased percentage of slow-wave sleep and REM sleep was 6.4%, REM latency markedly prolonged at 226.5 minutes. She had no significant PLMS. CPAP was titrated from 5 cm to 7 cm. AHI was 0 per hour at the final pressure. Average oxygen saturation was 92%, nadir was 87%. Time below 88% saturation  was less than 1 minute. Based on her test results are prescribed CPAP therapy for home use.  Today, 08/12/2016: I reviewed her CPAP compliance data from 07/12/2016 through 08/10/2016 which is a total of 30 days, during which time she used her machine 29 days with percent used days greater than 4 hours at 93%, indicating excellent compliance with an average usage of 5 hours and 55 minutes, residual AHI 2.3 per hour, leak on the higher side with the 95th percentile at 22.4 L/m on a pressure of 7 cm with EPR of 3.  Today, 08/12/2016: She reports doing better. Sleep is more restful, consolidated and she has been doing better with her sleep hygiene, has adapted well to treatment. Planning to take her CPAP machine to her trip to [place]. Stays busy, particularly with her young grandchildren, her older sons kids. She has 2 sons.  Previously:  04/28/2016: She reports snoring, excessive daytime somnolence, nonrestorative sleep, morning headaches and sleep disruption. I reviewed your office note from 04/15/2016. She is s/p R TKA in 2015 and has L knee pain, s/p multiple knee injection, sees Dr. Maureen Ralphs.  She goes to bed around 9:30 to 10 PM. She has occasional morning headaches, not enough to take medication, but BP has been a little up in the AMs. She denies restless leg symptoms. She has a rise time around 5 AM. She goes to the bathroom once or twice per average night. She lives alone. She is divorced for about 41 years. She is retired from [employer]. She has 2 grown children and 6 grandchildren. She likes to travel. She  stays very active, walks about 2 miles per day. She drinks alcohol occasionally, usually 2 cups of coffee per day, quit smoking in 1980. She recently started Cymbalta 30 mg once daily about 2 weeks ago and feels improved in her mood. She has no TV in the bedroom. She has a small dog sleeps at the end of the bed in a queen size bed. She is not aware of any family history of obstructive  sleep apnea. Her main concern is that she is tired during the day and does not wake up rested. Her Epworth sleepiness score is 12 out of 24 today, her fatigue score is 33 out of 63.  Her Past Medical History Is Significant For: Past Medical History:  Diagnosis Date  . Complication of anesthesia    slow to awaken  . Depression   . Diverticulosis   . Goiter, nodular    [place] of thyroid stable 7/08  . Heart murmur    due to rheumatic fever as child  . Mitral valve prolapse   . Multiple thyroid nodules 11/24/2015  . Neoplasm of lung, malignant ([place]) 2002   Non-small cell  . Osteoarthritis 11/14/2013   2010 intra-articular steroids X 3 S/P Physical Therapy in 12/14 TKR 12/11/13   . Osteopenia    Dr Pamala Hurry  . PONV (postoperative nausea and vomiting)    n/v  . Vitamin D deficiency     Her Past Surgical History Is Significant For: Past Surgical History:  Procedure Laterality Date  . ABDOMINAL HYSTERECTOMY  1998   No BSO, for dysfunctional menses  . BLADDER SUSPENSION Bilateral   . BREAST BIOPSY Right   . COLONOSCOPY  2009 , 2014   Diverticulosis; Dr. Olevia Perches  . LOBECTOMY  12/02   RU; no radiation or chemo, Dr. Arlyce Dice  . TONSILLECTOMY    . TOTAL KNEE ARTHROPLASTY Right 12/11/2013   Procedure: RIGHT TOTAL KNEE ARTHROPLASTY;  Surgeon: Gearlean Alf, MD;  Location: [facility];  Service: Orthopedics;  Laterality: Right;    Her Family History Is Significant For: Family History  Problem Relation Age of Onset  . COPD Mother   . Coronary artery disease Father   . Colon cancer Father 33  . Heart attack Father 32  . Stroke Father     > 69  . Lung cancer Paternal Aunt     smoker  . Cancer Paternal Aunt     lung  . Lung cancer Maternal Grandfather     smoker  . COPD Maternal Grandfather   . Arthritis Sister   . Lung cancer Maternal Uncle     smoker  . Diabetes Neg Hx     Her Social History Is Significant For: Social History   Social History  . Marital status: Divorced    Spouse  name: N/A  . Number of children: N/A  . Years of education: college   Occupational History  . Retired Retired   Social History Main Topics  . Smoking status: Former Smoker    Years: 20.00    Types: Cigarettes    Quit date: 05/03/1979  . Smokeless tobacco: Never Used     Comment: smoked 1962-1980, up to < 1 ppd  . Alcohol use 4.2 oz/week    7 Standard drinks or equivalent per week     Comment: occasional  . Drug use: No  . Sexual activity: No     Comment: lives alone, dog, no dietary restrictions, eating heart healthy   Other Topics Concern  .  None   Social History Narrative   Pt gets regular exercise.   Drinks 2 cups of coffee a day     Her Allergies Are:  Allergies  Allergen Reactions  . Morphine And Related     Mental status change  . Oxycodone     Mental status changes post op 12/11/13  . Sulfonamide Derivatives     REACTION: stomach pain  :   Her Current Medications Are:  Outpatient Encounter Prescriptions as of 08/12/2016  Medication Sig  . Calcium-Vitamin A-Vitamin D (LIQUID CALCIUM PO) Take by mouth.  . cholecalciferol (VITAMIN D) 1000 units tablet Take 1,000 Units by mouth daily.  . DULoxetine (CYMBALTA) 30 MG capsule Take 1 capsule (30 mg total) by mouth daily. Increase to 2 pills daily after 2 weeks  . folic acid (FOLVITE) 1 MG tablet Take 1 mg by mouth daily.  . Ibuprofen (ADVIL PO) Take 1 tablet by mouth 2 (two) times daily. Reported on 11/15/2015  . Omega-3 Fatty Acids (FISH OIL PO) Take by mouth.  . psyllium (METAMUCIL) 58.6 % powder Take 1 packet by mouth daily. 1 teaspoon  . traMADol (ULTRAM) 50 MG tablet Take 1 tablet (50 mg total) by mouth every 8 (eight) hours as needed.   No facility-administered encounter medications on file as of 08/12/2016.   :  Review of Systems:  Out of a complete 14 point review of systems, all are reviewed and negative with the exception of these symptoms as listed below: Review of Systems  Neurological:       Patient  reports that she has noticed a difference in how she feels after starting CPAP.     Objective:  Neurologic Exam  Physical Exam Physical Examination:   Vitals:   08/12/16 0940  BP: 128/72  Pulse: 88  Resp: 16    General Examination: The patient is a very pleasant 80 y.o. female in no acute distress. she appears well-developed and well-nourished and very well groomed.   HEENT: Normocephalic, atraumatic, pupils are equal, round and reactive to light and accommodation. Funduscopic exam is normal with sharp disc margins noted. Extraocular tracking is good without limitation to gaze excursion or nystagmus noted. Normal smooth pursuit is noted. Hearing is grossly intact. Face is symmetric with normal facial animation and normal facial sensation. Speech is clear with no dysarthria noted. There is no hypophonia. There is no lip, neck/head, jaw or voice tremor. Neck is supple with full range of passive and active motion. There are no carotid bruits on auscultation. Oropharynx exam reveals: mild mouth dryness, adequate dental hygiene and moderate airway crowding, due to redundant soft palate and larger uvula. Mallampati is class II. Tongue protrudes centrally and palate elevates symmetrically. Tonsils are absent.   Chest: Clear to auscultation without wheezing, rhonchi or crackles noted.  Heart: S1+S2+0, regular and normal without murmurs, rubs or gallops noted.   Abdomen: Soft, non-tender and non-distended with normal bowel sounds appreciated on auscultation.  Extremities: There is no pitting edema in the distal lower extremities bilaterally. Pedal pulses are intact.  Skin: Warm and dry without trophic changes noted. There are no varicose veins.  Musculoskeletal: exam reveals prominent joint deformities in her hands in keeping with osteoarthritis, she has left knee pain and decrease in range of motion on the left. She has low back pain.   Neurologically:  Mental status: The patient is awake,  alert and oriented in all 4 spheres. Her immediate and remote memory, attention, language skills and fund of knowledge  are appropriate. There is no evidence of aphasia, agnosia, apraxia or anomia. Speech is clear with normal prosody and enunciation. Thought process is linear. Mood is normal and affect is normal.  Cranial nerves II - XII are as described above under HEENT exam. In addition: shoulder shrug is normal with equal shoulder height noted. Motor exam: Normal bulk, strength and tone is noted. There is no drift, tremor or rebound. Romberg is negative. Reflexes are 1+ throughout. Babinski: Toes are flexor bilaterally. Fine motor skills and coordination: intact.  Cerebellar testing: No dysmetria or intention tremor on finger to nose testing. There is no truncal or gait ataxia.  Sensory exam: intact to light touch, pinprick, vibration, temperature sense in the upper and lower extremities.  Gait, station and balance: She stands with difficulty. No veering to one side is noted. No leaning to one side is noted. Posture shows increase in lumbar kyphosis and stoop in posture. She has low back pain, nonradiating. She has a limp on the left. She walks slowly and cautiously. Tandem walk is not possible for her.    Assessment and Plan:  In summary, Maria [name] is a very pleasant 80 year old female with an underlying medical history non-small cell lung cancer, status post lobectomy in 2002, hypertension, depression, thyroid nodules, arthritis, hyponatremia, hypokalemia, hyperlipidemia, vitamin D deficiency, osteoporosis, and overweight state, who presents for follow-up consultation of her obstructive sleep apnea, after her recent sleep studies. Her baseline sleep study from 05/18/2016 showed moderate obstructive sleep apnea, in the absence of REM sleep. She had good results during her CPAP titration study from 06/05/2016. We talked about sleep study results in detail today. We also reviewed her compliance  data. She is fully compliant with treatment commended for this. She feels improved in her sleep-related complaints including Sleep quality, daytime tiredness, sleep consolidation. I re-iterated the risks and ramifications of untreated moderate to severe OSA, especially with respect to developing cardiovascular disease down the Road, including congestive heart failure, difficult to treat hypertension, cardiac arrhythmias, or stroke. Even type 2 diabetes has, in part, been linked to untreated OSA. Symptoms of untreated OSA include daytime sleepiness, memory problems, mood irritability and mood disorder such as depression and anxiety, lack of energy, as well as recurrent headaches, especially morning headaches. We talked about trying to maintain a healthy lifestyle in general, as well as the importance of weight control. I encouraged the patient to eat healthy, exercise daily and keep well hydrated, to keep a scheduled bedtime and wake time routine, to not skip any meals and eat healthy snacks in between meals. I advised the patient not to drive when feeling sleepy.  I recommended she continue with CPAP therapy with ongoing full compliance. Physical exam is stable.   I again explained the importance of being compliant with PAP treatment, not only for insurance purposes but primarily to improve Her symptoms, and for the patient's long term health benefit, including to reduce Her cardiovascular risks. I answered all her questions today and the patient was in agreement. I would like to see her back after the sleep study is completed and encouraged her to call with any interim questions, concerns, problems or updates.  I spent 25 minutes in total face-to-face time with the patient, more than 50% of which was spent in counseling and coordination of care, reviewing test results, reviewing medication and discussing or reviewing the diagnosis of OSA, its prognosis and treatment options.

## 2016-10-02 ENCOUNTER — Encounter: Payer: Self-pay | Admitting: Neurology

## 2016-10-19 ENCOUNTER — Telehealth: Payer: Self-pay | Admitting: Family Medicine

## 2016-10-19 MED ORDER — DULOXETINE HCL 30 MG PO CPEP: 30.0000 mg | ORAL_CAPSULE | Freq: Every day | ORAL | 5 refills | Status: DC

## 2016-10-19 NOTE — Telephone Encounter (Signed)
Pharmacy is calling to request a refill of patient's  DULoxetine (CYMBALTA) 30 MG capsule Please advise.    Pharmacy:[id number]- [place], [place] - [address]

## 2016-11-03 ENCOUNTER — Encounter (HOSPITAL_COMMUNITY): Payer: Self-pay | Admitting: Emergency Medicine

## 2016-11-03 ENCOUNTER — Ambulatory Visit (HOSPITAL_COMMUNITY)
Admission: EM | Admit: 2016-11-03 | Discharge: 2016-11-03 | Disposition: A | Discharge status: Home / Self Care | Payer: Medicare Other | Attending: Family Medicine | Admitting: Family Medicine

## 2016-11-03 DIAGNOSIS — R319 Hematuria, unspecified: Secondary | ICD-10-CM | POA: Diagnosis not present

## 2016-11-03 DIAGNOSIS — N390 Urinary tract infection, site not specified: Secondary | ICD-10-CM | POA: Diagnosis not present

## 2016-11-03 DIAGNOSIS — R300 Dysuria: Secondary | ICD-10-CM | POA: Insufficient documentation

## 2016-11-03 DIAGNOSIS — R350 Frequency of micturition: Secondary | ICD-10-CM | POA: Diagnosis not present

## 2016-11-03 LAB — POCT URINALYSIS DIP (DEVICE)
Bilirubin Urine: NEGATIVE
Glucose, UA: NEGATIVE mg/dL
Ketones, ur: NEGATIVE mg/dL
Nitrite: NEGATIVE
Protein, ur: NEGATIVE mg/dL
Specific Gravity, Urine: 1.015 (ref 1.005–1.030)
Urobilinogen, UA: 0.2 mg/dL (ref 0.0–1.0)
pH: 7 (ref 5.0–8.0)

## 2016-11-03 MED ORDER — NITROFURANTOIN MONOHYD MACRO 100 MG PO CAPS: 100.0000 mg | ORAL_CAPSULE | Freq: Two times a day (BID) | ORAL | 0 refills | Status: DC

## 2016-11-03 MED ORDER — PHENAZOPYRIDINE HCL 200 MG PO TABS: 200.0000 mg | ORAL_TABLET | Freq: Three times a day (TID) | ORAL | 0 refills | Status: DC

## 2016-11-03 NOTE — ED Triage Notes (Signed)
Noticed changes in frequency and urgency yesterday and noticed painful urination today.  Denies back pain, denies abdominal pain

## 2016-11-03 NOTE — ED Provider Notes (Signed)
CSN: [id number]     Arrival date & time 11/03/16  1251 History   First MD Initiated Contact with Patient 11/03/16 1410     Chief Complaint  Patient presents with  . Urinary Tract Infection   (Consider location/radiation/quality/duration/timing/severity/associated sxs/prior Treatment) Patient is having dysuria and urinary frequency.   The history is provided by the patient.  Urinary Tract Infection  Pain quality:  Burning Pain severity:  Mild Onset quality:  Sudden Duration:  2 days Timing:  Constant Progression:  Worsening Chronicity:  New Recent urinary tract infections: no   Relieved by:  Nothing Worsened by:  Nothing Ineffective treatments:  None tried   Past Medical History:  Diagnosis Date  . Complication of anesthesia    slow to awaken  . Depression   . Diverticulosis   . Goiter, nodular    [place] of thyroid stable 7/08  . Heart murmur    due to rheumatic fever as child  . Mitral valve prolapse   . Multiple thyroid nodules 11/24/2015  . Neoplasm of lung, malignant ([place]) 2002   Non-small cell  . Osteoarthritis 11/14/2013   2010 intra-articular steroids X 3 S/P Physical Therapy in 12/14 TKR 12/11/13   . Osteopenia    Dr Pamala Hurry  . PONV (postoperative nausea and vomiting)    n/v  . Vitamin D deficiency    Past Surgical History:  Procedure Laterality Date  . ABDOMINAL HYSTERECTOMY  1998   No BSO, for dysfunctional menses  . BLADDER SUSPENSION Bilateral   . BREAST BIOPSY Right   . COLONOSCOPY  2009 , 2014   Diverticulosis; Dr. Olevia Perches  . LOBECTOMY  12/02   RU; no radiation or chemo, Dr. Arlyce Dice  . TONSILLECTOMY    . TOTAL KNEE ARTHROPLASTY Right 12/11/2013   Procedure: RIGHT TOTAL KNEE ARTHROPLASTY;  Surgeon: Gearlean Alf, MD;  Location: [facility];  Service: Orthopedics;  Laterality: Right;   Family History  Problem Relation Age of Onset  . COPD Mother   . Coronary artery disease Father   . Colon cancer Father 71  . Heart attack Father 35  . Stroke Father      > 45  . Lung cancer Paternal Aunt     smoker  . Cancer Paternal Aunt     lung  . Lung cancer Maternal Grandfather     smoker  . COPD Maternal Grandfather   . Arthritis Sister   . Lung cancer Maternal Uncle     smoker  . Diabetes Neg Hx    Social History  Substance Use Topics  . Smoking status: Former Smoker    Years: 20.00    Types: Cigarettes    Quit date: 05/03/1979  . Smokeless tobacco: Never Used     Comment: smoked 1962-1980, up to < 1 ppd  . Alcohol use 4.2 oz/week    7 Standard drinks or equivalent per week     Comment: occasional   OB History    No data available     Review of Systems  Constitutional: Negative.   HENT: Negative.   Eyes: Negative.   Respiratory: Negative.   Cardiovascular: Negative.   Gastrointestinal: Negative.   Endocrine: Negative.   Genitourinary: Positive for dysuria and frequency.  Musculoskeletal: Negative.   Allergic/Immunologic: Negative.   Neurological: Negative.   Hematological: Negative.   Psychiatric/Behavioral: Negative.     Allergies  Morphine and related; Oxycodone; and Sulfonamide derivatives  Home Medications   Prior to Admission medications   Medication Sig Start Date  End Date Taking? Authorizing Provider  Calcium-Vitamin A-Vitamin D (LIQUID CALCIUM PO) Take by mouth.    Historical Provider, MD  cholecalciferol (VITAMIN D) 1000 units tablet Take 1,000 Units by mouth daily.    Historical Provider, MD  DULoxetine (CYMBALTA) 30 MG capsule Take 1 capsule (30 mg total) by mouth daily. Increase to 2 pills daily after 2 weeks 10/19/16   Mosie Lukes, MD  folic acid (FOLVITE) 1 MG tablet Take 1 mg by mouth daily.    Historical Provider, MD  Ibuprofen (ADVIL PO) Take 1 tablet by mouth 2 (two) times daily. Reported on 11/15/2015    Historical Provider, MD  nitrofurantoin, macrocrystal-monohydrate, (MACROBID) 100 MG capsule Take 1 capsule (100 mg total) by mouth 2 (two) times daily. 11/03/16   Lysbeth Penner, FNP  Omega-3 Fatty  Acids (FISH OIL PO) Take by mouth.    Historical Provider, MD  phenazopyridine (PYRIDIUM) 200 MG tablet Take 1 tablet (200 mg total) by mouth 3 (three) times daily. 11/03/16   Lysbeth Penner, FNP  psyllium (METAMUCIL) 58.6 % powder Take 1 packet by mouth daily. 1 teaspoon    Historical Provider, MD  traMADol (ULTRAM) 50 MG tablet Take 1 tablet (50 mg total) by mouth every 8 (eight) hours as needed. 01/31/16   Mosie Lukes, MD   Meds Ordered and Administered this Visit  Medications - No data to display  BP 158/99 (BP Location: Left Arm)   Pulse 81   Temp 97.5 F (36.4 C) (Oral)   Resp 18   SpO2 100%  No data found.   Physical Exam  Constitutional: She appears well-developed and well-nourished.  HENT:  Head: Normocephalic and atraumatic.  Eyes: EOM are normal. Pupils are equal, round, and reactive to light.  Neck: Normal range of motion. Neck supple.  Cardiovascular: Normal rate, regular rhythm and normal heart sounds.   Pulmonary/Chest: Effort normal and breath sounds normal.  Abdominal: Soft. Bowel sounds are normal.  Nursing note and vitals reviewed.   Urgent Care Course   Clinical Course     Procedures (including critical care time)  Labs Review Labs Reviewed  POCT URINALYSIS DIP (DEVICE) - Abnormal; Notable for the following:       Result Value   Hgb urine dipstick MODERATE (*)    Leukocytes, UA LARGE (*)    All other components within normal limits  URINE CULTURE    Imaging Review No results found.   Visual Acuity Review  Right Eye Distance:   Left Eye Distance:   Bilateral Distance:    Right Eye Near:   Left Eye Near:    Bilateral Near:         MDM   1. Urinary tract infection with hematuria, site unspecified    UA cx Pyridium 200 mg one po tid x2days #6 Macrobid '100mg'$  one po bid x 10 days 320  Push po fluids, rest, tylenol and motrin otc prn as directed for fever, arthralgias, and myalgias.  Follow up prn if sx's continue or persist.     Lysbeth Penner, FNP 11/03/16 1511

## 2016-11-05 LAB — URINE CULTURE: Culture: >=100000 — AB

## 2016-11-16 ENCOUNTER — Ambulatory Visit (INDEPENDENT_AMBULATORY_CARE_PROVIDER_SITE_OTHER): Payer: Medicare Other | Admitting: Family Medicine

## 2016-11-16 ENCOUNTER — Encounter: Payer: Self-pay | Admitting: Family Medicine

## 2016-11-16 VITALS — BP 136/80 | HR 89 | Temp 97.8°F | Resp 16 | Ht 64.8 in | Wt 166.2 lb

## 2016-11-16 DIAGNOSIS — R3915 Urgency of urination: Secondary | ICD-10-CM

## 2016-11-16 DIAGNOSIS — N390 Urinary tract infection, site not specified: Secondary | ICD-10-CM | POA: Diagnosis not present

## 2016-11-16 LAB — POC URINALSYSI DIPSTICK (AUTOMATED)
Bilirubin, UA: NEGATIVE
Blood, UA: NEGATIVE
Glucose, UA: NEGATIVE
Ketones, UA: NEGATIVE
Nitrite, UA: NEGATIVE
Protein, UA: NEGATIVE
Spec Grav, UA: 1.015
Urobilinogen, UA: NEGATIVE
pH, UA: 6

## 2016-11-16 MED ORDER — CIPROFLOXACIN HCL 250 MG PO TABS: 250.0000 mg | ORAL_TABLET | Freq: Two times a day (BID) | ORAL | 0 refills | Status: DC

## 2016-11-16 NOTE — Patient Instructions (Signed)
Can try over the counter Claritin.  Also either Rhinocort, Nasocort, or Flonase for nasal swelling.  Can take AZO if needed.

## 2016-11-16 NOTE — Progress Notes (Signed)
Pre visit review using our clinic review tool, if applicable. No additional management support is needed unless otherwise documented below in the visit note. 

## 2016-11-16 NOTE — Progress Notes (Signed)
Subjective:    Patient ID: Maria [name], female    DOB: [birth year], 81 y.o.   MRN: [id number]  Chief Complaint  Patient presents with  . Urinary Urgency    was treated 11/03/16 with antibotics  . Nasal Congestion    [facility] and was given augmentin, went on 10/20/16 and was treated again, no better, last thursday started again with sore throat    HPI Patient is in today for urinary urgency and nasal congestion.  Past Medical History:  Diagnosis Date  . Complication of anesthesia    slow to awaken  . Depression   . Diverticulosis   . Goiter, nodular    [place] of thyroid stable 7/08  . Heart murmur    due to rheumatic fever as child  . Mitral valve prolapse   . Multiple thyroid nodules 11/24/2015  . Neoplasm of lung, malignant ([place]) 2002   Non-small cell  . Osteoarthritis 11/14/2013   2010 intra-articular steroids X 3 S/P Physical Therapy in 12/14 TKR 12/11/13   . Osteopenia    Dr Pamala Hurry  . PONV (postoperative nausea and vomiting)    n/v  . Vitamin D deficiency     Past Surgical History:  Procedure Laterality Date  . ABDOMINAL HYSTERECTOMY  1998   No BSO, for dysfunctional menses  . BLADDER SUSPENSION Bilateral   . BREAST BIOPSY Right   . COLONOSCOPY  2009 , 2014   Diverticulosis; Dr. Olevia Perches  . LOBECTOMY  12/02   RU; no radiation or chemo, Dr. Arlyce Dice  . TONSILLECTOMY    . TOTAL KNEE ARTHROPLASTY Right 12/11/2013   Procedure: RIGHT TOTAL KNEE ARTHROPLASTY;  Surgeon: Gearlean Alf, MD;  Location: [facility];  Service: Orthopedics;  Laterality: Right;    Family History  Problem Relation Age of Onset  . COPD Mother   . Coronary artery disease Father   . Colon cancer Father 24  . Heart attack Father 49  . Stroke Father     > 75  . Lung cancer Paternal Aunt     smoker  . Cancer Paternal Aunt     lung  . Lung cancer Maternal Grandfather     smoker  . COPD Maternal Grandfather   . Arthritis Sister   . Lung cancer Maternal Uncle    smoker  . Diabetes Neg Hx     Social History   Social History  . Marital status: Divorced    Spouse name: N/A  . Number of children: N/A  . Years of education: college   Occupational History  . Retired Retired   Social History Main Topics  . Smoking status: Former Smoker    Years: 20.00    Types: Cigarettes    Quit date: 05/03/1979  . Smokeless tobacco: Never Used     Comment: smoked 1962-1980, up to < 1 ppd  . Alcohol use 4.2 oz/week    7 Standard drinks or equivalent per week     Comment: occasional  . Drug use: No  . Sexual activity: No     Comment: lives alone, dog, no dietary restrictions, eating heart healthy   Other Topics Concern  . Not on file   Social History Narrative   Pt gets regular exercise.   Drinks 2 cups of coffee a day     Outpatient Medications Prior to Visit  Medication Sig Dispense Refill  . DULoxetine (CYMBALTA) 30 MG capsule Take 1 capsule (30 mg total) by mouth daily. Increase  to 2 pills daily after 2 weeks 60 capsule 5  . psyllium (METAMUCIL) 58.6 % powder Take 1 packet by mouth daily. 1 teaspoon    . traMADol (ULTRAM) 50 MG tablet Take 1 tablet (50 mg total) by mouth every 8 (eight) hours as needed. 30 tablet 1  . Calcium-Vitamin A-Vitamin D (LIQUID CALCIUM PO) Take by mouth.    . cholecalciferol (VITAMIN D) 1000 units tablet Take 1,000 Units by mouth daily.    . folic acid (FOLVITE) 1 MG tablet Take 1 mg by mouth daily.    . Ibuprofen (ADVIL PO) Take 1 tablet by mouth 2 (two) times daily. Reported on 11/15/2015    . nitrofurantoin, macrocrystal-monohydrate, (MACROBID) 100 MG capsule Take 1 capsule (100 mg total) by mouth 2 (two) times daily. 20 capsule 0  . Omega-3 Fatty Acids (FISH OIL PO) Take by mouth.    . phenazopyridine (PYRIDIUM) 200 MG tablet Take 1 tablet (200 mg total) by mouth 3 (three) times daily. 6 tablet 0   No facility-administered medications prior to visit.     Allergies  Allergen Reactions  . Morphine And Related      Mental status change  . Oxycodone     Mental status changes post op 12/11/13  . Sulfonamide Derivatives     REACTION: stomach pain    Review of Systems  Constitutional: Negative for fever and malaise/fatigue.  HENT: Negative for congestion.   Eyes: Negative for blurred vision.  Respiratory: Negative for cough and shortness of breath.   Cardiovascular: Negative for chest pain, palpitations and leg swelling.  Gastrointestinal: Negative for vomiting.  Genitourinary: Positive for urgency.  Musculoskeletal: Negative for back pain.  Skin: Negative for rash.  Neurological: Negative for loss of consciousness and headaches.       Objective:    Physical Exam  Constitutional: She is oriented to person, place, and time. She appears well-developed and well-nourished. No distress.  HENT:  Head: Normocephalic and atraumatic.  Eyes: Conjunctivae are normal.  Neck: Normal range of motion. No thyromegaly present.  Cardiovascular: Normal rate and regular rhythm.   Pulmonary/Chest: Effort normal and breath sounds normal. She has no wheezes.  Abdominal: Soft. Bowel sounds are normal. There is no tenderness.  Musculoskeletal: Normal range of motion. She exhibits no edema or deformity.  Neurological: She is alert and oriented to person, place, and time.  Skin: Skin is warm and dry. She is not diaphoretic.  Psychiatric: She has a normal mood and affect.    BP 136/80 (BP Location: Left Arm, Cuff Size: Normal)   Pulse 89   Temp 97.8 F (36.6 C) (Oral)   Resp 16   Ht 5' 4.8" (1.646 m)   Wt 166 lb 3.2 oz (75.4 kg)   SpO2 96%   BMI 27.83 kg/m  Wt Readings from Last 3 Encounters:  11/16/16 166 lb 3.2 oz (75.4 kg)  08/12/16 165 lb (74.8 kg)  06/16/16 164 lb 2 oz (74.4 kg)     Lab Results  Component Value Date   WBC 7.6 06/16/2016   HGB 15.5 (H) 06/16/2016   HCT 45.3 06/16/2016   PLT 270.0 06/16/2016   GLUCOSE 103 (H) 06/16/2016   CHOL 223 (H) 06/16/2016   TRIG 124.0 06/16/2016   HDL  71.20 06/16/2016   LDLDIRECT 137.2 10/23/2008   LDLCALC 127 (H) 06/16/2016   ALT 20 06/16/2016   AST 28 06/16/2016   NA 139 06/16/2016   K 3.6 06/16/2016   CL 100 06/16/2016  CREATININE 0.68 06/16/2016   BUN 15 06/16/2016   CO2 31 06/16/2016   TSH 2.10 06/16/2016   INR 0.91 12/04/2013   HGBA1C 5.6 02/13/2014    Lab Results  Component Value Date   TSH 2.10 06/16/2016   Lab Results  Component Value Date   WBC 7.6 06/16/2016   HGB 15.5 (H) 06/16/2016   HCT 45.3 06/16/2016   MCV 91.0 06/16/2016   PLT 270.0 06/16/2016   Lab Results  Component Value Date   NA 139 06/16/2016   K 3.6 06/16/2016   CO2 31 06/16/2016   GLUCOSE 103 (H) 06/16/2016   BUN 15 06/16/2016   CREATININE 0.68 06/16/2016   BILITOT 1.0 06/16/2016   ALKPHOS 77 06/16/2016   AST 28 06/16/2016   ALT 20 06/16/2016   PROT 7.0 06/16/2016   ALBUMIN 4.4 06/16/2016   CALCIUM 9.6 06/16/2016   GFR 88.82 06/16/2016   Lab Results  Component Value Date   CHOL 223 (H) 06/16/2016   Lab Results  Component Value Date   HDL 71.20 06/16/2016   Lab Results  Component Value Date   LDLCALC 127 (H) 06/16/2016   Lab Results  Component Value Date   TRIG 124.0 06/16/2016   Lab Results  Component Value Date   CHOLHDL 3 06/16/2016   Lab Results  Component Value Date   HGBA1C 5.6 02/13/2014      I acted as a Education administrator for Dr. Carollee Herter.  Guerry Bruin, CMA  Assessment & Plan:   Problem List Items Addressed This Visit    None    Visit Diagnoses    Urinary urgency    -  Primary   Relevant Orders   POCT Urinalysis Dipstick (Automated) (Completed)   Urinary tract infection without hematuria, site unspecified       Relevant Orders   Urine culture (Completed)      I have discontinued Ms. Nguyen's Ibuprofen (ADVIL PO), Omega-3 Fatty Acids (FISH OIL PO), cholecalciferol, Calcium-Vitamin A-Vitamin D (LIQUID CALCIUM PO), folic acid, nitrofurantoin (macrocrystal-monohydrate), and phenazopyridine. I am also having  her start on ciprofloxacin. Additionally, I am having her maintain her psyllium, traMADol, DULoxetine, Levomefolic Acid, OVER THE COUNTER MEDICATION, OVER THE COUNTER MEDICATION, Vitamin D3, and OVER THE COUNTER MEDICATION.  Meds ordered this encounter  Medications  . Levomefolic Acid (5-MTHF) 1 MG CAPS    Sig: Take 1 capsule by mouth daily.  Marland Kitchen OVER THE COUNTER MEDICATION    Sig: Take 1 capsule by mouth daily. Adenosyl/hyroxy b12 2060mg  . OVER THE COUNTER MEDICATION    Sig: Take 1 capsule by mouth daily. Critical Omega  '1200mg'$  fish oil/'900mg'$  omega 3  . Cholecalciferol (VITAMIN D3) 5000 units CAPS    Sig: Take 1 capsule by mouth every 30 (thirty) days. Sun-vit d3  . OVER THE COUNTER MEDICATION    Sig: Take 3 capsules by mouth daily. Perque bone guard forte 20  . ciprofloxacin (CIPRO) 250 MG tablet    Sig: Take 1 tablet (250 mg total) by mouth 2 (two) times daily. For 3 days    Dispense:  6 tablet    Refill:  0    CMA served as scribe during this visit. History, Physical and Plan performed by medical provider. Documentation and orders reviewed and attested to.   YAnn Held DO

## 2016-11-17 ENCOUNTER — Encounter: Payer: Self-pay | Admitting: Family Medicine

## 2016-11-17 ENCOUNTER — Other Ambulatory Visit: Payer: Self-pay | Admitting: Family Medicine

## 2016-11-17 DIAGNOSIS — M47896 Other spondylosis, lumbar region: Secondary | ICD-10-CM

## 2016-11-17 NOTE — Telephone Encounter (Signed)
Requesting:  tramadol Contract   none UDS   None Last OV   06/16/2016 Last Refill     #30 with 1 refill on 3//31/2017  Please Advise   [facility]

## 2016-11-18 ENCOUNTER — Encounter: Payer: Self-pay | Admitting: Family Medicine

## 2016-11-18 LAB — URINE CULTURE: Colony Count: >=100000

## 2016-11-20 ENCOUNTER — Telehealth: Payer: Self-pay | Admitting: Family Medicine

## 2016-11-20 ENCOUNTER — Encounter: Payer: Self-pay | Admitting: Family Medicine

## 2016-11-20 ENCOUNTER — Other Ambulatory Visit: Payer: Self-pay | Admitting: Family Medicine

## 2016-11-20 DIAGNOSIS — M47896 Other spondylosis, lumbar region: Secondary | ICD-10-CM

## 2016-11-20 MED ORDER — TRAMADOL HCL 50 MG PO TABS: 50.0000 mg | ORAL_TABLET | Freq: Three times a day (TID) | ORAL | 1 refills | Status: DC | PRN

## 2016-11-20 NOTE — Telephone Encounter (Signed)
Patient Name: Maria [name]  DOB: [birth year]    Initial Comment Caller states she was given 3 day rx of Cipro for poss UTI, while awaiting lab results. No sx. Has questions.   Nurse Assessment  Nurse: Raphael Gibney, RN, Vanita Ingles Date/Time (Eastern Time): 11/20/2016 10:24:44 AM  Confirm and document reason for call. If symptomatic, describe symptoms. ---Caller states she had a urine culture in the office on Monday. Took cipro 250 mg BID x 3 days. Finished cipro last night. No burning or pain with urination. No fever or back pain.  Does the patient have any new or worsening symptoms? ---Yes  Will a triage be completed? ---Yes  Related visit to physician within the last 2 weeks? ---Yes  Does the PT have any chronic conditions? (i.e. diabetes, asthma, etc.) ---No  Is this a behavioral health or substance abuse call? ---No     Guidelines    Guideline Title Affirmed Question Affirmed Notes  Urinary Tract Infection on Antibiotic Follow-up Call - Female [1] Reasonable improvement on antibiotics AND [2] no fever (all triage questions negative)    Final Disposition User   [place], RN, Vanita Ingles    Disagree/Comply: Comply

## 2016-11-20 NOTE — Telephone Encounter (Signed)
That should have been all she needed-- does she have symptoms

## 2016-11-20 NOTE — Telephone Encounter (Signed)
Patient voiced that she initially thought that she was to take the antibiotic longer than 3 days, but since then has received clarification about medication. She reported completion of the antibiotic & no longer have any symptoms. No further concerns before call ended.

## 2016-11-23 MED ORDER — TRAMADOL HCL 50 MG PO TABS: 50.0000 mg | ORAL_TABLET | Freq: Three times a day (TID) | ORAL | 0 refills | Status: DC | PRN

## 2016-11-23 NOTE — Telephone Encounter (Signed)
Faxed hardcopy for Tramadol to [facility]

## 2016-11-23 NOTE — Telephone Encounter (Signed)
OK to refill tramadol with #30 but no refill needs appt to reassess need for refills if she wants more. She will need a UDS when she comes in next.

## 2016-11-23 NOTE — Addendum Note (Signed)
Addended by: Sharon Seller B on: 11/23/2016 03:30 PM   Modules accepted: Orders

## 2016-12-01 ENCOUNTER — Other Ambulatory Visit (INDEPENDENT_AMBULATORY_CARE_PROVIDER_SITE_OTHER): Payer: Medicare Other

## 2016-12-01 ENCOUNTER — Other Ambulatory Visit: Payer: Self-pay

## 2016-12-01 ENCOUNTER — Telehealth: Payer: Self-pay | Admitting: Family Medicine

## 2016-12-01 DIAGNOSIS — R8290 Unspecified abnormal findings in urine: Secondary | ICD-10-CM | POA: Diagnosis not present

## 2016-12-01 DIAGNOSIS — N390 Urinary tract infection, site not specified: Secondary | ICD-10-CM

## 2016-12-01 LAB — POC URINALSYSI DIPSTICK (AUTOMATED)
Bilirubin, UA: NEGATIVE
Blood, UA: NEGATIVE
Glucose, UA: NEGATIVE
Ketones, UA: NEGATIVE
Leukocytes, UA: NEGATIVE
Nitrite, UA: NEGATIVE
Protein, UA: NEGATIVE
Spec Grav, UA: 1.025
Urobilinogen, UA: NEGATIVE
pH, UA: 6

## 2016-12-01 NOTE — Telephone Encounter (Signed)
Caller name: Jaliah Relation to [id number] Call back number: Pharmacy:  Reason for call: Pt came in office stating had urine infection twice and that was needing in order for urine test in the lab. Pt needs order for urine test. Please advise.(ok per Charlett Blake)

## 2016-12-01 NOTE — Telephone Encounter (Signed)
An UA order was entered/ordered per Dr. Charlett Blake.

## 2017-02-11 ENCOUNTER — Encounter: Payer: Self-pay | Admitting: Neurology

## 2017-02-11 ENCOUNTER — Ambulatory Visit (INDEPENDENT_AMBULATORY_CARE_PROVIDER_SITE_OTHER): Payer: Medicare Other | Admitting: Neurology

## 2017-02-11 VITALS — BP 144/89 | HR 93 | Resp 20 | Ht 66.0 in | Wt 166.0 lb

## 2017-02-11 DIAGNOSIS — G4733 Obstructive sleep apnea (adult) (pediatric): Secondary | ICD-10-CM

## 2017-02-11 DIAGNOSIS — Z9989 Dependence on other enabling machines and devices: Secondary | ICD-10-CM | POA: Diagnosis not present

## 2017-02-11 NOTE — Patient Instructions (Signed)
Please continue using your CPAP regularly. While your insurance requires that you use CPAP at least 4 hours each night on 70% of the nights, I recommend, that you not skip any nights and use it throughout the night if you can. Getting used to CPAP and staying with the treatment long term does take time and patience and discipline. Untreated obstructive sleep apnea when it is moderate to severe can have an adverse impact on cardiovascular health and raise her risk for heart disease, arrhythmias, hypertension, congestive heart failure, stroke and diabetes. Untreated obstructive sleep apnea causes sleep disruption, nonrestorative sleep, and sleep deprivation. This can have an impact on your day to day functioning and cause daytime sleepiness and impairment of cognitive function, memory loss, mood disturbance, and problems focussing. Using CPAP regularly can improve these symptoms.  I hope, your left knee pain gets better.   Keep up the good work! I will see you back in 12 months for sleep apnea.

## 2017-02-11 NOTE — Progress Notes (Signed)
Subjective:    Patient ID: Maria [name] is a 81 y.o. female.  HPI     Interim history:   Maria [name] is a 81 year old right-handed lady with an underlying medical history non-small cell lung cancer, status post lobectomy in 2002, hypertension, depression, thyroid nodules, arthritis of both knees, status post right total knee arthroplasty, hyponatremia, hypokalemia, hyperlipidemia, vitamin D deficiency, osteoporosis, and overweight state, who presents for follow-up consultation of her sleep apnea, on CPAP therapy. The patient is unaccompanied today. I last saw her on 08/12/2016, at which time we talked about her sleep study results from her baseline sleep study from 05/18/2016 as well as her CPAP titration results from 06/05/2016. She was established on CPAP therapy and compliant with it. She reported that her regarding her sleep with more restful sleep, better sleep consolidation. She was planning to take her CPAP machine to her trip to [place]. Stays busy, particularly with her young grandchildren, her older son's kids (from his second marriage). She has 2 sons (in their 11s).  Today, 02/11/2017 (all dictated new, as well as above notes, some dictation done in note pad or Word, outside of chart, may appear as copied):   I reviewed her CPAP compliance data from 01/10/2017 through 02/08/2017, which is a total of 30 days, during which time she used her machine 29 days with percent used days greater than 4 hours at 93%, indicating excellent compliance with an average usage of 6 hours and 21 minutes, residual AHI 1.8 per hour, leak on the higher side for the 95th percentile at 23 L/m, pressure of 7 cm with EPR of 3. She reports tolerating CPAP rather well now and sleeps well with it. No issues, feels well. She uses nasal pillows. Has some nose and mouth dryness from time to time. Planning to take her machine to her trip to [place] in the summer. Has left knee pain, status post right knee  replacement surgery, has an appointment with Dr. Maureen Ralphs.   The patient's allergies, current medications, family history, past medical history, past social history, past surgical history and problem list were reviewed and updated as appropriate.   Previously (copied from previous notes for reference):   I first met her on 04/28/2016 at the request of her primary care physician, at which time she reported snoring and excessive daytime somnolence, morning headaches, sleep disruption and nonrestorative sleep. I invited her back for sleep study. She had a baseline sleep study, followed by a CPAP titration study.  Baseline sleep study from 05/18/2016 showed a sleep efficiency of 70.9%, latency to sleep was prolonged at 26 minutes and wake after sleep onset was increased at 91 minutes. She had an increased percentage of light stage sleep, slow-wave sleep was 30.5% and there was no REM sleep. She had occasional PVCs on EKG. She had mild snoring. Total AHI was elevated at 28.7 per hour. Average oxygen saturation was only 89%, she was treated with supplemental oxygen which was started and 11:55 PM. Based on her sleep-related complaints and her test results I invited her for a full night CPAP titration study.   She had her CPAP study on 06/05/2016. Sleep efficiency was 75.9%, sleep latency was 26 minutes and wake after sleep onset was 71.5 minutes. She had an increased percentage of slow-wave sleep and REM sleep was 6.4%, REM latency markedly prolonged at 226.5 minutes. She had no significant PLMS. CPAP was titrated from 5 cm to 7 cm. AHI was 0 per hour at the  final pressure. Average oxygen saturation was 92%, nadir was 87%. Time below 88% saturation was less than 1 minute. Based on her test results are prescribed CPAP therapy for home use.   I reviewed her CPAP compliance data from 07/12/2016 through 08/10/2016 which is a total of 30 days, during which time she used her machine 29 days with percent used days  greater than 4 hours at 93%, indicating excellent compliance with an average usage of 5 hours and 55 minutes, residual AHI 2.3 per hour, leak on the higher side with the 95th percentile at 22.4 L/m on a pressure of 7 cm with EPR of 3.   04/28/2016: She reports snoring, excessive daytime somnolence, nonrestorative sleep, morning headaches and sleep disruption. I reviewed your office note from 04/15/2016. She is s/p R TKA in 2015 and has L knee pain, s/p multiple knee injection, sees Dr. Maureen Ralphs.  She goes to bed around 9:30 to 10 PM. She has occasional morning headaches, not enough to take medication, but BP has been a little up in the AMs. She denies restless leg symptoms. She has a rise time around 5 AM. She goes to the bathroom once or twice per average night. She lives alone. She is divorced for about 41 years. She is retired from [employer]. She has 2 grown children and 6 grandchildren. She likes to travel. She stays very active, walks about 2 miles per day. She drinks alcohol occasionally, usually 2 cups of coffee per day, quit smoking in 1980. She recently started Cymbalta 30 mg once daily about 2 weeks ago and feels improved in her mood. She has no TV in the bedroom. She has a small dog sleeps at the end of the bed in a queen size bed. She is not aware of any family history of obstructive sleep apnea. Her main concern is that she is tired during the day and does not wake up rested. Her Epworth sleepiness score is 12 out of 24 today, her fatigue score is 33 out of 63.  His Past Medical History Is Significant For: Past Medical History:  Diagnosis Date  . Complication of anesthesia    slow to awaken  . Depression   . Diverticulosis   . Goiter, nodular    [place] of thyroid stable 7/08  . Heart murmur    due to rheumatic fever as child  . Mitral valve prolapse   . Multiple thyroid nodules 11/24/2015  . Neoplasm of lung, malignant ([facility]) 2002   Non-small cell  . Osteoarthritis 11/14/2013   2010  intra-articular steroids X 3 S/P Physical Therapy in 12/14 TKR 12/11/13   . Osteopenia    Dr Pamala Hurry  . PONV (postoperative nausea and vomiting)    n/v  . Vitamin D deficiency     His Past Surgical History Is Significant For: Past Surgical History:  Procedure Laterality Date  . ABDOMINAL HYSTERECTOMY  1998   No BSO, for dysfunctional menses  . BLADDER SUSPENSION Bilateral   . BREAST BIOPSY Right   . COLONOSCOPY  2009 , 2014   Diverticulosis; Dr. Olevia Perches  . LOBECTOMY  12/02   RU; no radiation or chemo, Dr. Arlyce Dice  . TONSILLECTOMY    . TOTAL KNEE ARTHROPLASTY Right 12/11/2013   Procedure: RIGHT TOTAL KNEE ARTHROPLASTY;  Surgeon: Gearlean Alf, MD;  Location: [facility];  Service: Orthopedics;  Laterality: Right;    His Family History Is Significant For: Family History  Problem Relation Age of Onset  . COPD Mother   .  Coronary artery disease Father   . Colon cancer Father 49  . Heart attack Father 44  . Stroke Father     > 32  . Lung cancer Paternal Aunt     smoker  . Cancer Paternal Aunt     lung  . Lung cancer Maternal Grandfather     smoker  . COPD Maternal Grandfather   . Arthritis Sister   . Lung cancer Maternal Uncle     smoker  . Diabetes Neg Hx     His Social History Is Significant For: Social History   Social History  . Marital status: Divorced    Spouse name: N/A  . Number of children: N/A  . Years of education: college   Occupational History  . Retired Retired   Social History Main Topics  . Smoking status: Former Smoker    Years: 20.00    Types: Cigarettes    Quit date: 05/03/1979  . Smokeless tobacco: Never Used     Comment: smoked 1962-1980, up to < 1 ppd  . Alcohol use 4.2 oz/week    7 Standard drinks or equivalent per week     Comment: occasional  . Drug use: No  . Sexual activity: No     Comment: lives alone, dog, no dietary restrictions, eating heart healthy   Other Topics Concern  . None   Social History Narrative   Pt gets regular  exercise.   Drinks 2 cups of coffee a day     His Allergies Are:  Allergies  Allergen Reactions  . Morphine And Related     Mental status change  . Oxycodone     Mental status changes post op 12/11/13  . Sulfonamide Derivatives     REACTION: stomach pain  :   His Current Medications Are:  Outpatient Encounter Prescriptions as of 02/11/2017  Medication Sig  . Cholecalciferol (VITAMIN D3) 50000 units TABS Take 50,000 Units by mouth once a week.  . DULoxetine (CYMBALTA) 30 MG capsule Take 1 capsule (30 mg total) by mouth daily. Increase to 2 pills daily after 2 weeks  . Levomefolic Acid (5-MTHF) 1 MG CAPS Take 1 capsule by mouth daily.  Marland Kitchen OVER THE COUNTER MEDICATION Take 1 capsule by mouth daily. Adenosyl/hyroxy b12 2070mg  . OVER THE COUNTER MEDICATION Take 1 capsule by mouth daily. Critical Omega  '1200mg'$  fish oil/'900mg'$  omega 3  . OVER THE COUNTER MEDICATION Take 3 capsules by mouth daily. Perque bone guard forte 20  . psyllium (METAMUCIL) 58.6 % powder Take 1 packet by mouth daily. 1 teaspoon  . traMADol (ULTRAM) 50 MG tablet Take 1 tablet (50 mg total) by mouth every 8 (eight) hours as needed.  . [DISCONTINUED] Cholecalciferol (VITAMIN D3) 5000 units CAPS Take 1 capsule by mouth every 30 (thirty) days. Sun-vit d3  . [DISCONTINUED] ciprofloxacin (CIPRO) 250 MG tablet Take 1 tablet (250 mg total) by mouth 2 (two) times daily. For 3 days  . [DISCONTINUED] traMADol (ULTRAM) 50 MG tablet Take 1 tablet (50 mg total) by mouth every 8 (eight) hours as needed.   No facility-administered encounter medications on file as of 02/11/2017.   :  Review of Systems:  Out of a complete 14 point review of systems, all are reviewed and negative with the exception of these symptoms as listed below: Review of Systems  Neurological:       Pt presents today to discuss her cpap. Pt tolerates the cpap much better now. She feels better during the day  after using her cpap.    Objective:  Neurologic  Exam  Physical Exam Physical Examination:   Vitals:   02/11/17 0942  BP: (!) 144/89  Pulse: 93  Resp: 20    General Examination: The patient is a very pleasant 81 y.o. female in no acute distress. He appears well-developed and well-nourished and well groomed. Good spirits.  HEENT: Normocephalic, atraumatic, pupils are equal, round and reactive to light and accommodation. Cataract repairs. Corrective eye glasses. Extraocular tracking is good without limitation to gaze excursion or nystagmus noted. Normal smooth pursuit is noted. Hearing is grossly intact. Face is symmetric with normal facial animation and normal facial sensation. Speech is clear with no dysarthria noted. There is no hypophonia. There is no lip, neck/head, jaw or voice tremor. Neck is supple with full range of passive and active motion. Oropharynx exam reveals: mild mouth dryness, adequate dental hygiene and moderate airway crowding. Mallampati is class II. Tongue protrudes centrally and palate elevates symmetrically. Tonsils are absent.   Chest: Clear to auscultation without wheezing, rhonchi or crackles noted.  Heart: S1+S2+0, regular and normal without murmurs, rubs or gallops noted.   Abdomen: Soft, non-tender and non-distended with normal bowel sounds appreciated on auscultation.  Extremities: There is no pitting edema in the distal lower extremities bilaterally. Pedal pulses are intact.  Skin: Warm and dry without trophic changes noted.  Musculoskeletal: exam reveals no obvious joint deformities, tenderness or joint swelling or erythema, with the exception of left knee swelling and pain. She is status post right knee replacement surgery.   Neurologically:  Mental status: The patient is awake, alert and oriented in all 4 spheres. Her immediate and remote memory, attention, language skills and fund of knowledge are appropriate. There is no evidence of aphasia, agnosia, apraxia or anomia. Speech is clear with normal  prosody and enunciation. Thought process is linear. Mood is normal and affect is normal.  Cranial nerves II - XII are as described above under HEENT exam. In addition: shoulder shrug is normal with equal shoulder height noted. Motor exam: Normal bulk, strength and tone is noted. There is no drift, tremor or rebound. Reflexes are 1+ throughout. Fine motor skills and coordination: intact.  Cerebellar testing: No dysmetria or intention tremor on finger to nose testing. Heel to shin is unremarkable bilaterally. There is no truncal or gait ataxia.  Sensory exam: intact to light touch in the upper and lower extremities.  Gait, station and balance: She stands with difficulty. Posture is stooped, she has lumbar kyphosis and probably some scoliosis. She walks with a limp on the left, she has no walking cane. Tandem walk is not possible, Romberg was not tested for safety.                Assessment and Plan:   In summary, Maria [name] is a very pleasant 81 y.o.-year old female with an underlying medical history non-small cell lung cancer, status post lobectomy in 2002, hypertension, depression, thyroid nodules, arthritis of both knees, status post right total knee arthroplasty, hyponatremia, hypokalemia, hyperlipidemia, vitamin D deficiency, osteoporosis, and overweight state, who presents for follow-up consultation of her sleep apnea, now well established on CPAP therapy at a pressure of 7 cm. She is tolerating the treatment, she feels well with regards to her sleep. She is reminded to make enough time for sleep. She does admit that she gets carried away watching the news sometimes. She is very active mentally and physically, involved in her grandchildren's care as well.  She will be traveling home to [place] soon. She has interim progressive issues with her left knee, has been status post injections, sees Dr. Maureen Ralphs for this.  She is commended for her CPAP adherence, her sleep study from 05/18/2016 showed  moderate to severe obstructive sleep apnea. At this juncture, I would like for her to come back on a yearly basis. I answered all her questions today and she was in agreement. I spent 25 minutes in total face-to-face time with the patient, more than 50% of which was spent in counseling and coordination of care, reviewing test results, reviewing medication and discussing or reviewing the diagnosis of OSA, its prognosis and treatment options. Pertinent laboratory and imaging test results that were available during this visit with the patient were reviewed by me and considered in my medical decision making (see chart for details).

## 2017-04-07 ENCOUNTER — Telehealth: Payer: Self-pay | Admitting: *Deleted

## 2017-04-07 NOTE — Telephone Encounter (Signed)
Received Medical/Surgical Clearance Form from [facility] with surgery date of 06/28/17, forwarded to provider/SLS 06/06  Please call patient and have her schedule Med/Surgical Clearance [30-minute] appointment within 30-days of her surgery date [06/28/17]/SLS 060/06 Thanks.

## 2017-04-08 NOTE — Telephone Encounter (Signed)
Patient would like to know if her physical scheduled for 06/18/17 can be her surgical clearance appointment. Patient states surgery date is scheduled for 06/28/17

## 2017-04-09 NOTE — Telephone Encounter (Signed)
Addendum to 06/18/17 office note, updated CPE to CPE/Medical Clearance for 06/28/17 surgery; forward to provider FYI/SLS 06/08

## 2017-05-04 ENCOUNTER — Telehealth: Payer: Self-pay | Admitting: Neurology

## 2017-05-04 ENCOUNTER — Encounter: Payer: Self-pay | Admitting: Family Medicine

## 2017-05-04 NOTE — Telephone Encounter (Signed)
Advised patient per previous message to contact her ortho doctor.regarding a clearance letter.

## 2017-05-04 NOTE — Telephone Encounter (Signed)
Ok to write this letter?

## 2017-05-04 NOTE — Telephone Encounter (Signed)
I called pt discuss. No answer, left a message asking her to call me back.

## 2017-05-04 NOTE — Telephone Encounter (Signed)
Pt needs clearance for left knee replacement 8/27 due to sleep apnea. She is still using CPAP. Please send to the patient, current address is confirmed

## 2017-05-04 NOTE — Telephone Encounter (Signed)
Please have her ortho fax a form for this. Normally, the surgeon sends a form for this purpose.

## 2017-05-05 ENCOUNTER — Other Ambulatory Visit: Payer: Self-pay | Admitting: Family Medicine

## 2017-05-05 MED ORDER — DULOXETINE HCL 30 MG PO CPEP: 30.0000 mg | ORAL_CAPSULE | Freq: Every day | ORAL | 1 refills | Status: DC

## 2017-06-03 ENCOUNTER — Ambulatory Visit: Payer: Self-pay | Admitting: Orthopedic Surgery

## 2017-06-03 NOTE — H&P (Signed)
[name] DOB: [birth year] Divorced / Language: Cleophus Molt / Race: White Female Date of Admission:  06/28/2017 CC:  Left knee pain History of Present Illness  The patient is a 81 year old female who comes in  for a preoperative History and Physical. The patient is scheduled for a left total knee arthroplasty to be performed by Dr. Dione Plover. Aluisio, MD at [facility] on 06/28/2017. The patient is a 81 year old female who presented for follow up of their knee. The patient is being followed for their left knee pain and osteoarthritis. They are month(s) out from cortisone injection. Symptoms reported include: pain, swelling, aching and giving way. The patient feels that they are doing poorly and report their pain level to be severe (pain comes and goes). The following medication has been used for pain control: antiinflammatory medication and Ultram (for back issues). The patient has not gotten any relief of their symptoms with Cortisone injections. HX of right TKA 2015 Unfortunately, left knee is getting progressively worse. Right knee did great after replacement. She has reached a point where she is ready to get the left knee replaced. They have been treated conservatively in the past for the above stated problem and despite conservative measures, they continue to have progressive pain and severe functional limitations and dysfunction. They have failed non-operative management including home exercise, medications, and injections. It is felt that they would benefit from undergoing total joint replacement. Risks and benefits of the procedure have been discussed with the patient and they elect to proceed with surgery. There are no active contraindications to surgery such as ongoing infection or rapidly progressive neurological disease.  Problem List/Past Medical  Chronic left shoulder pain (M25.512)  Primary osteoarthritis of left knee (M17.12)  Chronic Pain  Lung Cancer  Heart murmur   secondary to Rheumatic Fever as a child Right knee pain (M25.561)  Vertigo  Sleep Apnea  Diverticulosis  Osteoporosis  Measles  Mumps   Allergies Sulfanilamide *CHEMICALS*  Sickness  Family History Cancer  Father. Cerebrovascular Accident  Father. Heart Disease  Father. First Degree Relatives  reported Hypertension  Father. Osteoarthritis  Father, Sister. Chronic Obstructive Lung Disease  Mother. Heart disease in female family member before age 21  Father  Deceased. age 85; Heart Condition Mother  Deceased. age 93; COPD  Social History  Not under pain contract  Current work status  retired Tobacco / smoke exposure  09/21/2013: no Number of flights of stairs before winded  1 No history of drug/alcohol rehab  Marital status  divorced Living situation  live alone Current drinker  09/21/2013: Currently drinks wine less than 5 times per week Children  2 Exercise  Exercises daily; does running / walking Tobacco use  Former smoker. 09/21/2013: smoke(d) 1/2 pack(s) per day [place] or Rehab. Plan is to look into [place].  Medication History TraMADol HCl (50MG  Tablet, Oral) Active. Cymbalta (30MG  Capsule DR Part, Oral) Active. Fish Oil Burp-Less (Oral) Specific strength unknown - Active. Calcium Carbonate (Oral) Specific strength unknown - Active. B12-Active (1MG  Tablet Chewable, Oral) Active. Folic Acid (Oral) Specific strength unknown - Active. Vitamin D (Oral) Specific strength unknown - Active. Metamucil Clear & Natural (Oral) Active.   Past Surgical History Breast Biopsy  right Hysterectomy  partial (non-cancerous) Lung Surgery  right Cataract Extraction-Bilateral  Total Knee Replacement - Right    Review of Systems General Not Present- Chills, Fatigue, Fever, Memory Loss,  Night Sweats, Weight Gain and Weight Loss. Skin  Not Present- Eczema, Hives, Itching, Lesions and Rash. HEENT Not Present- Dentures, Double Vision, Headache, Hearing Loss, Tinnitus and Visual Loss. Respiratory Not Present- Allergies, Chronic Cough, Coughing up blood, Shortness of breath at rest and Shortness of breath with exertion. Cardiovascular Not Present- Chest Pain, Difficulty Breathing Lying Down, Murmur, Palpitations, Racing/skipping heartbeats and Swelling. Gastrointestinal Not Present- Abdominal Pain, Bloody Stool, Constipation, Diarrhea, Difficulty Swallowing, Heartburn, Jaundice, Loss of appetitie, Nausea and Vomiting. Female Genitourinary Not Present- Blood in Urine, Discharge, Flank Pain, Incontinence, Painful Urination, Urgency, Urinary frequency, Urinary Retention, Urinating at Night and Weak urinary stream. Musculoskeletal Present- Joint Pain. Not Present- Back Pain, Joint Swelling, Morning Stiffness, Muscle Pain, Muscle Weakness and Spasms. Neurological Not Present- Blackout spells, Difficulty with balance, Dizziness, Paralysis, Tremor and Weakness. Psychiatric Not Present- Insomnia.  Vitals Weight: 165 lb Height: 66in Body Surface Area: 1.84 m Body Mass Index: 26.63 kg/m  Pulse: 88 (Regular)  BP: 138/78 (Sitting, Right Arm, Standard)   Physical Exam General Mental Status -Alert, cooperative and good historian. General Appearance-pleasant, Not in acute distress. Orientation-Oriented X3. Build & Nutrition-Well nourished and Well developed.  Head and Neck Head-normocephalic, atraumatic . Neck Global Assessment - supple, no bruit auscultated on the right, no bruit auscultated on the left.  Eye Vision-Wears corrective lenses. Pupil - Bilateral-Regular and Round. Motion - Bilateral-EOMI.  Chest and Lung Exam Auscultation Breath sounds - clear at anterior chest wall and clear at posterior chest wall. Adventitious sounds - No Adventitious  sounds.  Cardiovascular Auscultation Rhythm - Regular rate and rhythm. Heart Sounds - S1 WNL and S2 WNL. Murmurs & Other Heart Sounds - Auscultation of the heart reveals - No Murmurs.  Abdomen Palpation/Percussion Tenderness - Abdomen is non-tender to palpation. Rigidity (guarding) - Abdomen is soft. Auscultation Auscultation of the abdomen reveals - Bowel sounds normal.  Female Genitourinary Note: Not done, not pertinent to present illness   Musculoskeletal Note: Her left knee shows no effusion. Her range of motion is about 5 to 120. There is marked crepitus on range of motion with tenderness medial greater than lateral with no instability noted.  RADIOGRAPHS We reviewed her radiographs. She does have bone on bone arthritis in the medial and patellofemoral compartments. The x-rays were taken today. Her right knee prosthesis is in excellent position with no abnormalities.  Assessment & Plan  Primary osteoarthritis of left knee (M17.12)  Note:Surgical Plans: Left Total Knee Replacement  Disposition: [facility]  PCP: Dr. Verlee Monte - pending at time of H&P  Topical TXA  Anesthesia Issues: None  Patient was instructed on what medications to stop prior to surgery.  Signed electronically by Joelene Millin, III PA-C

## 2017-06-03 NOTE — H&P (Signed)
[name] DOB: [birth year] Divorced / Language: Maria [name] / Race: White Female Date of Admission:  06/28/2017 CC:  Left knee pain History of Present Illness  The patient is a 81 year old female who comes in  for a preoperative History and Physical. The patient is scheduled for a left total knee arthroplasty to be performed by Dr. Dione Plover. Aluisio, MD at [facility] on 06/28/2017. The patient is a 81 year old female who presented for follow up of their knee. The patient is being followed for their left knee pain and osteoarthritis. They are month(s) out from cortisone injection. Symptoms reported include: pain, swelling, aching and giving way. The patient feels that they are doing poorly and report their pain level to be severe (pain comes and goes). The following medication has been used for pain control: antiinflammatory medication and Ultram (for back issues). The patient has not gotten any relief of their symptoms with Cortisone injections. HX of right TKA 2015 Unfortunately, left knee is getting progressively worse. Right knee did great after replacement. She has reached a point where she is ready to get the left knee replaced. They have been treated conservatively in the past for the above stated problem and despite conservative measures, they continue to have progressive pain and severe functional limitations and dysfunction. They have failed non-operative management including home exercise, medications, and injections. It is felt that they would benefit from undergoing total joint replacement. Risks and benefits of the procedure have been discussed with the patient and they elect to proceed with surgery. There are no active contraindications to surgery such as ongoing infection or rapidly progressive neurological disease.  Problem List/Past Medical  Chronic left shoulder pain (M25.512)  Primary osteoarthritis of left knee (M17.12)  Chronic Pain  Lung Cancer  Heart murmur   secondary to Rheumatic Fever as a child Right knee pain (M25.561)  Vertigo  Sleep Apnea  Diverticulosis  Osteoporosis  Measles  Mumps   Allergies Sulfanilamide *CHEMICALS*  Sickness  Family History Cancer  Father. Cerebrovascular Accident  Father. Heart Disease  Father. First Degree Relatives  reported Hypertension  Father. Osteoarthritis  Father, Sister. Chronic Obstructive Lung Disease  Mother. Heart disease in female family member before age 75  Father  Deceased. age 50; Heart Condition Mother  Deceased. age 71; COPD  Social History  Not under pain contract  Current work status  retired Tobacco / smoke exposure  09/21/2013: no Number of flights of stairs before winded  1 No history of drug/alcohol rehab  Marital status  divorced Living situation  live alone Current drinker  09/21/2013: Currently drinks wine less than 5 times per week Children  2 Exercise  Exercises daily; does running / walking Tobacco use  Former smoker. 09/21/2013: smoke(d) 1/2 pack(s) per day [place] or Rehab. Plan is to look into [facility].  Medication History TraMADol HCl (50MG  Tablet, Oral) Active. Cymbalta (30MG  Capsule DR Part, Oral) Active. Fish Oil Burp-Less (Oral) Specific strength unknown - Active. Calcium Carbonate (Oral) Specific strength unknown - Active. B12-Active (1MG  Tablet Chewable, Oral) Active. Folic Acid (Oral) Specific strength unknown - Active. Vitamin D (Oral) Specific strength unknown - Active. Metamucil Clear & Natural (Oral) Active.   Past Surgical History Breast Biopsy  right Hysterectomy  partial (non-cancerous) Lung Surgery  right Cataract Extraction-Bilateral  Total Knee Replacement - Right    Review of Systems General Not Present- Chills, Fatigue, Fever, Memory Loss,  Night Sweats, Weight Gain and Weight Loss. Skin Not  Present- Eczema, Hives, Itching, Lesions and Rash. HEENT Not Present- Dentures, Double Vision, Headache, Hearing Loss, Tinnitus and Visual Loss. Respiratory Not Present- Allergies, Chronic Cough, Coughing up blood, Shortness of breath at rest and Shortness of breath with exertion. Cardiovascular Not Present- Chest Pain, Difficulty Breathing Lying Down, Murmur, Palpitations, Racing/skipping heartbeats and Swelling. Gastrointestinal Not Present- Abdominal Pain, Bloody Stool, Constipation, Diarrhea, Difficulty Swallowing, Heartburn, Jaundice, Loss of appetitie, Nausea and Vomiting. Female Genitourinary Not Present- Blood in Urine, Discharge, Flank Pain, Incontinence, Painful Urination, Urgency, Urinary frequency, Urinary Retention, Urinating at Night and Weak urinary stream. Musculoskeletal Present- Joint Pain. Not Present- Back Pain, Joint Swelling, Morning Stiffness, Muscle Pain, Muscle Weakness and Spasms. Neurological Not Present- Blackout spells, Difficulty with balance, Dizziness, Paralysis, Tremor and Weakness. Psychiatric Not Present- Insomnia.  Vitals Weight: 165 lb Height: 66in Body Surface Area: 1.84 m Body Mass Index: 26.63 kg/m  Pulse: 88 (Regular)  BP: 138/78 (Sitting, Right Arm, Standard)   Physical Exam General Mental Status -Alert, cooperative and good historian. General Appearance-pleasant, Not in acute distress. Orientation-Oriented X3. Build & Nutrition-Well nourished and Well developed.  Head and Neck Head-normocephalic, atraumatic . Neck Global Assessment - supple, no bruit auscultated on the right, no bruit auscultated on the left.  Eye Vision-Wears corrective lenses. Pupil - Bilateral-Regular and Round. Motion - Bilateral-EOMI.  Chest and Lung Exam Auscultation Breath sounds - clear at anterior chest wall and clear at posterior chest wall. Adventitious sounds - No Adventitious sounds.  Cardiovascular Auscultation Rhythm - Regular  rate and rhythm. Heart Sounds - S1 WNL and S2 WNL. Murmurs & Other Heart Sounds - Auscultation of the heart reveals - No Murmurs.  Abdomen Palpation/Percussion Tenderness - Abdomen is non-tender to palpation. Rigidity (guarding) - Abdomen is soft. Auscultation Auscultation of the abdomen reveals - Bowel sounds normal.  Female Genitourinary Note: Not done, not pertinent to present illness   Musculoskeletal Note: Her left knee shows no effusion. Her range of motion is about 5 to 120. There is marked crepitus on range of motion with tenderness medial greater than lateral with no instability noted.  RADIOGRAPHS We reviewed her radiographs. She does have bone on bone arthritis in the medial and patellofemoral compartments. The x-rays were taken today. Her right knee prosthesis is in excellent position with no abnormalities.  Assessment & Plan  Primary osteoarthritis of left knee (M17.12)  Note:Surgical Plans: Left Total Knee Replacement  Disposition: [facility]  PCP: Dr. Verlee [name] - pending at time of H&P  Topical TXA  Anesthesia Issues: None  Patient was instructed on what medications to stop prior to surgery.  Signed electronically by Joelene Millin, III PA-C

## 2017-06-08 ENCOUNTER — Ambulatory Visit: Payer: Self-pay | Admitting: Orthopedic Surgery

## 2017-06-15 ENCOUNTER — Other Ambulatory Visit (HOSPITAL_COMMUNITY): Payer: Self-pay | Admitting: *Deleted

## 2017-06-15 NOTE — Progress Notes (Signed)
Chest xray 08-06-16 epic

## 2017-06-15 NOTE — Patient Instructions (Addendum)
Maria [name]  06/15/2017   Your procedure is scheduled on: 06-28-17  Report to [facility]  Entrance Report to admitting at  655 AM.  Report to admitting at AM   Call this number if you have problems the morning of surgery [phone]   Remember: ONLY 1 PERSON MAY GO WITH YOU TO SHORT STAY TO GET  READY MORNING OF Maria [name].  Do not eat food or drink liquids :After Midnight.                Bring cpap nasal tubing and mask     pillows and tubing  Take these medicines the morning of surgery with A SIP OF WATER: cymbalta                               You may not have any metal on your body including hair pins and              piercings  Do not wear jewelry, make-up, lotions, powders or perfumes, deodorant             Do not wear nail polish.  Do not shave  48 hours prior to surgery.              Men may shave face and neck.   Do not bring valuables to the hospital. [place].  Contacts, dentures or bridgework may not be worn into surgery.  Leave suitcase in the car. After surgery it may be brought to your room.                  Please read over the following fact sheets you were given: _____________________________________________________________________             [facility] - Preparing for Surgery Before surgery, you can play an important role.  Because skin is not sterile, your skin needs to be as free of germs as possible.  You can reduce the number of germs on your skin by washing with CHG (chlorahexidine gluconate) soap before surgery.  CHG is an antiseptic cleaner which kills germs and bonds with the skin to continue killing germs even after washing. Please DO NOT use if you have an allergy to CHG or antibacterial soaps.  If your skin becomes reddened/irritated stop using the CHG and inform your nurse when you arrive at Short Stay. Do not shave (including legs and underarms) for at  least 48 hours prior to the first CHG shower.  You may shave your face/neck. Please follow these instructions carefully:  1.  Shower with CHG Soap the night before surgery and the  morning of Surgery.  2.  If you choose to wash your hair, wash your hair first as usual with your  normal  shampoo.  3.  After you shampoo, rinse your hair and body thoroughly to remove the  shampoo.                           4.  Use CHG as you would any other liquid soap.  You can apply chg directly  to the skin and wash  Gently with a scrungie or clean washcloth.  5.  Apply the CHG Soap to your body ONLY FROM THE NECK DOWN.   Do not use on face/ open                           Wound or open sores. Avoid contact with eyes, ears mouth and genitals (private parts).                       Wash face,  Genitals (private parts) with your normal soap.             6.  Wash thoroughly, paying special attention to the area where your surgery  will be performed.  7.  Thoroughly rinse your body with warm water from the neck down.  8.  DO NOT shower/wash with your normal soap after using and rinsing off  the CHG Soap.                9.  Pat yourself dry with a clean towel.            10.  Wear clean pajamas.            11.  Place clean sheets on your bed the night of your first shower and do not  sleep with pets. Day of Surgery : Do not apply any lotions/deodorants the morning of surgery.  Please wear clean clothes to the hospital/surgery center.  FAILURE TO FOLLOW THESE INSTRUCTIONS MAY RESULT IN THE CANCELLATION OF YOUR SURGERY PATIENT SIGNATURE_________________________________  NURSE SIGNATURE__________________________________  ________________________________________________________________________   Adam Phenix  An incentive spirometer is a tool that can help keep your lungs clear and active. This tool measures how well you are filling your lungs with each breath. Taking long deep breaths  may help reverse or decrease the chance of developing breathing (pulmonary) problems (especially infection) following:  A long period of time when you are unable to move or be active. BEFORE THE PROCEDURE   If the spirometer includes an indicator to show your best effort, your nurse or respiratory therapist will set it to a desired goal.  If possible, sit up straight or lean slightly forward. Try not to slouch.  Hold the incentive spirometer in an upright position. INSTRUCTIONS FOR USE  1. Sit on the edge of your bed if possible, or sit up as far as you can in bed or on a chair. 2. Hold the incentive spirometer in an upright position. 3. Breathe out normally. 4. Place the mouthpiece in your mouth and seal your lips tightly around it. 5. Breathe in slowly and as deeply as possible, raising the piston or the ball toward the top of the column. 6. Hold your breath for 3-5 seconds or for as long as possible. Allow the piston or ball to fall to the bottom of the column. 7. Remove the mouthpiece from your mouth and breathe out normally. 8. Rest for a few seconds and repeat Steps 1 through 7 at least 10 times every 1-2 hours when you are awake. Take your time and take a few normal breaths between deep breaths. 9. The spirometer may include an indicator to show your best effort. Use the indicator as a goal to work toward during each repetition. 10. After each set of 10 deep breaths, practice coughing to be sure your lungs are clear. If you have an incision (the cut made at the time of surgery),  support your incision when coughing by placing a pillow or rolled up towels firmly against it. Once you are able to get out of bed, walk around indoors and cough well. You may stop using the incentive spirometer when instructed by your caregiver.  RISKS AND COMPLICATIONS  Take your time so you do not get dizzy or light-headed.  If you are in pain, you may need to take or ask for pain medication before doing  incentive spirometry. It is harder to take a deep breath if you are having pain. AFTER USE  Rest and breathe slowly and easily.  It can be helpful to keep track of a log of your progress. Your caregiver can provide you with a simple table to help with this. If you are using the spirometer at home, follow these instructions: [place] IF:   You are having difficultly using the spirometer.  You have trouble using the spirometer as often as instructed.  Your pain medication is not giving enough relief while using the spirometer.  You develop fever of 100.5 F (38.1 C) or higher. SEEK IMMEDIATE MEDICAL CARE IF:   You cough up bloody sputum that had not been present before.  You develop fever of 102 F (38.9 C) or greater.  You develop worsening pain at or near the incision site. MAKE SURE YOU:   Understand these instructions.  Will watch your condition.  Will get help right away if you are not doing well or get worse. Document Released: 03/01/2007 Document Revised: 01/11/2012 Document Reviewed: 05/02/2007 ExitCare Patient Information 2014 Exit[employer], [place].   ________________________________________________________________________  WHAT IS A BLOOD TRANSFUSION? Blood Transfusion Information  A transfusion is the replacement of blood or some of its parts. Blood is made up of multiple cells which provide different functions.  Red blood cells carry oxygen and are used for blood loss replacement.  White blood cells fight against infection.  Platelets control bleeding.  Plasma helps clot blood.  Other blood products are available for specialized needs, such as hemophilia or other clotting disorders. BEFORE THE TRANSFUSION  Who gives blood for transfusions?   Healthy volunteers who are fully evaluated to make sure their blood is safe. This is blood bank blood. Transfusion therapy is the safest it has ever been in the practice of medicine. Before blood is taken from a  donor, a complete history is taken to make sure that person has no history of diseases nor engages in risky social behavior (examples are intravenous drug use or sexual activity with multiple partners). The donor's travel history is screened to minimize risk of transmitting infections, such as malaria. The donated blood is tested for signs of infectious diseases, such as HIV and hepatitis. The blood is then tested to be sure it is compatible with you in order to minimize the chance of a transfusion reaction. If you or a relative donates blood, this is often done in anticipation of surgery and is not appropriate for emergency situations. It takes many days to process the donated blood. RISKS AND COMPLICATIONS Although transfusion therapy is very safe and saves many lives, the main dangers of transfusion include:   Getting an infectious disease.  Developing a transfusion reaction. This is an allergic reaction to something in the blood you were given. Every precaution is taken to prevent this. The decision to have a blood transfusion has been considered carefully by your caregiver before blood is given. Blood is not given unless the benefits outweigh the risks. AFTER THE TRANSFUSION  Right after receiving a blood transfusion, you will usually feel much better and more energetic. This is especially true if your red blood cells have gotten low (anemic). The transfusion raises the level of the red blood cells which carry oxygen, and this usually causes an energy increase.  The nurse administering the transfusion will monitor you carefully for complications. HOME CARE INSTRUCTIONS  No special instructions are needed after a transfusion. You may find your energy is better. Speak with your caregiver about any limitations on activity for underlying diseases you may have. SEEK MEDICAL CARE IF:   Your condition is not improving after your transfusion.  You develop redness or irritation at the intravenous (IV)  site. SEEK IMMEDIATE MEDICAL CARE IF:  Any of the following symptoms occur over the next 12 hours:  Shaking chills.  You have a temperature by mouth above 102 F (38.9 C), not controlled by medicine.  Chest, back, or muscle pain.  People around you feel you are not acting correctly or are confused.  Shortness of breath or difficulty breathing.  Dizziness and fainting.  You get a rash or develop hives.  You have a decrease in urine output.  Your urine turns a dark color or changes to pink, red, or brown. Any of the following symptoms occur over the next 10 days:  You have a temperature by mouth above 102 F (38.9 C), not controlled by medicine.  Shortness of breath.  Weakness after normal activity.  The white part of the eye turns yellow (jaundice).  You have a decrease in the amount of urine or are urinating less often.  Your urine turns a dark color or changes to pink, red, or brown. Document Released: 10/16/2000 Document Revised: 01/11/2012 Document Reviewed: 06/04/2008 [facility] Patient Information 2014 [place], [place].  _______________________________________________________________________

## 2017-06-17 ENCOUNTER — Ambulatory Visit (INDEPENDENT_AMBULATORY_CARE_PROVIDER_SITE_OTHER): Payer: Medicare Other | Admitting: Family Medicine

## 2017-06-17 ENCOUNTER — Encounter (HOSPITAL_COMMUNITY): Payer: Self-pay

## 2017-06-17 ENCOUNTER — Encounter (HOSPITAL_COMMUNITY)
Admission: RE | Admit: 2017-06-17 | Discharge: 2017-06-17 | Disposition: A | Discharge status: Home / Self Care | Payer: Medicare Other | Source: Ambulatory Visit | Attending: Orthopedic Surgery | Admitting: Orthopedic Surgery

## 2017-06-17 ENCOUNTER — Ambulatory Visit: Payer: Self-pay | Admitting: Orthopedic Surgery

## 2017-06-17 ENCOUNTER — Encounter: Payer: Self-pay | Admitting: Family Medicine

## 2017-06-17 ENCOUNTER — Encounter: Payer: Medicare Other | Admitting: Family Medicine

## 2017-06-17 VITALS — BP 136/86 | HR 74 | Temp 98.3°F | Resp 18 | Ht 66.0 in | Wt 165.6 lb

## 2017-06-17 DIAGNOSIS — N390 Urinary tract infection, site not specified: Secondary | ICD-10-CM

## 2017-06-17 DIAGNOSIS — M1712 Unilateral primary osteoarthritis, left knee: Secondary | ICD-10-CM | POA: Diagnosis not present

## 2017-06-17 DIAGNOSIS — Z01818 Encounter for other preprocedural examination: Secondary | ICD-10-CM | POA: Diagnosis not present

## 2017-06-17 DIAGNOSIS — Z0000 Encounter for general adult medical examination without abnormal findings: Secondary | ICD-10-CM

## 2017-06-17 HISTORY — DX: Sleep apnea, unspecified: G47.30

## 2017-06-17 HISTORY — DX: Headache, unspecified: R51.9

## 2017-06-17 HISTORY — DX: Headache: R51

## 2017-06-17 LAB — COMPREHENSIVE METABOLIC PANEL
ALT: 20 U/L (ref 14–54)
AST: 29 U/L (ref 15–41)
Albumin: 4.3 g/dL (ref 3.5–5.0)
Alkaline Phosphatase: 70 U/L (ref 38–126)
Anion gap: 6 (ref 5–15)
BUN: 14 mg/dL (ref 6–20)
CO2: 32 mmol/L (ref 22–32)
Calcium: 9.2 mg/dL (ref 8.9–10.3)
Chloride: 102 mmol/L (ref 101–111)
Creatinine, Ser: 0.71 mg/dL (ref 0.44–1.00)
GFR calc Af Amer: >60 mL/min (ref >60)
GFR calc non Af Amer: >60 mL/min (ref >60)
Glucose, Bld: 114 mg/dL — ABNORMAL HIGH (ref 65–99)
Potassium: 3.6 mmol/L (ref 3.5–5.1)
Sodium: 140 mmol/L (ref 135–145)
Total Bilirubin: 0.9 mg/dL (ref 0.3–1.2)
Total Protein: 6.8 g/dL (ref 6.5–8.1)

## 2017-06-17 LAB — URINALYSIS, ROUTINE W REFLEX MICROSCOPIC
Bilirubin Urine: NEGATIVE
Glucose, UA: NEGATIVE mg/dL
Hgb urine dipstick: NEGATIVE
Ketones, ur: 5 mg/dL — AB
Nitrite: NEGATIVE
Protein, ur: NEGATIVE mg/dL
RBC / HPF: NONE SEEN RBC/hpf (ref 0–5)
Specific Gravity, Urine: 1.01 (ref 1.005–1.030)
Squamous Epithelial / LPF: NONE SEEN
pH: 7 (ref 5.0–8.0)

## 2017-06-17 LAB — SURGICAL PCR SCREEN
MRSA, PCR: NEGATIVE
Staphylococcus aureus: NEGATIVE

## 2017-06-17 LAB — APTT: aPTT: 34 seconds (ref 24–36)

## 2017-06-17 LAB — CBC
HCT: 44.1 % (ref 36.0–46.0)
Hemoglobin: 15.1 g/dL — ABNORMAL HIGH (ref 12.0–15.0)
MCH: 31.3 pg (ref 26.0–34.0)
MCHC: 34.2 g/dL (ref 30.0–36.0)
MCV: 91.5 fL (ref 78.0–100.0)
Platelets: 249 10*3/uL (ref 150–400)
RBC: 4.82 MIL/uL (ref 3.87–5.11)
RDW: 12.8 % (ref 11.5–15.5)
WBC: 7.3 10*3/uL (ref 4.0–10.5)

## 2017-06-17 LAB — PROTIME-INR
INR: 0.95
Prothrombin Time: 12.6 seconds (ref 11.4–15.2)

## 2017-06-17 MED ORDER — TRAMADOL HCL 50 MG PO TABS: 50.0000 mg | ORAL_TABLET | Freq: Three times a day (TID) | ORAL | 0 refills | Status: DC | PRN

## 2017-06-17 NOTE — Patient Instructions (Addendum)
Encouraged increased hydration and fiber in diet. Daily probiotics. If bowels not moving can use MOM 2 tbls po in 4 oz of warm prune juice by mouth every 2-3 days. If no results then repeat in 4 hours with  Dulcolax suppository pr, may repeat again in 4 more hours as needed. Seek care if symptoms worsen. Consider daily Miralax and/or Dulcolax if symptoms persist.   Miralax and benefiber once or twice a day Probiotics daily such as the [place] 10 strain probiotic daily can get at [employer] [place] or at [email or web address] Shingrix is the new shingles shot 2 shots over 6 months, check with insurance and make sure they will cover  Arthritis Arthritis means joint pain. It can also mean joint disease. A joint is a place where bones come together. People who have arthritis may have:  Red joints.  Swollen joints.  Stiff joints.  Warm joints.  A fever.  A feeling of being sick.  Follow these instructions at home: Pay attention to any changes in your symptoms. Take these actions to help with your pain and swelling. Medicines  Take over-the-counter and prescription medicines only as told by your doctor.  Do not take aspirin for pain if your doctor says that you may have gout. Activity  Rest your joint if your doctor tells you to.  Avoid activities that make the pain worse.  Exercise your joint regularly as told by your doctor. Try doing exercises like: ? Swimming. ? Water aerobics. ? Biking. ? Walking. Joint Care   If your joint is swollen, keep it raised (elevated) if told by your doctor.  If your joint feels stiff in the morning, try taking a warm shower.  If you have diabetes, do not apply heat without asking your doctor.  If told, apply heat to the joint: ? Put a towel between the joint and the hot pack or heating pad. ? Leave the heat on the area for 20-30 minutes.  If told, apply ice to the joint: ? Put ice in a plastic bag. ? Place a towel between your skin and  the bag. ? Leave the ice on for 20 minutes, 2-3 times per day.  Keep all follow-up visits as told by your doctor. Contact a doctor if:  The pain gets worse.  You have a fever. Get help right away if:  You have very bad pain in your joint.  You have swelling in your joint.  Your joint is red.  Many joints become painful and swollen.  You have very bad back pain.  Your leg is very weak.  You cannot control your pee (urine) or poop (stool). This information is not intended to replace advice given to you by your health care provider. Make sure you discuss any questions you have with your health care provider. Document Released: 01/13/2010 Document Revised: 03/26/2016 Document Reviewed: 01/14/2015 Elsevier Interactive Patient Education  [employer].

## 2017-06-17 NOTE — Progress Notes (Signed)
Subjective:  I acted as a Education administrator for Dr. Charlett Blake. Aiden Rao, [place]  Patient ID: Maria [name], female    DOB: [birth year], 81 y.o.   MRN: [id number]  No chief complaint on file.   HPI  Patient is in today for an annual exam. Patient will have knee replacement surgery next week on her left knee. No recent febrile illness or acute hospitalizations. Denies CP/palp/SOB/HA/congestion/fevers/GI or GU c/o. Taking meds as prescribed    Patient Care Team: Mosie Lukes, MD as PCP - General (Family Medicine) Gaynelle Arabian, MD as Consulting Physician (Orthopedic Surgery) Crista Luria, MD as Consulting Physician (Dermatology) Aloha Gell, MD as Consulting Physician (Obstetrics and Gynecology) Star Age, MD as Consulting Physician (Neurology)   Past Medical History:  Diagnosis Date  . Complication of anesthesia    slow to awaken  . Depression   . Diverticulosis   . Goiter, nodular    [place] of thyroid stable 7/08  . Heart murmur    due to rheumatic fever as child  . Mitral valve prolapse   . Multiple thyroid nodules 11/24/2015  . Neoplasm of lung, malignant ([place]) 2002   Non-small cell  . Osteoarthritis 11/14/2013   2010 intra-articular steroids X 3 S/P Physical Therapy in 12/14 TKR 12/11/13   . Osteopenia    Dr Pamala Hurry  . PONV (postoperative nausea and vomiting)    n/v  . Vitamin D deficiency     Past Surgical History:  Procedure Laterality Date  . ABDOMINAL HYSTERECTOMY  1998   No BSO, for dysfunctional menses  . BLADDER SUSPENSION Bilateral   . BREAST BIOPSY Right   . COLONOSCOPY  2009 , 2014   Diverticulosis; Dr. Olevia Perches  . LOBECTOMY  12/02   RU; no radiation or chemo, Dr. Arlyce Dice  . TONSILLECTOMY    . TOTAL KNEE ARTHROPLASTY Right 12/11/2013   Procedure: RIGHT TOTAL KNEE ARTHROPLASTY;  Surgeon: Gearlean Alf, MD;  Location: [facility];  Service: Orthopedics;  Laterality: Right;    Family History  Problem Relation Age of Onset  . COPD Mother   . Coronary artery disease  Father   . Colon cancer Father 58  . Heart attack Father 51  . Stroke Father        > 88  . Lung cancer Paternal Aunt        smoker  . Cancer Paternal Aunt        lung  . Lung cancer Maternal Grandfather        smoker  . COPD Maternal Grandfather   . Arthritis Sister   . Lung cancer Maternal Uncle        smoker  . Diabetes Neg Hx     Social History   Social History  . Marital status: Divorced    Spouse name: N/A  . Number of children: N/A  . Years of education: college   Occupational History  . Retired Retired   Social History Main Topics  . Smoking status: Former Smoker    Years: 20.00    Types: Cigarettes    Quit date: 05/03/1979  . Smokeless tobacco: Never Used     Comment: smoked 1962-1980, up to < 1 ppd  . Alcohol use 4.2 oz/week    7 Standard drinks or equivalent per week     Comment: occasional  . Drug use: No  . Sexual activity: No     Comment: lives alone, dog, no dietary restrictions, eating heart healthy   Other Topics Concern  .  Not on file   Social History Narrative   Pt gets regular exercise.   Drinks 2 cups of coffee a day     Outpatient Medications Prior to Visit  Medication Sig Dispense Refill  . Cholecalciferol (VITAMIN D3) 50000 units TABS Take 50,000 Units by mouth once a week.    . DULoxetine (CYMBALTA) 30 MG capsule Take 1 capsule (30 mg total) by mouth daily. Increase to 2 pills daily after 2 weeks 30 capsule 1  . Levomefolic Acid (5-MTHF) 1 MG CAPS Take 1 capsule by mouth daily.    Marland Kitchen OVER THE COUNTER MEDICATION Take 1 capsule by mouth daily. Adenosyl/hyroxy b12 2022mcg    . OVER THE COUNTER MEDICATION Take 1 capsule by mouth daily. Critical Omega  1200mg  fish oil/900mg  omega 3    . OVER THE COUNTER MEDICATION Take 3 capsules by mouth daily. Perque bone guard forte 20    . psyllium (METAMUCIL) 58.6 % powder Take 1 packet by mouth daily. 1 teaspoon    . traMADol (ULTRAM) 50 MG tablet Take 1 tablet (50 mg total) by mouth every 8 (eight)  hours as needed. 30 tablet 0   No facility-administered medications prior to visit.     Allergies  Allergen Reactions  . Morphine And Related     Mental status change  . Oxycodone     Mental status changes post op 12/11/13  . Sulfonamide Derivatives     REACTION: stomach pain    Review of Systems  Constitutional: Negative for fever and malaise/fatigue.  HENT: Negative for congestion.   Eyes: Negative for blurred vision.  Respiratory: Negative for cough and shortness of breath.   Cardiovascular: Negative for chest pain, palpitations and leg swelling.  Gastrointestinal: Negative for vomiting.  Musculoskeletal: Negative for back pain.  Skin: Negative for rash.  Neurological: Negative for loss of consciousness and headaches.       Objective:    Physical Exam  Constitutional: She is oriented to person, place, and time. She appears well-developed and well-nourished. No distress.  HENT:  Head: Normocephalic and atraumatic.  Eyes: Conjunctivae are normal.  Neck: Normal range of motion. No thyromegaly present.  Cardiovascular: Normal rate and regular rhythm.   Pulmonary/Chest: Effort normal and breath sounds normal. She has no wheezes.  Abdominal: Soft. Bowel sounds are normal. There is no tenderness.  Musculoskeletal: Normal range of motion. She exhibits no edema or deformity.  Neurological: She is alert and oriented to person, place, and time.  Skin: Skin is warm and dry. She is not diaphoretic.  Psychiatric: She has a normal mood and affect.    BP 136/86 (BP Location: Left Arm, Patient Position: Sitting, Cuff Size: Normal)   Pulse 74   Temp 98.3 F (36.8 C) (Oral)   Resp 18   Ht 5\' 6"  (1.676 m)   Wt 165 lb 9.6 oz (75.1 kg)   SpO2 98%   BMI 26.73 kg/m  Wt Readings from Last 3 Encounters:  06/17/17 165 lb 9.6 oz (75.1 kg)  02/11/17 166 lb (75.3 kg)  11/16/16 166 lb 3.2 oz (75.4 kg)   BP Readings from Last 3 Encounters:  06/17/17 136/86  02/11/17 (!) 144/89    11/16/16 136/80     Immunization History  Administered Date(s) Administered  . Pneumococcal Conjugate-13 11/15/2015  . Pneumococcal Polysaccharide-23 11/21/2013  . Tdap 11/14/2013  . Zoster 07/17/2014    Health Maintenance  Topic Date Due  . INFLUENZA VACCINE  06/02/2017  . COLONOSCOPY  05/12/2018  .  TETANUS/TDAP  11/15/2023  . DEXA SCAN  Completed  . PNA vac Low Risk Adult  Completed    Lab Results  Component Value Date   WBC 7.6 06/16/2016   HGB 15.5 (H) 06/16/2016   HCT 45.3 06/16/2016   PLT 270.0 06/16/2016   GLUCOSE 103 (H) 06/16/2016   CHOL 223 (H) 06/16/2016   TRIG 124.0 06/16/2016   HDL 71.20 06/16/2016   LDLDIRECT 137.2 10/23/2008   LDLCALC 127 (H) 06/16/2016   ALT 20 06/16/2016   AST 28 06/16/2016   NA 139 06/16/2016   K 3.6 06/16/2016   CL 100 06/16/2016   CREATININE 0.68 06/16/2016   BUN 15 06/16/2016   CO2 31 06/16/2016   TSH 2.10 06/16/2016   INR 0.91 12/04/2013   HGBA1C 5.6 02/13/2014    Lab Results  Component Value Date   TSH 2.10 06/16/2016   Lab Results  Component Value Date   WBC 7.6 06/16/2016   HGB 15.5 (H) 06/16/2016   HCT 45.3 06/16/2016   MCV 91.0 06/16/2016   PLT 270.0 06/16/2016   Lab Results  Component Value Date   NA 139 06/16/2016   K 3.6 06/16/2016   CO2 31 06/16/2016   GLUCOSE 103 (H) 06/16/2016   BUN 15 06/16/2016   CREATININE 0.68 06/16/2016   BILITOT 1.0 06/16/2016   ALKPHOS 77 06/16/2016   AST 28 06/16/2016   ALT 20 06/16/2016   PROT 7.0 06/16/2016   ALBUMIN 4.4 06/16/2016   CALCIUM 9.6 06/16/2016   GFR 88.82 06/16/2016   Lab Results  Component Value Date   CHOL 223 (H) 06/16/2016   Lab Results  Component Value Date   HDL 71.20 06/16/2016   Lab Results  Component Value Date   LDLCALC 127 (H) 06/16/2016   Lab Results  Component Value Date   TRIG 124.0 06/16/2016   Lab Results  Component Value Date   CHOLHDL 3 06/16/2016   Lab Results  Component Value Date   HGBA1C 5.6 02/13/2014          Assessment & Plan:   Problem List Items Addressed This Visit    None      I am having Ms. Waltner maintain her psyllium, Levomefolic Acid, OVER THE COUNTER MEDICATION, OVER THE COUNTER MEDICATION, OVER THE COUNTER MEDICATION, traMADol, Vitamin D3, and DULoxetine.  No orders of the defined types were placed in this encounter.   CMA served as Education administrator during this visit. History, Physical and Plan performed by medical provider. Documentation and orders reviewed and attested to.  Magdalene Molly, [place]

## 2017-06-18 ENCOUNTER — Encounter: Payer: Medicare Other | Admitting: Family Medicine

## 2017-06-18 NOTE — Progress Notes (Signed)
Urinalysis report sent to stacy blyth md and dr Dawayne Cirri

## 2017-06-18 NOTE — Progress Notes (Signed)
Spoke with dr hodierene anesthesia and made aware patient ekg 06-17-17 and patient medical history and old ekg from 04-27-2009, patient ok for surgery 06-28-17 per dr hodierne anesthesia.

## 2017-06-19 ENCOUNTER — Other Ambulatory Visit: Payer: Self-pay | Admitting: Family Medicine

## 2017-06-28 ENCOUNTER — Inpatient Hospital Stay (HOSPITAL_COMMUNITY)
Admission: RE | Admit: 2017-06-28 | Discharge: 2017-06-30 | DRG: 470 | Disposition: A | Discharge status: Skilled Nursing Facility | Payer: Medicare Other | Source: Ambulatory Visit | Attending: Orthopedic Surgery | Admitting: Orthopedic Surgery

## 2017-06-28 ENCOUNTER — Inpatient Hospital Stay (HOSPITAL_COMMUNITY): Payer: Medicare Other | Admitting: Certified Registered Nurse Anesthetist

## 2017-06-28 ENCOUNTER — Encounter (HOSPITAL_COMMUNITY)
Admission: RE | Disposition: A | Discharge status: Skilled Nursing Facility | Payer: Self-pay | Source: Ambulatory Visit | Attending: Orthopedic Surgery

## 2017-06-28 ENCOUNTER — Encounter (HOSPITAL_COMMUNITY): Payer: Self-pay | Admitting: *Deleted

## 2017-06-28 DIAGNOSIS — Z96651 Presence of right artificial knee joint: Secondary | ICD-10-CM | POA: Diagnosis present

## 2017-06-28 DIAGNOSIS — M810 Age-related osteoporosis without current pathological fracture: Secondary | ICD-10-CM | POA: Diagnosis present

## 2017-06-28 DIAGNOSIS — Z85118 Personal history of other malignant neoplasm of bronchus and lung: Secondary | ICD-10-CM | POA: Diagnosis not present

## 2017-06-28 DIAGNOSIS — F329 Major depressive disorder, single episode, unspecified: Secondary | ICD-10-CM | POA: Diagnosis present

## 2017-06-28 DIAGNOSIS — M179 Osteoarthritis of knee, unspecified: Secondary | ICD-10-CM

## 2017-06-28 DIAGNOSIS — Z79899 Other long term (current) drug therapy: Secondary | ICD-10-CM

## 2017-06-28 DIAGNOSIS — M1712 Unilateral primary osteoarthritis, left knee: Secondary | ICD-10-CM | POA: Diagnosis present

## 2017-06-28 DIAGNOSIS — Z9842 Cataract extraction status, left eye: Secondary | ICD-10-CM | POA: Diagnosis not present

## 2017-06-28 DIAGNOSIS — Z87891 Personal history of nicotine dependence: Secondary | ICD-10-CM

## 2017-06-28 DIAGNOSIS — M25562 Pain in left knee: Secondary | ICD-10-CM | POA: Diagnosis present

## 2017-06-28 DIAGNOSIS — G4730 Sleep apnea, unspecified: Secondary | ICD-10-CM | POA: Diagnosis present

## 2017-06-28 DIAGNOSIS — I341 Nonrheumatic mitral (valve) prolapse: Secondary | ICD-10-CM | POA: Diagnosis present

## 2017-06-28 DIAGNOSIS — G8929 Other chronic pain: Secondary | ICD-10-CM | POA: Diagnosis present

## 2017-06-28 DIAGNOSIS — Z9841 Cataract extraction status, right eye: Secondary | ICD-10-CM | POA: Diagnosis not present

## 2017-06-28 DIAGNOSIS — M1710 Unilateral primary osteoarthritis, unspecified knee: Secondary | ICD-10-CM

## 2017-06-28 HISTORY — PX: TOTAL KNEE ARTHROPLASTY: SHX125

## 2017-06-28 LAB — TYPE AND SCREEN
ABO/RH(D): O POS
Antibody Screen: NEGATIVE

## 2017-06-28 SURGERY — ARTHROPLASTY, KNEE, TOTAL
Anesthesia: Spinal | Site: Knee | Laterality: Left

## 2017-06-28 MED ORDER — CEFAZOLIN SODIUM-DEXTROSE 2-4 GM/100ML-% IV SOLN
2.0000 g | INTRAVENOUS | Status: AC
  Administered 2017-06-28: 2 g via INTRAVENOUS

## 2017-06-28 MED ORDER — ONDANSETRON HCL 4 MG/2ML IJ SOLN: 4.0000 mg | Freq: Four times a day (QID) | INTRAMUSCULAR | Status: DC | PRN

## 2017-06-28 MED ORDER — ONDANSETRON HCL 4 MG/2ML IJ SOLN
INTRAMUSCULAR | Status: DC | PRN
  Administered 2017-06-28: 4 mg via INTRAVENOUS

## 2017-06-28 MED ORDER — BISACODYL 10 MG RE SUPP: 10.0000 mg | Freq: Every day | RECTAL | Status: DC | PRN

## 2017-06-28 MED ORDER — POLYETHYLENE GLYCOL 3350 17 G PO PACK: 17.0000 g | PACK | Freq: Every day | ORAL | Status: DC | PRN

## 2017-06-28 MED ORDER — METOCLOPRAMIDE HCL 5 MG/ML IJ SOLN: 5.0000 mg | Freq: Three times a day (TID) | INTRAMUSCULAR | Status: DC | PRN

## 2017-06-28 MED ORDER — HYDROMORPHONE HCL-NACL 0.5-0.9 MG/ML-% IV SOSY
0.5000 mg | PREFILLED_SYRINGE | INTRAVENOUS | Status: DC | PRN
  Administered 2017-06-28 – 2017-06-29 (×2): 0.5 mg via INTRAVENOUS
  Filled 2017-06-28 (×2): qty 1

## 2017-06-28 MED ORDER — ONDANSETRON HCL 4 MG/2ML IJ SOLN
INTRAMUSCULAR | Status: AC
  Filled 2017-06-28: qty 2

## 2017-06-28 MED ORDER — RIVAROXABAN 10 MG PO TABS
10.0000 mg | ORAL_TABLET | Freq: Every day | ORAL | Status: DC
  Administered 2017-06-29 – 2017-06-30 (×2): 10 mg via ORAL
  Filled 2017-06-28 (×2): qty 1

## 2017-06-28 MED ORDER — ACETAMINOPHEN 10 MG/ML IV SOLN
INTRAVENOUS | Status: AC
  Filled 2017-06-28: qty 100

## 2017-06-28 MED ORDER — MIDAZOLAM HCL 2 MG/2ML IJ SOLN: 2.0000 mg | Freq: Once | INTRAMUSCULAR | Status: DC

## 2017-06-28 MED ORDER — SODIUM CHLORIDE 0.9 % IJ SOLN
INTRAMUSCULAR | Status: DC | PRN
  Administered 2017-06-28 (×2): 30 mL

## 2017-06-28 MED ORDER — PHENYLEPHRINE 40 MCG/ML (10ML) SYRINGE FOR IV PUSH (FOR BLOOD PRESSURE SUPPORT)
PREFILLED_SYRINGE | INTRAVENOUS | Status: AC
  Filled 2017-06-28: qty 10

## 2017-06-28 MED ORDER — MENTHOL 3 MG MT LOZG: 1.0000 | LOZENGE | OROMUCOSAL | Status: DC | PRN

## 2017-06-28 MED ORDER — ACETAMINOPHEN 325 MG PO TABS: 650.0000 mg | ORAL_TABLET | Freq: Four times a day (QID) | ORAL | Status: DC | PRN

## 2017-06-28 MED ORDER — FLEET ENEMA 7-19 GM/118ML RE ENEM: 1.0000 | ENEMA | Freq: Once | RECTAL | Status: DC | PRN

## 2017-06-28 MED ORDER — MIDAZOLAM HCL 2 MG/2ML IJ SOLN
INTRAMUSCULAR | Status: AC
  Filled 2017-06-28: qty 2

## 2017-06-28 MED ORDER — LACTATED RINGERS IV SOLN
INTRAVENOUS | Status: DC
Start: 2017-06-28 — End: 2017-06-28
  Administered 2017-06-28 (×3): via INTRAVENOUS

## 2017-06-28 MED ORDER — BUPIVACAINE LIPOSOME 1.3 % IJ SUSP
20.0000 mL | Freq: Once | INTRAMUSCULAR | Status: DC
  Filled 2017-06-28: qty 20

## 2017-06-28 MED ORDER — METHOCARBAMOL 1000 MG/10ML IJ SOLN
500.0000 mg | Freq: Four times a day (QID) | INTRAVENOUS | Status: DC | PRN
  Administered 2017-06-28: 500 mg via INTRAVENOUS
  Filled 2017-06-28: qty 550

## 2017-06-28 MED ORDER — PROPOFOL 500 MG/50ML IV EMUL
INTRAVENOUS | Status: DC | PRN
  Administered 2017-06-28: 75 ug/kg/min via INTRAVENOUS

## 2017-06-28 MED ORDER — PHENYLEPHRINE HCL 10 MG/ML IJ SOLN
INTRAMUSCULAR | Status: DC | PRN
  Administered 2017-06-28 (×2): 80 ug via INTRAVENOUS
  Administered 2017-06-28: 40 ug via INTRAVENOUS
  Administered 2017-06-28: 80 ug via INTRAVENOUS
  Administered 2017-06-28: 40 ug via INTRAVENOUS
  Administered 2017-06-28 (×2): 80 ug via INTRAVENOUS
  Administered 2017-06-28: 40 ug via INTRAVENOUS

## 2017-06-28 MED ORDER — ONDANSETRON HCL 4 MG PO TABS: 4.0000 mg | ORAL_TABLET | Freq: Four times a day (QID) | ORAL | Status: DC | PRN

## 2017-06-28 MED ORDER — HYDROMORPHONE HCL 2 MG PO TABS
2.0000 mg | ORAL_TABLET | ORAL | Status: DC | PRN
  Administered 2017-06-28 – 2017-06-30 (×9): 2 mg via ORAL
  Filled 2017-06-28 (×9): qty 1

## 2017-06-28 MED ORDER — PROPOFOL 10 MG/ML IV BOLUS
INTRAVENOUS | Status: DC | PRN
  Administered 2017-06-28: 20 ug via INTRAVENOUS
  Administered 2017-06-28: 30 ug via INTRAVENOUS

## 2017-06-28 MED ORDER — ACETAMINOPHEN 500 MG PO TABS
1000.0000 mg | ORAL_TABLET | Freq: Four times a day (QID) | ORAL | Status: AC
  Administered 2017-06-28 – 2017-06-29 (×3): 1000 mg via ORAL
  Filled 2017-06-28 (×4): qty 2

## 2017-06-28 MED ORDER — 0.9 % SODIUM CHLORIDE (POUR BTL) OPTIME
TOPICAL | Status: DC | PRN
  Administered 2017-06-28: 1000 mL

## 2017-06-28 MED ORDER — PROPOFOL 10 MG/ML IV BOLUS
INTRAVENOUS | Status: AC
  Filled 2017-06-28: qty 20

## 2017-06-28 MED ORDER — BUPIVACAINE LIPOSOME 1.3 % IJ SUSP
INTRAMUSCULAR | Status: DC | PRN
  Administered 2017-06-28 (×2): 10 mL

## 2017-06-28 MED ORDER — DIPHENHYDRAMINE HCL 12.5 MG/5ML PO ELIX: 12.5000 mg | ORAL_SOLUTION | ORAL | Status: DC | PRN

## 2017-06-28 MED ORDER — ROPIVACAINE HCL 7.5 MG/ML IJ SOLN
INTRAMUSCULAR | Status: DC | PRN
  Administered 2017-06-28: 20 mL via PERINEURAL

## 2017-06-28 MED ORDER — TRANEXAMIC ACID 1000 MG/10ML IV SOLN
2000.0000 mg | Freq: Once | INTRAVENOUS | Status: AC
  Administered 2017-06-28: 2000 mg via TOPICAL
  Filled 2017-06-28: qty 20

## 2017-06-28 MED ORDER — SODIUM CHLORIDE 0.9 % IV SOLN: INTRAVENOUS | Status: DC

## 2017-06-28 MED ORDER — LEVOMEFOLIC ACID 1 MG PO CAPS: 1.0000 | ORAL_CAPSULE | Freq: Every day | ORAL | Status: DC

## 2017-06-28 MED ORDER — FENTANYL CITRATE (PF) 100 MCG/2ML IJ SOLN
INTRAMUSCULAR | Status: AC
  Filled 2017-06-28: qty 2

## 2017-06-28 MED ORDER — DEXAMETHASONE SODIUM PHOSPHATE 10 MG/ML IJ SOLN
INTRAMUSCULAR | Status: AC
  Filled 2017-06-28: qty 1

## 2017-06-28 MED ORDER — ACETAMINOPHEN 10 MG/ML IV SOLN
1000.0000 mg | Freq: Once | INTRAVENOUS | Status: AC
  Administered 2017-06-28: 1000 mg via INTRAVENOUS

## 2017-06-28 MED ORDER — DOCUSATE SODIUM 100 MG PO CAPS
100.0000 mg | ORAL_CAPSULE | Freq: Two times a day (BID) | ORAL | Status: DC
  Administered 2017-06-28 – 2017-06-30 (×5): 100 mg via ORAL
  Filled 2017-06-28 (×5): qty 1

## 2017-06-28 MED ORDER — STERILE WATER FOR IRRIGATION IR SOLN
Status: DC | PRN
  Administered 2017-06-28: 2000 mL

## 2017-06-28 MED ORDER — CEFAZOLIN SODIUM-DEXTROSE 2-4 GM/100ML-% IV SOLN
INTRAVENOUS | Status: AC
  Administered 2017-06-28: 2 g via INTRAVENOUS
  Filled 2017-06-28: qty 100

## 2017-06-28 MED ORDER — PHENOL 1.4 % MT LIQD: 1.0000 | OROMUCOSAL | Status: DC | PRN

## 2017-06-28 MED ORDER — CHLORHEXIDINE GLUCONATE 4 % EX LIQD: 60.0000 mL | Freq: Once | CUTANEOUS | Status: DC

## 2017-06-28 MED ORDER — SODIUM CHLORIDE 0.9 % IR SOLN
Status: DC | PRN
  Administered 2017-06-28: 1000 mL

## 2017-06-28 MED ORDER — FOLIC ACID 1 MG PO TABS
1.0000 mg | ORAL_TABLET | Freq: Every day | ORAL | Status: DC
  Administered 2017-06-28 – 2017-06-30 (×3): 1 mg via ORAL
  Filled 2017-06-28 (×3): qty 1

## 2017-06-28 MED ORDER — METOCLOPRAMIDE HCL 5 MG PO TABS: 5.0000 mg | ORAL_TABLET | Freq: Three times a day (TID) | ORAL | Status: DC | PRN

## 2017-06-28 MED ORDER — DULOXETINE HCL 30 MG PO CPEP
30.0000 mg | ORAL_CAPSULE | Freq: Every day | ORAL | Status: DC
  Administered 2017-06-29 – 2017-06-30 (×2): 30 mg via ORAL
  Filled 2017-06-28 (×2): qty 1

## 2017-06-28 MED ORDER — FENTANYL CITRATE (PF) 100 MCG/2ML IJ SOLN
100.0000 ug | Freq: Once | INTRAMUSCULAR | Status: AC
  Administered 2017-06-28: 100 ug via INTRAVENOUS

## 2017-06-28 MED ORDER — TRAMADOL HCL 50 MG PO TABS: 50.0000 mg | ORAL_TABLET | Freq: Four times a day (QID) | ORAL | Status: DC | PRN

## 2017-06-28 MED ORDER — DEXAMETHASONE SODIUM PHOSPHATE 10 MG/ML IJ SOLN
10.0000 mg | Freq: Once | INTRAMUSCULAR | Status: AC
  Administered 2017-06-29: 10 mg via INTRAVENOUS
  Filled 2017-06-28: qty 1

## 2017-06-28 MED ORDER — SODIUM CHLORIDE 0.9 % IJ SOLN
INTRAMUSCULAR | Status: AC
  Filled 2017-06-28: qty 10

## 2017-06-28 MED ORDER — PROPOFOL 10 MG/ML IV BOLUS
INTRAVENOUS | Status: AC
  Filled 2017-06-28: qty 40

## 2017-06-28 MED ORDER — METHOCARBAMOL 500 MG PO TABS
500.0000 mg | ORAL_TABLET | Freq: Four times a day (QID) | ORAL | Status: DC | PRN
  Administered 2017-06-28 – 2017-06-29 (×2): 500 mg via ORAL
  Filled 2017-06-28 (×2): qty 1

## 2017-06-28 MED ORDER — DEXAMETHASONE SODIUM PHOSPHATE 10 MG/ML IJ SOLN
10.0000 mg | Freq: Once | INTRAMUSCULAR | Status: AC
  Administered 2017-06-28: 10 mg via INTRAVENOUS

## 2017-06-28 MED ORDER — PROMETHAZINE HCL 25 MG/ML IJ SOLN: 6.2500 mg | INTRAMUSCULAR | Status: DC | PRN

## 2017-06-28 MED ORDER — CEFAZOLIN SODIUM-DEXTROSE 2-4 GM/100ML-% IV SOLN
2.0000 g | Freq: Four times a day (QID) | INTRAVENOUS | Status: AC
  Administered 2017-06-28 (×2): 2 g via INTRAVENOUS
  Filled 2017-06-28 (×2): qty 100

## 2017-06-28 MED ORDER — ACETAMINOPHEN 650 MG RE SUPP: 650.0000 mg | Freq: Four times a day (QID) | RECTAL | Status: DC | PRN

## 2017-06-28 MED ORDER — SODIUM CHLORIDE 0.9 % IJ SOLN
INTRAMUSCULAR | Status: AC
  Filled 2017-06-28: qty 50

## 2017-06-28 MED ORDER — HYDROMORPHONE HCL-NACL 0.5-0.9 MG/ML-% IV SOSY: 0.2500 mg | PREFILLED_SYRINGE | INTRAVENOUS | Status: DC | PRN

## 2017-06-28 SURGICAL SUPPLY — 50 items
BAG DECANTER FOR FLEXI CONT (MISCELLANEOUS) ×2 IMPLANT
BAG SPEC THK2 15X12 ZIP CLS (MISCELLANEOUS) ×1
BAG ZIPLOCK 12X15 (MISCELLANEOUS) ×2 IMPLANT
BANDAGE ACE 6X5 VEL STRL LF (GAUZE/BANDAGES/DRESSINGS) ×2 IMPLANT
BLADE SAG 18X100X1.27 (BLADE) ×2 IMPLANT
BLADE SAW SGTL 11.0X1.19X90.0M (BLADE) ×2 IMPLANT
BOWL SMART MIX CTS (DISPOSABLE) ×2 IMPLANT
CAP KNEE TOTAL 3 SIGMA ×1 IMPLANT
CEMENT HV SMART SET (Cement) ×4 IMPLANT
COVER SURGICAL LIGHT HANDLE (MISCELLANEOUS) ×2 IMPLANT
CUFF TOURN SGL QUICK 34 (TOURNIQUET CUFF) ×2
CUFF TRNQT CYL 34X4X40X1 (TOURNIQUET CUFF) ×1 IMPLANT
DECANTER SPIKE VIAL GLASS SM (MISCELLANEOUS) ×2 IMPLANT
DRAPE U-SHAPE 47X51 STRL (DRAPES) ×2 IMPLANT
DRSG ADAPTIC 3X8 NADH LF (GAUZE/BANDAGES/DRESSINGS) ×2 IMPLANT
DURAPREP 26ML APPLICATOR (WOUND CARE) ×2 IMPLANT
ELECT REM PT RETURN 15FT ADLT (MISCELLANEOUS) ×2 IMPLANT
EVACUATOR 1/8 PVC DRAIN (DRAIN) ×2 IMPLANT
GAUZE SPONGE 4X4 12PLY STRL (GAUZE/BANDAGES/DRESSINGS) ×2 IMPLANT
GLOVE BIO SURGEON STRL SZ8 (GLOVE) ×4 IMPLANT
GLOVE BIOGEL PI IND STRL 6.5 (GLOVE) IMPLANT
GLOVE BIOGEL PI IND STRL 7.0 (GLOVE) IMPLANT
GLOVE BIOGEL PI IND STRL 7.5 (GLOVE) IMPLANT
GLOVE BIOGEL PI IND STRL 8 (GLOVE) ×1 IMPLANT
GLOVE BIOGEL PI INDICATOR 6.5 (GLOVE) ×1
GLOVE BIOGEL PI INDICATOR 7.0 (GLOVE) ×2
GLOVE BIOGEL PI INDICATOR 7.5 (GLOVE) ×2
GLOVE BIOGEL PI INDICATOR 8 (GLOVE) ×2
GLOVE SURG SS PI 6.5 STRL IVOR (GLOVE) ×1 IMPLANT
GOWN STRL REUS W/TWL LRG LVL3 (GOWN DISPOSABLE) ×3 IMPLANT
GOWN STRL REUS W/TWL XL LVL3 (GOWN DISPOSABLE) ×2 IMPLANT
HANDPIECE INTERPULSE COAX TIP (DISPOSABLE) ×2
IMMOBILIZER KNEE 20 (SOFTGOODS) ×2
IMMOBILIZER KNEE 20 THIGH 36 (SOFTGOODS) ×1 IMPLANT
MANIFOLD NEPTUNE II (INSTRUMENTS) ×2 IMPLANT
PACK TOTAL KNEE CUSTOM (KITS) ×2 IMPLANT
PAD ABD 8X10 STRL (GAUZE/BANDAGES/DRESSINGS) ×1 IMPLANT
PADDING CAST COTTON 6X4 STRL (CAST SUPPLIES) ×6 IMPLANT
POSITIONER SURGICAL ARM (MISCELLANEOUS) ×2 IMPLANT
SET HNDPC FAN SPRY TIP SCT (DISPOSABLE) ×1 IMPLANT
STRIP CLOSURE SKIN 1/2X4 (GAUZE/BANDAGES/DRESSINGS) ×4 IMPLANT
SUT MNCRL AB 4-0 PS2 18 (SUTURE) ×2 IMPLANT
SUT STRATAFIX 0 PDS 27 VIOLET (SUTURE) ×2
SUT VIC AB 2-0 CT1 27 (SUTURE) ×6
SUT VIC AB 2-0 CT1 TAPERPNT 27 (SUTURE) ×3 IMPLANT
SUTURE STRATFX 0 PDS 27 VIOLET (SUTURE) ×1 IMPLANT
SYR 30ML LL (SYRINGE) ×4 IMPLANT
TRAY FOLEY W/METER SILVER 16FR (SET/KITS/TRAYS/PACK) ×2 IMPLANT
WRAP KNEE MAXI GEL POST OP (GAUZE/BANDAGES/DRESSINGS) ×2 IMPLANT
YANKAUER SUCT BULB TIP 10FT TU (MISCELLANEOUS) ×2 IMPLANT

## 2017-06-28 NOTE — Op Note (Signed)
OPERATIVE REPORT-TOTAL KNEE ARTHROPLASTY   Pre-operative diagnosis- Osteoarthritis  Left knee(s)  Post-operative diagnosis- Osteoarthritis Left knee(s)  Procedure-  Left  Total Knee Arthroplasty  Surgeon- Dione Plover. Cyla Haluska, MD  Assistant- Ardeen Jourdain, PA-C   Anesthesia-  Adductor canal block and spinal  EBL-* No blood loss amount entered *   Drains Hemovac  Tourniquet time-  Total Tourniquet Time Documented: Thigh (Left) - 30 minutes Total: Thigh (Left) - 30 minutes     Complications- None  Condition-PACU - hemodynamically stable.   Brief Clinical Note  Maria [name] is a 81 y.o. year old female with end stage OA of her left knee with progressively worsening pain and dysfunction. She has constant pain, with activity and at rest and significant functional deficits with difficulties even with ADLs. She has had extensive non-op management including analgesics, injections of cortisone and viscosupplements, and home exercise program, but remains in significant pain with significant dysfunction. Radiographs show bone on bone arthritis lateral and patellofemoral. She presents now for left Total Knee Arthroplasty.    Procedure in detail---   The patient is brought into the operating room and positioned supine on the operating table. After successful administration of  Adductor canal block and spinal,   a tourniquet is placed high on the  Left thigh(s) and the lower extremity is prepped and draped in the usual sterile fashion. Time out is performed by the operating team and then the  Left lower extremity is wrapped in Esmarch, knee flexed and the tourniquet inflated to 300 mmHg.       A midline incision is made with a ten blade through the subcutaneous tissue to the level of the extensor mechanism. A fresh blade is used to make a medial parapatellar arthrotomy. Soft tissue over the proximal medial tibia is subperiosteally elevated to the joint line with a knife and into the  semimembranosus bursa with a Cobb elevator. Soft tissue over the proximal lateral tibia is elevated with attention being paid to avoiding the patellar tendon on the tibial tubercle. The patella is everted, knee flexed 90 degrees and the ACL and PCL are removed. Findings are bone on bone lateral and patellofemoral with large global osteophytes.        The drill is used to create a starting hole in the distal femur and the canal is thoroughly irrigated with sterile saline to remove the fatty contents. The 5 degree Left  valgus alignment guide is placed into the femoral canal and the distal femoral cutting block is pinned to remove 10 mm off the distal femur. Resection is made with an oscillating saw.      The tibia is subluxed forward and the menisci are removed. The extramedullary alignment guide is placed referencing proximally at the medial aspect of the tibial tubercle and distally along the second metatarsal axis and tibial crest. The block is pinned to remove 14mm off the more deficient lateral  side. Resection is made with an oscillating saw. Size 2.5is the most appropriate size for the tibia and the proximal tibia is prepared with the modular drill and keel punch for that size.      The femoral sizing guide is placed and size 3 is most appropriate. Rotation is marked off the epicondylar axis and confirmed by creating a rectangular flexion gap at 90 degrees. The size 3 cutting block is pinned in this rotation and the anterior, posterior and chamfer cuts are made with the oscillating saw. The intercondylar block is then placed and  that cut is made.      Trial size 2.5 tibial component, trial size 3 posterior stabilized femur and a 10  mm posterior stabilized rotating platform insert trial is placed. Full extension is achieved with excellent varus/valgus and anterior/posterior balance throughout full range of motion. The patella is everted and thickness measured to be 22  mm. Free hand resection is taken to 12  mm, a 35 template is placed, lug holes are drilled, trial patella is placed, and it tracks normally. Osteophytes are removed off the posterior femur with the trial in place. All trials are removed and the cut bone surfaces prepared with pulsatile lavage. Cement is mixed and once ready for implantation, the size 2.5 tibial implant, size  3 posterior stabilized femoral component, and the size 35 patella are cemented in place and the patella is held with the clamp. The trial insert is placed and the knee held in full extension. The Exparel (20 ml mixed with 60 ml saline) is injected into the extensor mechanism, posterior capsule, medial and lateral gutters and subcutaneous tissues.  All extruded cement is removed and once the cement is hard the permanent 10 mm posterior stabilized rotating platform insert is placed into the tibial tray.      The wound is copiously irrigated with saline solution and the extensor mechanism closed over a hemovac drain with #1 V-loc suture. The tourniquet is released for a total tourniquet time of 31  minutes. Flexion against gravity is 140 degrees and the patella tracks normally. Subcutaneous tissue is closed with 2.0 vicryl and subcuticular with running 4.0 Monocryl. The incision is cleaned and dried and steri-strips and a bulky sterile dressing are applied. The limb is placed into a knee immobilizer and the patient is awakened and transported to recovery in stable condition.      Please note that a surgical assistant was a medical necessity for this procedure in order to perform it in a safe and expeditious manner. Surgical assistant was necessary to retract the ligaments and vital neurovascular structures to prevent injury to them and also necessary for proper positioning of the limb to allow for anatomic placement of the prosthesis.   Dione Plover Amelda Hapke, MD    06/28/2017, 10:11 AM

## 2017-06-28 NOTE — Anesthesia Preprocedure Evaluation (Signed)
Anesthesia Evaluation  Patient identified by MRN, date of birth, ID band Patient awake    Reviewed: Allergy & Precautions, NPO status , Patient's Chart, lab work & pertinent test results  Airway Mallampati: II  TM Distance: >3 FB Neck ROM: Full    Dental no notable dental hx.    Pulmonary sleep apnea , former smoker,    Pulmonary exam normal breath sounds clear to auscultation       Cardiovascular negative cardio ROS   Rhythm:Regular Rate:Normal + Systolic murmurs    Neuro/Psych negative neurological ROS  negative psych ROS   GI/Hepatic negative GI ROS, Neg liver ROS,   Endo/Other  negative endocrine ROS  Renal/GU negative Renal ROS  negative genitourinary   Musculoskeletal negative musculoskeletal ROS (+)   Abdominal   Peds negative pediatric ROS (+)  Hematology negative hematology ROS (+)   Anesthesia Other Findings   Reproductive/Obstetrics negative OB ROS                             Anesthesia Physical Anesthesia Plan  ASA: III  Anesthesia Plan: Spinal   Post-op Pain Management:    Induction: Intravenous  PONV Risk Score and Plan: 2 and Ondansetron and Dexamethasone  Airway Management Planned: Simple Face Mask  Additional Equipment:   Intra-op Plan:   Post-operative Plan:   Informed Consent: I have reviewed the patients History and Physical, chart, labs and discussed the procedure including the risks, benefits and alternatives for the proposed anesthesia with the patient or authorized representative who has indicated his/her understanding and acceptance.   Dental advisory given  Plan Discussed with: CRNA and Surgeon  Anesthesia Plan Comments:         Anesthesia Quick Evaluation

## 2017-06-28 NOTE — Addendum Note (Signed)
Addendum  created 06/28/17 1227 by Myrtie Soman, MD   Order list changed, Order sets accessed

## 2017-06-28 NOTE — Progress Notes (Signed)
Moved Data to correct time 1339

## 2017-06-28 NOTE — Progress Notes (Signed)
Pt states that she will self administer CPAP when ready for bed.  RT to monitor and assess as needed.  

## 2017-06-28 NOTE — Discharge Instructions (Addendum)
° °Dr. Frank Aluisio °Total Joint Specialist °[facility] °[address] °[place], [place] [address] °[phone] ° °TOTAL KNEE REPLACEMENT POSTOPERATIVE DIRECTIONS ° °Knee Rehabilitation, Guidelines Following Surgery  °Results after knee surgery are often greatly improved when you follow the exercise, range of motion and muscle strengthening exercises prescribed by your doctor. Safety measures are also important to protect the knee from further injury. Any time any of these exercises cause you to have increased pain or swelling in your knee joint, decrease the amount until you are comfortable again and slowly increase them. If you have problems or questions, call your caregiver or physical therapist for advice.  ° °HOME CARE INSTRUCTIONS  °Remove items at home which could result in a fall. This includes throw rugs or furniture in walking pathways.  °· ICE to the affected knee every three hours for 30 minutes at a time and then as needed for pain and swelling.  Continue to use ice on the knee for pain and swelling from surgery. You may notice swelling that will progress down to the foot and ankle.  This is normal after surgery.  Elevate the leg when you are not up walking on it.   °· Continue to use the breathing machine which will help keep your temperature down.  It is common for your temperature to cycle up and down following surgery, especially at night when you are not up moving around and exerting yourself.  The breathing machine keeps your lungs expanded and your temperature down. °· Do not place pillow under knee, focus on keeping the knee straight while resting ° °DIET °You may resume your previous home diet once your are discharged from the hospital. ° °DRESSING / WOUND CARE / SHOWERING °You may shower 3 days after surgery, but keep the wounds dry during showering.  You may use an occlusive plastic wrap (Press'n Seal for example), NO SOAKING/SUBMERGING IN THE BATHTUB.  If the  bandage gets wet, change with a clean dry gauze.  If the incision gets wet, pat the wound dry with a clean towel. °You may start showering once you are discharged home but do not submerge the incision under water. Just pat the incision dry and apply a dry gauze dressing on daily. °Change the surgical dressing daily and reapply a dry dressing each time. ° °ACTIVITY °Walk with your walker as instructed. °Use walker as long as suggested by your caregivers. °Avoid periods of inactivity such as sitting longer than an hour when not asleep. This helps prevent blood clots.  °You may resume a sexual relationship in one month or when given the OK by your doctor.  °You may return to work once you are cleared by your doctor.  °Do not drive a car for 6 weeks or until released by you surgeon.  °Do not drive while taking narcotics. ° °WEIGHT BEARING °Weight bearing as tolerated with assist device (walker, cane, etc) as directed, use it as long as suggested by your surgeon or therapist, typically at least 4-6 weeks. ° °POSTOPERATIVE CONSTIPATION PROTOCOL °Constipation - defined medically as fewer than three stools per week and severe constipation as less than one stool per week. ° °One of the most common issues patients have following surgery is constipation.  Even if you have a regular bowel pattern at home, your normal regimen is likely to be disrupted due to multiple reasons following surgery.  Combination of anesthesia, postoperative narcotics, change in appetite and fluid intake all can affect your bowels.    In order to avoid complications following surgery, here are some recommendations in order to help you during your recovery period. ° °Colace (docusate) - Pick up an over-the-counter form of Colace or another stool softener and take twice a day as long as you are requiring postoperative pain medications.  Take with a full glass of water daily.  If you experience loose stools or diarrhea, hold the colace until you stool forms  back up.  If your symptoms do not get better within 1 week or if they get worse, check with your doctor. ° °Dulcolax (bisacodyl) - Pick up over-the-counter and take as directed by the product packaging as needed to assist with the movement of your bowels.  Take with a full glass of water.  Use this product as needed if not relieved by Colace only.  ° °MiraLax (polyethylene glycol) - Pick up over-the-counter to have on hand.  MiraLax is a solution that will increase the amount of water in your bowels to assist with bowel movements.  Take as directed and can mix with a glass of water, juice, soda, coffee, or tea.  Take if you go more than two days without a movement. °Do not use MiraLax more than once per day. Call your doctor if you are still constipated or irregular after using this medication for 7 days in a row. ° °If you continue to have problems with postoperative constipation, please contact the office for further assistance and recommendations.  If you experience "the worst abdominal pain ever" or develop nausea or vomiting, please contact the office immediatly for further recommendations for treatment. ° °ITCHING ° If you experience itching with your medications, try taking only a single pain pill, or even half a pain pill at a time.  You can also use Benadryl over the counter for itching or also to help with sleep.  ° °TED HOSE STOCKINGS °Wear the elastic stockings on both legs for three weeks following surgery during the day but you may remove then at night for sleeping. ° °MEDICATIONS °See your medication summary on the “After Visit Summary” that the nursing staff will review with you prior to discharge.  You may have some home medications which will be placed on hold until you complete the course of blood thinner medication.  It is important for you to complete the blood thinner medication as prescribed by your surgeon.  Continue your approved medications as instructed at time of  discharge. ° °PRECAUTIONS °If you experience chest pain or shortness of breath - call 911 immediately for transfer to the hospital emergency department.  °If you develop a fever greater that 101 F, purulent drainage from wound, increased redness or drainage from wound, foul odor from the wound/dressing, or calf pain - CONTACT YOUR SURGEON.   °                                                °FOLLOW-UP APPOINTMENTS °Make sure you keep all of your appointments after your operation with your surgeon and caregivers. You should call the office at the above phone number and make an appointment for approximately two weeks after the date of your surgery or on the date instructed by your surgeon outlined in the "After Visit Summary". ° ° °RANGE OF MOTION AND STRENGTHENING EXERCISES  °Rehabilitation of the knee is important following a knee injury or   an operation. After just a few days of immobilization, the muscles of the thigh which control the knee become weakened and shrink (atrophy). Knee exercises are designed to build up the tone and strength of the thigh muscles and to improve knee motion. Often times heat used for twenty to thirty minutes before working out will loosen up your tissues and help with improving the range of motion but do not use heat for the first two weeks following surgery. These exercises can be done on a training (exercise) mat, on the floor, on a table or on a bed. Use what ever works the best and is most comfortable for you Knee exercises include:  Leg Lifts - While your knee is still immobilized in a splint or cast, you can do straight leg raises. Lift the leg to 60 degrees, hold for 3 sec, and slowly lower the leg. Repeat 10-20 times 2-3 times daily. Perform this exercise against resistance later as your knee gets better.  Quad and Hamstring Sets - Tighten up the muscle on the front of the thigh (Quad) and hold for 5-10 sec. Repeat this 10-20 times hourly. Hamstring sets are done by pushing the  foot backward against an object and holding for 5-10 sec. Repeat as with quad sets.   Leg Slides: Lying on your back, slowly slide your foot toward your buttocks, bending your knee up off the floor (only go as far as is comfortable). Then slowly slide your foot back down until your leg is flat on the floor again.  Angel Wings: Lying on your back spread your legs to the side as far apart as you can without causing discomfort.  A rehabilitation program following serious knee injuries can speed recovery and prevent re-injury in the future due to weakened muscles. Contact your doctor or a physical therapist for more information on knee rehabilitation.   IF YOU ARE TRANSFERRED TO A SKILLED REHAB FACILITY If the patient is transferred to a skilled rehab facility following release from the hospital, a list of the current medications will be sent to the facility for the patient to continue.  When discharged from the skilled rehab facility, please have the facility set up the patient's [place] prior to being released. Also, the skilled facility will be responsible for providing the patient with their medications at time of release from the facility to include their pain medication, the muscle relaxants, and their blood thinner medication. If the patient is still at the rehab facility at time of the two week follow up appointment, the skilled rehab facility will also need to assist the patient in arranging follow up appointment in our office and any transportation needs.  MAKE SURE YOU:  Understand these instructions.  Get help right away if you are not doing well or get worse.    Pick up stool softner and laxative for home use following surgery while on pain medications. Do not submerge ncision under water. Please use good hand washing techniques while changing dressing each day. May shower starting three days after surgery. Please use a clean towel to pat the incision dry following  showers. Continue to use ice for pain and swelling after surgery. Do not use any lotions or creams on the incision until instructed by your surgeon.  Take Xarelto for two and a half more weeks following discharge from the hospital, then discontinue Xarelto. Once the patient has completed the blood thinner regimen, then take a Baby 81 mg Aspirin daily for three  more weeks.    Information on my medicine - XARELTO (Rivaroxaban)  This medication education was reviewed with me or my healthcare representative as part of my discharge preparation.   Why was Xarelto prescribed for you? Xarelto was prescribed for you to reduce the risk of blood clots forming after orthopedic surgery. The medical term for these abnormal blood clots is venous thromboembolism (VTE).  What do you need to know about xarelto ? Take your Xarelto ONCE DAILY at the same time every day. You may take it either with or without food.  If you have difficulty swallowing the tablet whole, you may crush it and mix in applesauce just prior to taking your dose.  Take Xarelto exactly as prescribed by your doctor and DO NOT stop taking Xarelto without talking to the doctor who prescribed the medication.  Stopping without other VTE prevention medication to take the place of Xarelto may increase your risk of developing a clot.  After discharge, you should have regular check-up appointments with your healthcare provider that is prescribing your Xarelto.    What do you do if you miss a dose? If you miss a dose, take it as soon as you remember on the same day then continue your regularly scheduled once daily regimen the next day. Do not take two doses of Xarelto on the same day.   Important Safety Information A possible side effect of Xarelto is bleeding. You should call your healthcare provider right away if you experience any of the following: ? Bleeding from an injury or your nose that does not stop. ? Unusual colored urine  (red or dark brown) or unusual colored stools (red or black). ? Unusual bruising for unknown reasons. ? A serious fall or if you hit your head (even if there is no bleeding).  Some medicines may interact with Xarelto and might increase your risk of bleeding while on Xarelto. To help avoid this, consult your healthcare provider or pharmacist prior to using any new prescription or non-prescription medications, including herbals, vitamins, non-steroidal anti-inflammatory drugs (NSAIDs) and supplements.  This website has more information on Xarelto: [email or web address]

## 2017-06-28 NOTE — Interval H&P Note (Signed)
History and Physical Interval Note:  06/28/2017 8:13 AM  [name]  has presented today for surgery, with the diagnosis of Osteoarthritis Left Knee  The various methods of treatment have been discussed with the patient and family. After consideration of risks, benefits and other options for treatment, the patient has consented to  Procedure(s): LEFT TOTAL KNEE ARTHROPLASTY (Left) as a surgical intervention .  The patient's history has been reviewed, patient examined, no change in status, stable for surgery.  I have reviewed the patient's chart and labs.  Questions were answered to the patient's satisfaction.     Gearlean Alf

## 2017-06-28 NOTE — Anesthesia Postprocedure Evaluation (Signed)
Anesthesia Post Note  Patient: Maria [name]  Procedure(s) Performed: Procedure(s) (LRB): LEFT TOTAL KNEE ARTHROPLASTY (Left)     Patient location during evaluation: PACU Anesthesia Type: Spinal Level of consciousness: oriented and awake and alert Pain management: pain level controlled Vital Signs Assessment: post-procedure vital signs reviewed and stable Respiratory status: spontaneous breathing, respiratory function stable and patient connected to nasal cannula oxygen Cardiovascular status: blood pressure returned to baseline and stable Postop Assessment: no headache and no backache Anesthetic complications: no    Last Vitals:  Vitals:   06/28/17 1100 06/28/17 1110  BP: (!) 119/59   Pulse: 81 81  Resp: 16 17  Temp:    SpO2: 100% 99%    Last Pain:  Vitals:   06/28/17 1110  TempSrc:   PainSc: 0-No pain                 Ura Yingling S

## 2017-06-28 NOTE — Evaluation (Signed)
Physical Therapy Evaluation Patient Details Name: Maria [name] MRN: [id number] DOB: 1936-04-29 Today's Date: 06/28/2017   History of Present Illness  81 y.o. female admitted on 06/28/17 for elective L TKA.  Pt with significant PMH of MVP, R TKA, and lobectomy.  Clinical Impression  Pt is POD #0 and limited a bit by nausea/lightheadedness, but was able to make it a short distance down the hallway before sitting and being wheeled back.  Min assist overall for mobility and pt has pre arranged for [facility] SNF at discharge.   PT to follow acutely for deficits listed below.       Follow Up Recommendations SNF;DC plan and follow up therapy as arranged by surgeon Senaida Lange per pt at discharge)    Equipment Recommendations  None recommended by PT    Recommendations for Other Services   NA    Precautions / Restrictions Precautions Precautions: Knee Precaution Booklet Issued: Yes (comment) Precaution Comments: knee exercise handout given, no pillow under operative knee reviewed Required Braces or Orthoses: Knee Immobilizer - Left Knee Immobilizer - Left: On when out of bed or walking Restrictions Weight Bearing Restrictions: Yes LLE Weight Bearing: Weight bearing as tolerated      Mobility  Bed Mobility Overal bed mobility: Needs Assistance Bed Mobility: Supine to Sit     Supine to sit: Min assist     General bed mobility comments: Min assist to help progress left leg to EOB and give pt a hand to pull trunk up to sitting EOB.   Transfers Overall transfer level: Needs assistance Equipment used: Rolling walker (2 wheeled) Transfers: Sit to/from Stand Sit to Stand: Min assist         General transfer comment: Min assist to stupport trunk during transitions and to stabilize RW despite Verbal cues to push up from sitting surface, pt used RW to pull to stand.   Ambulation/Gait Ambulation/Gait assistance: Min assist Ambulation Distance (Feet): 20 Feet Assistive device:  Rolling walker (2 wheeled) Gait Pattern/deviations: Step-to pattern;Antalgic;Trunk flexed Gait velocity: decreased   General Gait Details: Pt with moderately antalgic gait pattern, min assist at trunk for balance and support when WB on her left leg.  Flexed trunk posture with cues for upright posture during gait.  Verbal cues for correct LE sequencing and safe RW use as well. Pt became a little nauseated at the end of gait, so chair pulled up behind her to sit and rolled back to room.   Stairs            Wheelchair Mobility    Modified Rankin (Stroke Patients Only)       Balance Overall balance assessment: Needs assistance Sitting-balance support: Feet supported;No upper extremity supported Sitting balance-Leahy Scale: Good     Standing balance support: Bilateral upper extremity supported Standing balance-Leahy Scale: Poor Standing balance comment: min assist and reliance on RW                             Pertinent Vitals/Pain Pain Assessment: 0-10 Pain Score: 9  Pain Location: left knee Pain Descriptors / Indicators: Aching;Burning Pain Intervention(s): Limited activity within patient's tolerance;Monitored during session;Premedicated before session;Repositioned;Ice applied    Home Living Family/patient expects to be discharged to:: Private residence Living Arrangements: Alone Available Help at Discharge: Friend(s);Available PRN/intermittently Type of Home: House (condo) Home Access: Stairs to enter Entrance Stairs-Rails: Right Entrance Stairs-Number of Steps: 2 Home Layout: One level Home Equipment: Walker - 2 wheels;Cane -  single point;Bedside commode;Shower seat (plans to borrow all of the equip she doesn't have)      Prior Function Level of Independence: Independent with assistive device(s)         Comments: used REI walking stick PTA     Hand Dominance        Extremity/Trunk Assessment   Upper Extremity Assessment Upper Extremity  Assessment: Defer to OT evaluation    Lower Extremity Assessment Lower Extremity Assessment: LLE deficits/detail LLE Deficits / Details: left leg with normal post op pain and weakness, ankle at least 3/5, knee 2/5, hip flexion 2+/5    Cervical / Trunk Assessment Cervical / Trunk Assessment: Other exceptions Cervical / Trunk Exceptions: flexed trunk posture during gait, reports she had this PTA due to knee pain  Communication   Communication: No difficulties  Cognition Arousal/Alertness: Awake/alert Behavior During Therapy: WFL for tasks assessed/performed Overall Cognitive Status: Within Functional Limits for tasks assessed                                        General Comments      Exercises Total Joint Exercises Ankle Circles/Pumps: AROM;Both;20 reps   Assessment/Plan    PT Assessment Patient needs continued PT services  PT Problem List Decreased strength;Decreased activity tolerance;Decreased range of motion;Decreased balance;Decreased mobility;Decreased knowledge of use of DME;Decreased knowledge of precautions;Pain       PT Treatment Interventions DME instruction;Stair training;Gait training;Functional mobility training;Therapeutic activities;Therapeutic exercise;Balance training;Neuromuscular re-education;Patient/family education;Manual techniques;Modalities    PT Goals (Current goals can be found in the Care Plan section)  Acute Rehab PT Goals Patient Stated Goal: to go to SNF for rehab and then home PT Goal Formulation: With patient Time For Goal Achievement: 07/05/17 Potential to Achieve Goals: Good    Frequency 7X/week   Barriers to discharge        Co-evaluation               AM-PAC PT "6 Clicks" Daily Activity  Outcome Measure Difficulty turning over in bed (including adjusting bedclothes, sheets and blankets)?: Unable Difficulty moving from lying on back to sitting on the side of the bed? : Unable Difficulty sitting down on and  standing up from a chair with arms (e.g., wheelchair, bedside commode, etc,.)?: Unable Help needed moving to and from a bed to chair (including a wheelchair)?: A Little Help needed walking in hospital room?: A Little Help needed climbing 3-5 steps with a railing? : A Lot 6 Click Score: 11    End of Session Equipment Utilized During Treatment: Gait belt;Left knee immobilizer Activity Tolerance: Patient limited by fatigue;Patient limited by pain;Other (comment) (by lightheadedness. )     PT Visit Diagnosis: Muscle weakness (generalized) (M62.81);Difficulty in walking, not elsewhere classified (R26.2);Pain Pain - Right/Left: Left Pain - part of body: Knee    Time: 7353-2992 PT Time Calculation (min) (ACUTE ONLY): 29 min   Charges:   PT Evaluation $PT Eval Moderate Complexity: 1 Mod PT Treatments $Gait Training: 8-22 mins   PT G Codes:       Barbarann Ehlers Stephon Weathers 06/28/2017, 5:54 PM

## 2017-06-28 NOTE — Anesthesia Procedure Notes (Signed)
Anesthesia Procedure Image    

## 2017-06-28 NOTE — Anesthesia Procedure Notes (Signed)
Procedure Name: MAC Date/Time: 06/28/2017 9:10 AM Performed by: West Pugh Pre-anesthesia Checklist: Patient identified, Emergency Drugs available, Suction available, Patient being monitored and Timeout performed Patient Re-evaluated:Patient Re-evaluated prior to induction Oxygen Delivery Method: Simple face mask Placement Confirmation: CO2 detector,  breath sounds checked- equal and bilateral and positive ETCO2 Dental Injury: Teeth and Oropharynx as per pre-operative assessment

## 2017-06-28 NOTE — Anesthesia Procedure Notes (Signed)
Anesthesia Regional Block: Adductor canal block   Pre-Anesthetic Checklist: ,, timeout performed, Correct Patient, Correct Site, Correct Laterality, Correct Procedure, Correct Position, site marked, Risks and benefits discussed,  Surgical consent,  Pre-op evaluation,  At surgeon's request and post-op pain management  Laterality: Left  Prep: chloraprep       Needles:  Injection technique: Single-shot  Needle Type: Echogenic Needle     Needle Length: 9cm      Additional Needles:   Procedures: ultrasound guided,,,,,,,,  Narrative:  Start time: 06/28/2017 8:45 AM End time: 06/28/2017 8:54 AM Injection made incrementally with aspirations every 5 mL.  Performed by: Personally  Anesthesiologist: Rasheema Truluck  Additional Notes: Patient tolerated the procedure well without complications

## 2017-06-28 NOTE — Transfer of Care (Signed)
Immediate Anesthesia Transfer of Care Note  Patient: Maria [name]  Procedure(s) Performed: Procedure(s) with comments: LEFT TOTAL KNEE ARTHROPLASTY (Left) - Adductor Block  Patient Location: PACU  Anesthesia Type:Spinal MAC and regional for post op pain.  Level of Consciousness:  sedated, patient cooperative and responds to stimulation  Airway & Oxygen Therapy:Patient Spontanous Breathing and Patient connected to face mask oxgen  Post-op Assessment:  Report given to PACU RN and Post -op Vital signs reviewed and stable  Post vital signs:  Reviewed and stable  Last Vitals:  Vitals:   06/28/17 0856 06/28/17 0857  BP: (!) 180/104   Pulse: 80 81  Resp: (!) 9 20  Temp:    SpO2: 97% 35%    Complications: No apparent anesthesia complications

## 2017-06-29 LAB — BASIC METABOLIC PANEL
Anion gap: 10 (ref 5–15)
BUN: 12 mg/dL (ref 6–20)
CO2: 27 mmol/L (ref 22–32)
Calcium: 8.8 mg/dL — ABNORMAL LOW (ref 8.9–10.3)
Chloride: 102 mmol/L (ref 101–111)
Creatinine, Ser: 0.69 mg/dL (ref 0.44–1.00)
GFR calc Af Amer: >60 mL/min (ref >60)
GFR calc non Af Amer: >60 mL/min (ref >60)
Glucose, Bld: 132 mg/dL — ABNORMAL HIGH (ref 65–99)
Potassium: 3.4 mmol/L — ABNORMAL LOW (ref 3.5–5.1)
Sodium: 139 mmol/L (ref 135–145)

## 2017-06-29 LAB — CBC
HCT: 38.7 % (ref 36.0–46.0)
Hemoglobin: 13.3 g/dL (ref 12.0–15.0)
MCH: 30.6 pg (ref 26.0–34.0)
MCHC: 34.4 g/dL (ref 30.0–36.0)
MCV: 89.2 fL (ref 78.0–100.0)
Platelets: 240 10*3/uL (ref 150–400)
RBC: 4.34 MIL/uL (ref 3.87–5.11)
RDW: 12.6 % (ref 11.5–15.5)
WBC: 11.3 10*3/uL — ABNORMAL HIGH (ref 4.0–10.5)

## 2017-06-29 MED ORDER — METHOCARBAMOL 500 MG PO TABS: 500.0000 mg | ORAL_TABLET | Freq: Four times a day (QID) | ORAL | 0 refills | Status: DC | PRN

## 2017-06-29 MED ORDER — DOCUSATE SODIUM 100 MG PO CAPS: 100.0000 mg | ORAL_CAPSULE | Freq: Two times a day (BID) | ORAL | 0 refills | Status: DC

## 2017-06-29 MED ORDER — TRAMADOL HCL 50 MG PO TABS: 50.0000 mg | ORAL_TABLET | Freq: Four times a day (QID) | ORAL | 0 refills | Status: DC | PRN

## 2017-06-29 MED ORDER — POLYETHYLENE GLYCOL 3350 17 G PO PACK: 17.0000 g | PACK | Freq: Every day | ORAL | 0 refills | Status: DC | PRN

## 2017-06-29 MED ORDER — RIVAROXABAN 10 MG PO TABS: 10.0000 mg | ORAL_TABLET | Freq: Every day | ORAL | 0 refills | Status: DC

## 2017-06-29 MED ORDER — ACETAMINOPHEN 325 MG PO TABS: 650.0000 mg | ORAL_TABLET | Freq: Four times a day (QID) | ORAL | 0 refills | Status: DC | PRN

## 2017-06-29 MED ORDER — FLEET ENEMA 7-19 GM/118ML RE ENEM: 1.0000 | ENEMA | Freq: Once | RECTAL | 0 refills | Status: DC | PRN

## 2017-06-29 MED ORDER — BISACODYL 10 MG RE SUPP: 10.0000 mg | Freq: Every day | RECTAL | 0 refills | Status: DC | PRN

## 2017-06-29 MED ORDER — DIPHENHYDRAMINE HCL 12.5 MG/5ML PO ELIX: 12.5000 mg | ORAL_SOLUTION | ORAL | 0 refills | Status: DC | PRN

## 2017-06-29 MED ORDER — HYDROMORPHONE HCL 2 MG PO TABS: 2.0000 mg | ORAL_TABLET | ORAL | 0 refills | Status: DC | PRN

## 2017-06-29 MED ORDER — ONDANSETRON HCL 4 MG PO TABS: 4.0000 mg | ORAL_TABLET | Freq: Four times a day (QID) | ORAL | 0 refills | Status: DC | PRN

## 2017-06-29 NOTE — Clinical Social Work Note (Signed)
Clinical Social Work Assessment  Patient Details  Name: Maria [name] MRN: [id number] Date of Birth: 1936-01-22  Date of referral:  06/29/17               Reason for consult:  Facility Placement                Permission sought to share information with:  Family Supports Permission granted to share information::     Name::        Agency::     Relationship::  Son   Contact Information:     Housing/Transportation Living arrangements for the past 2 months:  Single Family Home Source of Information:  Patient Patient Interpreter Needed:  None Criminal Activity/Legal Involvement Pertinent to Current Situation/Hospitalization:  No - Comment as needed Significant Relationships:  Adult Children Lives with:  Self Do you feel safe going back to the place where you live?  Yes Need for family participation in patient care:  Yes (Comment)  Care giving concerns:  SNF placement   Social Worker assessment / plan:  CSW met with patient at bedside, explain role and reason for visit- to assist with discharge to [facility]. Patient reports is familiar with process after having similar procedure done before. Patient report this time things seem to be more smoothly as she reacting well to the medications. Patient reports her adult children are involved in her care. Patient prefers to transport by [facility] at discharge.   FL2 complete, PASRR received.   Plan: Assist with d/c to [facility] [place]   Employment status:  Retired Forensic scientist:  Programmer, applications PT Recommendations:  [place] / Referral to community resources:  [place]  Patient/Family's Response to care:  Agreeable and Responding well to care.   Patient/Family's Understanding of and Emotional Response to Diagnosis, Current Treatment, and Prognosis: " I want to this process to be as smooth as possible, it seem to be that way."  Emotional Assessment Appearance:  Appears stated  age Attitude/Demeanor/Rapport:    Affect (typically observed):  Pleasant, Calm Orientation:  Oriented to Self, Oriented to Place, Oriented to  Time, Oriented to Situation Alcohol / Substance use:  Not Applicable Psych involvement (Current and /or in the community):  No (Comment)  Discharge Needs  Concerns to be addressed:  Discharge Planning Concerns Readmission within the last 30 days:  No Current discharge risk:  None, Dependent with Mobility Barriers to Discharge:  Continued Medical Work up   [employer], LCSW 06/29/2017, 12:50 PM

## 2017-06-29 NOTE — Progress Notes (Signed)
Plan for d/c to SNF, discharge planning per CSW. [phone] 

## 2017-06-29 NOTE — Progress Notes (Signed)
   Subjective: 1 Day Post-Op Procedure(s) (LRB): LEFT TOTAL KNEE ARTHROPLASTY (Left) Patient reports pain as mild.   Patient seen in rounds for Dr. Wynelle Link. Patient is well, and has had no acute complaints or problems .  She is doing very well this morning. Will get social worker involved. We will start therapy today.  Plan is to go Skilled nursing facility after hospital stay.  Objective: Vital signs in last 24 hours: Temp:  [97.5 F (36.4 C)-98.3 F (36.8 C)] 98.2 F (36.8 C) (08/28 0623) Pulse Rate:  [78-100] 93 (08/28 0623) Resp:  [0-31] 17 (08/28 0623) BP: (118-180)/(59-104) 141/81 (08/28 0623) SpO2:  [91 %-100 %] 93 % (08/28 0623)  Intake/Output from previous day:  Intake/Output Summary (Last 24 hours) at 06/29/17 0841 Last data filed at 06/29/17 0998  Gross per 24 hour  Intake           4172.5 ml  Output             5760 ml  Net          -1587.5 ml    Intake/Output this shift: No intake/output data recorded.  Labs:  Recent Labs  06/29/17 0512  HGB 13.3    Recent Labs  06/29/17 0512  WBC 11.3*  RBC 4.34  HCT 38.7  PLT 240    Recent Labs  06/29/17 0512  NA 139  K 3.4*  CL 102  CO2 27  BUN 12  CREATININE 0.69  GLUCOSE 132*  CALCIUM 8.8*   No results for input(s): LABPT, INR in the last 72 hours.  EXAM General - Patient is Alert, Appropriate and Oriented Extremity - Neurovascular intact Sensation intact distally Intact pulses distally Dorsiflexion/Plantar flexion intact Dressing - dressing C/D/I Motor Function - intact, moving foot and toes well on exam.  Hemovac pulled without difficulty.  Past Medical History:  Diagnosis Date  . Complication of anesthesia    slow to awaken  . Depression   . Diverticulosis   . Goiter, nodular    [place] of thyroid stable 7/08  . Headache    hx of  . Heart murmur    due to rheumatic fever as child  . Mitral valve prolapse   . Multiple thyroid nodules 11/24/2015  . Neoplasm of lung, malignant ([place])  2002   Non-small cell surgical tx  . Osteoarthritis 11/14/2013   2010 intra-articular steroids X 3 S/P Physical Therapy in 12/14 TKR 12/11/13   . Osteopenia    Dr Pamala Hurry  . PONV (postoperative nausea and vomiting)    n/v  . Sleep apnea   . Vitamin D deficiency     Assessment/Plan: 1 Day Post-Op Procedure(s) (LRB): LEFT TOTAL KNEE ARTHROPLASTY (Left) Principal Problem:   OA (osteoarthritis) of knee  Estimated body mass index is 26.31 kg/m as calculated from the following:   Height as of this encounter: 5\' 6"  (1.676 m).   Weight as of this encounter: 73.9 kg (163 lb). Up with therapy Plan for discharge tomorrow Discharge to SNF - wants to look into [facility]  DVT Prophylaxis - Xarelto Weight-Bearing as tolerated to left leg D/C O2 and Pulse OX and try on Room Air  Arlee Muslim, PA-C Orthopaedic Surgery 06/29/2017, 8:41 AM

## 2017-06-29 NOTE — Evaluation (Signed)
Occupational Therapy Evaluation Patient Details Name: Maria [name] MRN: [id number] DOB: Jul 14, 1936 Today's Date: 06/29/2017    History of Present Illness 81 y.o. female admitted on 06/28/17 for elective L TKA.  Pt with significant PMH of MVP, R TKA, and lobectomy.   Clinical Impression   This 81 y/o F presents with the above. Pt lives alone, and at baseline reports she is independent with ADLs and functional mobility. Pt currently requires MinA for functional mobility at RW level, MinA for LB ADLs. Pt will benefit from additional acute and post acute OT services to maximize Pt's safety and independence with ADLs and functional mobility.     Follow Up Recommendations  DC plan and follow up therapy as arranged by surgeon;Other (comment);SNF (Pt reports plan is for SNF stay prior to d/c home )    Equipment Recommendations  Other (comment) (defer to next venue as Pt is going SNF )           Precautions / Restrictions Precautions Precautions: Knee Precaution Booklet Issued: Yes (comment) Precaution Comments: verbally reviewed knee precautions  Required Braces or Orthoses: Knee Immobilizer - Left Restrictions Weight Bearing Restrictions: No LLE Weight Bearing: Weight bearing as tolerated      Mobility Bed Mobility               General bed mobility comments: Pt sitting EOB with NT upon entering room   Transfers Overall transfer level: Needs assistance Equipment used: Rolling walker (2 wheeled) Transfers: Sit to/from Stand Sit to Stand: Min assist         General transfer comment: MinA to rise and steady; verbal cues for hand/LE placement     Balance Overall balance assessment: Needs assistance Sitting-balance support: Feet supported;No upper extremity supported Sitting balance-Leahy Scale: Good     Standing balance support: Bilateral upper extremity supported Standing balance-Leahy Scale: Poor Standing balance comment:  reliance on RW                            ADL either performed or assessed with clinical judgement   ADL Overall ADL's : Needs assistance/impaired Eating/Feeding: Set up;Sitting   Grooming: Set up;Sitting   Upper Body Bathing: Min guard;Sitting   Lower Body Bathing: Minimal assistance;Sit to/from stand   Upper Body Dressing : Minimal assistance;Sitting   Lower Body Dressing: Minimal assistance;Sit to/from stand Lower Body Dressing Details (indicate cue type and reason): Pt dons underwear with Min steady assist while standing to advance over hips  Toilet Transfer: Minimal assistance;Ambulation;BSC;RW Toilet Transfer Details (indicate cue type and reason): BSC over toilet  Toileting- Clothing Manipulation and Hygiene: Minimal assistance;Sit to/from stand       Functional mobility during ADLs: Minimal assistance;Rolling walker                           Pertinent Vitals/Pain Pain Assessment: Faces Faces Pain Scale: Hurts little more Pain Location: left knee Pain Descriptors / Indicators: Aching;Burning Pain Intervention(s): Limited activity within patient's tolerance;Monitored during session;Ice applied;Repositioned          Extremity/Trunk Assessment Upper Extremity Assessment Upper Extremity Assessment: Overall WFL for tasks assessed   Lower Extremity Assessment Lower Extremity Assessment: Defer to PT evaluation   Cervical / Trunk Assessment Cervical / Trunk Assessment: Other exceptions Cervical / Trunk Exceptions: flexed trunk posture during gait, reports she had this PTA due to knee pain   Communication Communication Communication: No difficulties  Cognition Arousal/Alertness: Awake/alert Behavior During Therapy: WFL for tasks assessed/performed Overall Cognitive Status: Within Functional Limits for tasks assessed                                                      Home Living Family/patient expects to be discharged to:: Private residence Living  Arrangements: Alone Available Help at Discharge: Friend(s);Available PRN/intermittently Type of Home: House (condo) Home Access: Stairs to enter [employer] of Steps: 2 Entrance Stairs-Rails: Right Home Layout: One level     Bathroom Shower/Tub: Occupational psychologist: Handicapped height     Home Equipment: Environmental consultant - 2 wheels;Cane - single point;Bedside commode;Shower seat   Additional Comments: plans to borrow the equipment she doesn't have       Prior Functioning/Environment Level of Independence: Independent with assistive device(s)        Comments: used REI walking stick PTA        OT Problem List: Decreased strength;Impaired balance (sitting and/or standing);Decreased knowledge of use of DME or AE;Decreased activity tolerance      OT Treatment/Interventions: Self-care/ADL training;DME and/or AE instruction;Therapeutic activities;Balance training;Therapeutic exercise;Patient/family education    OT Goals(Current goals can be found in the care plan section) Acute Rehab OT Goals Patient Stated Goal: to go to SNF for rehab and then home OT Goal Formulation: With patient Time For Goal Achievement: 07/13/17 Potential to Achieve Goals: Good  OT Frequency: Min 2X/week                             AM-PAC PT "6 Clicks" Daily Activity     Outcome Measure Help from another person eating meals?: None Help from another person taking care of personal grooming?: A Little Help from another person toileting, which includes using toliet, bedpan, or urinal?: A Little Help from another person bathing (including washing, rinsing, drying)?: A Lot Help from another person to put on and taking off regular upper body clothing?: A Little Help from another person to put on and taking off regular lower body clothing?: A Lot 6 Click Score: 17   End of Session Equipment Utilized During Treatment: Gait belt;Rolling walker CPM Left Knee CPM Left Knee: Off Nurse  Communication: Mobility status  Activity Tolerance: Patient tolerated treatment well Patient left: in chair;with call bell/phone within reach;with chair alarm set  OT Visit Diagnosis: Unsteadiness on feet (R26.81);Muscle weakness (generalized) (M62.81)                Time: 0045-9977 OT Time Calculation (min): 28 min Charges:  OT General Charges $OT Visit: 1 Visit OT Evaluation $OT Eval Low Complexity: 1 Low OT Treatments $Self Care/Home Management : 8-22 mins G-Codes:     Lou Cal, OT Pager [phone] 06/29/2017   Maria [name] 06/29/2017, 9:42 AM

## 2017-06-29 NOTE — Clinical Social Work Placement (Addendum)
   CLINICAL SOCIAL WORK PLACEMENT  NOTE  Date:  06/29/2017  Patient Details  Name: Maria [name] MRN: [id number] Date of Birth: 1936-04-12  Clinical Social Work is seeking post-discharge placement for this patient at the [place] level of care (*CSW will initial, date and re-position this form in  chart as items are completed):      Patient/family provided with [facility] Work Department's list of facilities offering this level of care within the geographic area requested by the patient (or if unable, by the patient's family).      Patient/family informed of their freedom to choose among providers that offer the needed level of care, that participate in Medicare, Medicaid or managed care program needed by the patient, have an available bed and are willing to accept the patient.      Patient/family informed of [facility]'s ownership interest in [facility] and [facility], as well as of the fact that they are under no obligation to receive care at these facilities.  PASRR submitted to EDS on       PASRR number received on       Existing PASRR number confirmed on 06/29/17     FL2 transmitted to all facilities in geographic area requested by pt/family on       FL2 transmitted to all facilities within larger geographic area on       Patient informed that his/her managed care company has contracts with or will negotiate with certain facilities, including the following:  [facility]     Yes   Patient/family informed of bed offers received.  Patient chooses bed at [facility] recommends and patient chooses bed at      Patient to be transferred to [facility] on  .06/30/2017  Patient to be transferred to facility by [place]     Patient family notified on   of transfer. Son notified.   Name of family member notified:        PHYSICIAN Please prepare priority discharge summary, including  medications, Please sign FL2     Additional Comment:    _______________________________________________ Lia Hopping, LCSW 06/29/2017, 1:14 PM

## 2017-06-29 NOTE — Progress Notes (Signed)
Data moved to 0810 Correct timing

## 2017-06-29 NOTE — Discharge Summary (Signed)
Physician Discharge Summary   Patient ID: Maria [name] MRN: [id number] DOB/AGE: [birth year] 81 y.o.  Admit date: 06/28/2017 Discharge date: 06/30/2017  Primary Diagnosis:  Osteoarthritis  Left knee(s) Admission Diagnoses:  Past Medical History:  Diagnosis Date  . Complication of anesthesia    slow to awaken  . Depression   . Diverticulosis   . Goiter, nodular    [place] of thyroid stable 7/08  . Headache    hx of  . Heart murmur    due to rheumatic fever as child  . Mitral valve prolapse   . Multiple thyroid nodules 11/24/2015  . Neoplasm of lung, malignant ([place]) 2002   Non-small cell surgical tx  . Osteoarthritis 11/14/2013   2010 intra-articular steroids X 3 S/P Physical Therapy in 12/14 TKR 12/11/13   . Osteopenia    Dr Pamala Hurry  . PONV (postoperative nausea and vomiting)    n/v  . Sleep apnea   . Vitamin D deficiency    Discharge Diagnoses:   Principal Problem:   OA (osteoarthritis) of knee  Estimated body mass index is 26.31 kg/m as calculated from the following:   Height as of this encounter: '5\' 6"'$  (1.676 m).   Weight as of this encounter: 73.9 kg (163 lb).  Procedure:  Procedure(s) (LRB): LEFT TOTAL KNEE ARTHROPLASTY (Left)   Consults: None  HPI: Maria [name] is a 81 y.o. year old female with end stage OA of her left knee with progressively worsening pain and dysfunction. She has constant pain, with activity and at rest and significant functional deficits with difficulties even with ADLs. She has had extensive non-op management including analgesics, injections of cortisone and viscosupplements, and home exercise program, but remains in significant pain with significant dysfunction. Radiographs show bone on bone arthritis lateral and patellofemoral. She presents now for left Total Knee Arthroplasty.   Laboratory Data: Admission on 06/28/2017  Component Date Value Ref Range Status  . ABO/RH(D) 06/28/2017 O POS   Final  . Antibody Screen 06/28/2017 NEG    Final  . Sample Expiration 06/28/2017 07/01/2017   Final  . WBC 06/29/2017 11.3* 4.0 - 10.5 K/uL Final  . RBC 06/29/2017 4.34  3.87 - 5.11 MIL/uL Final  . Hemoglobin 06/29/2017 13.3  12.0 - 15.0 g/dL Final  . HCT 06/29/2017 38.7  36.0 - 46.0 % Final  . MCV 06/29/2017 89.2  78.0 - 100.0 fL Final  . MCH 06/29/2017 30.6  26.0 - 34.0 pg Final  . MCHC 06/29/2017 34.4  30.0 - 36.0 g/dL Final  . RDW 06/29/2017 12.6  11.5 - 15.5 % Final  . Platelets 06/29/2017 240  150 - 400 K/uL Final  . Sodium 06/29/2017 139  135 - 145 mmol/L Final  . Potassium 06/29/2017 3.4* 3.5 - 5.1 mmol/L Final  . Chloride 06/29/2017 102  101 - 111 mmol/L Final  . CO2 06/29/2017 27  22 - 32 mmol/L Final  . Glucose, Bld 06/29/2017 132* 65 - 99 mg/dL Final  . BUN 06/29/2017 12  6 - 20 mg/dL Final  . Creatinine, Ser 06/29/2017 0.69  0.44 - 1.00 mg/dL Final  . Calcium 06/29/2017 8.8* 8.9 - 10.3 mg/dL Final  . GFR calc non Af Amer 06/29/2017 >60  >60 mL/min Final  . GFR calc Af Amer 06/29/2017 >60  >60 mL/min Final   Comment: (NOTE) The eGFR has been calculated using the CKD EPI equation. This calculation has not been validated in all clinical situations. eGFR's persistently <60 mL/min signify possible Chronic Kidney  Disease.   Georgiann Hahn gap 06/29/2017 10  5 - 15 Final  Hospital Outpatient Visit on 06/17/2017  Component Date Value Ref Range Status  . aPTT 06/17/2017 34  24 - 36 seconds Final  . WBC 06/17/2017 7.3  4.0 - 10.5 K/uL Final  . RBC 06/17/2017 4.82  3.87 - 5.11 MIL/uL Final  . Hemoglobin 06/17/2017 15.1* 12.0 - 15.0 g/dL Final  . HCT 06/17/2017 44.1  36.0 - 46.0 % Final  . MCV 06/17/2017 91.5  78.0 - 100.0 fL Final  . MCH 06/17/2017 31.3  26.0 - 34.0 pg Final  . MCHC 06/17/2017 34.2  30.0 - 36.0 g/dL Final  . RDW 06/17/2017 12.8  11.5 - 15.5 % Final  . Platelets 06/17/2017 249  150 - 400 K/uL Final  . Sodium 06/17/2017 140  135 - 145 mmol/L Final  . Potassium 06/17/2017 3.6  3.5 - 5.1 mmol/L Final  .  Chloride 06/17/2017 102  101 - 111 mmol/L Final  . CO2 06/17/2017 32  22 - 32 mmol/L Final  . Glucose, Bld 06/17/2017 114* 65 - 99 mg/dL Final  . BUN 06/17/2017 14  6 - 20 mg/dL Final  . Creatinine, Ser 06/17/2017 0.71  0.44 - 1.00 mg/dL Final  . Calcium 06/17/2017 9.2  8.9 - 10.3 mg/dL Final  . Total Protein 06/17/2017 6.8  6.5 - 8.1 g/dL Final  . Albumin 06/17/2017 4.3  3.5 - 5.0 g/dL Final  . AST 06/17/2017 29  15 - 41 U/L Final  . ALT 06/17/2017 20  14 - 54 U/L Final  . Alkaline Phosphatase 06/17/2017 70  38 - 126 U/L Final  . Total Bilirubin 06/17/2017 0.9  0.3 - 1.2 mg/dL Final  . GFR calc non Af Amer 06/17/2017 >60  >60 mL/min Final  . GFR calc Af Amer 06/17/2017 >60  >60 mL/min Final   Comment: (NOTE) The eGFR has been calculated using the CKD EPI equation. This calculation has not been validated in all clinical situations. eGFR's persistently <60 mL/min signify possible Chronic Kidney Disease.   . Anion gap 06/17/2017 6  5 - 15 Final  . Prothrombin Time 06/17/2017 12.6  11.4 - 15.2 seconds Final  . INR 06/17/2017 0.95   Final  . MRSA, PCR 06/17/2017 NEGATIVE  NEGATIVE Final  . Staphylococcus aureus 06/17/2017 NEGATIVE  NEGATIVE Final   Comment:        The Xpert SA Assay (FDA approved for NASAL specimens in patients over 61 years of age), is one component of a comprehensive surveillance program.  Test performance has been validated by [facility] for patients greater than or equal to 18 year old. It is not intended to diagnose infection nor to guide or monitor treatment.   . Color, Urine 06/17/2017 YELLOW  YELLOW Final  . APPearance 06/17/2017 CLEAR  CLEAR Final  . Specific Gravity, Urine 06/17/2017 1.010  1.005 - 1.030 Final  . pH 06/17/2017 7.0  5.0 - 8.0 Final  . Glucose, UA 06/17/2017 NEGATIVE  NEGATIVE mg/dL Final  . Hgb urine dipstick 06/17/2017 NEGATIVE  NEGATIVE Final  . Bilirubin Urine 06/17/2017 NEGATIVE  NEGATIVE Final  . Ketones, ur 06/17/2017 5*  NEGATIVE mg/dL Final  . Protein, ur 06/17/2017 NEGATIVE  NEGATIVE mg/dL Final  . Nitrite 06/17/2017 NEGATIVE  NEGATIVE Final  . Leukocytes, UA 06/17/2017 SMALL* NEGATIVE Final  . RBC / HPF 06/17/2017 NONE SEEN  0 - 5 RBC/hpf Final  . WBC, UA 06/17/2017 0-5  0 - 5 WBC/hpf Final  .  Bacteria, UA 06/17/2017 RARE* NONE SEEN Final  . Squamous Epithelial / LPF 06/17/2017 NONE SEEN  NONE SEEN Final  . Mucus 06/17/2017 PRESENT   Final     X-Rays:No results found.  EKG: Orders placed or performed during the hospital encounter of 06/17/17  . EKG 12 lead  . EKG 12 lead     Hospital Course: Maria [name] is a 81 y.o. who was admitted to [facility]. They were brought to the operating room on 06/28/2017 and underwent Procedure(s): LEFT TOTAL KNEE ARTHROPLASTY.  Patient tolerated the procedure well and was later transferred to the recovery room and then to the orthopaedic floor for postoperative care.  They were given PO and IV analgesics for pain control following their surgery.  They were given 24 hours of postoperative antibiotics of  Anti-infectives    Start     Dose/Rate Route Frequency Ordered Stop   06/28/17 1500  ceFAZolin (ANCEF) IVPB 2g/100 mL premix     2 g 200 mL/hr over 30 Minutes Intravenous Every 6 hours 06/28/17 1256 06/28/17 2210   06/28/17 0753  ceFAZolin (ANCEF) IVPB 2g/100 mL premix     2 g 200 mL/hr over 30 Minutes Intravenous On call to O.R. 06/28/17 6010 06/28/17 0950     and started on DVT prophylaxis in the form of Xarelto.   PT and OT were ordered for total joint protocol. Social Worker consulted to assist with placement of the patient. Discharge planning consulted to help with postop disposition and equipment needs.  Patient had a good night on the evening of surgery.  They started to get up OOB with therapy on day one. Hemovac drain was pulled without difficulty.  Continued to work with therapy into day two.  Dressing was changed on day two and the incision was  healing well. Patient was seen in rounds on day two and was ready to go to the SNF of choice - [facility] [place].  Diet: Regular diet Activity:WBAT Follow-up:in 2 weeks Disposition - Skilled nursing facility Discharged Condition: stable   Discharge Instructions    Call MD / Call 911    Complete by:  As directed    If you experience chest pain or shortness of breath, CALL 911 and be transported to the hospital emergency room.  If you develope a fever above 101 F, pus (white drainage) or increased drainage or redness at the wound, or calf pain, call your surgeon's office.   Change dressing    Complete by:  As directed    Change dressing daily with sterile 4 x 4 inch gauze dressing and apply TED hose. Do not submerge the incision under water.   Constipation Prevention    Complete by:  As directed    Drink plenty of fluids.  Prune juice may be helpful.  You may use a stool softener, such as Colace (over the counter) 100 mg twice a day.  Use MiraLax (over the counter) for constipation as needed.   Diet - low sodium heart healthy    Complete by:  As directed    Discharge instructions    Complete by:  As directed    Take Xarelto for two and a half more weeks, then discontinue Xarelto. Once the patient has completed the blood thinner regimen, then take a Baby 81 mg Aspirin daily for three more weeks.   Pick up stool softner and laxative for home use following surgery while on pain medications. Do not submerge incision under water. Please  use good hand washing techniques while changing dressing each day. May shower starting three days after surgery. Please use a clean towel to pat the incision dry following showers. Continue to use ice for pain and swelling after surgery. Do not use any lotions or creams on the incision until instructed by your surgeon.  Wear both TED hose on both legs during the day every day for three weeks, but may remove the TED hose at night at  home.  Postoperative Constipation Protocol  Constipation - defined medically as fewer than three stools per week and severe constipation as less than one stool per week.  One of the most common issues patients have following surgery is constipation.  Even if you have a regular bowel pattern at home, your normal regimen is likely to be disrupted due to multiple reasons following surgery.  Combination of anesthesia, postoperative narcotics, change in appetite and fluid intake all can affect your bowels.  In order to avoid complications following surgery, here are some recommendations in order to help you during your recovery period.  Colace (docusate) - Pick up an over-the-counter form of Colace or another stool softener and take twice a day as long as you are requiring postoperative pain medications.  Take with a full glass of water daily.  If you experience loose stools or diarrhea, hold the colace until you stool forms back up.  If your symptoms do not get better within 1 week or if they get worse, check with your doctor.  Dulcolax (bisacodyl) - Pick up over-the-counter and take as directed by the product packaging as needed to assist with the movement of your bowels.  Take with a full glass of water.  Use this product as needed if not relieved by Colace only.   MiraLax (polyethylene glycol) - Pick up over-the-counter to have on hand.  MiraLax is a solution that will increase the amount of water in your bowels to assist with bowel movements.  Take as directed and can mix with a glass of water, juice, soda, coffee, or tea.  Take if you go more than two days without a movement. Do not use MiraLax more than once per day. Call your doctor if you are still constipated or irregular after using this medication for 7 days in a row.  If you continue to have problems with postoperative constipation, please contact the office for further assistance and recommendations.  If you experience "the worst abdominal  pain ever" or develop nausea or vomiting, please contact the office immediatly for further recommendations for treatment.   Do not put a pillow under the knee. Place it under the heel.    Complete by:  As directed    Do not sit on low chairs, stoools or toilet seats, as it may be difficult to get up from low surfaces    Complete by:  As directed    Driving restrictions    Complete by:  As directed    No driving until released by the physician.   Increase activity slowly as tolerated    Complete by:  As directed    Lifting restrictions    Complete by:  As directed    No lifting until released by the physician.   Patient may shower    Complete by:  As directed    You may shower without a dressing once there is no drainage.  Do not wash over the wound.  If drainage remains, do not shower until drainage stops.  TED hose    Complete by:  As directed    Use stockings (TED hose) for 3 weeks on both leg(s).  You may remove them at night for sleeping.   Weight bearing as tolerated    Complete by:  As directed    Laterality:  left   Extremity:  Lower     Allergies as of 06/29/2017      Reactions   Morphine And Related    Mental status change   Oxycodone    Mental status changes post op 12/11/13   Sulfonamide Derivatives    REACTION: stomach pain      Medication List    STOP taking these medications   5-MTHF 1 MG Caps Generic drug:  Levomefolic Acid   ibuprofen 200 MG tablet Commonly known as:  ADVIL,MOTRIN   OVER THE COUNTER MEDICATION   OVER THE COUNTER MEDICATION   OVER THE COUNTER MEDICATION   Vitamin D3 50000 units Tabs     TAKE these medications   acetaminophen 325 MG tablet Commonly known as:  TYLENOL Take 2 tablets (650 mg total) by mouth every 6 (six) hours as needed for mild pain (or Fever >/= 101).   bisacodyl 10 MG suppository Commonly known as:  DULCOLAX Place 1 suppository (10 mg total) rectally daily as needed for moderate constipation.    diphenhydrAMINE 12.5 MG/5ML elixir Commonly known as:  BENADRYL Take 5-10 mLs (12.5-25 mg total) by mouth every 4 (four) hours as needed for itching.   docusate sodium 100 MG capsule Commonly known as:  COLACE Take 1 capsule (100 mg total) by mouth 2 (two) times daily.   DULoxetine 30 MG capsule Commonly known as:  CYMBALTA TAKE 1 CAPSULE (30 MG TOTAL) BY MOUTH DAILY. INCREASE TO 2 PILLS DAILY AFTER 2 WEEKS   HYDROmorphone 2 MG tablet Commonly known as:  DILAUDID Take 1 tablet (2 mg total) by mouth every 4 (four) hours as needed for severe pain.   methocarbamol 500 MG tablet Commonly known as:  ROBAXIN Take 1 tablet (500 mg total) by mouth every 6 (six) hours as needed for muscle spasms.   ondansetron 4 MG tablet Commonly known as:  ZOFRAN Take 1 tablet (4 mg total) by mouth every 6 (six) hours as needed for nausea.   polyethylene glycol packet Commonly known as:  MIRALAX / GLYCOLAX Take 17 g by mouth daily as needed for mild constipation.   psyllium 58.6 % powder Commonly known as:  METAMUCIL Take 1 packet by mouth daily. 1 teaspoon   rivaroxaban 10 MG Tabs tablet Commonly known as:  XARELTO Take 1 tablet (10 mg total) by mouth daily with breakfast. Take Xarelto for two and a half more weeks following discharge from the hospital, then discontinue Xarelto. Once the patient has completed the blood thinner regimen, then take a Baby 81 mg Aspirin daily for three more weeks.   sodium phosphate 7-19 GM/118ML Enem Place 133 mLs (1 enema total) rectally once as needed for severe constipation.   traMADol 50 MG tablet Commonly known as:  ULTRAM Take 1-2 tablets (50-100 mg total) by mouth every 6 (six) hours as needed for moderate pain. What changed:  how much to take  when to take this  reasons to take this            Discharge Care Instructions        Start     Ordered   06/30/17 0000  docusate sodium (COLACE) 100 MG capsule  2 times  daily    Question:   Supervising Provider  Answer:  Gaynelle Arabian   06/29/17 2052   06/30/17 0000  rivaroxaban (XARELTO) 10 MG TABS tablet  Daily with breakfast    Question:  Supervising Provider  Answer:  Gaynelle Arabian   06/29/17 2052   06/29/17 0000  acetaminophen (TYLENOL) 325 MG tablet  Every 6 hours PRN    Question:  Supervising Provider  Answer:  Gaynelle Arabian   06/29/17 2052   06/29/17 0000  bisacodyl (DULCOLAX) 10 MG suppository  Daily PRN    Question:  Supervising Provider  Answer:  Gaynelle Arabian   06/29/17 2052   06/29/17 0000  diphenhydrAMINE (BENADRYL) 12.5 MG/5ML elixir  Every 4 hours PRN    Question:  Supervising Provider  Answer:  Gaynelle Arabian   06/29/17 2052   06/29/17 0000  HYDROmorphone (DILAUDID) 2 MG tablet  Every 4 hours PRN    Question:  Supervising Provider  Answer:  Gaynelle Arabian   06/29/17 2052   06/29/17 0000  methocarbamol (ROBAXIN) 500 MG tablet  Every 6 hours PRN    Question:  Supervising Provider  Answer:  Gaynelle Arabian   06/29/17 2052   06/29/17 0000  ondansetron (ZOFRAN) 4 MG tablet  Every 6 hours PRN    Question:  Supervising Provider  Answer:  Gaynelle Arabian   06/29/17 2052   06/29/17 0000  polyethylene glycol (MIRALAX / Floria Raveling) packet  Daily PRN    Question:  Supervising Provider  Answer:  Gaynelle Arabian   06/29/17 2052   06/29/17 0000  sodium phosphate (FLEET) 7-19 GM/118ML ENEM  Once PRN    Question:  Supervising Provider  Answer:  Gaynelle Arabian   06/29/17 2052   06/29/17 0000  traMADol (ULTRAM) 50 MG tablet  Every 6 hours PRN    Question:  Supervising Provider  Answer:  Gaynelle Arabian   06/29/17 2052   06/29/17 0000  Call MD / Call 911    Comments:  If you experience chest pain or shortness of breath, CALL 911 and be transported to the hospital emergency room.  If you develope a fever above 101 F, pus (white drainage) or increased drainage or redness at the wound, or calf pain, call your surgeon's office.   06/29/17 2052   06/29/17 0000  Discharge  instructions    Comments:  Take Xarelto for two and a half more weeks, then discontinue Xarelto. Once the patient has completed the blood thinner regimen, then take a Baby 81 mg Aspirin daily for three more weeks.   Pick up stool softner and laxative for home use following surgery while on pain medications. Do not submerge incision under water. Please use good hand washing techniques while changing dressing each day. May shower starting three days after surgery. Please use a clean towel to pat the incision dry following showers. Continue to use ice for pain and swelling after surgery. Do not use any lotions or creams on the incision until instructed by your surgeon.  Wear both TED hose on both legs during the day every day for three weeks, but may remove the TED hose at night at home.  Postoperative Constipation Protocol  Constipation - defined medically as fewer than three stools per week and severe constipation as less than one stool per week.  One of the most common issues patients have following surgery is constipation.  Even if you have a regular bowel pattern at home, your normal regimen is likely to be disrupted due to multiple  reasons following surgery.  Combination of anesthesia, postoperative narcotics, change in appetite and fluid intake all can affect your bowels.  In order to avoid complications following surgery, here are some recommendations in order to help you during your recovery period.  Colace (docusate) - Pick up an over-the-counter form of Colace or another stool softener and take twice a day as long as you are requiring postoperative pain medications.  Take with a full glass of water daily.  If you experience loose stools or diarrhea, hold the colace until you stool forms back up.  If your symptoms do not get better within 1 week or if they get worse, check with your doctor.  Dulcolax (bisacodyl) - Pick up over-the-counter and take as directed by the product packaging as  needed to assist with the movement of your bowels.  Take with a full glass of water.  Use this product as needed if not relieved by Colace only.   MiraLax (polyethylene glycol) - Pick up over-the-counter to have on hand.  MiraLax is a solution that will increase the amount of water in your bowels to assist with bowel movements.  Take as directed and can mix with a glass of water, juice, soda, coffee, or tea.  Take if you go more than two days without a movement. Do not use MiraLax more than once per day. Call your doctor if you are still constipated or irregular after using this medication for 7 days in a row.  If you continue to have problems with postoperative constipation, please contact the office for further assistance and recommendations.  If you experience "the worst abdominal pain ever" or develop nausea or vomiting, please contact the office immediatly for further recommendations for treatment.   06/29/17 2052   06/29/17 0000  Diet - low sodium heart healthy     06/29/17 2052   06/29/17 0000  Constipation Prevention    Comments:  Drink plenty of fluids.  Prune juice may be helpful.  You may use a stool softener, such as Colace (over the counter) 100 mg twice a day.  Use MiraLax (over the counter) for constipation as needed.   06/29/17 2052   06/29/17 0000  Increase activity slowly as tolerated     06/29/17 2052   06/29/17 0000  Patient may shower    Comments:  You may shower without a dressing once there is no drainage.  Do not wash over the wound.  If drainage remains, do not shower until drainage stops.   06/29/17 2052   06/29/17 0000  Weight bearing as tolerated    Question Answer Comment  Laterality left   Extremity Lower      06/29/17 2052   06/29/17 0000  Driving restrictions    Comments:  No driving until released by the physician.   06/29/17 2052   06/29/17 0000  Lifting restrictions    Comments:  No lifting until released by the physician.   06/29/17 2052   06/29/17  0000  TED hose    Comments:  Use stockings (TED hose) for 3 weeks on both leg(s).  You may remove them at night for sleeping.   06/29/17 2052   06/29/17 0000  Change dressing    Comments:  Change dressing daily with sterile 4 x 4 inch gauze dressing and apply TED hose. Do not submerge the incision under water.   06/29/17 2052   06/29/17 0000  Do not put a pillow under the knee. Place it under the heel.  06/29/17 2052   06/29/17 0000  Do not sit on low chairs, stoools or toilet seats, as it may be difficult to get up from low surfaces     06/29/17 2052     Follow-up Information    Aluisio, Pilar Plate, MD. Schedule an appointment as soon as possible for a visit on 07/13/2017.   Specialty:  Orthopedic Surgery Contact information: [address] [place] [address] [phone]           Signed: Arlee Muslim, PA-C Orthopaedic Surgery 06/29/2017, 8:54 PM

## 2017-06-29 NOTE — Progress Notes (Signed)
   06/29/17 1500  PT Visit Information  Last PT Received On 06/29/17  Assistance Needed +1  History of Present Illness 81 y.o. female admitted on 06/28/17 for elective L TKA.  Pt with significant PMH of MVP, R TKA, and lobectomy.  Subjective Data  Patient Stated Goal get better  Precautions  Precautions Knee  Precaution Comments verbally reviewed knee precautions   Required Braces or Orthoses Knee Immobilizer - Left  Knee Immobilizer - Left Discontinue once straight leg raise with < 10 degree lag  Restrictions  LLE Weight Bearing WBAT  Pain Assessment  Pain Assessment 0-10  Pain Score 2  Pain Location left knee  Pain Descriptors / Indicators Aching;Sore  Pain Intervention(s) Limited activity within patient's tolerance;Monitored during session;Repositioned  Cognition  Arousal/Alertness Awake/alert  Behavior During Therapy WFL for tasks assessed/performed  Overall Cognitive Status Within Functional Limits for tasks assessed  Bed Mobility  General bed mobility comments NT--OOB to BR with nsgg  Transfers  Overall transfer level Needs assistance  Equipment used Rolling walker (2 wheeled)  Transfers Sit to/from Stand  Sit to Stand Min assist;Min guard  General transfer comment cues for hand placement  Ambulation/Gait  Ambulation/Gait assistance Min guard;Min assist  Ambulation Distance (Feet) 60 Feet  Assistive device Rolling walker (2 wheeled)  Gait Pattern/deviations Step-to pattern;Antalgic;Trunk flexed  General Gait Details cues for sequence and gait progression  Total Joint Exercises  Ankle Circles/Pumps AROM;Both;20 reps  Quad Sets AROM;Strengthening;Both;10 reps  PT - End of Session  Equipment Utilized During Treatment Gait belt;Left knee immobilizer  Activity Tolerance Patient tolerated treatment well  Patient left in chair;with call bell/phone within reach  PT - Assessment/Plan  PT Plan Current plan remains appropriate  PT Visit Diagnosis Muscle weakness (generalized)  (M62.81);Difficulty in walking, not elsewhere classified (R26.2);Pain  Pain - Right/Left Left  Pain - part of body Knee  PT Frequency (ACUTE ONLY) 7X/week  Follow Up Recommendations SNF;DC plan and follow up therapy as arranged by surgeon  PT equipment None recommended by PT  AM-PAC PT "6 Clicks" Daily Activity Outcome Measure  Difficulty turning over in bed (including adjusting bedclothes, sheets and blankets)? 1  Difficulty moving from lying on back to sitting on the side of the bed?  1  Difficulty sitting down on and standing up from a chair with arms (e.g., wheelchair, bedside commode, etc,.)? 1  Help needed moving to and from a bed to chair (including a wheelchair)? 3  Help needed walking in hospital room? 3  Help needed climbing 3-5 steps with a railing?  2  6 Click Score 11  Mobility G Code  CL  PT Goal Progression  Progress towards PT goals Progressing toward goals  Acute Rehab PT Goals  PT Goal Formulation With patient  Time For Goal Achievement 07/05/17  Potential to Achieve Goals Good  PT Time Calculation  PT Start Time (ACUTE ONLY) 1441  PT Stop Time (ACUTE ONLY) 1501  PT Time Calculation (min) (ACUTE ONLY) 20 min  PT General Charges  $$ ACUTE PT VISIT 1 Visit  PT Treatments  $Gait Training 8-22 mins

## 2017-06-29 NOTE — Progress Notes (Signed)
Physical Therapy Treatment Patient Details Name: Maria [name] MRN: [id number] DOB: 11/26/1935 Today's Date: 06/29/2017    History of Present Illness 81 y.o. female admitted on 06/28/17 for elective L TKA.  Pt with significant PMH of MVP, R TKA, and lobectomy.    PT Comments    Pt agreeable to exercises only d/t being too fatigued from OT session earlier today; after exercise focused session pt reported decr pain; left knee flexion grossly 10--65* AAROM, will see again in pm  Follow Up Recommendations  SNF;DC plan and follow up therapy as arranged by surgeon ([place])     Equipment Recommendations  None recommended by PT    Recommendations for Other Services       Precautions / Restrictions Precautions Precautions: Knee Precaution Booklet Issued: Yes (comment) Precaution Comments: verbally reviewed knee precautions  Required Braces or Orthoses: Knee Immobilizer - Left Knee Immobilizer - Left: Discontinue once straight leg raise with < 10 degree lag Restrictions Weight Bearing Restrictions: No LLE Weight Bearing: Weight bearing as tolerated    Mobility  Bed Mobility               General bed mobility comments: NT--pt in chair and did not want to amb or get back to bed  Transfers Overall transfer level: Needs assistance Equipment used: Rolling walker (2 wheeled) Transfers: Sit to/from Stand Sit to Stand: Min assist         General transfer comment: MinA to rise and steady; verbal cues for hand/LE placement   Ambulation/Gait                 Stairs            Wheelchair Mobility    Modified Rankin (Stroke Patients Only)       Balance Overall balance assessment: Needs assistance Sitting-balance support: Feet supported;No upper extremity supported Sitting balance-Leahy Scale: Good     Standing balance support: Bilateral upper extremity supported Standing balance-Leahy Scale: Poor Standing balance comment:  reliance on RW                             Cognition Arousal/Alertness: Awake/alert Behavior During Therapy: WFL for tasks assessed/performed Overall Cognitive Status: Within Functional Limits for tasks assessed                                        Exercises Total Joint Exercises Ankle Circles/Pumps: AROM;Both;20 reps Quad Sets: AROM;Strengthening;Both;10 reps Heel Slides: AAROM;10 reps;Left Hip ABduction/ADduction: AAROM;Left;10 reps Straight Leg Raises: AAROM;Left;10 reps    General Comments        Pertinent Vitals/Pain Pain Assessment: 0-10 Pain Score: 4  Faces Pain Scale: Hurts little more Pain Location: left knee Pain Descriptors / Indicators: Aching;Sore Pain Intervention(s): Limited activity within patient's tolerance;Monitored during session;Premedicated before session;Ice applied    Home Living Family/patient expects to be discharged to:: Private residence Living Arrangements: Alone Available Help at Discharge: Friend(s);Available PRN/intermittently Type of Home: House (condo) Home Access: Stairs to enter Entrance Stairs-Rails: Right Home Layout: One level Home Equipment: Environmental consultant - 2 wheels;Cane - single point;Bedside commode;Shower seat Additional Comments: plans to borrow the equipment she doesn't have     Prior Function Level of Independence: Independent with assistive device(s)      Comments: used REI walking stick PTA   PT Goals (current goals can now be found in the  care plan section) Acute Rehab PT Goals Patient Stated Goal: get better Time For Goal Achievement: 07/05/17 Potential to Achieve Goals: Good Progress towards PT goals: Progressing toward goals    Frequency    7X/week      PT Plan Current plan remains appropriate    Co-evaluation              AM-PAC PT "6 Clicks" Daily Activity  Outcome Measure  Difficulty turning over in bed (including adjusting bedclothes, sheets and blankets)?: Unable Difficulty moving from  lying on back to sitting on the side of the bed? : Unable Difficulty sitting down on and standing up from a chair with arms (e.g., wheelchair, bedside commode, etc,.)?: Unable Help needed moving to and from a bed to chair (including a wheelchair)?: A Little Help needed walking in hospital room?: A Little Help needed climbing 3-5 steps with a railing? : A Lot 6 Click Score: 11    End of Session Equipment Utilized During Treatment: Gait belt;Left knee immobilizer Activity Tolerance: Patient limited by fatigue Patient left: in chair;with call bell/phone within reach   PT Visit Diagnosis: Muscle weakness (generalized) (M62.81);Difficulty in walking, not elsewhere classified (R26.2);Pain Pain - Right/Left: Left Pain - part of body: Knee     Time: 0761-5183 PT Time Calculation (min) (ACUTE ONLY): 19 min  Charges:  $Therapeutic Exercise: 8-22 mins                    G CodesKenyon Ana, PT Pager: [phone] 06/29/2017    [facility] 06/29/2017, 10:46 AM

## 2017-06-29 NOTE — NC FL2 (Signed)
[place] LEVEL OF CARE SCREENING TOOL     IDENTIFICATION  Patient Name: Maria [name] Birthdate: [birth year] Sex: female Admission Date (Current Location): 06/28/2017  [facility] and [place] Number:  Herbalist and Address:  [facility],  [place] [address] [place]      Provider Number: [id number]  Attending Physician Name and Address:  Gaynelle Arabian, MD  Relative Name and Phone Number:       Current Level of Care: Hospital Recommended Level of Care: [place] Prior Approval Number:    Date Approved/Denied:   PASRR Number: [id number]  Discharge Plan: SNF    Current Diagnoses: Patient Active Problem List   Diagnosis Date Noted  . Preventative health care 06/16/2016  . Knee pain 06/16/2016  . OSA (obstructive sleep apnea) 06/15/2016  . Depression 05/14/2016  . Multiple thyroid nodules 11/24/2015  . Degenerative arthritis of lumbar spine 09/03/2014  . Hyponatremia 12/12/2013  . Hypokalemia 12/12/2013  . OA (osteoarthritis) of knee 12/11/2013  . Osteoarthritis 11/14/2013  . BENIGN POSITIONAL VERTIGO 01/17/2010  . Hyperlipidemia, mixed 02/04/2009  . Vitamin D deficiency 10/23/2008  . POLYARTHRALGIA 10/23/2008  . Osteoporosis 10/23/2008  . NEOPLASM, MALIGNANT, LUNG, NON-SMALL CELL 11/21/2007  . DIVERTICULOSIS 06/15/2007  . GOITER, NODULAR 04/14/2007    Orientation RESPIRATION BLADDER Height & Weight     Self, Time, Situation, Place  Normal Continent Weight: 163 lb (73.9 kg) Height:  5\' 6"  (167.6 cm)  BEHAVIORAL SYMPTOMS/MOOD NEUROLOGICAL BOWEL NUTRITION STATUS      Continent Diet (regular diet thin fluid consistency)  AMBULATORY STATUS COMMUNICATION OF NEEDS Skin   Extensive Assist Verbally Surgical wounds    closed incision left knee, compression wrap                   Personal Care Assistance Level of Assistance  Bathing, Feeding, Dressing Bathing Assistance: Limited assistance Feeding  assistance: Independent Dressing Assistance: Independent     Functional Limitations Info  Sight, Hearing, Speech Sight Info: Adequate Hearing Info: Adequate Speech Info: Adequate    SPECIAL CARE FACTORS FREQUENCY  PT (By licensed PT), OT (By licensed OT)     PT Frequency: 5x OT Frequency: 5x            Contractures Contractures Info: Not present    Additional Factors Info  Code Status, Allergies Code Status Info: full code Allergies Info: Morphine And Related, Oxycodone, Sulfonamide Derivatives           Current Medications (06/29/2017):  This is the current hospital active medication list Current Facility-Administered Medications  Medication Dose Route Frequency Provider Last Rate Last Dose  . 0.9 %  sodium chloride infusion   Intravenous Continuous Perkins, Alexzandrew L, PA-C 75 mL/hr at 06/28/17 1551    . acetaminophen (TYLENOL) tablet 650 mg  650 mg Oral Q6H PRN Rishik Tubby, Pilar Plate, MD       Or  . acetaminophen (TYLENOL) suppository 650 mg  650 mg Rectal Q6H PRN Verla Bryngelson, Pilar Plate, MD      . acetaminophen (TYLENOL) tablet 1,000 mg  1,000 mg Oral Q6H Gaynelle Arabian, MD   1,000 mg at 06/29/17 0139  . bisacodyl (DULCOLAX) suppository 10 mg  10 mg Rectal Daily PRN Gaynelle Arabian, MD      . diphenhydrAMINE (BENADRYL) 12.5 MG/5ML elixir 12.5-25 mg  12.5-25 mg Oral Q4H PRN Nimra Puccinelli, Pilar Plate, MD      . docusate sodium (COLACE) capsule 100 mg  100 mg Oral BID Gaynelle Arabian, MD  100 mg at 06/29/17 0933  . DULoxetine (CYMBALTA) DR capsule 30 mg  30 mg Oral Daily Gaynelle Arabian, MD   30 mg at 06/29/17 0933  . folic acid (FOLVITE) tablet 1 mg  1 mg Oral Daily Gaynelle Arabian, MD   1 mg at 06/29/17 0934  . HYDROmorphone (DILAUDID) injection 0.5 mg  0.5 mg Intravenous Q2H PRN Gaynelle Arabian, MD   0.5 mg at 06/29/17 0937  . HYDROmorphone (DILAUDID) tablet 2 mg  2 mg Oral Q4H PRN Gaynelle Arabian, MD   2 mg at 06/29/17 3016  . menthol-cetylpyridinium (CEPACOL) lozenge 3 mg  1 lozenge Oral PRN  Sion Reinders, Pilar Plate, MD       Or  . phenol (CHLORASEPTIC) mouth spray 1 spray  1 spray Mouth/Throat PRN Emmersyn Kratzke, Pilar Plate, MD      . methocarbamol (ROBAXIN) tablet 500 mg  500 mg Oral Q6H PRN Gaynelle Arabian, MD   500 mg at 06/29/17 0810   Or  . methocarbamol (ROBAXIN) 500 mg in dextrose 5 % 50 mL IVPB  500 mg Intravenous Q6H PRN Gaynelle Arabian, MD   Stopped at 06/28/17 1515  . metoCLOPramide (REGLAN) tablet 5-10 mg  5-10 mg Oral Q8H PRN Naydeen Speirs, Pilar Plate, MD       Or  . metoCLOPramide (REGLAN) injection 5-10 mg  5-10 mg Intravenous Q8H PRN Jo Cerone, Pilar Plate, MD      . ondansetron (ZOFRAN) tablet 4 mg  4 mg Oral Q6H PRN Gaynelle Arabian, MD       Or  . ondansetron (ZOFRAN) injection 4 mg  4 mg Intravenous Q6H PRN Sreya Froio, Pilar Plate, MD      . polyethylene glycol (MIRALAX / GLYCOLAX) packet 17 g  17 g Oral Daily PRN Mackenzy Grumbine, Pilar Plate, MD      . rivaroxaban Alveda Reasons) tablet 10 mg  10 mg Oral Q breakfast Gaynelle Arabian, MD   10 mg at 06/29/17 0934  . sodium phosphate (FLEET) 7-19 GM/118ML enema 1 enema  1 enema Rectal Once PRN Dajon Rowe, Pilar Plate, MD      . traMADol Veatrice Bourbon) tablet 50-100 mg  50-100 mg Oral Q6H PRN Gaynelle Arabian, MD         Discharge Medications: Please see discharge summary for a list of discharge medications.  Relevant Imaging Results:  Relevant Lab Results:   Additional Information SS# [id number]  Nila Nephew, LCSW

## 2017-06-30 LAB — CBC
HCT: 42.7 % (ref 36.0–46.0)
Hemoglobin: 14.8 g/dL (ref 12.0–15.0)
MCH: 30.8 pg (ref 26.0–34.0)
MCHC: 34.7 g/dL (ref 30.0–36.0)
MCV: 89 fL (ref 78.0–100.0)
Platelets: 272 10*3/uL (ref 150–400)
RBC: 4.8 MIL/uL (ref 3.87–5.11)
RDW: 12.8 % (ref 11.5–15.5)
WBC: 15.7 10*3/uL — ABNORMAL HIGH (ref 4.0–10.5)

## 2017-06-30 LAB — BASIC METABOLIC PANEL
Anion gap: 13 (ref 5–15)
Anion gap: 8 (ref 5–15)
BUN: 11 mg/dL (ref 6–20)
BUN: 15 mg/dL (ref 6–20)
CO2: 25 mmol/L (ref 22–32)
CO2: 30 mmol/L (ref 22–32)
Calcium: 8.9 mg/dL (ref 8.9–10.3)
Calcium: 9.3 mg/dL (ref 8.9–10.3)
Chloride: 100 mmol/L — ABNORMAL LOW (ref 101–111)
Chloride: 98 mmol/L — ABNORMAL LOW (ref 101–111)
Creatinine, Ser: 0.67 mg/dL (ref 0.44–1.00)
Creatinine, Ser: 0.74 mg/dL (ref 0.44–1.00)
GFR calc Af Amer: >60 mL/min (ref >60)
GFR calc Af Amer: >60 mL/min (ref >60)
GFR calc non Af Amer: >60 mL/min (ref >60)
GFR calc non Af Amer: >60 mL/min (ref >60)
Glucose, Bld: 209 mg/dL — ABNORMAL HIGH (ref 65–99)
Glucose, Bld: 133 mg/dL — ABNORMAL HIGH (ref 65–99)
Potassium: 2.7 mmol/L — CL (ref 3.5–5.1)
Potassium: 4.2 mmol/L (ref 3.5–5.1)
Sodium: 138 mmol/L (ref 135–145)
Sodium: 136 mmol/L (ref 135–145)

## 2017-06-30 MED ORDER — POTASSIUM CHLORIDE CRYS ER 20 MEQ PO TBCR
40.0000 meq | EXTENDED_RELEASE_TABLET | ORAL | Status: AC
  Administered 2017-06-30 (×3): 40 meq via ORAL
  Filled 2017-06-30 (×4): qty 2

## 2017-06-30 NOTE — Progress Notes (Signed)
Occupational Therapy Treatment Patient Details Name: Maria [name] MRN: [id number] DOB: 08-Jun-1936 Today's Date: 06/30/2017    History of present illness 81 y.o. female admitted on 06/28/17 for elective L TKA.  Pt with significant PMH of MVP, R TKA, and lobectomy.   OT comments  Pt progressing towards goals, completing room level functional mobility, toilet transfer and standing grooming ADLs with MinGuard assist using RW. MinA for toileting and required MaxA for LB dressing. Will continue to follow acutely to progress Pt's safety and independence with ADLs and functional mobility.    Follow Up Recommendations  DC plan and follow up therapy as arranged by surgeon;Other (comment);SNF (Pt planning to d/c to SNF)    Equipment Recommendations  Other (comment) (defer to next venue )          Precautions / Restrictions Precautions Precautions: Knee Precaution Comments: verbally reviewed knee precautions and donning/doffing/rationale for KI Required Braces or Orthoses: Knee Immobilizer - Left Knee Immobilizer - Left: Discontinue once straight leg raise with < 10 degree lag Restrictions Weight Bearing Restrictions: No LLE Weight Bearing: Weight bearing as tolerated       Mobility Bed Mobility Overal bed mobility: Needs Assistance Bed Mobility: Supine to Sit     Supine to sit: Min assist     General bed mobility comments: OOB in recliner   Transfers Overall transfer level: Needs assistance Equipment used: Rolling walker (2 wheeled) Transfers: Sit to/from Stand Sit to Stand: Min guard         General transfer comment: cues for hand placement and LLE     Balance             Standing balance-Leahy Scale: Fair Standing balance comment: stood at sink to wash hands                            ADL either performed or assessed with clinical judgement   ADL Overall ADL's : Needs assistance/impaired     Grooming: Min guard;Wash/dry hands;Standing                Lower Body Dressing: Maximal assistance;Sit to/from stand Lower Body Dressing Details (indicate cue type and reason): MaxA for donning TEDs and footwear  Toilet Transfer: Ambulation;BSC;RW;Min guard Toilet Transfer Details (indicate cue type and reason): BSC over toilet  Toileting- Clothing Manipulation and Hygiene: Minimal assistance;Sit to/from stand       Functional mobility during ADLs: Min guard;Rolling walker                         Cognition Arousal/Alertness: Awake/alert Behavior During Therapy: WFL for tasks assessed/performed Overall Cognitive Status: Within Functional Limits for tasks assessed                                                           Pertinent Vitals/ Pain       Pain Assessment: Faces Pain Score: 4  Faces Pain Scale: Hurts a little bit Pain Location: left knee Pain Descriptors / Indicators: Aching;Sore Pain Intervention(s): Monitored during session;Repositioned;Ice applied  Frequency  Min 2X/week        Progress Toward Goals  OT Goals(current goals can now be found in the care plan section)  Progress towards OT goals: Progressing toward goals  Acute Rehab OT Goals Patient Stated Goal: get better OT Goal Formulation: With patient Time For Goal Achievement: 07/13/17 Potential to Achieve Goals: Good  Plan Discharge plan remains appropriate                     AM-PAC PT "6 Clicks" Daily Activity     Outcome Measure   Help from another person eating meals?: None Help from another person taking care of personal grooming?: A Little Help from another person toileting, which includes using toliet, bedpan, or urinal?: A Little Help from another person bathing (including washing, rinsing, drying)?: A Lot Help from another person to put on and taking off regular upper body clothing?: A Little Help from  another person to put on and taking off regular lower body clothing?: A Lot 6 Click Score: 17    End of Session Equipment Utilized During Treatment: Gait belt;Rolling walker  OT Visit Diagnosis: Unsteadiness on feet (R26.81);Muscle weakness (generalized) (M62.81)   Activity Tolerance Patient tolerated treatment well   Patient Left in chair;with call bell/phone within reach;with nursing/sitter in room (NT)   Nurse Communication Mobility status        Time: 8756-4332 OT Time Calculation (min): 21 min  Charges: OT General Charges $OT Visit: 1 Visit OT Treatments $Self Care/Home Management : 8-22 mins  Lou Cal, OT Pager [phone] 06/30/2017    Raymondo Band 06/30/2017, 2:35 PM

## 2017-06-30 NOTE — Progress Notes (Signed)
   Subjective: 2 Days Post-Op Procedure(s) (LRB): LEFT TOTAL KNEE ARTHROPLASTY (Left) Patient reports pain as mild.   Patient seen in rounds by Dr. Wynelle Link. Patient is well, but has had some minor complaints of pain in the knee, requiring pain medications Patient is ready to go home  Objective: Vital signs in last 24 hours: Temp:  [97.5 F (36.4 C)-98.1 F (36.7 C)] 98 F (36.7 C) (08/29 0626) Pulse Rate:  [84-110] 109 (08/29 0626) Resp:  [16-18] 18 (08/28 1313) BP: (140-170)/(90-101) 148/99 (08/29 0626) SpO2:  [94 %-98 %] 97 % (08/29 0626)  Intake/Output from previous day:  Intake/Output Summary (Last 24 hours) at 06/30/17 0710 Last data filed at 06/30/17 0602  Gross per 24 hour  Intake          1286.25 ml  Output             3400 ml  Net         -2113.75 ml    Intake/Output this shift: No intake/output data recorded.  Labs:  Recent Labs  06/29/17 0512 06/30/17 0508  HGB 13.3 14.8    Recent Labs  06/29/17 0512 06/30/17 0508  WBC 11.3* 15.7*  RBC 4.34 4.80  HCT 38.7 42.7  PLT 240 272    Recent Labs  06/29/17 0512 06/30/17 0541  NA 139 138  K 3.4* 2.7*  CL 102 100*  CO2 27 25  BUN 12 11  CREATININE 0.69 0.67  GLUCOSE 132* 209*  CALCIUM 8.8* 8.9   No results for input(s): LABPT, INR in the last 72 hours.  EXAM: General - Patient is Alert and Appropriate Extremity - Neurovascular intact Sensation intact distally Incision - clean, dry Motor Function - intact, moving foot and toes well on exam.   Assessment/Plan: 2 Days Post-Op Procedure(s) (LRB): LEFT TOTAL KNEE ARTHROPLASTY (Left) Procedure(s) (LRB): LEFT TOTAL KNEE ARTHROPLASTY (Left) Past Medical History:  Diagnosis Date  . Complication of anesthesia    slow to awaken  . Depression   . Diverticulosis   . Goiter, nodular    [place] of thyroid stable 7/08  . Headache    hx of  . Heart murmur    due to rheumatic fever as child  . Mitral valve prolapse   . Multiple thyroid nodules  11/24/2015  . Neoplasm of lung, malignant ([place]) 2002   Non-small cell surgical tx  . Osteoarthritis 11/14/2013   2010 intra-articular steroids X 3 S/P Physical Therapy in 12/14 TKR 12/11/13   . Osteopenia    Dr Pamala Hurry  . PONV (postoperative nausea and vomiting)    n/v  . Sleep apnea   . Vitamin D deficiency    Principal Problem:   OA (osteoarthritis) of knee  Estimated body mass index is 26.31 kg/m as calculated from the following:   Height as of this encounter: 5\' 6"  (1.676 m).   Weight as of this encounter: 73.9 kg (163 lb). Discharge to SNF Diet - Regular diet Follow up - in 2 weeks Activity - WBAT Disposition - Skilled nursing facility Condition Upon Discharge - Stable D/C Meds - See DC Summary DVT Prophylaxis - Xarelto  Arlee Muslim, PA-C Orthopaedic Surgery 06/30/2017, 7:10 AM

## 2017-06-30 NOTE — Progress Notes (Signed)
Patient transferred to [facility] via [place]. All belongings w/ patient. Report called to [facility]. PIV d/c'd tip intact. Patient verbalized understanding of instructions.

## 2017-06-30 NOTE — Progress Notes (Signed)
   06/30/17 1300  PT Visit Information  Last PT Received On 06/30/17  Assistance Needed +1  History of Present Illness 81 y.o. female admitted on 06/28/17 for elective L TKA.  Pt with significant PMH of MVP, R TKA, and lobectomy.  Subjective Data  Patient Stated Goal get better  Precautions  Precautions Knee  Pain Assessment  Pain Assessment 0-10  Pain Score 4  Pain Location left knee  Pain Descriptors / Indicators Aching;Sore  Pain Intervention(s) Limited activity within patient's tolerance;Monitored during session;Premedicated before session;Ice applied  Cognition  Arousal/Alertness Awake/alert  Behavior During Therapy WFL for tasks assessed/performed  Overall Cognitive Status Within Functional Limits for tasks assessed  Total Joint Exercises  Ankle Circles/Pumps AROM;Both;20 reps  Quad Sets AROM;Strengthening;Both;10 reps  Heel Slides AAROM;10 reps;Left  Hip ABduction/ADduction AAROM;Left;10 reps  Straight Leg Raises AAROM;Left;10 reps  Goniometric ROM grossly 10* to 75*   PT - End of Session  Activity Tolerance Patient tolerated treatment well  Patient left in chair;with call bell/phone within reach;with chair alarm set  PT - Assessment/Plan  PT Plan Current plan remains appropriate  PT Visit Diagnosis Muscle weakness (generalized) (M62.81);Difficulty in walking, not elsewhere classified (R26.2);Pain  Pain - Right/Left Left  Pain - part of body Knee  PT Frequency (ACUTE ONLY) 7X/week  Follow Up Recommendations SNF;DC plan and follow up therapy as arranged by surgeon  PT equipment None recommended by PT  AM-PAC PT "6 Clicks" Daily Activity Outcome Measure  Difficulty turning over in bed (including adjusting bedclothes, sheets and blankets)? 1  Difficulty moving from lying on back to sitting on the side of the bed?  1  Difficulty sitting down on and standing up from a chair with arms (e.g., wheelchair, bedside commode, etc,.)? 3  Help needed moving to and from a bed to chair  (including a wheelchair)? 3  Help needed walking in hospital room? 3  Help needed climbing 3-5 steps with a railing?  2  6 Click Score 13  Mobility G Code  CK  PT Goal Progression  Progress towards PT goals Progressing toward goals  Acute Rehab PT Goals  PT Goal Formulation With patient  Time For Goal Achievement 07/05/17  Potential to Achieve Goals Good  PT Time Calculation  PT Start Time (ACUTE ONLY) 1158  PT Stop Time (ACUTE ONLY) 1219  PT Time Calculation (min) (ACUTE ONLY) 21 min  PT General Charges  $$ ACUTE PT VISIT 1 Visit  PT Treatments  $Therapeutic Exercise 8-22 mins

## 2017-06-30 NOTE — Progress Notes (Signed)
Physical Therapy Treatment Patient Details Name: Maria [name] MRN: [id number] DOB: [birth year] Today's Date: 06/30/2017    History of Present Illness 81 y.o. female admitted on 06/28/17 for elective L TKA.  Pt with significant PMH of MVP, R TKA, and lobectomy.    PT Comments    Pt progressing and motivated,  HEP deferred d/t pt just getting pain meds; will see again in pm for exercises  Follow Up Recommendations  SNF;DC plan and follow up therapy as arranged by surgeon     Equipment Recommendations  None recommended by PT    Recommendations for Other Services       Precautions / Restrictions Precautions Precautions: Knee Precaution Comments: verbally reviewed knee precautions and donning/doffing/rationale for KI Required Braces or Orthoses: Knee Immobilizer - Left Knee Immobilizer - Left: Discontinue once straight leg raise with < 10 degree lag Restrictions Weight Bearing Restrictions: No LLE Weight Bearing: Weight bearing as tolerated    Mobility  Bed Mobility Overal bed mobility: Needs Assistance Bed Mobility: Supine to Sit     Supine to sit: Min assist     General bed mobility comments: light min assist with LLE  Transfers Overall transfer level: Needs assistance Equipment used: Rolling walker (2 wheeled) Transfers: Sit to/from Stand Sit to Stand: Min guard         General transfer comment: cues for hand placement and LLE   Ambulation/Gait Ambulation/Gait assistance: Min guard;Min assist Ambulation Distance (Feet): 85 Feet Assistive device: Rolling walker (2 wheeled) Gait Pattern/deviations: Step-to pattern;Antalgic;Trunk flexed     General Gait Details: cues for sequence and gait progression   Stairs            Wheelchair Mobility    Modified Rankin (Stroke Patients Only)       Balance             Standing balance-Leahy Scale: Fair Standing balance comment: stood at sink to wash hands and brush teeth                             Cognition Arousal/Alertness: Awake/alert Behavior During Therapy: WFL for tasks assessed/performed Overall Cognitive Status: Within Functional Limits for tasks assessed                                        Exercises      General Comments        Pertinent Vitals/Pain Pain Assessment: 0-10 Pain Score: 3  Pain Location: left knee Pain Descriptors / Indicators: Aching;Sore Pain Intervention(s): Limited activity within patient's tolerance;Monitored during session;Premedicated before session;Ice applied    Home Living                      Prior Function            PT Goals (current goals can now be found in the care plan section) Acute Rehab PT Goals Patient Stated Goal: get better PT Goal Formulation: With patient Time For Goal Achievement: 07/05/17 Potential to Achieve Goals: Good Progress towards PT goals: Progressing toward goals    Frequency    7X/week      PT Plan Current plan remains appropriate    Co-evaluation              AM-PAC PT "6 Clicks" Daily Activity  Outcome Measure  Difficulty turning over in bed (  including adjusting bedclothes, sheets and blankets)?: Unable Difficulty moving from lying on back to sitting on the side of the bed? : Unable Difficulty sitting down on and standing up from a chair with arms (e.g., wheelchair, bedside commode, etc,.)?: A Little Help needed moving to and from a bed to chair (including a wheelchair)?: A Little Help needed walking in hospital room?: A Little Help needed climbing 3-5 steps with a railing? : A Lot 6 Click Score: 13    End of Session Equipment Utilized During Treatment: Gait belt;Left knee immobilizer Activity Tolerance: Patient tolerated treatment well Patient left: in chair;with call bell/phone within reach;with chair alarm set   PT Visit Diagnosis: Muscle weakness (generalized) (M62.81);Difficulty in walking, not elsewhere classified  (R26.2);Pain Pain - Right/Left: Left Pain - part of body: Knee     Time: 6160-7371 PT Time Calculation (min) (ACUTE ONLY): 30 min  Charges:  $Gait Training: 23-37 mins                    G CodesKenyon Ana, PT Pager: [phone] 06/30/2017    Kenyon Ana 06/30/2017, 11:46 AM

## 2017-06-30 NOTE — Progress Notes (Signed)
D/C summary received.  [facility] called for transport. No ETA.   Kathrin Greathouse, Latanya Presser, MSW Clinical Social Worker 5E and Psychiatric Service Line [phone] 06/30/2017  3:00 PM

## 2017-06-30 NOTE — Progress Notes (Signed)
Critical potassium this morning of 2.7, Dr. Wynelle Link on the floor, notified and stated that he would order supplements for potassium replacement.

## 2017-07-13 ENCOUNTER — Telehealth: Payer: Self-pay | Admitting: *Deleted

## 2017-07-13 NOTE — Telephone Encounter (Signed)
Received request for Medical Records from [facility]; forwarded to provider/SLS 09/11

## 2017-07-22 ENCOUNTER — Ambulatory Visit (INDEPENDENT_AMBULATORY_CARE_PROVIDER_SITE_OTHER): Payer: Medicare Other | Admitting: Family Medicine

## 2017-07-22 ENCOUNTER — Encounter: Payer: Self-pay | Admitting: Family Medicine

## 2017-07-22 DIAGNOSIS — R32 Unspecified urinary incontinence: Secondary | ICD-10-CM

## 2017-07-22 DIAGNOSIS — M810 Age-related osteoporosis without current pathological fracture: Secondary | ICD-10-CM

## 2017-07-22 DIAGNOSIS — E782 Mixed hyperlipidemia: Secondary | ICD-10-CM

## 2017-07-22 DIAGNOSIS — M25562 Pain in left knee: Secondary | ICD-10-CM | POA: Diagnosis not present

## 2017-07-22 DIAGNOSIS — E871 Hypo-osmolality and hyponatremia: Secondary | ICD-10-CM | POA: Diagnosis not present

## 2017-07-22 DIAGNOSIS — E559 Vitamin D deficiency, unspecified: Secondary | ICD-10-CM | POA: Diagnosis not present

## 2017-07-22 LAB — COMPREHENSIVE METABOLIC PANEL
ALT: 13 U/L (ref 0–35)
AST: 22 U/L (ref 0–37)
Albumin: 4.2 g/dL (ref 3.5–5.2)
Alkaline Phosphatase: 92 U/L (ref 39–117)
BUN: 9 mg/dL (ref 6–23)
CO2: 33 mEq/L — ABNORMAL HIGH (ref 19–32)
Calcium: 9.7 mg/dL (ref 8.4–10.5)
Chloride: 97 mEq/L (ref 96–112)
Creatinine, Ser: 0.65 mg/dL (ref 0.40–1.20)
GFR: 93.3 mL/min (ref >60.00)
Glucose, Bld: 87 mg/dL (ref 70–99)
Potassium: 3.6 mEq/L (ref 3.5–5.1)
Sodium: 136 mEq/L (ref 135–145)
Total Bilirubin: 0.8 mg/dL (ref 0.2–1.2)
Total Protein: 7.3 g/dL (ref 6.0–8.3)

## 2017-07-22 LAB — VITAMIN D 25 HYDROXY (VIT D DEFICIENCY, FRACTURES): VITD: 38.1 ng/mL (ref 30.00–100.00)

## 2017-07-22 LAB — URINALYSIS
Bilirubin Urine: NEGATIVE
Hgb urine dipstick: NEGATIVE
Ketones, ur: NEGATIVE
Leukocytes, UA: NEGATIVE
Nitrite: NEGATIVE
Specific Gravity, Urine: <=1.005 — AB (ref 1.000–1.030)
Total Protein, Urine: NEGATIVE
Urine Glucose: NEGATIVE
Urobilinogen, UA: 0.2 (ref 0.0–1.0)
pH: 6.5 (ref 5.0–8.0)

## 2017-07-22 LAB — CBC WITH DIFFERENTIAL/PLATELET
Basophils Absolute: 0.1 10*3/uL (ref 0.0–0.1)
Basophils Relative: 0.8 % (ref 0.0–3.0)
Eosinophils Absolute: 0.1 10*3/uL (ref 0.0–0.7)
Eosinophils Relative: 1.4 % (ref 0.0–5.0)
HCT: 45.7 % (ref 36.0–46.0)
Hemoglobin: 15.1 g/dL — ABNORMAL HIGH (ref 12.0–15.0)
Lymphocytes Relative: 32.9 % (ref 12.0–46.0)
Lymphs Abs: 2.6 10*3/uL (ref 0.7–4.0)
MCHC: 33.1 g/dL (ref 30.0–36.0)
MCV: 94.2 fl (ref 78.0–100.0)
Monocytes Absolute: 0.9 10*3/uL (ref 0.1–1.0)
Monocytes Relative: 10.7 % (ref 3.0–12.0)
Neutro Abs: 4.3 10*3/uL (ref 1.4–7.7)
Neutrophils Relative %: 54.2 % (ref 43.0–77.0)
Platelets: 366 10*3/uL (ref 150.0–400.0)
RBC: 4.85 Mil/uL (ref 3.87–5.11)
RDW: 13 % (ref 11.5–15.5)
WBC: 7.9 10*3/uL (ref 4.0–10.5)

## 2017-07-22 LAB — LIPID PANEL
Cholesterol: 212 mg/dL — ABNORMAL HIGH (ref 0–200)
HDL: 62.8 mg/dL (ref >39.00)
LDL Cholesterol: 121 mg/dL — ABNORMAL HIGH (ref 0–99)
NonHDL: 149.46
Total CHOL/HDL Ratio: 3
Triglycerides: 140 mg/dL (ref 0.0–149.0)
VLDL: 28 mg/dL (ref 0.0–40.0)

## 2017-07-22 LAB — TSH: TSH: 2.02 u[IU]/mL (ref 0.35–4.50)

## 2017-07-22 NOTE — Assessment & Plan Note (Signed)
Low in hospital, feeling a little fatigued and achy today

## 2017-07-22 NOTE — Patient Instructions (Addendum)
Miralax and benefiber can mix together once or twice daily as needed  Recommend calcium intake of 1200 to 1500 mg daily, divided into roughly 3 doses. Best source is the diet and a single dairy serving is about 500 mg, a supplement of calcium citrate once or twice daily to balance diet is fine if not getting enough in diet. Also need Vitamin D 2000 IU caps, 1 cap daily if not already taking vitamin D. Also recommend weight baring exercise on hips and upper body to keep bones strong Constipation, Adult Constipation is when a person:  Poops (has a bowel movement) fewer times in a week than normal.  Has a hard time pooping.  Has poop that is dry, hard, or bigger than normal.  Follow these instructions at home: Eating and drinking   Eat foods that have a lot of fiber, such as: ? Fresh fruits and vegetables. ? Whole grains. ? Beans.  Eat less of foods that are high in fat, low in fiber, or overly processed, such as: ? [place] fries. ? Hamburgers. ? Cookies. ? Candy. ? Soda.  Drink enough fluid to keep your pee (urine) clear or pale yellow. General instructions  Exercise regularly or as told by your doctor.  Go to the restroom when you feel like you need to poop. Do not hold it in.  Take over-the-counter and prescription medicines only as told by your doctor. These include any fiber supplements.  Do pelvic floor retraining exercises, such as: ? Doing deep breathing while relaxing your lower belly (abdomen). ? Relaxing your pelvic floor while pooping.  Watch your condition for any changes.  Keep all follow-up visits as told by your doctor. This is important. Contact a doctor if:  You have pain that gets worse.  You have a fever.  You have not pooped for 4 days.  You throw up (vomit).  You are not hungry.  You lose weight.  You are bleeding from the anus.  You have thin, pencil-like poop (stool). Get help right away if:  You have a fever, and your symptoms  suddenly get worse.  You leak poop or have blood in your poop.  Your belly feels hard or bigger than normal (is bloated).  You have very bad belly pain.  You feel dizzy or you faint. This information is not intended to replace advice given to you by your health care provider. Make sure you discuss any questions you have with your health care provider. Document Released: 04/06/2008 Document Revised: 05/08/2016 Document Reviewed: 04/08/2016 Elsevier Interactive Patient Education  2017 [employer].

## 2017-07-22 NOTE — Assessment & Plan Note (Signed)
Was bad in rehab after surgery but has improved some.

## 2017-07-22 NOTE — Assessment & Plan Note (Signed)
Encouraged heart healthy diet, increase exercise, avoid trans fats, consider a krill oil cap daily 

## 2017-07-22 NOTE — Assessment & Plan Note (Addendum)
Not taking daily vitamin D recently. Level wnl. Encouraged to take 2000 IU daily.

## 2017-07-23 LAB — URINE CULTURE
MICRO NUMBER:: 81041665
SPECIMEN QUALITY:: ADEQUATE

## 2017-07-25 NOTE — Assessment & Plan Note (Signed)
Encouraged to get adequate exercise, calcium and vitamin d intake 

## 2017-07-25 NOTE — Progress Notes (Signed)
Patient ID: Maria [name], female   DOB: June 12, 1936, 81 y.o.   MRN: [id number]   Subjective:    Patient ID: Maria [name], female    DOB: [birth year], 81 y.o.   MRN: [id number]  Chief Complaint  Patient presents with  . Fatigue  . Follow-up    HPI Patient is in today for follow up. She is struggling with fatigue as she recovers from her recent orthopaedic knee surgery. She is healing well but still has some pain and debility. No recent febrile illness or acute hospitalizations. Denies CP/palp/SOB/HA/congestion/fevers/GI or GU c/o. Taking meds as prescribed  Past Medical History:  Diagnosis Date  . Complication of anesthesia    slow to awaken  . Depression   . Diverticulosis   . Goiter, nodular    [place] of thyroid stable 7/08  . Headache    hx of  . Heart murmur    due to rheumatic fever as child  . Mitral valve prolapse   . Multiple thyroid nodules 11/24/2015  . Neoplasm of lung, malignant ([facility]) 2002   Non-small cell surgical tx  . Osteoarthritis 11/14/2013   2010 intra-articular steroids X 3 S/P Physical Therapy in 12/14 TKR 12/11/13   . Osteopenia    Dr Pamala Hurry  . PONV (postoperative nausea and vomiting)    n/v  . Sleep apnea   . Vitamin D deficiency     Past Surgical History:  Procedure Laterality Date  . ABDOMINAL HYSTERECTOMY  1998   No BSO, for dysfunctional menses  . BLADDER SUSPENSION Bilateral   . BREAST BIOPSY Right   . COLONOSCOPY  2009 , 2014   Diverticulosis; Dr. Olevia Perches  . LOBECTOMY  12/02   RU; no radiation or chemo, Dr. Arlyce Dice  . TONSILLECTOMY    . TOTAL KNEE ARTHROPLASTY Right 12/11/2013   Procedure: RIGHT TOTAL KNEE ARTHROPLASTY;  Surgeon: Gearlean Alf, MD;  Location: [facility];  Service: Orthopedics;  Laterality: Right;  . TOTAL KNEE ARTHROPLASTY Left 06/28/2017   Procedure: LEFT TOTAL KNEE ARTHROPLASTY;  Surgeon: Gaynelle Arabian, MD;  Location: [facility];  Service: Orthopedics;  Laterality: Left;  Adductor Block    Family History  Problem  Relation Age of Onset  . COPD Mother   . Coronary artery disease Father   . Colon cancer Father 24  . Heart attack Father 8  . Stroke Father        > 66  . Lung cancer Paternal Aunt        smoker  . Cancer Paternal Aunt        lung  . Lung cancer Maternal Grandfather        smoker  . COPD Maternal Grandfather   . Arthritis Sister   . Lung cancer Maternal Uncle        smoker  . Diabetes Neg Hx     Social History   Social History  . Marital status: Divorced    Spouse name: N/A  . Number of children: N/A  . Years of education: college   Occupational History  . Retired Retired   Social History Main Topics  . Smoking status: Former Smoker    Years: 20.00    Types: Cigarettes    Quit date: 05/03/1979  . Smokeless tobacco: Never Used     Comment: smoked 1962-1980, up to < 1 ppd  . Alcohol use 4.2 oz/week    7 Standard drinks or equivalent per week     Comment: occasional  . Drug use: No  .  Sexual activity: No     Comment: lives alone, dog, no dietary restrictions, eating heart healthy   Other Topics Concern  . Not on file   Social History Narrative   Pt gets regular exercise.   Drinks 2 cups of coffee a day     Outpatient Medications Prior to Visit  Medication Sig Dispense Refill  . acetaminophen (TYLENOL) 325 MG tablet Take 2 tablets (650 mg total) by mouth every 6 (six) hours as needed for mild pain (or Fever >/= 101). 20 tablet 0  . DULoxetine (CYMBALTA) 30 MG capsule TAKE 1 CAPSULE (30 MG TOTAL) BY MOUTH DAILY. INCREASE TO 2 PILLS DAILY AFTER 2 WEEKS 30 capsule 1  . polyethylene glycol (MIRALAX / GLYCOLAX) packet Take 17 g by mouth daily as needed for mild constipation. 14 each 0  . psyllium (METAMUCIL) 58.6 % powder Take 1 packet by mouth daily. 1 teaspoon    . traMADol (ULTRAM) 50 MG tablet Take 1-2 tablets (50-100 mg total) by mouth every 6 (six) hours as needed for moderate pain. 56 tablet 0  . bisacodyl (DULCOLAX) 10 MG suppository Place 1 suppository (10  mg total) rectally daily as needed for moderate constipation. 12 suppository 0  . diphenhydrAMINE (BENADRYL) 12.5 MG/5ML elixir Take 5-10 mLs (12.5-25 mg total) by mouth every 4 (four) hours as needed for itching. 120 mL 0  . docusate sodium (COLACE) 100 MG capsule Take 1 capsule (100 mg total) by mouth 2 (two) times daily. 10 capsule 0  . HYDROmorphone (DILAUDID) 2 MG tablet Take 1 tablet (2 mg total) by mouth every 4 (four) hours as needed for severe pain. 80 tablet 0  . methocarbamol (ROBAXIN) 500 MG tablet Take 1 tablet (500 mg total) by mouth every 6 (six) hours as needed for muscle spasms. 80 tablet 0  . ondansetron (ZOFRAN) 4 MG tablet Take 1 tablet (4 mg total) by mouth every 6 (six) hours as needed for nausea. 20 tablet 0  . rivaroxaban (XARELTO) 10 MG TABS tablet Take 1 tablet (10 mg total) by mouth daily with breakfast. Take Xarelto for two and a half more weeks following discharge from the hospital, then discontinue Xarelto. Once the patient has completed the blood thinner regimen, then take a Baby 81 mg Aspirin daily for three more weeks. 19 tablet 0  . sodium phosphate (FLEET) 7-19 GM/118ML ENEM Place 133 mLs (1 enema total) rectally once as needed for severe constipation. 2 Bottle 0   No facility-administered medications prior to visit.     Allergies  Allergen Reactions  . Morphine And Related     Mental status change  . Oxycodone     Mental status changes post op 12/11/13  . Sulfonamide Derivatives     REACTION: stomach pain    Review of Systems  Constitutional: Positive for malaise/fatigue. Negative for fever.  HENT: Negative for congestion.   Eyes: Negative for blurred vision.  Respiratory: Negative for shortness of breath.   Cardiovascular: Negative for chest pain, palpitations and leg swelling.  Gastrointestinal: Negative for abdominal pain, blood in stool and nausea.  Genitourinary: Negative for dysuria and frequency.  Musculoskeletal: Positive for joint pain.  Negative for falls.  Skin: Negative for rash.  Neurological: Negative for dizziness, loss of consciousness and headaches.  Endo/Heme/Allergies: Negative for environmental allergies.  Psychiatric/Behavioral: Negative for depression. The patient is not nervous/anxious.        Objective:    Physical Exam  Constitutional: She is oriented to person, place, and time.  She appears well-developed and well-nourished. No distress.  HENT:  Head: Normocephalic and atraumatic.  Nose: Nose normal.  Eyes: Right eye exhibits no discharge. Left eye exhibits no discharge.  Neck: Normal range of motion. Neck supple.  Cardiovascular: Normal rate and regular rhythm.   No murmur heard. Pulmonary/Chest: Effort normal and breath sounds normal.  Abdominal: Soft. Bowel sounds are normal. There is no tenderness.  Musculoskeletal: She exhibits no edema.  Neurological: She is alert and oriented to person, place, and time.  Skin: Skin is warm and dry.  Psychiatric: She has a normal mood and affect.  Nursing note and vitals reviewed.   BP 128/88   Pulse 78   Temp 98.7 F (37.1 C) (Oral)   Ht 5\' 6"  (1.676 m)   Wt 158 lb (71.7 kg)   SpO2 98%   BMI 25.50 kg/m  Wt Readings from Last 3 Encounters:  07/22/17 158 lb (71.7 kg)  06/28/17 163 lb (73.9 kg)  06/17/17 163 lb 6.4 oz (74.1 kg)     Lab Results  Component Value Date   WBC 7.9 07/22/2017   HGB 15.1 (H) 07/22/2017   HCT 45.7 07/22/2017   PLT 366.0 07/22/2017   GLUCOSE 87 07/22/2017   CHOL 212 (H) 07/22/2017   TRIG 140.0 07/22/2017   HDL 62.80 07/22/2017   LDLDIRECT 137.2 10/23/2008   LDLCALC 121 (H) 07/22/2017   ALT 13 07/22/2017   AST 22 07/22/2017   NA 136 07/22/2017   K 3.6 07/22/2017   CL 97 07/22/2017   CREATININE 0.65 07/22/2017   BUN 9 07/22/2017   CO2 33 (H) 07/22/2017   TSH 2.02 07/22/2017   INR 0.95 06/17/2017   HGBA1C 5.6 02/13/2014    Lab Results  Component Value Date   TSH 2.02 07/22/2017   Lab Results  Component  Value Date   WBC 7.9 07/22/2017   HGB 15.1 (H) 07/22/2017   HCT 45.7 07/22/2017   MCV 94.2 07/22/2017   PLT 366.0 07/22/2017   Lab Results  Component Value Date   NA 136 07/22/2017   K 3.6 07/22/2017   CO2 33 (H) 07/22/2017   GLUCOSE 87 07/22/2017   BUN 9 07/22/2017   CREATININE 0.65 07/22/2017   BILITOT 0.8 07/22/2017   ALKPHOS 92 07/22/2017   AST 22 07/22/2017   ALT 13 07/22/2017   PROT 7.3 07/22/2017   ALBUMIN 4.2 07/22/2017   CALCIUM 9.7 07/22/2017   ANIONGAP 8 06/30/2017   GFR 93.30 07/22/2017   Lab Results  Component Value Date   CHOL 212 (H) 07/22/2017   Lab Results  Component Value Date   HDL 62.80 07/22/2017   Lab Results  Component Value Date   LDLCALC 121 (H) 07/22/2017   Lab Results  Component Value Date   TRIG 140.0 07/22/2017   Lab Results  Component Value Date   CHOLHDL 3 07/22/2017   Lab Results  Component Value Date   HGBA1C 5.6 02/13/2014       Assessment & Plan:   Problem List Items Addressed This Visit    Vitamin D deficiency    Not taking daily vitamin D recently. Level wnl. Encouraged to take 2000 IU daily.       Relevant Orders   TSH (Completed)   VITAMIN D 25 Hydroxy (Vit-D Deficiency, Fractures) (Completed)   Hyperlipidemia, mixed    Encouraged heart healthy diet, increase exercise, avoid trans fats, consider a krill oil cap daily      Relevant Orders   TSH (  Completed)   Lipid panel (Completed)   Osteoporosis    Encouraged to get adequate exercise, calcium and vitamin d intake      Urinary incontinence    Was bad in rehab after surgery but has improved some.      Relevant Orders   CBC with Differential/Platelet (Completed)   Urinalysis (Completed)   Urine Culture (Completed)   Hyponatremia    Low in hospital, feeling a little fatigued and achy today      Relevant Orders   Comprehensive metabolic panel (Completed)   TSH (Completed)   Knee pain    Is recovering well from knee surgery. She is slowly  improving and may continue Tramadol prn. Is following with orthopaedics.          I have discontinued Ms. Cardella's bisacodyl, diphenhydrAMINE, docusate sodium, HYDROmorphone, methocarbamol, ondansetron, rivaroxaban, and sodium phosphate. I am also having her maintain her psyllium, DULoxetine, acetaminophen, polyethylene glycol, and traMADol.  No orders of the defined types were placed in this encounter.   CMA served as Education administrator during this visit. History, Physical and Plan performed by medical provider. Documentation and orders reviewed and attested to.  Penni Homans, MD

## 2017-07-25 NOTE — Assessment & Plan Note (Signed)
Is recovering well from knee surgery. She is slowly improving and may continue Tramadol prn. Is following with orthopaedics.

## 2017-08-03 ENCOUNTER — Other Ambulatory Visit: Payer: Self-pay | Admitting: Family Medicine

## 2017-08-03 MED ORDER — DULOXETINE HCL 60 MG PO CPEP: 60.0000 mg | ORAL_CAPSULE | Freq: Every day | ORAL | 2 refills | Status: DC

## 2017-08-03 NOTE — Telephone Encounter (Signed)
Pt was instructed to increase Duloxetine 30mg  to 2qd after 2 weeks/when received refill request changed to 60mg  1qd and advised pharm to Torian Quintero/C 30mg /thx dmf

## 2017-08-09 ENCOUNTER — Other Ambulatory Visit: Payer: Self-pay | Admitting: Family Medicine

## 2017-08-09 NOTE — Telephone Encounter (Signed)
Changed to 60mg  1qd & #30+2 faxed on 10.02.2018/thx dmf

## 2017-09-13 ENCOUNTER — Telehealth: Payer: Self-pay | Admitting: *Deleted

## 2017-09-13 NOTE — Telephone Encounter (Signed)
Received Provider Query from [facility] for Medical Record Clarification on Depression for Coding purposes, OV note attached; forwarded to provider/SLS 11/12

## 2017-09-19 ENCOUNTER — Encounter: Payer: Self-pay | Admitting: Family Medicine

## 2017-09-20 ENCOUNTER — Other Ambulatory Visit: Payer: Self-pay | Admitting: Family Medicine

## 2017-09-20 MED ORDER — DULOXETINE HCL 30 MG PO CPEP
30.0000 mg | ORAL_CAPSULE | Freq: Every day | ORAL | 3 refills | Status: DC
Start: 2017-09-20 — End: 2017-12-16

## 2017-12-16 ENCOUNTER — Other Ambulatory Visit: Payer: Self-pay

## 2017-12-16 MED ORDER — DULOXETINE HCL 30 MG PO CPEP: 30.0000 mg | ORAL_CAPSULE | Freq: Every day | ORAL | 3 refills | Status: DC

## 2018-02-14 ENCOUNTER — Ambulatory Visit: Payer: Medicare Other | Admitting: Neurology

## 2018-03-13 ENCOUNTER — Encounter: Payer: Self-pay | Admitting: Neurology

## 2018-03-14 ENCOUNTER — Encounter: Payer: Self-pay | Admitting: Internal Medicine

## 2018-03-16 ENCOUNTER — Encounter: Payer: Self-pay | Admitting: Neurology

## 2018-03-16 ENCOUNTER — Ambulatory Visit: Payer: Medicare Other | Admitting: Neurology

## 2018-03-16 VITALS — BP 140/92 | HR 76 | Ht 66.0 in | Wt 164.0 lb

## 2018-03-16 DIAGNOSIS — Z9989 Dependence on other enabling machines and devices: Secondary | ICD-10-CM | POA: Diagnosis not present

## 2018-03-16 DIAGNOSIS — G4733 Obstructive sleep apnea (adult) (pediatric): Secondary | ICD-10-CM | POA: Diagnosis not present

## 2018-03-16 NOTE — Progress Notes (Signed)
Subjective:    Patient ID: Maria [name] is a 82 y.o. female.  HPI     Interim history:  Maria [name] is a 82 year old right-handed lady with an underlying medical history non-small cell lung cancer, status post lobectomy in 2002, hypertension, depression, thyroid nodules, arthritis of both knees, status post right total knee arthroplasty, hyponatremia, hypokalemia, hyperlipidemia, vitamin D deficiency, osteoporosis, and overweight state, who presents for follow-up consultation of her sleep apnea, established on CPAP therapy. The patient is unaccompanied today and presents for her yearly checkup. I last saw her on 02/11/2017, at which time she reported doing well with CPAP, she was sleeping well with it. She was complaining of left knee pain, she was status post right total knee replacement.   Today, 03/16/2018 (all dictated new, as well as above notes, some dictation done in note pad or Word, outside of chart, may appear as copied):   I reviewed her CPAP compliance data from the last 30 days from 02/12/2018 through 03/13/2018 which is 30 days, during which time she used her CPAP 24 days with percent used days greater than 4 hours at 63%, indicating suboptimal compliance with an average usage of 6 hours and 36 minutes 4 days on treatment, residual AHI at goal at 1.2 per hour, leak acceptable with the 95th percentile at 13.6 L/m on a pressure of 7 cm with EPR of 3. In the past 90 days she has had a 4 hour or more compliant percentage of 70%. Her usage has declined in the past few weeks. She reports that she was dog sitting and housesitting for her friend in [place] for the past 2 months. She also had physical therapy while she was there. She did well with it. Overall, she did well since her left total knee replacement surgery on 06/28/2017 under Dr. Maureen Ralphs. She still is able to walk without a cane. She is doing well after her right total knee replacement as well. She enjoys spending time with her  young grandchildren here. She loves traveling to [place] and has a lot of friends there.    The patient's allergies, current medications, family history, past medical history, past social history, past surgical history and problem list were reviewed and updated as appropriate.    Previously (copied from previous notes for reference):    I saw her on 08/12/2016, at which time we talked about her sleep study results from her baseline sleep study from 05/18/2016 as well as her CPAP titration results from 06/05/2016. She was established on CPAP therapy and compliant with it. She reported that her regarding her sleep with more restful sleep, better sleep consolidation. She was planning to take her CPAP machine to her trip to [place]. Stays busy, particularly with her young grandchildren, her older son's kids (from his second marriage). She has 2 sons (in their 63s).   I reviewed her CPAP compliance data from 01/10/2017 through 02/08/2017, which is a total of 30 days, during which time she used her machine 29 days with percent used days greater than 4 hours at 93%, indicating excellent compliance with an average usage of 6 hours and 21 minutes, residual AHI 1.8 per hour, leak on the higher side for the 95th percentile at 23 L/m, pressure of 7 cm with EPR of 3.    I first met her on 04/28/2016 at the request of her primary care physician, at which time she reported snoring and excessive daytime somnolence, morning headaches, sleep disruption and nonrestorative  sleep. I invited her back for sleep study. She had a baseline sleep study, followed by a CPAP titration study.  Baseline sleep study from 05/18/2016 showed a sleep efficiency of 70.9%, latency to sleep was prolonged at 26 minutes and wake after sleep onset was increased at 91 minutes. She had an increased percentage of light stage sleep, slow-wave sleep was 30.5% and there was no REM sleep. She had occasional PVCs on EKG. She had mild snoring. Total  AHI was elevated at 28.7 per hour. Average oxygen saturation was only 89%, she was treated with supplemental oxygen which was started and 11:55 PM. Based on her sleep-related complaints and her test results I invited her for a full night CPAP titration study.    She had her CPAP study on 06/05/2016. Sleep efficiency was 75.9%, sleep latency was 26 minutes and wake after sleep onset was 71.5 minutes. She had an increased percentage of slow-wave sleep and REM sleep was 6.4%, REM latency markedly prolonged at 226.5 minutes. She had no significant PLMS. CPAP was titrated from 5 cm to 7 cm. AHI was 0 per hour at the final pressure. Average oxygen saturation was 92%, nadir was 87%. Time below 88% saturation was less than 1 minute. Based on her test results are prescribed CPAP therapy for home use.   I reviewed her CPAP compliance data from 07/12/2016 through 08/10/2016 which is a total of 30 days, during which time she used her machine 29 days with percent used days greater than 4 hours at 93%, indicating excellent compliance with an average usage of 5 hours and 55 minutes, residual AHI 2.3 per hour, leak on the higher side with the 95th percentile at 22.4 L/m on a pressure of 7 cm with EPR of 3.   04/28/2016: She reports snoring, excessive daytime somnolence, nonrestorative sleep, morning headaches and sleep disruption. I reviewed your office note from 04/15/2016. She is s/p R TKA in 2015 and has L knee pain, s/p multiple knee injection, sees Dr. Maureen Ralphs.  She goes to bed around 9:30 to 10 PM. She has occasional morning headaches, not enough to take medication, but BP has been a little up in the AMs. She denies restless leg symptoms. She has a rise time around 5 AM. She goes to the bathroom once or twice per average night. She lives alone. She is divorced for about 41 years. She is retired from [employer]. She has 2 grown children and 6 grandchildren. She likes to travel. She stays very active, walks about 2  miles per day. She drinks alcohol occasionally, usually 2 cups of coffee per day, quit smoking in 1980. She recently started Cymbalta 30 mg once daily about 2 weeks ago and feels improved in her mood. She has no TV in the bedroom. She has a small dog sleeps at the end of the bed in a queen size bed. She is not aware of any family history of obstructive sleep apnea. Her main concern is that she is tired during the day and does not wake up rested. Her Epworth sleepiness score is 12 out of 24 today, her fatigue score is 33 out of 63.  Her Past Medical History Is Significant For: Past Medical History:  Diagnosis Date  . Complication of anesthesia    slow to awaken  . Depression   . Diverticulosis   . Goiter, nodular    [place] of thyroid stable 7/08  . Headache    hx of  . Heart murmur  due to rheumatic fever as child  . Mitral valve prolapse   . Multiple thyroid nodules 11/24/2015  . Neoplasm of lung, malignant ([place]) 2002   Non-small cell surgical tx  . Osteoarthritis 11/14/2013   2010 intra-articular steroids X 3 S/P Physical Therapy in 12/14 TKR 12/11/13   . Osteopenia    Dr Pamala Hurry  . PONV (postoperative nausea and vomiting)    n/v  . Sleep apnea   . Vitamin D deficiency     Her Past Surgical History Is Significant For: Past Surgical History:  Procedure Laterality Date  . ABDOMINAL HYSTERECTOMY  1998   No BSO, for dysfunctional menses  . BLADDER SUSPENSION Bilateral   . BREAST BIOPSY Right   . COLONOSCOPY  2009 , 2014   Diverticulosis; Dr. Olevia Perches  . LOBECTOMY  12/02   RU; no radiation or chemo, Dr. Arlyce Dice  . TONSILLECTOMY    . TOTAL KNEE ARTHROPLASTY Right 12/11/2013   Procedure: RIGHT TOTAL KNEE ARTHROPLASTY;  Surgeon: Gearlean Alf, MD;  Location: [facility];  Service: Orthopedics;  Laterality: Right;  . TOTAL KNEE ARTHROPLASTY Left 06/28/2017   Procedure: LEFT TOTAL KNEE ARTHROPLASTY;  Surgeon: Gaynelle Arabian, MD;  Location: [facility];  Service: Orthopedics;  Laterality: Left;   Adductor Block    Her Family History Is Significant For: Family History  Problem Relation Age of Onset  . COPD Mother   . Coronary artery disease Father   . Colon cancer Father 34  . Heart attack Father 72  . Stroke Father        > 23  . Lung cancer Paternal Aunt        smoker  . Cancer Paternal Aunt        lung  . Lung cancer Maternal Grandfather        smoker  . COPD Maternal Grandfather   . Arthritis Sister   . Lung cancer Maternal Uncle        smoker  . Diabetes Neg Hx     Her Social History Is Significant For: Social History   Socioeconomic History  . Marital status: Divorced    Spouse name: Not on file  . Number of children: Not on file  . Years of education: college  . Highest education level: Not on file  Occupational History  . Occupation: Retired    Fish farm manager: RETIRED  Social Needs  . Financial resource strain: Not on file  . Food insecurity:    Worry: Not on file    Inability: Not on file  . Transportation needs:    Medical: Not on file    Non-medical: Not on file  Tobacco Use  . Smoking status: Former Smoker    Years: 20.00    Types: Cigarettes    Last attempt to quit: 05/03/1979    Years since quitting: 38.8  . Smokeless tobacco: Never Used  . Tobacco comment: smoked 1962-1980, up to < 1 ppd  Substance and Sexual Activity  . Alcohol use: Yes    Alcohol/week: 4.2 oz    Types: 7 Standard drinks or equivalent per week    Comment: occasional  . Drug use: No  . Sexual activity: Never    Comment: lives alone, dog, no dietary restrictions, eating heart healthy  Lifestyle  . Physical activity:    Days per week: Not on file    Minutes per session: Not on file  . Stress: Not on file  Relationships  . Social connections:    Talks on phone:  Not on file    Gets together: Not on file    Attends religious service: Not on file    Active member of club or organization: Not on file    Attends meetings of clubs or organizations: Not on file     Relationship status: Not on file  Other Topics Concern  . Not on file  Social History Narrative   Pt gets regular exercise.   Drinks 2 cups of coffee a day     Her Allergies Are:  Allergies  Allergen Reactions  . Morphine And Related     Mental status change  . Oxycodone     Mental status changes post op 12/11/13  . Sulfonamide Derivatives     REACTION: stomach pain  :   Her Current Medications Are:  Outpatient Encounter Medications as of 03/16/2018  Medication Sig  . DULoxetine (CYMBALTA) 30 MG capsule Take 1 capsule (30 mg total) by mouth daily.  . psyllium (METAMUCIL) 58.6 % powder Take 1 packet by mouth daily. 1 teaspoon  . traMADol (ULTRAM) 50 MG tablet Take 1-2 tablets (50-100 mg total) by mouth every 6 (six) hours as needed for moderate pain.  . [DISCONTINUED] acetaminophen (TYLENOL) 325 MG tablet Take 2 tablets (650 mg total) by mouth every 6 (six) hours as needed for mild pain (or Fever >/= 101).  . [DISCONTINUED] polyethylene glycol (MIRALAX / GLYCOLAX) packet Take 17 g by mouth daily as needed for mild constipation.   No facility-administered encounter medications on file as of 03/16/2018.   :  Review of Systems:  Out of a complete 14 point review of systems, all are reviewed and negative with the exception of these symptoms as listed below: Review of Systems  Neurological:       Pt presents today to discuss her cpap . Pt reports that her cpap is going fine. Pt just returned from a 2 month vacation to [facility].    Objective:  Neurological Exam  Physical Exam Physical Examination:   Vitals:   03/16/18 1032  BP: (!) 140/92  Pulse: 76    General Examination: The patient is a very pleasant 82 y.o. female in no acute distress. She appears well-developed and well-nourished and well groomed. Good spirits as usual.  HEENT: Normocephalic, atraumatic, pupils are equal, round and reactive to light and accommodation. She is status post cataract repairs, she has  corrective eyeglasses in place, extraocular tracking is good, hearing is grossly intact. Face is symmetric with normal facial animation and normal facial sensation. Speech is clear with no dysarthria noted. There is no hypophonia. There is no lip, neck/head, jaw or voice tremor. Neck is supple with full range of passive and active motion. Oropharynx exam reveals: mild mouth dryness, adequate dental hygiene and moderate airway crowding. Mallampati is class II. Tongue protrudes centrally and palate elevates symmetrically. Tonsils are absent.   Chest: Clear to auscultation without wheezing, rhonchi or crackles noted.  Heart: S1+S2+0, regular and normal without murmurs, rubs or gallops noted.   Abdomen: Soft, non-tender and non-distended with normal bowel sounds appreciated on auscultation.  Extremities: There is no pitting edema in the distal lower extremities bilaterally. Pedal pulses are intact.  Skin: Warm and dry without trophic changes noted.  Musculoskeletal: exam reveals no obvious joint deformities, tenderness or joint swelling or erythema, status post b/l knee replacement surgery.   Neurologically:  Mental status: The patient is awake, alert and oriented in all 4 spheres. Her immediate and remote memory, attention, language skills  and fund of knowledge are appropriate. There is no evidence of aphasia, agnosia, apraxia or anomia. Speech is clear with normal prosody and enunciation. Thought process is linear. Mood is normal and affect is normal.  Cranial nerves II - XII are as described above under HEENT exam. In addition: shoulder shrug is normal with equal shoulder height noted. Motor exam: Normal bulk, strength and tone is noted. There is no tremor. Reflexes are 1+ throughout. Fine motor skills and coordination: intact.  Cerebellar testing: No dysmetria or intention tremor on finger to nose testing. Heel to shin is unremarkable bilaterally. There is no truncal or gait ataxia.   Sensory exam: intact to light touch in the upper and lower extremities.  Gait, station and balance: She stands with no significant difficulty. Posture is stooped, she has lumbar kyphosis and probably some scoliosis all stable appearing. She walks without a limp, no walking cane. Tandem walk is not possible, Romberg was not tested for safety.                Assessment and Plan:   In summary, Maria [name] is a very pleasant 82 year old female with an underlying medical history non-small cell lung cancer, status post lobectomy in 2002, hypertension, depression, thyroid nodules, arthritis of both knees, status post right total knee arthroplasty, hyponatremia, hypokalemia, hyperlipidemia, vitamin D deficiency, osteoporosis, and overweight state, who presents for follow-up consultation of her sleep apnea, now well established on CPAP therapy at a pressure of 7 cm. She is tolerating the treatment, she feels well with regards to her sleep. She Has done well after her latest knee replacement surgery in August 2018. She continues to be very independent and likes to travel. She is commended for her CPAP adherence. O note, her sleep study from 05/18/2016 showed moderate to severe obstructive sleep apnea. I would like for her to come back routinely in one year. I answered all her questions today and she was in agreement. I spent 20 minutes in total face-to-face time with the patient, more than 50% of which was spent in counseling and coordination of care, reviewing test results, reviewing medication and discussing or reviewing the diagnosis of OSA, its prognosis and treatment options. Pertinent laboratory and imaging test results that were available during this visit with the patient were reviewed by me and considered in my medical decision making (see chart for details).

## 2018-03-16 NOTE — Progress Notes (Addendum)
Subjective:   Maria [name] is a 82 y.o. female who presents for Medicare Annual (Subsequent) preventive examination.  Review of Systems: No ROS.  Medicare Wellness Visit. Additional risk factors are reflected in the social history.  Cardiac Risk Factors include: advanced age (>26men, >26 women);dyslipidemia Sleep patterns: nasal CPAP. Sleep about 8 hrs.  Home Safety/Smoke Alarms: Feels safe in home. Smoke alarms in place.  Living environment; residence and Firearm Safety: first floor condo. Lives with dog.    Female:   Mammo-  Per pt last 11/2017-normal     Dexa scan- utd            Objective:     Vitals: BP 120/72 (BP Location: Left Arm, Patient Position: Sitting, Cuff Size: Normal)   Pulse 73   Ht 5\' 6"  (1.676 m)   Wt 162 lb 6.4 oz (73.7 kg)   SpO2 96%   BMI 26.21 kg/m   Body mass index is 26.21 kg/m.  Advanced Directives 03/22/2018 06/28/2017 06/17/2017 06/16/2016 05/14/2016 12/11/2013 12/04/2013  Does Patient Have a Medical Advance Directive? Yes Yes Yes Yes Yes Patient has advance directive, copy not in chart Patient has advance directive, copy not in chart  Type of Advance Directive [place];Living will Living will;Healthcare Power of Attorney Living will;Healthcare Power of [place];Living will [place];Living will [place];Living will [place];Living will  Does patient want to make changes to medical advance directive? No - Patient declined No - Patient declined No - Patient declined - - No No  Copy of Healthcare Power of Attorney in Chart? Yes No - copy requested No - copy requested - No - copy requested Copy requested from family Copy requested from family  Pre-existing out of facility DNR order (yellow form or pink MOST form) - - - - - No No    Tobacco Social History   Tobacco Use  Smoking Status Former Smoker  . Years: 20.00  . Types: Cigarettes  . Last  attempt to quit: 05/03/1979  . Years since quitting: 38.9  Smokeless Tobacco Never Used  Tobacco Comment   smoked 1962-1980, up to < 1 ppd     Counseling given: Not Answered Comment: smoked 1962-1980, up to < 1 ppd   Clinical Intake:     Pain : No/denies pain                 Past Medical History:  Diagnosis Date  . Complication of anesthesia    slow to awaken  . Depression   . Diverticulosis   . Goiter, nodular    [place] of thyroid stable 7/08  . Headache    hx of  . Heart murmur    due to rheumatic fever as child  . Mitral valve prolapse   . Multiple thyroid nodules 11/24/2015  . Neoplasm of lung, malignant ([place]) 2002   Non-small cell surgical tx  . Osteoarthritis 11/14/2013   2010 intra-articular steroids X 3 S/P Physical Therapy in 12/14 TKR 12/11/13   . Osteopenia    Dr Pamala Hurry  . PONV (postoperative nausea and vomiting)    n/v  . Sleep apnea   . Vitamin D deficiency    Past Surgical History:  Procedure Laterality Date  . ABDOMINAL HYSTERECTOMY  1998   No BSO, for dysfunctional menses  . BLADDER SUSPENSION Bilateral   . BREAST BIOPSY Right   . COLONOSCOPY  2009 , 2014   Diverticulosis; Dr.  Brodie  . LOBECTOMY  12/02   RU; no radiation or chemo, Dr. Arlyce Dice  . TONSILLECTOMY    . TOTAL KNEE ARTHROPLASTY Right 12/11/2013   Procedure: RIGHT TOTAL KNEE ARTHROPLASTY;  Surgeon: Gearlean Alf, MD;  Location: [facility];  Service: Orthopedics;  Laterality: Right;  . TOTAL KNEE ARTHROPLASTY Left 06/28/2017   Procedure: LEFT TOTAL KNEE ARTHROPLASTY;  Surgeon: Gaynelle Arabian, MD;  Location: [facility];  Service: Orthopedics;  Laterality: Left;  Adductor Block   Family History  Problem Relation Age of Onset  . COPD Mother   . Coronary artery disease Father   . Colon cancer Father 27  . Heart attack Father 93  . Stroke Father        > 25  . Lung cancer Paternal Aunt        smoker  . Cancer Paternal Aunt        lung  . Lung cancer Maternal Grandfather        smoker    . COPD Maternal Grandfather   . Arthritis Sister   . Lung cancer Maternal Uncle        smoker  . Diabetes Neg Hx    Social History   Socioeconomic History  . Marital status: Divorced    Spouse name: Not on file  . Number of children: Not on file  . Years of education: college  . Highest education level: Not on file  Occupational History  . Occupation: Retired    Fish farm manager: RETIRED  Social Needs  . Financial resource strain: Not on file  . Food insecurity:    Worry: Not on file    Inability: Not on file  . Transportation needs:    Medical: Not on file    Non-medical: Not on file  Tobacco Use  . Smoking status: Former Smoker    Years: 20.00    Types: Cigarettes    Last attempt to quit: 05/03/1979    Years since quitting: 38.9  . Smokeless tobacco: Never Used  . Tobacco comment: smoked 1962-1980, up to < 1 ppd  Substance and Sexual Activity  . Alcohol use: Yes    Alcohol/week: 4.2 oz    Types: 7 Standard drinks or equivalent per week    Comment: occasional  . Drug use: No  . Sexual activity: Never    Comment: lives alone, dog, no dietary restrictions, eating heart healthy  Lifestyle  . Physical activity:    Days per week: Not on file    Minutes per session: Not on file  . Stress: Not on file  Relationships  . Social connections:    Talks on phone: Not on file    Gets together: Not on file    Attends religious service: Not on file    Active member of club or organization: Not on file    Attends meetings of clubs or organizations: Not on file    Relationship status: Not on file  Other Topics Concern  . Not on file  Social History Narrative   Pt gets regular exercise.   Drinks 2 cups of coffee a day     Outpatient Encounter Medications as of 03/22/2018  Medication Sig  . DULoxetine (CYMBALTA) 30 MG capsule Take 1 capsule (30 mg total) by mouth daily.  . psyllium (METAMUCIL) 58.6 % powder Take 1 packet by mouth daily. 1 teaspoon  . traMADol (ULTRAM) 50 MG  tablet Take 1-2 tablets (50-100 mg total) by mouth every 6 (six) hours as needed for  moderate pain.   No facility-administered encounter medications on file as of 03/22/2018.     Activities of Daily Living In your present state of health, do you have any difficulty performing the following activities: 03/22/2018 06/28/2017  Hearing? N N  Vision? N N  Comment wears glasses. Eye doctor appt tomorrow. Hx cataract sx last year.  -  Difficulty concentrating or making decisions? N N  Walking or climbing stairs? N Y  Dressing or bathing? N N  Doing errands, shopping? N N  Preparing Food and eating ? N -  Using the Toilet? N -  In the past six months, have you accidently leaked urine? N -  Comment hx bladder tac -  Do you have problems with loss of bowel control? N -  Managing your Medications? N -  Managing your Finances? N -  Housekeeping or managing your Housekeeping? N -  Some recent data might be hidden    Patient Care Team: Mosie Lukes, MD as PCP - General (Family Medicine) Gaynelle Arabian, MD as Consulting Physician (Orthopedic Surgery) Crista Luria, MD as Consulting Physician (Dermatology) Aloha Gell, MD as Consulting Physician (Obstetrics and Gynecology) Star Age, MD as Consulting Physician (Neurology)    Assessment:   This is a routine wellness examination for [place]. Physical assessment deferred to PCP.  Exercise Activities and Dietary recommendations Current Exercise Habits: Home exercise routine(Goal 8000 steps), Type of exercise: walking, Time (Minutes): 45, Frequency (Times/Week): 7, Weekly Exercise (Minutes/Week): 315, Intensity: Mild Diet (meal preparation, eat out, water intake, caffeinated beverages, dairy products, fruits and vegetables): in general, a "healthy" diet  , well balanced     Goals    . maintain healthy weight and healthy active lifestyle and independence.     . Weight < 158 lb (71.668 kg)     Lose 5 lbs       Fall Risk Fall Risk   03/16/2018 11/16/2016 05/14/2016 04/15/2016 02/26/2015  Falls in the past year? No No Yes No No  Number falls in past yr: - - 1 - -  Injury with Fall? - - No - -    Depression Screen PHQ 2/9 Scores 03/22/2018 11/16/2016 05/14/2016 04/15/2016  PHQ - 2 Score 0 0 1 4  PHQ- 9 Score - - - 14     Cognitive Function Ad8 score reviewed for issues:  Issues making decisions:no  Less interest in hobbies / activities:no  Repeats questions, stories (family complaining):no  Trouble using ordinary gadgets (microwave, computer, phone):no  Forgets the month or year: no  Mismanaging finances: no  Remembering appts:no  Daily problems with thinking and/or memory:no Ad8 score is=0     MMSE - Mini Mental State Exam 05/14/2016  Not completed: Refused        Immunization History  Administered Date(s) Administered  . Pneumococcal Conjugate-13 11/15/2015  . Pneumococcal Polysaccharide-23 11/21/2013  . Tdap 11/14/2013  . Zoster 07/17/2014    Screening Tests Health Maintenance  Topic Date Due  . INFLUENZA VACCINE  07/22/2018 (Originally 06/02/2018)  . COLONOSCOPY  05/12/2018  . TETANUS/TDAP  11/15/2023  . DEXA SCAN  Completed  . PNA vac Low Risk Adult  Completed      Plan:  It was such a pleasure to meet you today.  Please scheduled appointment to follow up with Dr.Blyth.  Please schedule your next medicare wellness visit with me in 1 yr.  Continue to eat heart healthy diet (full of fruits, vegetables, whole grains, lean protein, water--limit salt, fat, and sugar  intake) and increase physical activity as tolerated.  Continue doing brain stimulating activities (puzzles, reading, adult coloring books, staying active) to keep memory sharp.     I have personally reviewed and noted the following in the patient's chart:   . Medical and social history . Use of alcohol, tobacco or illicit drugs  . Current medications and supplements . Functional ability and status . Nutritional  status . Physical activity . Advanced directives . List of other physicians . Hospitalizations, surgeries, and ER visits in previous 12 months . Vitals . Screenings to include cognitive, depression, and falls . Referrals and appointments  In addition, I have reviewed and discussed with patient certain preventive protocols, quality metrics, and best practice recommendations. A written personalized care plan for preventive services as well as general preventive health recommendations were provided to patient.   Medical screening examination/treatment was performed by qualified clinical staff member and as supervising physician I was immediately available for consultation/collaboration. I have reviewed documentation and agree with assessment and plan.  Penni Homans, MD   Shela Nevin, RN  03/22/2018

## 2018-03-16 NOTE — Patient Instructions (Signed)
Please continue using your CPAP regularly. While your insurance requires that you use CPAP at least 4 hours each night on 70% of the nights, I recommend, that you not skip any nights and use it throughout the night if you can. Getting used to CPAP and staying with the treatment long term does take time and patience and discipline. Untreated obstructive sleep apnea when it is moderate to severe can have an adverse impact on cardiovascular health and raise her risk for heart disease, arrhythmias, hypertension, congestive heart failure, stroke and diabetes. Untreated obstructive sleep apnea causes sleep disruption, nonrestorative sleep, and sleep deprivation. This can have an impact on your day to day functioning and cause daytime sleepiness and impairment of cognitive function, memory loss, mood disturbance, and problems focussing. Using CPAP regularly can improve these symptoms.  Keep up the good work! I will see you back in one year!

## 2018-03-22 ENCOUNTER — Encounter: Payer: Self-pay | Admitting: *Deleted

## 2018-03-22 ENCOUNTER — Ambulatory Visit (INDEPENDENT_AMBULATORY_CARE_PROVIDER_SITE_OTHER): Payer: Medicare Other | Admitting: *Deleted

## 2018-03-22 VITALS — BP 120/72 | HR 73 | Ht 66.0 in | Wt 162.4 lb

## 2018-03-22 DIAGNOSIS — Z0000 Encounter for general adult medical examination without abnormal findings: Secondary | ICD-10-CM | POA: Diagnosis not present

## 2018-03-22 NOTE — Patient Instructions (Signed)
It was such a pleasure to meet you today.  Please scheduled appointment to follow up with Dr.Blyth.  Please schedule your next medicare wellness visit with me in 1 yr.  Continue to eat heart healthy diet (full of fruits, vegetables, whole grains, lean protein, water--limit salt, fat, and sugar intake) and increase physical activity as tolerated.  Continue doing brain stimulating activities (puzzles, reading, adult coloring books, staying active) to keep memory sharp.    [name] , Thank you for taking time to come for your Medicare Wellness Visit. I appreciate your ongoing commitment to your health goals. Please review the following plan we discussed and let me know if I can assist you in the future.   These are the goals we discussed: Goals    . maintain healthy weight and healthy active lifestyle and independence.     . Weight < 158 lb (71.668 kg)     Lose 5 lbs       This is a list of the screening recommended for you and due dates:  Health Maintenance  Topic Date Due  . Flu Shot  07/22/2018*  . Colon Cancer Screening  05/12/2018  . Tetanus Vaccine  11/15/2023  . DEXA scan (bone density measurement)  Completed  . Pneumonia vaccines  Completed  *Topic was postponed. The date shown is not the original due date.    Health Maintenance for Postmenopausal Women Menopause is a normal process in which your reproductive ability comes to an end. This process happens gradually over a span of months to years, usually between the ages of 54 and 66. Menopause is complete when you have missed 12 consecutive menstrual periods. It is important to talk with your health care provider about some of the most common conditions that affect postmenopausal women, such as heart disease, cancer, and bone loss (osteoporosis). Adopting a healthy lifestyle and getting preventive care can help to promote your health and wellness. Those actions can also lower your chances of developing some of these common  conditions. What should I know about menopause? During menopause, you may experience a number of symptoms, such as:  Moderate-to-severe hot flashes.  Night sweats.  Decrease in sex drive.  Mood swings.  Headaches.  Tiredness.  Irritability.  Memory problems.  Insomnia.  Choosing to treat or not to treat menopausal changes is an individual decision that you make with your health care provider. What should I know about hormone replacement therapy and supplements? Hormone therapy products are effective for treating symptoms that are associated with menopause, such as hot flashes and night sweats. Hormone replacement carries certain risks, especially as you become older. If you are thinking about using estrogen or estrogen with progestin treatments, discuss the benefits and risks with your health care provider. What should I know about heart disease and stroke? Heart disease, heart attack, and stroke become more likely as you age. This may be due, in part, to the hormonal changes that your body experiences during menopause. These can affect how your body processes dietary fats, triglycerides, and cholesterol. Heart attack and stroke are both medical emergencies. There are many things that you can do to help prevent heart disease and stroke:  Have your blood pressure checked at least every 1-2 years. High blood pressure causes heart disease and increases the risk of stroke.  If you are 80-33 years old, ask your health care provider if you should take aspirin to prevent a heart attack or a stroke.  Do not use any tobacco  products, including cigarettes, chewing tobacco, or electronic cigarettes. If you need help quitting, ask your health care provider.  It is important to eat a healthy diet and maintain a healthy weight. ? Be sure to include plenty of vegetables, fruits, low-fat dairy products, and lean protein. ? Avoid eating foods that are high in solid fats, added sugars, or salt  (sodium).  Get regular exercise. This is one of the most important things that you can do for your health. ? Try to exercise for at least 150 minutes each week. The type of exercise that you do should increase your heart rate and make you sweat. This is known as moderate-intensity exercise. ? Try to do strengthening exercises at least twice each week. Do these in addition to the moderate-intensity exercise.  Know your numbers.Ask your health care provider to check your cholesterol and your blood glucose. Continue to have your blood tested as directed by your health care provider.  What should I know about cancer screening? There are several types of cancer. Take the following steps to reduce your risk and to catch any cancer development as early as possible. Breast Cancer  Practice breast self-awareness. ? This means understanding how your breasts normally appear and feel. ? It also means doing regular breast self-exams. Let your health care provider know about any changes, no matter how small.  If you are 80 or older, have a clinician do a breast exam (clinical breast exam or CBE) every year. Depending on your age, family history, and medical history, it may be recommended that you also have a yearly breast X-ray (mammogram).  If you have a family history of breast cancer, talk with your health care provider about genetic screening.  If you are at high risk for breast cancer, talk with your health care provider about having an MRI and a mammogram every year.  Breast cancer (BRCA) gene test is recommended for women who have family members with BRCA-related cancers. Results of the assessment will determine the need for genetic counseling and BRCA1 and for BRCA2 testing. BRCA-related cancers include these types: ? Breast. This occurs in males or females. ? Ovarian. ? Tubal. This may also be called fallopian tube cancer. ? Cancer of the abdominal or pelvic lining (peritoneal  cancer). ? Prostate. ? Pancreatic.  Cervical, Uterine, and Ovarian Cancer Your health care provider may recommend that you be screened regularly for cancer of the pelvic organs. These include your ovaries, uterus, and vagina. This screening involves a pelvic exam, which includes checking for microscopic changes to the surface of your cervix (Pap test).  For women ages 21-65, health care providers may recommend a pelvic exam and a Pap test every three years. For women ages 51-65, they may recommend the Pap test and pelvic exam, combined with testing for human papilloma virus (HPV), every five years. Some types of HPV increase your risk of cervical cancer. Testing for HPV may also be done on women of any age who have unclear Pap test results.  Other health care providers may not recommend any screening for nonpregnant women who are considered low risk for pelvic cancer and have no symptoms. Ask your health care provider if a screening pelvic exam is right for you.  If you have had past treatment for cervical cancer or a condition that could lead to cancer, you need Pap tests and screening for cancer for at least 20 years after your treatment. If Pap tests have been discontinued for you, your risk factors (  such as having a new sexual partner) need to be reassessed to determine if you should start having screenings again. Some women have medical problems that increase the chance of getting cervical cancer. In these cases, your health care provider may recommend that you have screening and Pap tests more often.  If you have a family history of uterine cancer or ovarian cancer, talk with your health care provider about genetic screening.  If you have vaginal bleeding after reaching menopause, tell your health care provider.  There are currently no reliable tests available to screen for ovarian cancer.  Lung Cancer Lung cancer screening is recommended for adults 85-79 years old who are at high risk for  lung cancer because of a history of smoking. A yearly low-dose CT scan of the lungs is recommended if you:  Currently smoke.  Have a history of at least 30 pack-years of smoking and you currently smoke or have quit within the past 15 years. A pack-year is smoking an average of one pack of cigarettes per day for one year.  Yearly screening should:  Continue until it has been 15 years since you quit.  Stop if you develop a health problem that would prevent you from having lung cancer treatment.  Colorectal Cancer  This type of cancer can be detected and can often be prevented.  Routine colorectal cancer screening usually begins at age 44 and continues through age 49.  If you have risk factors for colon cancer, your health care provider may recommend that you be screened at an earlier age.  If you have a family history of colorectal cancer, talk with your health care provider about genetic screening.  Your health care provider may also recommend using home test kits to check for hidden blood in your stool.  A small camera at the end of a tube can be used to examine your colon directly (sigmoidoscopy or colonoscopy). This is done to check for the earliest forms of colorectal cancer.  Direct examination of the colon should be repeated every 5-10 years until age 58. However, if early forms of precancerous polyps or small growths are found or if you have a family history or genetic risk for colorectal cancer, you may need to be screened more often.  Skin Cancer  Check your skin from head to toe regularly.  Monitor any moles. Be sure to tell your health care provider: ? About any new moles or changes in moles, especially if there is a change in a mole's shape or color. ? If you have a mole that is larger than the size of a pencil eraser.  If any of your family members has a history of skin cancer, especially at a young age, talk with your health care provider about genetic  screening.  Always use sunscreen. Apply sunscreen liberally and repeatedly throughout the day.  Whenever you are outside, protect yourself by wearing long sleeves, pants, a wide-brimmed hat, and sunglasses.  What should I know about osteoporosis? Osteoporosis is a condition in which bone destruction happens more quickly than new bone creation. After menopause, you may be at an increased risk for osteoporosis. To help prevent osteoporosis or the bone fractures that can happen because of osteoporosis, the following is recommended:  If you are 55-85 years old, get at least 1,000 mg of calcium and at least 600 mg of vitamin D per day.  If you are older than age 69 but younger than age 65, get at least 1,200 mg of  calcium and at least 600 mg of vitamin D per day.  If you are older than age 34, get at least 1,200 mg of calcium and at least 800 mg of vitamin D per day.  Smoking and excessive alcohol intake increase the risk of osteoporosis. Eat foods that are rich in calcium and vitamin D, and do weight-bearing exercises several times each week as directed by your health care provider. What should I know about how menopause affects my mental health? Depression may occur at any age, but it is more common as you become older. Common symptoms of depression include:  Low or sad mood.  Changes in sleep patterns.  Changes in appetite or eating patterns.  Feeling an overall lack of motivation or enjoyment of activities that you previously enjoyed.  Frequent crying spells.  Talk with your health care provider if you think that you are experiencing depression. What should I know about immunizations? It is important that you get and maintain your immunizations. These include:  Tetanus, diphtheria, and pertussis (Tdap) booster vaccine.  Influenza every year before the flu season begins.  Pneumonia vaccine.  Shingles vaccine.  Your health care provider may also recommend other  immunizations. This information is not intended to replace advice given to you by your health care provider. Make sure you discuss any questions you have with your health care provider. Document Released: 12/11/2005 Document Revised: 05/08/2016 Document Reviewed: 07/23/2015 Elsevier Interactive Patient Education  2018 [employer].

## 2018-05-16 ENCOUNTER — Other Ambulatory Visit: Payer: Self-pay | Admitting: Family Medicine

## 2018-07-08 ENCOUNTER — Ambulatory Visit (INDEPENDENT_AMBULATORY_CARE_PROVIDER_SITE_OTHER): Payer: Medicare Other | Admitting: Family Medicine

## 2018-07-08 ENCOUNTER — Encounter: Payer: Self-pay | Admitting: Family Medicine

## 2018-07-08 DIAGNOSIS — E782 Mixed hyperlipidemia: Secondary | ICD-10-CM

## 2018-07-08 DIAGNOSIS — Z0000 Encounter for general adult medical examination without abnormal findings: Secondary | ICD-10-CM

## 2018-07-08 DIAGNOSIS — E559 Vitamin D deficiency, unspecified: Secondary | ICD-10-CM

## 2018-07-08 DIAGNOSIS — M810 Age-related osteoporosis without current pathological fracture: Secondary | ICD-10-CM | POA: Diagnosis not present

## 2018-07-08 LAB — COMPREHENSIVE METABOLIC PANEL
ALT: 16 U/L (ref 0–35)
AST: 22 U/L (ref 0–37)
Albumin: 4.4 g/dL (ref 3.5–5.2)
Alkaline Phosphatase: 98 U/L (ref 39–117)
BUN: 17 mg/dL (ref 6–23)
CO2: 34 mEq/L — ABNORMAL HIGH (ref 19–32)
Calcium: 9.8 mg/dL (ref 8.4–10.5)
Chloride: 99 mEq/L (ref 96–112)
Creatinine, Ser: 0.7 mg/dL (ref 0.40–1.20)
GFR: 85.45 mL/min (ref >60.00)
Glucose, Bld: 88 mg/dL (ref 70–99)
Potassium: 4.3 mEq/L (ref 3.5–5.1)
Sodium: 140 mEq/L (ref 135–145)
Total Bilirubin: 0.7 mg/dL (ref 0.2–1.2)
Total Protein: 6.9 g/dL (ref 6.0–8.3)

## 2018-07-08 LAB — CBC
HCT: 44.9 % (ref 36.0–46.0)
Hemoglobin: 15.3 g/dL — ABNORMAL HIGH (ref 12.0–15.0)
MCHC: 34 g/dL (ref 30.0–36.0)
MCV: 91.9 fl (ref 78.0–100.0)
Platelets: 274 10*3/uL (ref 150.0–400.0)
RBC: 4.89 Mil/uL (ref 3.87–5.11)
RDW: 13 % (ref 11.5–15.5)
WBC: 6.7 10*3/uL (ref 4.0–10.5)

## 2018-07-08 LAB — LIPID PANEL
Cholesterol: 197 mg/dL (ref 0–200)
HDL: 66.2 mg/dL (ref >39.00)
LDL Cholesterol: 96 mg/dL (ref 0–99)
NonHDL: 130.82
Total CHOL/HDL Ratio: 3
Triglycerides: 174 mg/dL — ABNORMAL HIGH (ref 0.0–149.0)
VLDL: 34.8 mg/dL (ref 0.0–40.0)

## 2018-07-08 LAB — TSH: TSH: 2.23 u[IU]/mL (ref 0.35–4.50)

## 2018-07-08 LAB — VITAMIN D 25 HYDROXY (VIT D DEFICIENCY, FRACTURES): VITD: 42.47 ng/mL (ref 30.00–100.00)

## 2018-07-08 MED ORDER — DULOXETINE HCL 30 MG PO CPEP: ORAL_CAPSULE | ORAL | 1 refills | Status: DC

## 2018-07-08 NOTE — Patient Instructions (Addendum)
NOW probiotic, or a multistrain daily  Miralax and Benefiber once or twice a day.  Shingrix is the new shingles shot 2 shots over 2-6 months at [place] 65 Years and Older, Female Preventive care refers to lifestyle choices and visits with your health care provider that can promote health and wellness. What does preventive care include?  A yearly physical exam. This is also called an annual well check.  Dental exams once or twice a year.  Routine eye exams. Ask your health care provider how often you should have your eyes checked.  Personal lifestyle choices, including: ? Daily care of your teeth and gums. ? Regular physical activity. ? Eating a healthy diet. ? Avoiding tobacco and drug use. ? Limiting alcohol use. ? Practicing safe sex. ? Taking low-dose aspirin every day. ? Taking vitamin and mineral supplements as recommended by your health care provider. What happens during an annual well check? The services and screenings done by your health care provider during your annual well check will depend on your age, overall health, lifestyle risk factors, and family history of disease. Counseling Your health care provider may ask you questions about your:  Alcohol use.  Tobacco use.  Drug use.  Emotional well-being.  Home and relationship well-being.  Sexual activity.  Eating habits.  History of falls.  Memory and ability to understand (cognition).  Work and work Statistician.  Reproductive health.  Screening You may have the following tests or measurements:  Height, weight, and BMI.  Blood pressure.  Lipid and cholesterol levels. These may be checked every 5 years, or more frequently if you are over 73 years old.  Skin check.  Lung cancer screening. You may have this screening every year starting at age 9 if you have a 30-pack-year history of smoking and currently smoke or have quit within the past 15 years.  Fecal occult blood test  (FOBT) of the stool. You may have this test every year starting at age 23.  Flexible sigmoidoscopy or colonoscopy. You may have a sigmoidoscopy every 5 years or a colonoscopy every 10 years starting at age 28.  Hepatitis C blood test.  Hepatitis B blood test.  Sexually transmitted disease (STD) testing.  Diabetes screening. This is done by checking your blood sugar (glucose) after you have not eaten for a while (fasting). You may have this done every 1-3 years.  Bone density scan. This is done to screen for osteoporosis. You may have this done starting at age 33.  Mammogram. This may be done every 1-2 years. Talk to your health care provider about how often you should have regular mammograms.  Talk with your health care provider about your test results, treatment options, and if necessary, the need for more tests. Vaccines Your health care provider may recommend certain vaccines, such as:  Influenza vaccine. This is recommended every year.  Tetanus, diphtheria, and acellular pertussis (Tdap, Td) vaccine. You may need a Td booster every 10 years.  Varicella vaccine. You may need this if you have not been vaccinated.  Zoster vaccine. You may need this after age 18.  Measles, mumps, and rubella (MMR) vaccine. You may need at least one dose of MMR if you were born in 1957 or later. You may also need a second dose.  Pneumococcal 13-valent conjugate (PCV13) vaccine. One dose is recommended after age 64.  Pneumococcal polysaccharide (PPSV23) vaccine. One dose is recommended after age 8.  Meningococcal vaccine. You may need this if you have  certain conditions.  Hepatitis A vaccine. You may need this if you have certain conditions or if you travel or work in places where you may be exposed to hepatitis A.  Hepatitis B vaccine. You may need this if you have certain conditions or if you travel or work in places where you may be exposed to hepatitis B.  Haemophilus influenzae type b  (Hib) vaccine. You may need this if you have certain conditions.  Talk to your health care provider about which screenings and vaccines you need and how often you need them. This information is not intended to replace advice given to you by your health care provider. Make sure you discuss any questions you have with your health care provider. Document Released: 11/15/2015 Document Revised: 07/08/2016 Document Reviewed: 08/20/2015 Elsevier Interactive Patient Education  [employer].

## 2018-07-08 NOTE — Assessment & Plan Note (Signed)
Patient encouraged to maintain heart healthy diet, regular exercise, adequate sleep. Consider daily probiotics. Take medications as prescribed 

## 2018-07-08 NOTE — Progress Notes (Signed)
Subjective:    Patient ID: Maria [name], female    DOB: 03-Jun-1936, 82 y.o.   MRN: [id number]  No chief complaint on file.   HPI Patient is in today for annual preventative exam and follow up on chronic medical concerns including vitamin D deficiency, hyperlipidemia and more. No recent febrile illness or hospitalizations. She stays active and exercises regularly and eats a heart healthy diet. She is doing well with activities of daily living. Denies CP/palp/SOB/HA/congestion/fevers/GI or GU c/o. Taking meds as prescribed  Past Medical History:  Diagnosis Date  . Complication of anesthesia    slow to awaken  . Depression   . Diverticulosis   . Goiter, nodular    [place] of thyroid stable 7/08  . Headache    hx of  . Heart murmur    due to rheumatic fever as child  . Mitral valve prolapse   . Multiple thyroid nodules 11/24/2015  . Neoplasm of lung, malignant ([place]) 2002   Non-small cell surgical tx  . Osteoarthritis 11/14/2013   2010 intra-articular steroids X 3 S/P Physical Therapy in 12/14 TKR 12/11/13   . Osteopenia    Dr Pamala Hurry  . PONV (postoperative nausea and vomiting)    n/v  . Sleep apnea   . Vitamin D deficiency     Past Surgical History:  Procedure Laterality Date  . ABDOMINAL HYSTERECTOMY  1998   No BSO, for dysfunctional menses  . BLADDER SUSPENSION Bilateral   . BREAST BIOPSY Right   . COLONOSCOPY  2009 , 2014   Diverticulosis; Dr. Olevia Perches  . LOBECTOMY  12/02   RU; no radiation or chemo, Dr. Arlyce Dice  . TONSILLECTOMY    . TOTAL KNEE ARTHROPLASTY Right 12/11/2013   Procedure: RIGHT TOTAL KNEE ARTHROPLASTY;  Surgeon: Gearlean Alf, MD;  Location: [facility];  Service: Orthopedics;  Laterality: Right;  . TOTAL KNEE ARTHROPLASTY Left 06/28/2017   Procedure: LEFT TOTAL KNEE ARTHROPLASTY;  Surgeon: Gaynelle Arabian, MD;  Location: [facility];  Service: Orthopedics;  Laterality: Left;  Adductor Block    Family History  Problem Relation Age of Onset  . COPD Mother   .  Coronary artery disease Father   . Colon cancer Father 59  . Heart attack Father 55  . Stroke Father        > 5  . Lung cancer Paternal Aunt        smoker  . Cancer Paternal Aunt        lung  . Lung cancer Maternal Grandfather        smoker  . COPD Maternal Grandfather   . Arthritis Sister   . Lung cancer Maternal Uncle        smoker  . Diabetes Neg Hx     Social History   Socioeconomic History  . Marital status: Divorced    Spouse name: Not on file  . Number of children: Not on file  . Years of education: college  . Highest education level: Not on file  Occupational History  . Occupation: Retired    Fish farm manager: RETIRED  Social Needs  . Financial resource strain: Not on file  . Food insecurity:    Worry: Not on file    Inability: Not on file  . Transportation needs:    Medical: Not on file    Non-medical: Not on file  Tobacco Use  . Smoking status: Former Smoker    Years: 20.00    Types: Cigarettes    Last attempt to  quit: 05/03/1979    Years since quitting: 39.2  . Smokeless tobacco: Never Used  . Tobacco comment: smoked 1962-1980, up to < 1 ppd  Substance and Sexual Activity  . Alcohol use: Yes    Alcohol/week: 7.0 standard drinks    Types: 7 Standard drinks or equivalent per week    Comment: occasional  . Drug use: No  . Sexual activity: Never    Comment: lives alone, dog, no dietary restrictions, eating heart healthy  Lifestyle  . Physical activity:    Days per week: Not on file    Minutes per session: Not on file  . Stress: Not on file  Relationships  . Social connections:    Talks on phone: Not on file    Gets together: Not on file    Attends religious service: Not on file    Active member of club or organization: Not on file    Attends meetings of clubs or organizations: Not on file    Relationship status: Not on file  . Intimate partner violence:    Fear of current or ex partner: Not on file    Emotionally abused: Not on file    Physically  abused: Not on file    Forced sexual activity: Not on file  Other Topics Concern  . Not on file  Social History Narrative   Pt gets regular exercise.   Drinks 2 cups of coffee a day     Outpatient Medications Prior to Visit  Medication Sig Dispense Refill  . calcium acetate (PHOSLO) 667 MG capsule Take by mouth 3 (three) times daily with meals.    . cholecalciferol (VITAMIN D) 1000 units tablet Take 1,000 Units by mouth daily.    . folic acid (FOLVITE) 1 MG tablet Take 1 mg by mouth daily.    . psyllium (METAMUCIL) 58.6 % powder Take 1 packet by mouth daily. 1 teaspoon    . vitamin B-12 (CYANOCOBALAMIN) 500 MCG tablet Take 500 mcg by mouth daily.    . DULoxetine (CYMBALTA) 30 MG capsule TAKE 1 CAPSULE BY MOUTH EVERY DAY 30 capsule 3  . traMADol (ULTRAM) 50 MG tablet Take 1-2 tablets (50-100 mg total) by mouth every 6 (six) hours as needed for moderate pain. 56 tablet 0   No facility-administered medications prior to visit.     Allergies  Allergen Reactions  . Morphine And Related     Mental status change  . Oxycodone     Mental status changes post op 12/11/13  . Sulfonamide Derivatives     REACTION: stomach pain    Review of Systems  Constitutional: Negative for fever and malaise/fatigue.  HENT: Negative for congestion.   Eyes: Negative for blurred vision.  Respiratory: Negative for shortness of breath.   Cardiovascular: Negative for chest pain, palpitations and leg swelling.  Gastrointestinal: Negative for abdominal pain, blood in stool and nausea.  Genitourinary: Negative for dysuria and frequency.  Musculoskeletal: Negative for falls.  Skin: Negative for rash.  Neurological: Negative for dizziness, loss of consciousness and headaches.  Endo/Heme/Allergies: Negative for environmental allergies.  Psychiatric/Behavioral: Negative for depression. The patient is not nervous/anxious.        Objective:    Physical Exam  Constitutional: She is oriented to person, place,  and time. She appears well-developed and well-nourished. No distress.  HENT:  Head: Normocephalic and atraumatic.  Eyes: Conjunctivae are normal.  Neck: Neck supple. No thyromegaly present.  Cardiovascular: Normal rate, regular rhythm and normal heart sounds.  No  murmur heard. Pulmonary/Chest: Effort normal and breath sounds normal. No respiratory distress.  Abdominal: Soft. Bowel sounds are normal. She exhibits no distension and no mass. There is no tenderness.  Musculoskeletal: She exhibits no edema or deformity.  Lymphadenopathy:    She has no cervical adenopathy.  Neurological: She is alert and oriented to person, place, and time. No cranial nerve deficit.  Skin: Skin is warm and dry.  Psychiatric: She has a normal mood and affect. Her behavior is normal.    BP 128/80 (BP Location: Left Arm, Patient Position: Sitting, Cuff Size: Normal)   Pulse 87   Temp 97.8 F (36.6 C) (Oral)   Resp 18   Ht 5\' 6"  (1.676 m)   Wt 165 lb 12.8 oz (75.2 kg)   SpO2 96%   BMI 26.76 kg/m  Wt Readings from Last 3 Encounters:  07/08/18 165 lb 12.8 oz (75.2 kg)  03/22/18 162 lb 6.4 oz (73.7 kg)  03/16/18 164 lb (74.4 kg)     Lab Results  Component Value Date   WBC 6.7 07/08/2018   HGB 15.3 (H) 07/08/2018   HCT 44.9 07/08/2018   PLT 274.0 07/08/2018   GLUCOSE 88 07/08/2018   CHOL 197 07/08/2018   TRIG 174.0 (H) 07/08/2018   HDL 66.20 07/08/2018   LDLDIRECT 137.2 10/23/2008   LDLCALC 96 07/08/2018   ALT 16 07/08/2018   AST 22 07/08/2018   NA 140 07/08/2018   K 4.3 07/08/2018   CL 99 07/08/2018   CREATININE 0.70 07/08/2018   BUN 17 07/08/2018   CO2 34 (H) 07/08/2018   TSH 2.23 07/08/2018   INR 0.95 06/17/2017   HGBA1C 5.6 02/13/2014    Lab Results  Component Value Date   TSH 2.23 07/08/2018   Lab Results  Component Value Date   WBC 6.7 07/08/2018   HGB 15.3 (H) 07/08/2018   HCT 44.9 07/08/2018   MCV 91.9 07/08/2018   PLT 274.0 07/08/2018   Lab Results  Component Value  Date   NA 140 07/08/2018   K 4.3 07/08/2018   CO2 34 (H) 07/08/2018   GLUCOSE 88 07/08/2018   BUN 17 07/08/2018   CREATININE 0.70 07/08/2018   BILITOT 0.7 07/08/2018   ALKPHOS 98 07/08/2018   AST 22 07/08/2018   ALT 16 07/08/2018   PROT 6.9 07/08/2018   ALBUMIN 4.4 07/08/2018   CALCIUM 9.8 07/08/2018   ANIONGAP 8 06/30/2017   GFR 85.45 07/08/2018   Lab Results  Component Value Date   CHOL 197 07/08/2018   Lab Results  Component Value Date   HDL 66.20 07/08/2018   Lab Results  Component Value Date   LDLCALC 96 07/08/2018   Lab Results  Component Value Date   TRIG 174.0 (H) 07/08/2018   Lab Results  Component Value Date   CHOLHDL 3 07/08/2018   Lab Results  Component Value Date   HGBA1C 5.6 02/13/2014       Assessment & Plan:   Problem List Items Addressed This Visit    Vitamin D deficiency    Supplement and monitor      Relevant Orders   VITAMIN D 25 Hydroxy (Vit-D Deficiency, Fractures) (Completed)   Hyperlipidemia, mixed    Encouraged heart healthy diet, increase exercise, avoid trans fats, consider a krill oil cap daily      Relevant Orders   Lipid panel (Completed)   Osteoporosis    Encouraged to get adequate exercise, calcium and vitamin d intake  Relevant Medications   cholecalciferol (VITAMIN D) 1000 units tablet   Preventative health care    Patient encouraged to maintain heart healthy diet, regular exercise, adequate sleep. Consider daily probiotics. Take medications as prescribed      Relevant Orders   CBC (Completed)   Comprehensive metabolic panel (Completed)   TSH (Completed)      I have discontinued Hoyle Sauer B. Verstraete's traMADol. I am also having her maintain her psyllium, cholecalciferol, folic acid, calcium acetate, vitamin B-12, and DULoxetine.  Meds ordered this encounter  Medications  . DULoxetine (CYMBALTA) 30 MG capsule    Sig: TAKE 1 CAPSULE BY MOUTH EVERY DAY    Dispense:  90 capsule    Refill:  1       Penni Homans, MD

## 2018-07-08 NOTE — Assessment & Plan Note (Signed)
Encouraged to get adequate exercise, calcium and vitamin d intake 

## 2018-07-08 NOTE — Assessment & Plan Note (Signed)
Encouraged heart healthy diet, increase exercise, avoid trans fats, consider a krill oil cap daily 

## 2018-07-08 NOTE — Assessment & Plan Note (Signed)
Supplement and monitor 

## 2018-11-04 DIAGNOSIS — Z96652 Presence of left artificial knee joint: Secondary | ICD-10-CM | POA: Insufficient documentation

## 2018-12-12 ENCOUNTER — Encounter: Payer: Self-pay | Admitting: Family Medicine

## 2018-12-12 LAB — HM MAMMOGRAPHY

## 2018-12-15 ENCOUNTER — Telehealth: Payer: Self-pay | Admitting: *Deleted

## 2018-12-15 NOTE — Telephone Encounter (Signed)
Received Mammogram results from [place]; forwarded to provider/SLS 02/13

## 2018-12-23 ENCOUNTER — Encounter: Payer: Self-pay | Admitting: Family Medicine

## 2018-12-28 ENCOUNTER — Ambulatory Visit: Payer: Medicare Other | Admitting: Podiatry

## 2018-12-28 ENCOUNTER — Ambulatory Visit (INDEPENDENT_AMBULATORY_CARE_PROVIDER_SITE_OTHER): Payer: Medicare Other

## 2018-12-28 ENCOUNTER — Encounter: Payer: Self-pay | Admitting: Podiatry

## 2018-12-28 VITALS — BP 135/90 | HR 84 | Resp 16

## 2018-12-28 DIAGNOSIS — M2041 Other hammer toe(s) (acquired), right foot: Secondary | ICD-10-CM

## 2018-12-28 DIAGNOSIS — M779 Enthesopathy, unspecified: Secondary | ICD-10-CM | POA: Diagnosis not present

## 2018-12-28 DIAGNOSIS — M2042 Other hammer toe(s) (acquired), left foot: Secondary | ICD-10-CM

## 2018-12-28 DIAGNOSIS — L84 Corns and callosities: Secondary | ICD-10-CM

## 2018-12-28 MED ORDER — TRIAMCINOLONE ACETONIDE 10 MG/ML IJ SUSP
10.0000 mg | Freq: Once | INTRAMUSCULAR | Status: AC
  Administered 2018-12-28: 10 mg

## 2018-12-28 NOTE — Patient Instructions (Signed)
Hammer Toe  Hammer toe is a change in the shape (a deformity) of your toe. The deformity causes the middle joint of your toe to stay bent. This causes pain, especially when you are wearing shoes. Hammer toe starts gradually. At first, the toe can be straightened. Gradually over time, the deformity becomes stiff and permanent. Early treatments to keep the toe straight may relieve pain. As the deformity becomes stiff and permanent, surgery may be needed to straighten the toe. What are the causes? Hammer toe is caused by abnormal bending of the toe joint that is closest to your foot. It happens gradually over time. This pulls on the muscles and connections (tendons) of the toe joint, making them weak and stiff. It is often related to wearing shoes that are too short or narrow and do not let your toes straighten. What increases the risk? You may be at greater risk for hammer toe if you:  Are female.  Are older.  Wear shoes that are too small.  Wear high-heeled shoes that pinch your toes.  Are a ballet dancer.  Have a second toe that is longer than your big toe (first toe).  Injure your foot or toe.  Have arthritis.  Have a family history of hammer toe.  Have a nerve or muscle disorder. What are the signs or symptoms? The main symptoms of this condition are pain and deformity of the toe. The pain is worse when wearing shoes, walking, or running. Other symptoms may include:  Corns or calluses over the bent part of the toe or between the toes.  Redness and a burning feeling on the toe.  An open sore that forms on the top of the toe.  Not being able to straighten the toe. How is this diagnosed? This condition is diagnosed based on your symptoms and a physical exam. During the exam, your health care provider will try to straighten your toe to see how stiff the deformity is. You may also have tests, such as:  A blood test to check for rheumatoid arthritis.  An X-ray to show how  severe the deformity is. How is this treated? Treatment for this condition will depend on how stiff the deformity is. Surgery is often needed. However, sometimes a hammer toe can be straightened without surgery. Treatments that do not involve surgery include:  Taping the toe into a straightened position.  Using pads and cushions to protect the toe (orthotics).  Wearing shoes that provide enough room for the toes.  Doing toe-stretching exercises at home.  Taking an NSAID to reduce pain and swelling. If these treatments do not help or the toe cannot be straightened, surgery is the next option. The most common surgeries used to straighten a hammer toe include:  Arthroplasty. In this procedure, part of the joint is removed, and that allows the toe to straighten.  Fusion. In this procedure, cartilage between the two bones of the joint is taken out and the bones are fused together into one longer bone.  Implantation. In this procedure, part of the bone is removed and replaced with an implant to let the toe move again.  Flexor tendon transfer. In this procedure, the tendons that curl the toes down (flexor tendons) are repositioned. Follow these instructions at home:  Take over-the-counter and prescription medicines only as told by your health care provider.  Do toe straightening and stretching exercises as told by your health care provider.  Keep all follow-up visits as told by your health care   provider. This is important. How is this prevented?  Wear shoes that give your toes enough room and do not cause pain.  Do not wear high-heeled shoes. Contact a health care provider if:  Your pain gets worse.  Your toe becomes red or swollen.  You develop an open sore on your toe. This information is not intended to replace advice given to you by your health care provider. Make sure you discuss any questions you have with your health care provider. Document Released: 10/16/2000 Document  Revised: 05/17/2017 Document Reviewed: 02/12/2016 Elsevier Interactive Patient Education  2019 [employer]  

## 2018-12-28 NOTE — Progress Notes (Signed)
   Subjective:    Patient ID: Maria [name], female    DOB: [birth year], 83 y.o.   MRN: [id number]  HPI    Review of Systems  All other systems reviewed and are negative.      Objective:   Physical Exam        Assessment & Plan:

## 2018-12-31 NOTE — Progress Notes (Signed)
Subjective:   Patient ID: Maria [name], female   DOB: 83 y.o.   MRN: [id number]   HPI Patient presents stating she has had hammertoes for a long time and is developed increased pain with her left second digit and states it is been bothering her a lot with shoe gear and ambulation.  Patient states this is been ongoing and continuing to be painful for her and it is been getting worse over the last few months.  Patient does not smoke and likes to be active   Review of Systems  All other systems reviewed and are negative.       Objective:  Physical Exam Vitals signs and nursing note reviewed.  Constitutional:      Appearance: She is well-developed.  Pulmonary:     Effort: Pulmonary effort is normal.  Musculoskeletal: Normal range of motion.  Skin:    General: Skin is warm.  Neurological:     Mental Status: She is alert.     Neurovascular status was found to be intact with muscle strength adequate range of motion within normal limits.  Patient is noted to have inflammation of pain of the second digit left foot with fluid buildup around the inner phalangeal joint with keratotic lesion formation that is painful with palpation.  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  I did explain that ultimately digital fusion may be necessary but will get a try first conservative and I did a proximal nerve block of the left second digit injected the inner phalangeal joint with 1 mg of dexamethasone 1 mg Kenalog debrided the lesion applied padding to take pressure off the toe and advised on soft type shoe gear.  Reappoint for [place] to recheck  X-ray indicates significant rigid contracture digit to left and right  foot with moderate bunion formation

## 2019-01-02 ENCOUNTER — Ambulatory Visit (INDEPENDENT_AMBULATORY_CARE_PROVIDER_SITE_OTHER): Payer: Medicare Other

## 2019-01-02 ENCOUNTER — Ambulatory Visit: Payer: Medicare Other | Admitting: Sports Medicine

## 2019-01-02 ENCOUNTER — Encounter: Payer: Self-pay | Admitting: Sports Medicine

## 2019-01-02 VITALS — BP 138/88 | HR 79 | Ht 66.0 in | Wt 171.6 lb

## 2019-01-02 DIAGNOSIS — R29898 Other symptoms and signs involving the musculoskeletal system: Secondary | ICD-10-CM

## 2019-01-02 DIAGNOSIS — Z96653 Presence of artificial knee joint, bilateral: Secondary | ICD-10-CM | POA: Diagnosis not present

## 2019-01-02 DIAGNOSIS — M545 Low back pain: Secondary | ICD-10-CM

## 2019-01-02 DIAGNOSIS — G8929 Other chronic pain: Secondary | ICD-10-CM

## 2019-01-02 DIAGNOSIS — R269 Unspecified abnormalities of gait and mobility: Secondary | ICD-10-CM

## 2019-01-02 NOTE — Progress Notes (Signed)
Maria [name]. [place], [place] [facility]  Maria [name] - 83 y.o. female MRN [id number]  Date of birth: Jul 01, 1936  Visit Date: January 02, 2019  PCP: Mosie Lukes, MD   Referred by: Mosie Lukes, MD  SUBJECTIVE:  Chief Complaint  Patient presents with  . New Patient (Initial Visit)    Back pain.  Hx of L knee TKA.    HPI: Patient is here for persistent difficulty with walking.  She has had issues for quite some time but is actually gotten worse since undergoing bilateral knee replacements.  She has worked with physical therapy in the past especially for her knees and has had some improvements with her functional mobility however has had worsening changes in her gait.  She has occasional low back aching.  This is overall mild.  She does have a history of lung cancer in 2002.  She has had no x-rays of her back recently.  Back pain does seem to be worsened with standing and walking.  It is alleviated with sitting and resting.  She has no significant limitations in the distance that she is able to ambulate but does walk with a marked hunched forward antalgic gait as outlined per exam.  REVIEW OF SYSTEMS: No significant nighttime awakenings due to this issue. Denies fevers, chills, recent weight gain or weight loss.  No night sweats.  Pt denies any change in bowel or bladder habits, muscle weakness, numbness or falls associated with this pain. Prior history of lung cancer in 2002. Otherwise 12 point review of systems is negative.  HISTORY:  Prior history reviewed and updated per electronic medical record.  Patient Active Problem List   Diagnosis Date Noted  . History of total knee replacement, left 11/04/2018  . Preventative health care 06/16/2016  . Knee pain 06/16/2016    Left S/p R TKR   . OSA (obstructive sleep apnea) 06/15/2016    Pt at [facility], Dr. Rexene Alberts. They plan to start CPAP for her at home   . Depression  05/14/2016  . Multiple thyroid nodules 11/24/2015  . Degenerative arthritis of lumbar spine 09/03/2014    07/13/13 is degenerative changes throughout the visualized spine most evident from L3-L4 to L5-S1 with there is moderate to severe loss of disc height and endplate osteophytes and sclerosis.  There is also bone demineralization.   . Hyponatremia 12/12/2013  . Hypokalemia 12/12/2013  . OA (osteoarthritis) of knee 12/11/2013  . Osteoarthritis 11/14/2013    2010 intra-articular steroids X 3 S/P Physical Therapy in 12/14 TKR 12/11/13, right Follows Dr Leda Quail, injections   . BENIGN POSITIONAL VERTIGO 01/17/2010    Qualifier: Diagnosis of  By: Linna Darner MD, Gwyndolyn Saxon     . Urinary incontinence 04/26/2009    Qualifier: Diagnosis of  By: Linna Darner MD, Gwyndolyn Saxon     . Hyperlipidemia, mixed 02/04/2009    Qualifier: Diagnosis of  By: Linna Darner MD, Gwyndolyn Saxon     . Vitamin D deficiency 10/23/2008    Qualifier: Diagnosis of  By: Linna Darner MD, Lianne Cure Vit D 31    . POLYARTHRALGIA 10/23/2008    Qualifier: Diagnosis of  By: Linna Darner MD, Gwyndolyn Saxon     . Osteoporosis 10/23/2008    Qualifier: Diagnosis of  By: Linna Darner MD, William   BMD @  [facility] 02/23/14  ; ordered by  Dr Linna Darner T score - 2.5 @ L distal radius Since 09/30/11 significant  loss @ hip of 5.6%  Diagnosis: Osteoporosis FRAX: 3.8% /14.1 % Rx: no PMH fracture of ankle as child ; no  PMH GERD  FH Osteoporosis: ? Mother Ca++ as liquid ; vit D3 1000 IU qd; walking 60 min qd    . NEOPLASM, MALIGNANT, LUNG, NON-SMALL CELL 11/21/2007    Qualifier: Diagnosis of  By: Ronnald Ramp RN, [place], Sheri   2002 right upper lobectomy without radiation or chemotherapy ; monitored  by Dr. Earlie Server until 2013    . DIVERTICULOSIS 06/15/2007    Qualifier: Diagnosis of  By: Linna Darner MD, Ericka Pontiff, NODULAR 04/14/2007    Qualifier: Diagnosis of  By: Linna Darner MD, Gwyndolyn Saxon   Thyroid ultrasound monitored in [place], [place]; last [place] 06/2012 three  nodules in the right lobe: 7 x 5 x 5 mm; 14 x 11 x 8 mm; and 4 x 3 x 3 mm. One nodule in the left 5 x 3 x 3 mm. Prior history of unsuccessful biopsy. No past history of facial or neck irradiation.       Social History   Occupational History  . Occupation: Retired    Fish farm manager: RETIRED  Tobacco Use  . Smoking status: Former Smoker    Years: 20.00    Types: Cigarettes    Last attempt to quit: 05/03/1979    Years since quitting: 39.6  . Smokeless tobacco: Never Used  . Tobacco comment: smoked 1962-1980, up to < 1 ppd  Substance and Sexual Activity  . Alcohol use: Yes    Alcohol/week: 7.0 standard drinks    Types: 7 Standard drinks or equivalent per week    Comment: occasional  . Drug use: No  . Sexual activity: Never    Comment: lives alone, dog, no dietary restrictions, eating heart healthy   Social History   Social History Narrative   Pt gets regular exercise.   Drinks 2 cups of coffee a day     Past Medical History:  Diagnosis Date  . Complication of anesthesia    slow to awaken  . Depression   . Diverticulosis   . Goiter, nodular    [place] of thyroid stable 7/08  . Headache    hx of  . Heart murmur    due to rheumatic fever as child  . Mitral valve prolapse   . Multiple thyroid nodules 11/24/2015  . Neoplasm of lung, malignant ([place]) 2002   Non-small cell surgical tx  . Osteoarthritis 11/14/2013   2010 intra-articular steroids X 3 S/P Physical Therapy in 12/14 TKR 12/11/13   . Osteopenia    Dr Pamala Hurry  . PONV (postoperative nausea and vomiting)    n/v  . Sleep apnea   . Vitamin D deficiency      Past Surgical History:  Procedure Laterality Date  . ABDOMINAL HYSTERECTOMY  1998   No BSO, for dysfunctional menses  . BLADDER SUSPENSION Bilateral   . BREAST BIOPSY Right   . COLONOSCOPY  2009 , 2014   Diverticulosis; Dr. Olevia Perches  . LOBECTOMY  12/02   RU; no radiation or chemo, Dr. Arlyce Dice  . TONSILLECTOMY    . TOTAL KNEE ARTHROPLASTY Right 12/11/2013    Procedure: RIGHT TOTAL KNEE ARTHROPLASTY;  Surgeon: Gearlean Alf, MD;  Location: [facility];  Service: Orthopedics;  Laterality: Right;  . TOTAL KNEE ARTHROPLASTY Left 06/28/2017   Procedure: LEFT TOTAL KNEE ARTHROPLASTY;  Surgeon: Gaynelle Arabian, MD;  Location: [facility];  Service: Orthopedics;  Laterality: Left;  Adductor Block    family history  includes Arthritis in her sister; COPD in her maternal grandfather and mother; Cancer in her paternal aunt; Colon cancer (age of onset: 69) in her father; Coronary artery disease in her father; Heart attack (age of onset: 69) in her father; Lung cancer in her maternal grandfather, maternal uncle, and paternal aunt; Stroke in her father. There is no history of Diabetes.  OBJECTIVE:  VS:  HT:5\' 6"  (167.6 cm)   WT:171 lb 9.6 oz (77.8 kg)  BMI:27.71    BP:138/88  HR:79bpm  TEMP: ( )  RESP:95 %   PHYSICAL EXAM: Well-developed, Well-nourished and In no acute distress  Pupils are equal., EOM intact without nystagmus. and No scleral icterus.  Alert & appropriately interactive. and Not depressed or anxious appearing.  Warm and well perfused   Patient has significant antalgic gait with a large lurch the right worse than the.  She holds her a sniffing position with shoulders and arms extended for counterbalance.  She has good internal and external rotation of bilateral no pain with this.  She has weakness with hip flexion on the right but a negative Stinchfield test.  Her hip strength is markedly weak with side-lying bilaterally worse on the.  She has a marked TFL treatment pattern on the right effectively no glute activation.  Her knee flexion and extension strength and range of motion are good bilaterally.  She is status post bilateral total knee.   ASSESSMENT:  1. Chronic bilateral low back pain without sciatica   2. Presence of both artificial knee joints   3. Weakness of both hips   4. Gait disturbance     PROCEDURES:  PROCEDURE NOTE: THERAPEUTIC  EXERCISES (17510)   Discussed the foundation of treatment for this condition is physical therapy and/or daily (5-6 days/week) therapeutic exercises, focusing on core strengthening, coordination, neuromuscular control/reeducation. 15 minutes spent for Therapeutic exercises as below and as referenced in the AVS. This included exercises focusing on stretching, strengthening, with significant focus on eccentric aspects.  Proper technique shown and discussed handout in great detail with ATC. All questions were discussed and answered.   Long term goals include an improvement in range of motion, strength, endurance as well as avoiding reinjury. Frequency of visits is one time as determined during today's office visit. Frequency of exercises to be performed is as per handout.  EXERCISES REVIEWED:  Hip ABduction strengthening with focus on Glute Medius Recruitment      PLAN:  Pertinent additional documentation may be included in corresponding procedure notes, imaging studies, problem based documentation and patient instructions.  Patient has markedly weak hip abductor's right worse than left.  This is causing an Trendelenburg lurch gait with a sniffing position head position.  She will benefit from formal physical therapy focusing on gait training and hip abduction strengthening.  Home therapeutic exercises to begin with today including clamshell exercises and modified glute bridges for just having glued activation discussed and reviewed in detail per procedure note.  Ultimately her back has significant arthritic changes given the lack of pain and overall good ability to ambulate, although abnormally, likelihood of severe spinal stenosis is low.  She has no neural tension signs.  Home Therapeutic exercises prescribed today per procedure note. Referral to physical therapy placed today  Activity modifications and the importance of avoiding exacerbating activities (limiting pain to no more than a 4 / 10  during or following activity) recommended and discussed.   Discussed red flag symptoms that warrant earlier emergent evaluation and patient voices understanding.  No orders of the defined types were placed in this encounter.  Lab Orders  No laboratory test(s) ordered today    Imaging Orders     DG Lumbar Spine Complete  Referral Orders     Ambulatory referral to Physical Therapy   Return in about 6 weeks (around 02/13/2019).  Repeat clinical exam to evaluate for improved hip abduction strength and improved gait.         Gerda Diss, [facility] Physician

## 2019-01-02 NOTE — Patient Instructions (Addendum)
Please perform the exercise program that we have prepared for you and gone over in detail on a daily basis.  In addition to the handout you were provided you can access your program through: [email or web address]   Your unique program code is:  [email or web address]

## 2019-01-12 ENCOUNTER — Ambulatory Visit: Payer: Medicare Other | Attending: Sports Medicine | Admitting: Physical Therapy

## 2019-01-12 ENCOUNTER — Encounter: Payer: Self-pay | Admitting: Physical Therapy

## 2019-01-12 DIAGNOSIS — M545 Low back pain: Secondary | ICD-10-CM | POA: Diagnosis present

## 2019-01-12 DIAGNOSIS — M6281 Muscle weakness (generalized): Secondary | ICD-10-CM | POA: Insufficient documentation

## 2019-01-12 DIAGNOSIS — R293 Abnormal posture: Secondary | ICD-10-CM | POA: Insufficient documentation

## 2019-01-12 DIAGNOSIS — G8929 Other chronic pain: Secondary | ICD-10-CM

## 2019-01-12 NOTE — Therapy (Signed)
[facility] [place], [place], [address] Phone: [phone]   Fax:  [phone]  Physical Therapy Evaluation  Patient Details  Name: Maria [name] MRN: [id number] Date of Birth: 02/18/1936 Referring Provider (PT): Dr Teresa Coombs   Encounter Date: 01/12/2019  PT End of Session - 01/12/19 1013    Visit Number  1    Number of Visits  12    Date for PT Re-Evaluation  02/23/19    PT Start Time  0932    PT Stop Time  1015    PT Time Calculation (min)  43 min    Activity Tolerance  Patient tolerated treatment well       Past Medical History:  Diagnosis Date  . Complication of anesthesia    slow to awaken  . Depression   . Diverticulosis   . Goiter, nodular    [place] of thyroid stable 7/08  . Headache    hx of  . Heart murmur    due to rheumatic fever as child  . Mitral valve prolapse   . Multiple thyroid nodules 11/24/2015  . Neoplasm of lung, malignant ([place]) 2002   Non-small cell surgical tx  . Osteoarthritis 11/14/2013   2010 intra-articular steroids X 3 S/P Physical Therapy in 12/14 TKR 12/11/13   . Osteopenia    Dr Pamala Hurry  . PONV (postoperative nausea and vomiting)    n/v  . Sleep apnea   . Vitamin D deficiency     Past Surgical History:  Procedure Laterality Date  . ABDOMINAL HYSTERECTOMY  1998   No BSO, for dysfunctional menses  . BLADDER SUSPENSION Bilateral   . BREAST BIOPSY Right   . COLONOSCOPY  2009 , 2014   Diverticulosis; Dr. Olevia Perches  . LOBECTOMY  12/02   RU; no radiation or chemo, Dr. Arlyce Dice  . TONSILLECTOMY    . TOTAL KNEE ARTHROPLASTY Right 12/11/2013   Procedure: RIGHT TOTAL KNEE ARTHROPLASTY;  Surgeon: Gearlean Alf, MD;  Location: [facility];  Service: Orthopedics;  Laterality: Right;  . TOTAL KNEE ARTHROPLASTY Left 06/28/2017   Procedure: LEFT TOTAL KNEE ARTHROPLASTY;  Surgeon: Gaynelle Arabian, MD;  Location: [facility];  Service: Orthopedics;  Laterality: Left;  Adductor Block     There were no vitals filed for this visit.   Subjective Assessment - 01/12/19 1025    Subjective  Pt presents with h/o low back pain for years and since having her knees replaced along with difficulty walking.  Pain is intermittent    Pertinent History  bilat TKAs, OA, osteopenia in the spine, porosis in the wrists.     Limitations  Sitting;Lifting;Standing;Walking    Patient Stated Goals  have less pain, stand up straighter and walk better    Currently in Pain?  No/denies   has increased Rt sided low back pain with prolonged walking and standing, better with sitting down         [facility] PT Assessment - 01/12/19 0001      Assessment   Medical Diagnosis  Chronic LBP    Referring Provider (PT)  Dr Teresa Coombs    Onset Date/Surgical Date  01/11/17    Next MD Visit  6 wks    Prior Therapy  yes, last time 2 yrs ago      Precautions   Precautions  None      Balance Screen   Has the patient fallen in the past 6 months  --   NA - computer down  Home Environment   Living Environment  Private residence    Living Arrangements  Alone      Prior Function   Level of [place]  walk her dog      Observation/Other Assessments   Focus on Therapeutic Outcomes (FOTO)   50% limited      Posture/Postural Control   Posture/Postural Control  Postural limitations    Postural Limitations  Rounded Shoulders;Forward head;Increased thoracic kyphosis   Lt shoulder complex elevated, posterior trunk lean with stan     ROM / Strength   AROM / PROM / Strength  AROM;PROM;Strength      AROM   AROM Assessment Site  Knee;Lumbar    Right/Left Knee  Left;Right    Right Knee Extension  0    Right Knee Flexion  131    Left Knee Extension  -5    Left Knee Flexion  135    Lumbar Flexion  to mid shin     Lumbar Extension  25% present, pain with overpressure    Lumbar - Right Side Bend  WNL    Lumbar - Left Side Bend  25% present       PROM   PROM Assessment Site   Hip    Right/Left Hip  --   bilat WNL, popping in Lt hip with motion     Strength   Strength Assessment Site  Hip;Knee    Right/Left Hip  Right;Left    Right Hip Flexion  5/5    Right Hip Extension  4+/5    Right Hip ABduction  2+/5    Left Hip Flexion  5/5    Left Hip Extension  4/5    Left Hip ABduction  3+/5    Right/Left Knee  --   bilat WNL     Flexibility   Soft Tissue Assessment /Muscle Length  yes    Hamstrings  bilat WNL    Quadriceps  tight, prone knee flexion Rt 117, Lt 119      Palpation   Spinal mobility  lumbar and lower throacic CPA WNL with grade II     Palpation comment  tenderness and palpable trigger points in bilat gluts       Special Tests   Other special tests  NA      Ambulation/Gait   Gait Pattern  Trunk flexed;Wide base of support;Trendelenburg                Objective measurements completed on examination: See above findings.      [facility] PT Treatment/Exercise - 01/12/19 0001      Exercises   Exercises  Knee/Hip      Knee/Hip Exercises: Standing   Hip Extension  Stengthening;Both;10 reps   VC for form     Knee/Hip Exercises: Sidelying   Clams  10 regular and reverse       Knee/Hip Exercises: Prone   Other Prone Exercises  POE with lifitng chest             PT Education - 01/12/19 1013    Education Details  HEP     Person(s) Educated  Patient    Methods  Explanation;Demonstration    Comprehension  Returned demonstration;Verbal cues required;Verbalized understanding          PT Long Term Goals - 01/12/19 1409      PT LONG TERM GOAL #1   Title  I with advanced HEP ( 02/23/2019)  Time  6    Period  Weeks    Status  New    Target Date  02/23/19      PT LONG TERM GOAL #2   Title  improve FOTO =/< 44% limited ( 02/23/2019)      Time  6    Period  Weeks    Status  New    Target Date  02/23/19      PT LONG TERM GOAL #3   Title  report =/> 50% reduction of pain after taking her dog on her 2 mile walk  ( 02/23/2019)     Time  6    Period  Weeks    Status  New    Target Date  02/23/19      PT LONG TERM GOAL #4   Title  improve bilat hip abduction strength =/> 4+/5 to allow for no obvious trendelenburg with gait ( 02/23/2019)     Time  6    Period  Weeks    Status  New    Target Date  02/23/19             Plan - 01/12/19 1414    Clinical Impression Statement  83 yo female presents with multiple medical issues to include LBP and gait abnormalities.  She is becoming more limited in her mobility d/t her pain.  She has significant weakness in bilat hip abduction and core, gait abnormalities , postural changes and muscle tightness in bilat gluts.  She would benefit from PT to address these defecits and maximize her function.     Personal Factors and Comorbidities  Age;Time since onset of injury/illness/exacerbation;Comorbidity 3+    Comorbidities  bilat TKA, osteopenia in the spine, heart dx, h/o lung CA yrs ago, OA, bladder tacking.     Examination-Activity Limitations  Stairs    Examination-Participation Restrictions  Laundry;Community Activity;Yard Work    [employer] officer  Evolving/Moderate complexity    Clinical Decision Making  Moderate    Rehab Potential  Good    PT Frequency  2x / week    PT Duration  6 weeks    PT Treatment/Interventions  Balance training;Neuromuscular re-education;Passive range of motion;Dry needling;Manual techniques;Ultrasound;Cryotherapy;Electrical Stimulation;Moist Heat;Therapeutic exercise;Therapeutic activities;Patient/family education;Taping    PT Next Visit Plan  progress hip strengthening and balance    Consulted and Agree with Plan of Care  Patient       Patient will benefit from skilled therapeutic intervention in order to improve the following deficits and impairments:  Abnormal gait, Pain, Postural dysfunction, Increased muscle spasms, Decreased strength, Difficulty walking  Visit Diagnosis: Muscle weakness (generalized) -  Plan: PT plan of care cert/re-cert  Abnormal posture - Plan: PT plan of care cert/re-cert  Chronic bilateral low back pain without sciatica - Plan: PT plan of care cert/re-cert     Problem List Patient Active Problem List   Diagnosis Date Noted  . History of total knee replacement, left 11/04/2018  . Preventative health care 06/16/2016  . Knee pain 06/16/2016  . OSA (obstructive sleep apnea) 06/15/2016  . Depression 05/14/2016  . Multiple thyroid nodules 11/24/2015  . Degenerative arthritis of lumbar spine 09/03/2014  . Hyponatremia 12/12/2013  . Hypokalemia 12/12/2013  . OA (osteoarthritis) of knee 12/11/2013  . Osteoarthritis 11/14/2013  . BENIGN POSITIONAL VERTIGO 01/17/2010  . Urinary incontinence 04/26/2009  . Hyperlipidemia, mixed 02/04/2009  . Vitamin D deficiency 10/23/2008  . POLYARTHRALGIA 10/23/2008  . Osteoporosis 10/23/2008  . NEOPLASM, MALIGNANT, LUNG, NON-SMALL CELL 11/21/2007  .  DIVERTICULOSIS 06/15/2007  . GOITER, NODULAR 04/14/2007    Jeral Pinch PT  01/12/2019, 2:18 PM  [facility] [address] [place], [place], [address] Phone: [phone]   Fax:  [phone]  Name: Maria [name] MRN: [id number] Date of Birth: 11-02-1936

## 2019-01-16 ENCOUNTER — Ambulatory Visit: Payer: Medicare Other | Admitting: Physical Therapy

## 2019-01-16 ENCOUNTER — Other Ambulatory Visit: Payer: Self-pay | Admitting: Family Medicine

## 2019-01-16 ENCOUNTER — Other Ambulatory Visit: Payer: Self-pay

## 2019-01-16 ENCOUNTER — Encounter: Payer: Self-pay | Admitting: Physical Therapy

## 2019-01-16 DIAGNOSIS — M545 Low back pain, unspecified: Secondary | ICD-10-CM

## 2019-01-16 DIAGNOSIS — R293 Abnormal posture: Secondary | ICD-10-CM

## 2019-01-16 DIAGNOSIS — M6281 Muscle weakness (generalized): Secondary | ICD-10-CM | POA: Diagnosis not present

## 2019-01-16 DIAGNOSIS — G8929 Other chronic pain: Secondary | ICD-10-CM

## 2019-01-16 NOTE — Telephone Encounter (Signed)
Copied from [place] [phone]. Topic: Quick Communication - Rx Refill/Question >> Jan 16, 2019 11:19 AM Alanda Slim E wrote: Medication:  DULoxetine (CYMBALTA) 30 MG capsule  Has the patient contacted their pharmacy? Yes - pharmacy advised they sent request on 3.13.20   Preferred Pharmacy (with phone number or street name): [id number] - [place], [place] [phone] (Phone) [phone] (Fax)    Agent: Please be advised that RX refills may take up to 3 business days. We ask that you follow-up with your pharmacy.

## 2019-01-16 NOTE — Therapy (Signed)
[place], [place], [address] Phone: [phone]   Fax:  [phone]  Physical Therapy Treatment  Patient Details  Name: Maria [name] MRN: [id number] Date of Birth: 1936/04/06 Referring Provider (PT): Dr Teresa Coombs   Encounter Date: 01/16/2019  PT End of Session - 01/16/19 0813    Visit Number  2    Number of Visits  12    Date for PT Re-Evaluation  02/23/19    PT Start Time  0806    PT Stop Time  0845    PT Time Calculation (min)  39 min    Activity Tolerance  Patient tolerated treatment well    Behavior During Therapy  [facility] for tasks assessed/performed       Past Medical History:  Diagnosis Date  . Complication of anesthesia    slow to awaken  . Depression   . Diverticulosis   . Goiter, nodular    [place] of thyroid stable 7/08  . Headache    hx of  . Heart murmur    due to rheumatic fever as child  . Mitral valve prolapse   . Multiple thyroid nodules 11/24/2015  . Neoplasm of lung, malignant ([place]) 2002   Non-small cell surgical tx  . Osteoarthritis 11/14/2013   2010 intra-articular steroids X 3 S/P Physical Therapy in 12/14 TKR 12/11/13   . Osteopenia    Dr Pamala Hurry  . PONV (postoperative nausea and vomiting)    n/v  . Sleep apnea   . Vitamin D deficiency     Past Surgical History:  Procedure Laterality Date  . ABDOMINAL HYSTERECTOMY  1998   No BSO, for dysfunctional menses  . BLADDER SUSPENSION Bilateral   . BREAST BIOPSY Right   . COLONOSCOPY  2009 , 2014   Diverticulosis; Dr. Olevia Perches  . LOBECTOMY  12/02   RU; no radiation or chemo, Dr. Arlyce Dice  . TONSILLECTOMY    . TOTAL KNEE ARTHROPLASTY Right 12/11/2013   Procedure: RIGHT TOTAL KNEE ARTHROPLASTY;  Surgeon: Gearlean Alf, MD;  [facility]: [facility];  Service: Orthopedics;  Laterality: Right;  . TOTAL KNEE ARTHROPLASTY Left 06/28/2017   Procedure: LEFT TOTAL KNEE ARTHROPLASTY;  Surgeon: Gaynelle Arabian, MD;  [facility]: [facility];   Service: Orthopedics;  Laterality: Left;  Adductor Block    There were no vitals filed for this visit.  Subjective Assessment - 01/16/19 0811    Subjective  Pt arrives for 1 st appt.  Has no pain.  Is here to work on hte muscles of my hips.     Currently in Pain?  No/denies    Pain Score  3    with walk this AM, about 30 min    Pain Location  Back    Pain Orientation  Right;Left;Lower    Pain Descriptors / Indicators  Aching    Pain Type  Chronic pain    Pain Onset  More than a month ago    Pain Frequency  Intermittent    Aggravating Factors   standing, walking    Pain Relieving Factors  sitting    Effect of Pain on Daily Activities  limits her ability to stand and walk          [facility] Adult PT Treatment/Exercise - 01/16/19 0001      Knee/Hip Exercises: Stretches   Piriformis Stretch  3 reps;30 seconds      Knee/Hip Exercises: Aerobic   Nustep  5 min LE only  Knee/Hip Exercises: Supine   Bridges  Strengthening;Both;1 set;10 reps    Bridges Limitations  cues to breathe       Knee/Hip Exercises: Sidelying   Other Sidelying Knee/Hip Exercises  clam x 20 and reverse clam x 20     Other Sidelying Knee/Hip Exercises  hydrant x 10 , hydrant with knee extension x 10       Knee/Hip Exercises: Prone   Other Prone Exercises  POE x 10 with lifitng chest             PT Education - 01/16/19 0832    Education Details  form with exercises     Person(s) Educated  Patient    Methods  Explanation;Demonstration;Tactile cues;Verbal cues    Comprehension  Verbalized understanding;Returned demonstration          PT Long Term Goals - 01/16/19 0827      PT LONG TERM GOAL #1   Title  I with advanced HEP ( 02/23/2019)     Status  On-going      PT LONG TERM GOAL #2   Title  improve FOTO =/< 44% limited ( 02/23/2019)      Status  On-going      PT LONG TERM GOAL #3   Title  report =/> 50% reduction of pain after taking her dog on her 2 mile walk ( 02/23/2019)     Status   On-going      PT LONG TERM GOAL #4   Title  improve bilat hip abduction strength =/> 4+/5 to allow for no obvious trendelenburg with gait ( 02/23/2019)     Status  On-going            Plan - 01/16/19 5462    Clinical Impression Statement  Patient did well today with iniital HEP , needed rare cues for form.  She is committed to this helping her and she stated that throughout the session.      Personal Factors and Comorbidities  Age;Time since onset of injury/illness/exacerbation;Comorbidity 3+    Examination-Participation Restrictions  Laundry;Community Activity;[employer] Work    PT Treatment/Interventions  Administrator re-education;Passive range of motion;Dry needling;Manual techniques;Ultrasound;Cryotherapy;Electrical Stimulation;Moist Heat;Therapeutic exercise;Therapeutic activities;Patient/family education;Taping    PT Next Visit Plan  progress hip strengthening and balance    PT Home Exercise Plan  clam, reverse clam, standing ext, baby cobra    Consulted and Agree with Plan of Care  Patient       Patient will benefit from skilled therapeutic intervention in order to improve the following deficits and impairments:  Abnormal gait, Pain, Postural dysfunction, Increased muscle spasms, Decreased strength, Difficulty walking  Visit Diagnosis: Muscle weakness (generalized)  Abnormal posture  Chronic bilateral low back pain without sciatica     Problem List Patient Active Problem List   Diagnosis Date Noted  . History of total knee replacement, left 11/04/2018  . Preventative health care 06/16/2016  . Knee pain 06/16/2016  . OSA (obstructive sleep apnea) 06/15/2016  . Depression 05/14/2016  . Multiple thyroid nodules 11/24/2015  . Degenerative arthritis of lumbar spine 09/03/2014  . Hyponatremia 12/12/2013  . Hypokalemia 12/12/2013  . OA (osteoarthritis) of knee 12/11/2013  . Osteoarthritis 11/14/2013  . BENIGN POSITIONAL VERTIGO 01/17/2010  . Urinary  incontinence 04/26/2009  . Hyperlipidemia, mixed 02/04/2009  . Vitamin D deficiency 10/23/2008  . POLYARTHRALGIA 10/23/2008  . Osteoporosis 10/23/2008  . NEOPLASM, MALIGNANT, LUNG, NON-SMALL CELL 11/21/2007  . DIVERTICULOSIS 06/15/2007  . GOITER, NODULAR 04/14/2007    Shayanne Gomm 01/16/2019,  8:53 AM  [place], [place], [address] Phone: [phone]   Fax:  [phone]  Name: Maria [name] MRN: [id number] Date of Birth: [birth year]  Raeford Razor, PT 01/16/19 8:53 AM Phone: [phone] Fax: [phone]

## 2019-01-16 NOTE — Patient Instructions (Signed)
Bridge [employer] small of back into mat, maintain pelvic tilt, roll up one vertebrae at a time. Focus on engaging posterior hip muscles. Hold for __5 sec __ breaths. Repeat ___10_ times.  Copyright  VHI. All rights reserved.

## 2019-01-25 ENCOUNTER — Ambulatory Visit: Payer: Medicare Other | Admitting: Physical Therapy

## 2019-01-31 ENCOUNTER — Ambulatory Visit: Payer: Medicare Other | Admitting: Physical Therapy

## 2019-02-01 ENCOUNTER — Telehealth: Payer: Self-pay | Admitting: Physical Therapy

## 2019-02-01 NOTE — Telephone Encounter (Signed)
Maria [name] was contacted yesterday 01/31/2019 with follow up today regarding the temporary reduction of OP Rehab Services due to concerns for community transmission of Covid-19.    Therapist advised the patient to continue to perform their HEP and assured they had no unanswered questions at this time.  The patient expressed interest in being contacted for an e-visit, virtual check in, or telehealth visit to continue their POC care, when those services become available.     Outpatient Rehabilitation Services will follow up with patients at that time.

## 2019-02-02 ENCOUNTER — Ambulatory Visit: Payer: Medicare Other | Admitting: Physical Therapy

## 2019-02-09 ENCOUNTER — Ambulatory Visit: Payer: Medicare Other | Admitting: Physical Therapy

## 2019-02-13 ENCOUNTER — Ambulatory Visit: Payer: Medicare Other | Admitting: Sports Medicine

## 2019-02-14 ENCOUNTER — Encounter: Payer: Medicare Other | Admitting: Physical Therapy

## 2019-02-16 ENCOUNTER — Encounter: Payer: Medicare Other | Admitting: Physical Therapy

## 2019-02-21 ENCOUNTER — Encounter: Payer: Medicare Other | Admitting: Physical Therapy

## 2019-02-23 ENCOUNTER — Encounter: Payer: Medicare Other | Admitting: Physical Therapy

## 2019-02-28 ENCOUNTER — Encounter: Payer: Medicare Other | Admitting: Physical Therapy

## 2019-03-02 ENCOUNTER — Encounter: Payer: Medicare Other | Admitting: Physical Therapy

## 2019-03-06 ENCOUNTER — Telehealth: Payer: Self-pay | Admitting: Physical Therapy

## 2019-03-06 NOTE — Telephone Encounter (Signed)
Maria [name] was contacted today regarding temporary reduction of Outpatient Rehabilitation Services due to concerns for community transmission of COVID-19.   The patient was offered the continuation of their plan of care by using a telehealth visit or possibly an in clinic appointment depending on acuity of her condition.  I asked that she call us to let us know how we can further assist her therapy needs.    Patient is aware we can be reached by telephone during limited business hours in the meantime.   Raeford Razor, PT 03/06/19 3:03 PM Phone: [phone] Fax: [phone]

## 2019-03-13 ENCOUNTER — Other Ambulatory Visit: Payer: Self-pay

## 2019-03-13 ENCOUNTER — Encounter: Payer: Self-pay | Admitting: Physical Therapy

## 2019-03-13 ENCOUNTER — Ambulatory Visit: Payer: Medicare Other | Attending: Sports Medicine | Admitting: Physical Therapy

## 2019-03-13 DIAGNOSIS — R293 Abnormal posture: Secondary | ICD-10-CM | POA: Diagnosis present

## 2019-03-13 DIAGNOSIS — M545 Low back pain: Secondary | ICD-10-CM | POA: Insufficient documentation

## 2019-03-13 DIAGNOSIS — M6281 Muscle weakness (generalized): Secondary | ICD-10-CM | POA: Diagnosis not present

## 2019-03-13 DIAGNOSIS — G8929 Other chronic pain: Secondary | ICD-10-CM | POA: Diagnosis present

## 2019-03-13 NOTE — Patient Instructions (Signed)
Prepared By: [place], [place]  Phone: [phone]  Step 1  Step 2  Supine Hamstring Stretch with Strap reps: 3  sets: 2  hold: 30  daily: 2  weekly: 7 Setup  Begin lying on your back with your legs straight, holding the end of a strap that is looped around one foot.  Movement  Use the strap to pull your leg up toward your body until you feel a gentle stretch in the back of your upper leg. Hold this position. Tip  Make sure to keep your other leg straight on the ground during the stretch. Step 1  Step 2  Bridge with Arms at [employer] and Feet on [employer] reps: 10  sets: 2  hold: 5  daily: 1  weekly: 7 Setup  Lie on your back with your feet resting on a swiss ball and your arms positioned next to your body, palms facing down.  Movement  Lift your bottom off the floor until your body is straight. Tip  Do not allow your back to arch. Step 1  Step 2  Bent Knee Fallouts reps: 10  sets: 2  hold: 5  daily: 1  weekly: 7 Push out with both knees or one at a time or both at once. Use the green band for more resistance.   Setup  Begin lying on your back with your knees bent and feet resting on the floor. Movement  Engage your abdominals and slowly lower one knee towards the ground. Return to the starting position and repeat with the other leg. Tip  Make sure to breathe and do not allow your hips or trunk to rotate during the exercise.

## 2019-03-13 NOTE — Therapy (Signed)
[place], [place], [address] Phone: [phone]   Fax:  [phone]  Physical Therapy Treatment/Re-Evaluation   Patient Details  Name: Maria [name] MRN: [id number] Date of Birth: 1935-12-30 Referring Provider (PT): Dr Teresa Coombs   Encounter Date: 03/13/2019  PT End of Session - 03/13/19 0828    Visit Number  3    Number of Visits  12    PT Start Time  0830    PT Stop Time  0915    PT Time Calculation (min)  45 min    Activity Tolerance  Patient tolerated treatment well    Behavior During Therapy  [facility] for tasks assessed/performed       Past Medical History:  Diagnosis Date  . Complication of anesthesia    slow to awaken  . Depression   . Diverticulosis   . Goiter, nodular    [place] of thyroid stable 7/08  . Headache    hx of  . Heart murmur    due to rheumatic fever as child  . Mitral valve prolapse   . Multiple thyroid nodules 11/24/2015  . Neoplasm of lung, malignant ([place]) 2002   Non-small cell surgical tx  . Osteoarthritis 11/14/2013   2010 intra-articular steroids X 3 S/P Physical Therapy in 12/14 TKR 12/11/13   . Osteopenia    Dr Pamala Hurry  . PONV (postoperative nausea and vomiting)    n/v  . Sleep apnea   . Vitamin D deficiency     Past Surgical History:  Procedure Laterality Date  . ABDOMINAL HYSTERECTOMY  1998   No BSO, for dysfunctional menses  . BLADDER SUSPENSION Bilateral   . BREAST BIOPSY Right   . COLONOSCOPY  2009 , 2014   Diverticulosis; Dr. Olevia Perches  . LOBECTOMY  12/02   RU; no radiation or chemo, Dr. Arlyce Dice  . TONSILLECTOMY    . TOTAL KNEE ARTHROPLASTY Right 12/11/2013   Procedure: RIGHT TOTAL KNEE ARTHROPLASTY;  Surgeon: Gearlean Alf, MD;  [facility];  Service: Orthopedics;  Laterality: Right;  . TOTAL KNEE ARTHROPLASTY Left 06/28/2017   Procedure: LEFT TOTAL KNEE ARTHROPLASTY;  Surgeon: Gaynelle Arabian, MD;  [facility];  Service: Orthopedics;   Laterality: Left;  Adductor Block    There were no vitals filed for this visit.  Subjective Assessment - 03/13/19 0832    Subjective  Has been walking about 2 miles a day.  I am always in pain.  Not having pain.  Stiffness in her back.  Usually on Rt side, SIJ. Vertigo over the past few weeks, has had it before.      Currently in Pain?  No/denies         [facility] PT Assessment - 03/13/19 0001      Assessment   Medical Diagnosis  Chronic LBP    Referring Provider (PT)  Dr Teresa Coombs    Onset Date/Surgical Date  01/11/17    Next MD Visit  end of the month    Prior Therapy  yes, last time 2 yrs ago      Precautions   Precautions  None      Balance Screen   Has the patient fallen in the past 6 months  No      [place] residence    Living Arrangements  Alone      Prior Function   Level of Independence  Independent    Leisure  walk her  dog      Observation/Other Assessments   Focus on Therapeutic Outcomes (FOTO)   50%      AROM   Lumbar Flexion  to mid shin     Lumbar Extension  tight, limited 50%     Lumbar - Right Side Bend  tight, 50%    Lumbar - Left Side Bend  tight 50%    Lumbar - Right Rotation  stiff 25%     Lumbar - Left Rotation  stiff 25%       Strength   Right Hip Flexion  4+/5    Right Hip ABduction  2+/5    Left Hip Flexion  4+/5    Left Hip ABduction  3/5      Flexibility   Hamstrings  tight R>L           [facility] Adult PT Treatment/Exercise - 03/13/19 0001      Knee/Hip Exercises: Stretches   Active Hamstring Stretch  Right;Left;2 reps;30 seconds    Piriformis Stretch  3 reps;30 seconds      Knee/Hip Exercises: Aerobic   Stationary Bike  5 min L3      Knee/Hip Exercises: Supine   Administrator Limitations  with ball on mat     Other Supine Knee/Hip Exercises  clam bilateral green band x 20       Knee/Hip Exercises: Sidelying   Hip ABduction  Strengthening;Both;1 set;10 reps     Other Sidelying Knee/Hip Exercises  clam x 20     Other Sidelying Knee/Hip Exercises  hydrant x 10 , hydrant with knee extension x 10                   PT Long Term Goals - 03/13/19 8338      PT LONG TERM GOAL #1   Title  I with advanced HEP    Status  On-going    Target Date  04/24/19      PT LONG TERM GOAL #2   Title  improve FOTO =/< 44% limited     Time  6    Period  Weeks    Status  On-going    Target Date  04/24/19      PT LONG TERM GOAL #3   Title  report =/> 50% reduction of pain after taking her dog on her 2 mile walk (pain 3/10-4/10)    Baseline  can be moderate 6/10     Status  On-going    Target Date  04/24/19      PT LONG TERM GOAL #4   Title  improve bilat hip abduction strength =/> 4+/5 to allow for no obvious trendelenburg with gait ( 02/23/2019)     Status  On-going    Target Date  04/24/19            Plan - 03/13/19 0918    Clinical Impression Statement  Patient returns with several weeks off due to community COVID-19 outbreak. She has continued to walk and do minimal stretching.  She continues to have Rt hip and low back stiffness, weakness in bilateral hips.  She will benefit from skilled PT to improve her ability to walk her dog, maintain health.      Personal Factors and Comorbidities  Age;Time since onset of injury/illness/exacerbation;Comorbidity 3+    Comorbidities  bilat TKA, osteopenia in the spine, heart dx, h/o lung CA yrs ago, OA, bladder tacking.     Examination-Activity Limitations  Stairs;Locomotion Level    Examination-Participation Restrictions  Laundry;Community Activity;Yard Work    PT Frequency  2x / week    PT Duration  6 weeks    PT Treatment/Interventions  Balance training;Neuromuscular re-education;Passive range of motion;Dry needling;Manual techniques;Ultrasound;Cryotherapy;Electrical Stimulation;Moist Heat;Therapeutic exercise;Therapeutic activities;Patient/family education;Taping    PT Next Visit Plan  progress  hip strengthening and balance    PT Home Exercise Plan  clam, reverse clam, standing ext, baby cobra, hamstring stretch, clam with bands, bridge     Consulted and Agree with Plan of Care  Patient       Patient will benefit from skilled therapeutic intervention in order to improve the following deficits and impairments:  Abnormal gait, Pain, Postural dysfunction, Increased muscle spasms, Decreased strength, Difficulty walking  Visit Diagnosis: Muscle weakness (generalized)  Abnormal posture  Chronic bilateral low back pain without sciatica     Problem List Patient Active Problem List   Diagnosis Date Noted  . History of total knee replacement, left 11/04/2018  . Preventative health care 06/16/2016  . Knee pain 06/16/2016  . OSA (obstructive sleep apnea) 06/15/2016  . Depression 05/14/2016  . Multiple thyroid nodules 11/24/2015  . Degenerative arthritis of lumbar spine 09/03/2014  . Hyponatremia 12/12/2013  . Hypokalemia 12/12/2013  . OA (osteoarthritis) of knee 12/11/2013  . Osteoarthritis 11/14/2013  . BENIGN POSITIONAL VERTIGO 01/17/2010  . Urinary incontinence 04/26/2009  . Hyperlipidemia, mixed 02/04/2009  . Vitamin D deficiency 10/23/2008  . POLYARTHRALGIA 10/23/2008  . Osteoporosis 10/23/2008  . NEOPLASM, MALIGNANT, LUNG, NON-SMALL CELL 11/21/2007  . DIVERTICULOSIS 06/15/2007  . GOITER, NODULAR 04/14/2007    Tajha Sammarco 03/13/2019, 9:38 AM  [facility] [address] [place], [place], [address] Phone: [phone]   Fax:  [phone]  Name: Maria [name] MRN: [id number] Date of Birth: August 22, 1936   Raeford Razor, PT 03/13/19 9:39 AM Phone: [phone] Fax: [phone]

## 2019-03-14 ENCOUNTER — Encounter: Payer: Medicare Other | Admitting: Physical Therapy

## 2019-03-15 ENCOUNTER — Telehealth: Payer: Self-pay | Admitting: Neurology

## 2019-03-15 NOTE — Telephone Encounter (Signed)
COVID score is a 3.

## 2019-03-15 NOTE — Telephone Encounter (Signed)
Due to current COVID 19 pandemic, our office is severely reducing in office visits until further notice, in order to minimize the risk to our patients and healthcare providers.   Called patient to talk with her about her 5/19 appt. Patient is comfortable coming into the office and would prefer to do that. I explained that we are taking the necessary precautions against COVID-19 to keep patients and staff members safe. Patient understands that she will need to park next to entrance when she arrives and she will have her temp taken and will be screened for COVID-19. Patient understands that she will receive a call from RN to update chart.

## 2019-03-16 ENCOUNTER — Encounter: Payer: Medicare Other | Admitting: Physical Therapy

## 2019-03-20 ENCOUNTER — Encounter: Payer: Self-pay | Admitting: Physical Therapy

## 2019-03-20 ENCOUNTER — Other Ambulatory Visit: Payer: Self-pay

## 2019-03-20 ENCOUNTER — Ambulatory Visit: Payer: Medicare Other | Admitting: Physical Therapy

## 2019-03-20 DIAGNOSIS — R293 Abnormal posture: Secondary | ICD-10-CM

## 2019-03-20 DIAGNOSIS — G8929 Other chronic pain: Secondary | ICD-10-CM

## 2019-03-20 DIAGNOSIS — M6281 Muscle weakness (generalized): Secondary | ICD-10-CM | POA: Diagnosis not present

## 2019-03-20 NOTE — Therapy (Signed)
[place], [place], [address] Phone: [phone]   Fax:  [phone]  Physical Therapy Treatment  Patient Details  Name: Maria [name] MRN: [id number] Date of Birth: [birth year] Referring Provider (PT): Dr Teresa Coombs   Encounter Date: 03/20/2019  PT End of Session - 03/20/19 0845    Visit Number  4    Number of Visits  12    Date for PT Re-Evaluation  04/24/19    PT Start Time  0833    PT Stop Time  0915    PT Time Calculation (min)  42 min    Activity Tolerance  Patient tolerated treatment well    Behavior During Therapy  [facility] for tasks assessed/performed       Past Medical History:  Diagnosis Date  . Complication of anesthesia    slow to awaken  . Depression   . Diverticulosis   . Goiter, nodular    [place] of thyroid stable 7/08  . Headache    hx of  . Heart murmur    due to rheumatic fever as child  . Mitral valve prolapse   . Multiple thyroid nodules 11/24/2015  . Neoplasm of lung, malignant ([place]) 2002   Non-small cell surgical tx  . Osteoarthritis 11/14/2013   2010 intra-articular steroids X 3 S/P Physical Therapy in 12/14 TKR 12/11/13   . Osteopenia    Dr Pamala Hurry  . PONV (postoperative nausea and vomiting)    n/v  . Sleep apnea   . Vitamin D deficiency     Past Surgical History:  Procedure Laterality Date  . ABDOMINAL HYSTERECTOMY  1998   No BSO, for dysfunctional menses  . BLADDER SUSPENSION Bilateral   . BREAST BIOPSY Right   . COLONOSCOPY  2009 , 2014   Diverticulosis; Dr. Olevia Perches  . LOBECTOMY  12/02   RU; no radiation or chemo, Dr. Arlyce Dice  . TONSILLECTOMY    . TOTAL KNEE ARTHROPLASTY Right 12/11/2013   Procedure: RIGHT TOTAL KNEE ARTHROPLASTY;  Surgeon: Gearlean Alf, MD;  [facility]: [facility];  Service: Orthopedics;  Laterality: Right;  . TOTAL KNEE ARTHROPLASTY Left 06/28/2017   Procedure: LEFT TOTAL KNEE ARTHROPLASTY;  Surgeon: Gaynelle Arabian, MD;  [facility];   Service: Orthopedics;  Laterality: Left;  Adductor Block    There were no vitals filed for this visit.  Subjective Assessment - 03/20/19 0837    Subjective  No pain when I'm sitting.  I have trouble standing up straight with my back.     Currently in Pain?  No/denies         [facility] Adult PT Treatment/Exercise - 03/20/19 0001      Knee/Hip Exercises: Aerobic   Tread Mill  1.3 mph, 5 min       Knee/Hip Exercises: Standing   Hip Abduction  Stengthening;Both;1 set;10 reps    Abduction Limitations  weakness evident R LE >L LE     Lateral Step Up  Both;1 set;10 reps    Lateral Step Up Limitations  4 inch step       Knee/Hip Exercises: Seated   Sit to Sand  10 reps;with UE support;Other (comment)   hands on knees, used band around thighs      Knee/Hip Exercises: Supine   Bridges  Strengthening;Both;1 set;10 reps    Bridges Limitations  band around thighs     Straight Leg Raises  Strengthening;Both;1 set;10 reps    Other Supine Knee/Hip Exercises  clam bilateral green  x 20       Knee/Hip Exercises: Prone   Hip Extension  Strengthening;Both;2 sets;10 reps    Straight Leg Raises  Strengthening;Both;2 sets;10 reps                  PT Long Term Goals - 03/13/19 0858      PT LONG TERM GOAL #1   Title  I with advanced HEP    Status  On-going    Target Date  04/24/19      PT LONG TERM GOAL #2   Title  improve FOTO =/< 44% limited     Time  6    Period  Weeks    Status  On-going    Target Date  04/24/19      PT LONG TERM GOAL #3   Title  report =/> 50% reduction of pain after taking her dog on her 2 mile walk (pain 3/10-4/10)    Baseline  can be moderate 6/10     Status  On-going    Target Date  04/24/19      PT LONG TERM GOAL #4   Title  improve bilat hip abduction strength =/> 4+/5 to allow for no obvious trendelenburg with gait ( 02/23/2019)     Status  On-going    Target Date  04/24/19            Plan - 03/20/19 0856    Clinical Impression  Statement  Addressed hip weakness in all positions today, found prone hip ext and bridges to be most challenging.  She continues to be motivated and walks daily.     Personal Factors and Comorbidities  Age;Time since onset of injury/illness/exacerbation;Comorbidity 3+    Comorbidities  bilat TKA, osteopenia in the spine, heart dx, h/o lung CA yrs ago, OA, bladder tacking.     Examination-Activity Limitations  Stairs;Locomotion Level    Examination-Participation Restrictions  Laundry;Community Activity;Yard Work    [employer]chant [employer] officer  Evolving/Moderate complexity    Rehab Potential  Good    PT Treatment/Interventions  Balance training;Neuromuscular re-education;Passive range of motion;Dry needling;Manual techniques;Ultrasound;Cryotherapy;Electrical Stimulation;Moist Heat;Therapeutic exercise;Therapeutic activities;Patient/family education;Taping    PT Next Visit Plan  progress hip strengthening and balance    PT Home Exercise Plan  clam, reverse clam, standing ext, baby cobra, hamstring stretch, clam with bands, bridge     Consulted and Agree with Plan of Care  Patient       Patient will benefit from skilled therapeutic intervention in order to improve the following deficits and impairments:  Abnormal gait, Pain, Postural dysfunction, Increased muscle spasms, Decreased strength, Difficulty walking  Visit Diagnosis: Muscle weakness (generalized)  Abnormal posture  Chronic bilateral low back pain without sciatica     Problem List Patient Active Problem List   Diagnosis Date Noted  . History of total knee replacement, left 11/04/2018  . Preventative health care 06/16/2016  . Knee pain 06/16/2016  . OSA (obstructive sleep apnea) 06/15/2016  . Depression 05/14/2016  . Multiple thyroid nodules 11/24/2015  . Degenerative arthritis of lumbar spine 09/03/2014  . Hyponatremia 12/12/2013  . Hypokalemia 12/12/2013  . OA (osteoarthritis) of knee 12/11/2013  . Osteoarthritis  11/14/2013  . BENIGN POSITIONAL VERTIGO 01/17/2010  . Urinary incontinence 04/26/2009  . Hyperlipidemia, mixed 02/04/2009  . Vitamin D deficiency 10/23/2008  . POLYARTHRALGIA 10/23/2008  . Osteoporosis 10/23/2008  . NEOPLASM, MALIGNANT, LUNG, NON-SMALL CELL 11/21/2007  . DIVERTICULOSIS 06/15/2007  . GOITER, NODULAR 04/14/2007    , 03/20/2019, 9:24 AM  [place], [place], [address] Phone: [phone]   Fax:  [phone]  Name: AMARIZ FLAMENCO MRN: [id number] Date of Birth: 09-16-1936  Raeford Razor, PT 03/20/19 9:24 AM Phone: [phone] Fax: [phone]

## 2019-03-21 ENCOUNTER — Ambulatory Visit: Payer: Medicare Other | Admitting: Neurology

## 2019-03-21 ENCOUNTER — Encounter: Payer: Self-pay | Admitting: Neurology

## 2019-03-21 VITALS — BP 150/92 | HR 84 | Temp 96.9°F | Ht 66.0 in | Wt 175.0 lb

## 2019-03-21 DIAGNOSIS — G4733 Obstructive sleep apnea (adult) (pediatric): Secondary | ICD-10-CM

## 2019-03-21 DIAGNOSIS — Z9989 Dependence on other enabling machines and devices: Secondary | ICD-10-CM

## 2019-03-21 NOTE — Progress Notes (Signed)
Order for cpap supplies sent to [employer] via community message. Confirmation received that the order transmitted was successful.  

## 2019-03-21 NOTE — Progress Notes (Signed)
Subjective:    Patient ID: Maria [name] is a 83 y.o. female.  HPI     Interim history:   Maria [name] is a 83 year old right-handed lady with an underlying medical history non-small cell lung cancer, status post lobectomy in 2002, hypertension, depression, thyroid nodules, arthritis of both knees, status post right total knee arthroplasty, hyponatremia, hypokalemia, hyperlipidemia, vitamin D deficiency, osteoporosis, and overweight state, who presents for follow-up consultation of her sleep apnea, established on CPAP therapy. The patient is unaccompanied today and presents for her yearly checkup. I last saw her on 03/16/2018, at which time she had some lapses in treatment, she was housesitting for a friend in [place].  She had a total knee replacement on the left in August 2018.  She was traveling to [place].    Today, 03/21/2019: I reviewed her CPAP compliance data from 02/18/2019 through 03/19/2019 which is a total of 30 days, during which time she used her machine every night with percent used days greater than 4 hours at 93%, indicating excellent compliance with an average usage of 6 hours and 55 minutes, residual AHI at goal at 1.6/h, leak acceptable with a 95th percentile at 12.7 L/min on a pressure of 7 cm with EPR of 3.    She reports Doing well, sleeps well with the CPAP machine, stays up-to-date with her supplies and cleans her mask and supplies and water chamber regularly, changes of filter regularly, she has not had any recent illness thankfully has followed recommendation for social distancing and staying at home.  She is able to see her son's family and does that once a week by meeting up with them in the backyard, she has not traveled because of the COVID-19 pandemic.  She does not dislike having more "me" time, stays connected with friends and family. She has had ongoing issues with left knee pain, lower back pain, hip pain.  She has been seeing a new doctor.  She has been in  physical therapy which she was able to resume recently.    The patient's allergies, current medications, family history, past medical history, past social history, past surgical history and problem list were reviewed and updated as appropriate.    Previously (copied from previous notes for reference):   I saw her on 02/11/2017, at which time she reported doing well with CPAP, she was sleeping well with it. She was complaining of left knee pain, she was status post right total knee replacement.    I reviewed her CPAP compliance data from the last 30 days from 02/12/2018 through 03/13/2018 which is 30 days, during which time she used her CPAP 24 days with percent used days greater than 4 hours at 63%, indicating suboptimal compliance with an average usage of 6 hours and 36 minutes 4 days on treatment, residual AHI at goal at 1.2 per hour, leak acceptable with the 95th percentile at 13.6 L/m on a pressure of 7 cm with EPR of 3. In the past 90 days she has had a 4 hour or more compliant percentage of 70%.    I saw her on 08/12/2016, at which time we talked about her sleep study results from her baseline sleep study from 05/18/2016 as well as her CPAP titration results from 06/05/2016. She was established on CPAP therapy and compliant with it. She reported that her regarding her sleep with more restful sleep, better sleep consolidation. She was planning to take her CPAP machine to her trip to [place]. Stays  busy, particularly with her young grandchildren, her older son's kids (from his second marriage). She has 2 sons (in their 69s).   I reviewed her CPAP compliance data from 01/10/2017 through 02/08/2017, which is a total of 30 days, during which time she used her machine 29 days with percent used days greater than 4 hours at 93%, indicating excellent compliance with an average usage of 6 hours and 21 minutes, residual AHI 1.8 per hour, leak on the higher side for the 95th percentile at 23 L/m, pressure  of 7 cm with EPR of 3.    I first met her on 04/28/2016 at the request of her primary care physician, at which time she reported snoring and excessive daytime somnolence, morning headaches, sleep disruption and nonrestorative sleep. I invited her back for sleep study. She had a baseline sleep study, followed by a CPAP titration study.  Baseline sleep study from 05/18/2016 showed a sleep efficiency of 70.9%, latency to sleep was prolonged at 26 minutes and wake after sleep onset was increased at 91 minutes. She had an increased percentage of light stage sleep, slow-wave sleep was 30.5% and there was no REM sleep. She had occasional PVCs on EKG. She had mild snoring. Total AHI was elevated at 28.7 per hour. Average oxygen saturation was only 89%, she was treated with supplemental oxygen which was started and 11:55 PM. Based on her sleep-related complaints and her test results I invited her for a full night CPAP titration study.    She had her CPAP study on 06/05/2016. Sleep efficiency was 75.9%, sleep latency was 26 minutes and wake after sleep onset was 71.5 minutes. She had an increased percentage of slow-wave sleep and REM sleep was 6.4%, REM latency markedly prolonged at 226.5 minutes. She had no significant PLMS. CPAP was titrated from 5 cm to 7 cm. AHI was 0 per hour at the final pressure. Average oxygen saturation was 92%, nadir was 87%. Time below 88% saturation was less than 1 minute. Based on her test results are prescribed CPAP therapy for home use.   I reviewed her CPAP compliance data from 07/12/2016 through 08/10/2016 which is a total of 30 days, during which time she used her machine 29 days with percent used days greater than 4 hours at 93%, indicating excellent compliance with an average usage of 5 hours and 55 minutes, residual AHI 2.3 per hour, leak on the higher side with the 95th percentile at 22.4 L/m on a pressure of 7 cm with EPR of 3.   04/28/2016: She reports snoring, excessive  daytime somnolence, nonrestorative sleep, morning headaches and sleep disruption. I reviewed your office note from 04/15/2016. She is s/p R TKA in 2015 and has L knee pain, s/p multiple knee injection, sees Dr. Maureen Ralphs.  She goes to bed around 9:30 to 10 PM. She has occasional morning headaches, not enough to take medication, but BP has been a little up in the AMs. She denies restless leg symptoms. She has a rise time around 5 AM. She goes to the bathroom once or twice per average night. She lives alone. She is divorced for about 41 years. She is retired from [employer]. She has 2 grown children and 6 grandchildren. She likes to travel. She stays very active, walks about 2 miles per day. She drinks alcohol occasionally, usually 2 cups of coffee per day, quit smoking in 1980. She recently started Cymbalta 30 mg once daily about 2 weeks ago and feels improved in her mood.  She has no TV in the bedroom. She has a small dog sleeps at the end of the bed in a queen size bed. She is not aware of any family history of obstructive sleep apnea. Her main concern is that she is tired during the day and does not wake up rested. Her Epworth sleepiness score is 12 out of 24 today, her fatigue score is 33 out of 63.  Her Past Medical History Is Significant For: Past Medical History:  Diagnosis Date  . Complication of anesthesia    slow to awaken  . Depression   . Diverticulosis   . Goiter, nodular    [place] of thyroid stable 7/08  . Headache    hx of  . Heart murmur    due to rheumatic fever as child  . Mitral valve prolapse   . Multiple thyroid nodules 11/24/2015  . Neoplasm of lung, malignant ([facility]) 2002   Non-small cell surgical tx  . Osteoarthritis 11/14/2013   2010 intra-articular steroids X 3 S/P Physical Therapy in 12/14 TKR 12/11/13   . Osteopenia    Dr Pamala Hurry  . PONV (postoperative nausea and vomiting)    n/v  . Sleep apnea   . Vitamin D deficiency     Her Past Surgical History Is Significant  For: Past Surgical History:  Procedure Laterality Date  . ABDOMINAL HYSTERECTOMY  1998   No BSO, for dysfunctional menses  . BLADDER SUSPENSION Bilateral   . BREAST BIOPSY Right   . COLONOSCOPY  2009 , 2014   Diverticulosis; Dr. Olevia Perches  . LOBECTOMY  12/02   RU; no radiation or chemo, Dr. Arlyce Dice  . TONSILLECTOMY    . TOTAL KNEE ARTHROPLASTY Right 12/11/2013   Procedure: RIGHT TOTAL KNEE ARTHROPLASTY;  Surgeon: Gearlean Alf, MD;  Location: [facility];  Service: Orthopedics;  Laterality: Right;  . TOTAL KNEE ARTHROPLASTY Left 06/28/2017   Procedure: LEFT TOTAL KNEE ARTHROPLASTY;  Surgeon: Gaynelle Arabian, MD;  Location: [facility];  Service: Orthopedics;  Laterality: Left;  Adductor Block    Her Family History Is Significant For: Family History  Problem Relation Age of Onset  . COPD Mother   . Coronary artery disease Father   . Colon cancer Father 17  . Heart attack Father 42  . Stroke Father        > 4  . Lung cancer Paternal Aunt        smoker  . Cancer Paternal Aunt        lung  . Lung cancer Maternal Grandfather        smoker  . COPD Maternal Grandfather   . Arthritis Sister   . Lung cancer Maternal Uncle        smoker  . Diabetes Neg Hx     Her Social History Is Significant For: Social History   Socioeconomic History  . Marital status: Divorced    Spouse name: Not on file  . Number of children: Not on file  . Years of education: college  . Highest education level: Not on file  Occupational History  . Occupation: Retired    Fish farm manager: RETIRED  Social Needs  . Financial resource strain: Not on file  . Food insecurity:    Worry: Not on file    Inability: Not on file  . Transportation needs:    Medical: Not on file    Non-medical: Not on file  Tobacco Use  . Smoking status: Former Smoker    Years: 20.00  Types: Cigarettes    Last attempt to quit: 05/03/1979    Years since quitting: 39.9  . Smokeless tobacco: Never Used  . Tobacco comment: smoked 1962-1980, up  to < 1 ppd  Substance and Sexual Activity  . Alcohol use: Yes    Alcohol/week: 7.0 standard drinks    Types: 7 Standard drinks or equivalent per week    Comment: occasional  . Drug use: No  . Sexual activity: Never    Comment: lives alone, dog, no dietary restrictions, eating heart healthy  Lifestyle  . Physical activity:    Days per week: Not on file    Minutes per session: Not on file  . Stress: Not on file  Relationships  . Social connections:    Talks on phone: Not on file    Gets together: Not on file    Attends religious service: Not on file    Active member of club or organization: Not on file    Attends meetings of clubs or organizations: Not on file    Relationship status: Not on file  Other Topics Concern  . Not on file  Social History Narrative   Pt gets regular exercise.   Drinks 2 cups of coffee a day     Her Allergies Are:  Allergies  Allergen Reactions  . Morphine And Related     Mental status change  . Oxycodone     Mental status changes post op 12/11/13  . Sulfonamide Derivatives     REACTION: stomach pain  :   Her Current Medications Are:  Outpatient Encounter Medications as of 03/21/2019  Medication Sig  . cholecalciferol (VITAMIN D) 1000 units tablet Take 1,000 Units by mouth daily.  . DULoxetine (CYMBALTA) 30 MG capsule TAKE 1 CAPSULE BY MOUTH EVERY DAY  . folic acid (FOLVITE) 1 MG tablet Take 1 mg by mouth daily.  . polyethylene glycol powder (MIRALAX) powder Take 0.5 Containers by mouth every other day.  . traMADol (ULTRAM) 50 MG tablet tramadol 50 mg tablet  TAKE 1 TABLET BY MOUTH TWICE A DAY AS NEEDED  . vitamin B-12 (CYANOCOBALAMIN) 500 MCG tablet Take 500 mcg by mouth daily.  . [DISCONTINUED] mirabegron ER (MYRBETRIQ) 50 MG TB24 tablet Myrbetriq 50 mg tablet,extended release   No facility-administered encounter medications on file as of 03/21/2019.   :  Review of Systems:  Out of a complete 14 point review of systems, all are reviewed and  negative with the exception of these symptoms as listed below: Review of Systems  Neurological:       Pt presents today to discuss her cpap. She reports that her cpap is going well.    Objective:  Neurological Exam  Physical Exam Physical Examination:   Vitals:   03/21/19 1033  BP: (!) 150/92  Pulse: 84  Temp: (!) 96.9 F (36.1 C)   General Examination: The patient is a very pleasant 83 y.o. female in no acute distress. She appears well-developed and well-nourished and well groomed.   HEENT:Normocephalic, atraumatic, pupils are equal, round and reactive to light and accommodation. She is status post cataract repairs, she has corrective eyeglasses in place, extraocular tracking is good, hearing is grossly intact. Face is symmetric with normal facial animation and normal facial sensation. Speech is clear with no dysarthria noted. There is no hypophonia. There is no lip, neck/head, jaw or voice tremor. Neck with FROM. No carotid bruits. Oropharynx exam (we removed her mask briefly) reveals:no signif. mouth dryness, adequatedental hygiene and moderateairway  crowding. Mallampati is class II. Tongue protrudes centrally and palate elevates symmetrically. Tonsils are absent.   Chest:Clear to auscultation without wheezing, rhonchi or crackles noted.  Heart:S1+S2+0, regular and normal without murmurs, rubs or gallops noted.   Abdomen:Soft, non-tender and non-distended with normal bowel sounds appreciated on auscultation.  Extremities:There isnopitting edema in the distal lower extremities bilaterally.   Skin: Warm and dry without trophic changes noted.  Musculoskeletal: exam reveals no obvious joint deformities, tenderness or joint swelling or erythema,status post b/l knee replacement surgeries, mildly larger L knee.   Neurologically:  Mental status: The patient is awake, alert and oriented in all 4 spheres.Herimmediate and remote memory, attention, language skills and  fund of knowledge are appropriate. There is no evidence of aphasia, agnosia, apraxia or anomia. Speech is clear with normal prosody and enunciation. Thought process is linear. Mood is normaland affect is normal.  Cranial nerves II - XII are as described above under HEENT exam. In addition: shoulder shrug is normal with equal shoulder height noted. Motor exam: Normal bulk, strength and tone is noted. There is no tremor. Fine motor skills and coordination: grossly intact.  Cerebellar testing: No dysmetria or intention tremor. There is no truncal or gait ataxia.  Sensory exam: intact to light touch in the upper and lower extremities.  Gait, station and balance:Shestands with no significant difficulty.Posture is stooped, she has lumbar kyphosisand probably some scoliosis, stable appearing. She walks with a limp on the R, has no cane. Tandem walk is not possible, Romberg was not tested for safety.   Assessmentand Plan:   In summary,Iria B Aldridgeis a very pleasant 41 year oldfemalewith an underlying medical history non-small cell lung cancer (s/p lobectomy in 2002), hypertension, depression, thyroid nodules, arthritisof both knees,status post right total knee arthroplasty, hyponatremia, hypokalemia, hyperlipidemia, vitamin D deficiency, osteoporosis, and overweight state, who presents for follow-up consultation of her sleep apnea for her yearly checkup, well established on CPAP therapy at a pressure of 7 cm. She is tolerating the treatment, she feels well with regards to her sleep. She continues to be compliant with it. She is commended for her CPAP adherence. Of note, her sleep study from 05/18/2016 showed moderate to severe obstructive sleep apnea. She is up-to-date with his supplies and motivated to continue with treatment, has benefited from sleep apnea treatment and reports sleeping well with it.  I would like for her to come back routinely in one year. I answered all her  questions today and she was in agreement. I spent 15 minutes in total face-to-face time with the patient, more than 50% of which was spent in counseling and coordination of care, reviewing test results, reviewing medication and discussing or reviewing the diagnosis of OSA, its prognosis and treatment options. Pertinent laboratory and imaging test results that were available during this visit with the patient were reviewed by me and considered in my medical decision making (see chart for details).

## 2019-03-21 NOTE — Patient Instructions (Signed)
Keep up the good work! I will see you in one year for sleep apnea check up.  It's always good to see you, stay well!

## 2019-03-23 ENCOUNTER — Encounter: Payer: Medicare Other | Admitting: Physical Therapy

## 2019-03-23 ENCOUNTER — Ambulatory Visit: Payer: Medicare Other | Admitting: Physical Therapy

## 2019-03-23 NOTE — Progress Notes (Addendum)
Virtual Visit via Video Note  I connected with patient on 03/24/19 at  9:00 AM EDT by a video enabled telemedicine application and verified that I am speaking with the correct person using two identifiers.   THIS ENCOUNTER IS A VIRTUAL VISIT DUE TO COVID-19 - PATIENT WAS NOT SEEN IN THE OFFICE. PATIENT HAS CONSENTED TO VIRTUAL VISIT / TELEMEDICINE VISIT   Location of patient: home  Location of provider: office  I discussed the limitations of evaluation and management by telemedicine and the availability of in person appointments. The patient expressed understanding and agreed to proceed.   Subjective:   Maria [name] is a 83 y.o. female who presents for Medicare Annual (Subsequent) preventive examination.  Enjoys reading and watching PBS. Pt is very social. Enjoys having wine with her friends by the fountain in complex.   Review of Systems: No ROS.  Medicare Wellness Virtual Visit.  Visual/audio telehealth visit, UTA vital signs.   See social history for additional risk factors. Cardiac Risk Factors include: advanced age (>89men, >31 women) Sleep patterns: wears CPAP. Sleeps well. Home Safety/Smoke Alarms: Feels safe in home. Smoke alarms in place.  Lives alone with dog in first floor condo.    Female:        Mammo- 12/12/18       Dexa scan- ordered     Objective:     Advanced Directives 03/24/2019 03/22/2018 06/28/2017 06/17/2017 06/16/2016 05/14/2016 12/11/2013  Does Patient Have a Medical Advance Directive? Yes Yes Yes Yes Yes Yes Patient has advance directive, copy not in chart  Type of Advance Directive [place];Living will [place];Living will Living will;Healthcare Power of Attorney Living will;Healthcare Power of [place];Living will [place];Living will [place];Living will  Does patient want to make changes to medical advance directive? No - Patient declined No -  Patient declined No - Patient declined No - Patient declined - - No  Copy of Healthcare Power of Attorney in Chart? Yes - validated most recent copy scanned in chart (See row information) Yes No - copy requested No - copy requested - No - copy requested Copy requested from family  Pre-existing out of facility DNR order (yellow form or pink MOST form) - - - - - - No    Tobacco Social History   Tobacco Use  Smoking Status Former Smoker  . Years: 20.00  . Types: Cigarettes  . Last attempt to quit: 05/03/1979  . Years since quitting: 39.9  Smokeless Tobacco Never Used  Tobacco Comment   smoked 1962-1980, up to < 1 ppd     Counseling given: Not Answered Comment: smoked 1962-1980, up to < 1 ppd   Clinical Intake: Pain : No/denies pain   Past Medical History:  Diagnosis Date  . Complication of anesthesia    slow to awaken  . Depression   . Diverticulosis   . Goiter, nodular    [place] of thyroid stable 7/08  . Headache    hx of  . Heart murmur    due to rheumatic fever as child  . Mitral valve prolapse   . Multiple thyroid nodules 11/24/2015  . Neoplasm of lung, malignant ([place]) 2002   Non-small cell surgical tx  . Osteoarthritis 11/14/2013   2010 intra-articular steroids X 3 S/P Physical Therapy in 12/14 TKR 12/11/13   . Osteopenia    Dr Pamala Hurry  . PONV (postoperative nausea and vomiting)    n/v  .  Sleep apnea   . Vitamin D deficiency    Past Surgical History:  Procedure Laterality Date  . ABDOMINAL HYSTERECTOMY  1998   No BSO, for dysfunctional menses  . BLADDER SUSPENSION Bilateral   . BREAST BIOPSY Right   . COLONOSCOPY  2009 , 2014   Diverticulosis; Dr. Olevia Perches  . LOBECTOMY  12/02   RU; no radiation or chemo, Dr. Arlyce Dice  . TONSILLECTOMY    . TOTAL KNEE ARTHROPLASTY Right 12/11/2013   Procedure: RIGHT TOTAL KNEE ARTHROPLASTY;  Surgeon: Gearlean Alf, MD;  Location: [facility];  Service: Orthopedics;  Laterality: Right;  . TOTAL KNEE ARTHROPLASTY Left 06/28/2017    Procedure: LEFT TOTAL KNEE ARTHROPLASTY;  Surgeon: Gaynelle Arabian, MD;  Location: [facility];  Service: Orthopedics;  Laterality: Left;  Adductor Block   Family History  Problem Relation Age of Onset  . COPD Mother   . Coronary artery disease Father   . Colon cancer Father 45  . Heart attack Father 7  . Stroke Father        > 62  . Lung cancer Paternal Aunt        smoker  . Cancer Paternal Aunt        lung  . Lung cancer Maternal Grandfather        smoker  . COPD Maternal Grandfather   . Arthritis Sister   . Lung cancer Maternal Uncle        smoker  . Diabetes Neg Hx    Social History   Socioeconomic History  . Marital status: Divorced    Spouse name: Not on file  . Number of children: Not on file  . Years of education: college  . Highest education level: Not on file  Occupational History  . Occupation: Retired    Fish farm manager: RETIRED  Social Needs  . Financial resource strain: Not on file  . Food insecurity:    Worry: Not on file    Inability: Not on file  . Transportation needs:    Medical: Not on file    Non-medical: Not on file  Tobacco Use  . Smoking status: Former Smoker    Years: 20.00    Types: Cigarettes    Last attempt to quit: 05/03/1979    Years since quitting: 39.9  . Smokeless tobacco: Never Used  . Tobacco comment: smoked 1962-1980, up to < 1 ppd  Substance and Sexual Activity  . Alcohol use: Yes    Alcohol/week: 7.0 standard drinks    Types: 7 Standard drinks or equivalent per week    Comment: occasional  . Drug use: No  . Sexual activity: Never    Comment: lives alone, dog, no dietary restrictions, eating heart healthy  Lifestyle  . Physical activity:    Days per week: Not on file    Minutes per session: Not on file  . Stress: Not on file  Relationships  . Social connections:    Talks on phone: Not on file    Gets together: Not on file    Attends religious service: Not on file    Active member of club or organization: Not on file    Attends  meetings of clubs or organizations: Not on file    Relationship status: Not on file  Other Topics Concern  . Not on file  Social History Narrative   Pt gets regular exercise.   Drinks 2 cups of coffee a day     Outpatient Encounter Medications as of 03/24/2019  Medication  Sig  . cholecalciferol (VITAMIN D) 1000 units tablet Take 1,000 Units by mouth daily.  . DULoxetine (CYMBALTA) 30 MG capsule TAKE 1 CAPSULE BY MOUTH EVERY DAY  . folic acid (FOLVITE) 1 MG tablet Take 1 mg by mouth daily.  . polyethylene glycol powder (MIRALAX) powder Take 0.5 Containers by mouth every other day.  . traMADol (ULTRAM) 50 MG tablet tramadol 50 mg tablet  TAKE 1 TABLET BY MOUTH TWICE A DAY AS NEEDED  . vitamin B-12 (CYANOCOBALAMIN) 500 MCG tablet Take 500 mcg by mouth daily.   No facility-administered encounter medications on file as of 03/24/2019.     Activities of Daily Living In your present state of health, do you have any difficulty performing the following activities: 03/24/2019  Hearing? N  Vision? N  Difficulty concentrating or making decisions? N  Walking or climbing stairs? N  Dressing or bathing? N  Doing errands, shopping? N  Preparing Food and eating ? N  Using the Toilet? N  In the past six months, have you accidently leaked urine? N  Do you have problems with loss of bowel control? N  Managing your Medications? N  Managing your Finances? N  Housekeeping or managing your Housekeeping? N  Some recent data might be hidden    Patient Care Team: Mosie Lukes, MD as PCP - General (Family Medicine) Gaynelle Arabian, MD as Consulting Physician (Orthopedic Surgery) Crista Luria, MD as Consulting Physician (Dermatology) Aloha Gell, MD as Consulting Physician (Obstetrics and Gynecology) Star Age, MD as Consulting Physician (Neurology)    Assessment:   This is a routine wellness examination for [place]. Physical assessment deferred to PCP.  Exercise Activities and Dietary  recommendations Current Exercise Habits: Home exercise routine, Type of exercise: walking, Time (Minutes): 45, Frequency (Times/Week): 7, Weekly Exercise (Minutes/Week): 315, Intensity: Mild, Exercise limited by: None identified   Diet (meal preparation, eat out, water intake, caffeinated beverages, dairy products, fruits and vegetables): in general, a "healthy" diet  , well balanced, on average, 3 meals per day Breakfast: healthy cereal with fruit. Lunch: salad Dinner: salmon, asparagus, potato. Drinks a lot of water.  Goals    . maintain healthy weight and healthy active lifestyle and independence.     . Patient stated     Decrease back pain through improved posture, strength, and alignment through exercise, chiropractor, and maybe starting acupuncture.       Fall Risk Fall Risk  03/24/2019 03/16/2018 11/16/2016 05/14/2016 04/15/2016  Falls in the past year? 0 No No Yes No  Number falls in past yr: - - - 1 -  Injury with Fall? - - - No -   Depression Screen PHQ 2/9 Scores 03/24/2019 03/22/2018 11/16/2016 05/14/2016  PHQ - 2 Score 0 0 0 1  PHQ- 9 Score - - - -     Cognitive Function Ad8 score reviewed for issues:  Issues making decisions:no  Less interest in hobbies / activities:no  Repeats questions, stories (family complaining):no  Trouble using ordinary gadgets (microwave, computer, phone):no  Forgets the month or year: no  Mismanaging finances: no  Remembering appts:no  Daily problems with thinking and/or memory:no Ad8 score is=0    MMSE - Mini Mental State Exam 05/14/2016  Not completed: Refused        Immunization History  Administered Date(s) Administered  . Pneumococcal Conjugate-13 11/15/2015  . Pneumococcal Polysaccharide-23 11/21/2013  . Tdap 11/14/2013  . Zoster 07/17/2014    Screening Tests Health Maintenance  Topic Date Due  . COLONOSCOPY  05/12/2018  . INFLUENZA VACCINE  06/03/2019  . TETANUS/TDAP  11/15/2023  . DEXA SCAN  Completed  . PNA  vac Low Risk Adult  Completed      Plan:     See you next year!   Keep up the great work!  I have ordered your bone density scan!  I have personally reviewed and noted the following in the patient's chart:   . Medical and social history . Use of alcohol, tobacco or illicit drugs  . Current medications and supplements . Functional ability and status . Nutritional status . Physical activity . Advanced directives . List of other physicians . Hospitalizations, surgeries, and ER visits in previous 12 months . Vitals . Screenings to include cognitive, depression, and falls . Referrals and appointments  In addition, I have reviewed and discussed with patient certain preventive protocols, quality metrics, and best practice recommendations. A written personalized care plan for preventive services as well as general preventive health recommendations were provided to patient.     Shela Nevin, [place]  03/24/2019   Medical screening examination/treatment was performed by qualified clinical staff member and as supervising physician I was immediately available for consultation/collaboration. I have reviewed documentation and agree with assessment and plan.  Penni Homans, MD

## 2019-03-24 ENCOUNTER — Other Ambulatory Visit: Payer: Self-pay

## 2019-03-24 ENCOUNTER — Encounter: Payer: Self-pay | Admitting: *Deleted

## 2019-03-24 ENCOUNTER — Ambulatory Visit (INDEPENDENT_AMBULATORY_CARE_PROVIDER_SITE_OTHER): Payer: Medicare Other | Admitting: Family Medicine

## 2019-03-24 ENCOUNTER — Ambulatory Visit (INDEPENDENT_AMBULATORY_CARE_PROVIDER_SITE_OTHER): Payer: Medicare Other | Admitting: *Deleted

## 2019-03-24 DIAGNOSIS — E559 Vitamin D deficiency, unspecified: Secondary | ICD-10-CM

## 2019-03-24 DIAGNOSIS — Z0000 Encounter for general adult medical examination without abnormal findings: Secondary | ICD-10-CM | POA: Diagnosis not present

## 2019-03-24 DIAGNOSIS — M2550 Pain in unspecified joint: Secondary | ICD-10-CM | POA: Diagnosis not present

## 2019-03-24 DIAGNOSIS — Z7189 Other specified counseling: Secondary | ICD-10-CM

## 2019-03-24 DIAGNOSIS — E782 Mixed hyperlipidemia: Secondary | ICD-10-CM

## 2019-03-24 DIAGNOSIS — Z1231 Encounter for screening mammogram for malignant neoplasm of breast: Secondary | ICD-10-CM

## 2019-03-24 MED ORDER — TRAMADOL HCL 50 MG PO TABS: ORAL_TABLET | ORAL | 0 refills | Status: DC

## 2019-03-24 NOTE — Patient Instructions (Signed)
See you next year!   Keep up the great work!  I have ordered your bone density scan!   Maria [name] , Thank you for taking time to come for your Medicare Wellness Visit. I appreciate your ongoing commitment to your health goals. Please review the following plan we discussed and let me know if I can assist you in the future.   These are the goals we discussed: Goals    . maintain healthy weight and healthy active lifestyle and independence.     . Patient stated     Decrease back pain through improved posture, strength, and alignment through exercise, chiropractor, and maybe starting acupuncture.       This is a list of the screening recommended for you and due dates:  Health Maintenance  Topic Date Due  . Colon Cancer Screening  05/12/2018  . Flu Shot  06/03/2019  . Tetanus Vaccine  11/15/2023  . DEXA scan (bone density measurement)  Completed  . Pneumonia vaccines  Completed    Health Maintenance After Age 59 After age 28, you are at a higher risk for certain long-term diseases and infections as well as injuries from falls. Falls are a major cause of broken bones and head injuries in people who are older than age 80. Getting regular preventive care can help to keep you healthy and well. Preventive care includes getting regular testing and making lifestyle changes as recommended by your health care provider. Talk with your health care provider about:  Which screenings and tests you should have. A screening is a test that checks for a disease when you have no symptoms.  A diet and exercise plan that is right for you. What should I know about screenings and tests to prevent falls? Screening and testing are the best ways to find a health problem early. Early diagnosis and treatment give you the best chance of managing medical conditions that are common after age 48. Certain conditions and lifestyle choices may make you more likely to have a fall. Your health care provider may  recommend:  Regular vision checks. Poor vision and conditions such as cataracts can make you more likely to have a fall. If you wear glasses, make sure to get your prescription updated if your vision changes.  Medicine review. Work with your health care provider to regularly review all of the medicines you are taking, including over-the-counter medicines. Ask your health care provider about any side effects that may make you more likely to have a fall. Tell your health care provider if any medicines that you take make you feel dizzy or sleepy.  Osteoporosis screening. Osteoporosis is a condition that causes the bones to get weaker. This can make the bones weak and cause them to break more easily.  Blood pressure screening. Blood pressure changes and medicines to control blood pressure can make you feel dizzy.  Strength and balance checks. Your health care provider may recommend certain tests to check your strength and balance while standing, walking, or changing positions.  Foot health exam. Foot pain and numbness, as well as not wearing proper footwear, can make you more likely to have a fall.  Depression screening. You may be more likely to have a fall if you have a fear of falling, feel emotionally low, or feel unable to do activities that you used to do.  Alcohol use screening. Using too much alcohol can affect your balance and may make you more likely to have a fall. What actions can  I take to lower my risk of falls? General instructions  Talk with your health care provider about your risks for falling. Tell your health care provider if: ? You fall. Be sure to tell your health care provider about all falls, even ones that seem minor. ? You feel dizzy, sleepy, or off-balance.  Take over-the-counter and prescription medicines only as told by your health care provider. These include any supplements.  Eat a healthy diet and maintain a healthy weight. A healthy diet includes low-fat dairy  products, low-fat (lean) meats, and fiber from whole grains, beans, and lots of fruits and vegetables. Home safety  Remove any tripping hazards, such as rugs, cords, and clutter.  Install safety equipment such as grab bars in bathrooms and safety rails on stairs.  Keep rooms and walkways well-lit. Activity   Follow a regular exercise program to stay fit. This will help you maintain your balance. Ask your health care provider what types of exercise are appropriate for you.  If you need a cane or walker, use it as recommended by your health care provider.  Wear supportive shoes that have nonskid soles. Lifestyle  Do not drink alcohol if your health care provider tells you not to drink.  If you drink alcohol, limit how much you have: ? 0-1 drink a day for women. ? 0-2 drinks a day for men.  Be aware of how much alcohol is in your drink. In the [place] one drink equals one typical bottle of beer (12 oz), one-half glass of wine (5 oz), or one shot of hard liquor (1 oz).  Do not use any products that contain nicotine or tobacco, such as cigarettes and e-cigarettes. If you need help quitting, ask your health care provider. Summary  Having a healthy lifestyle and getting preventive care can help to protect your health and wellness after age 27.  Screening and testing are the best way to find a health problem early and help you avoid having a fall. Early diagnosis and treatment give you the best chance for managing medical conditions that are more common for people who are older than age 32.  Falls are a major cause of broken bones and head injuries in people who are older than age 75. Take precautions to prevent a fall at home.  Work with your health care provider to learn what changes you can make to improve your health and wellness and to prevent falls. This information is not intended to replace advice given to you by your health care provider. Make sure you discuss any questions you  have with your health care provider. Document Released: 09/01/2017 Document Revised: 09/01/2017 Document Reviewed: 09/01/2017 Elsevier Interactive Patient Education  2019 [employer].

## 2019-03-27 DIAGNOSIS — Z7189 Other specified counseling: Secondary | ICD-10-CM | POA: Insufficient documentation

## 2019-03-27 NOTE — Assessment & Plan Note (Signed)
encouraged to maintain quarantine, increased hand washing and cleaning and mask wearing. Add Zinc 50 mg daily and Vitamin C 500 mg bid, deep breathing exercises 5 deep breaths lasting 5 seconds each each hour.

## 2019-03-27 NOTE — Assessment & Plan Note (Signed)
Supplement and monitor 

## 2019-03-27 NOTE — Progress Notes (Signed)
[name] [place], [place] [address] Phone: [phone] Subjective:   I Maria [name] am serving as a Education administrator for Dr. Hulan Saas.  I'm seeing this patient by the request  of:    CC: low back pain   [id number]  Maria [name] is a 83 y.o. female coming in with complaint of low back pain. Has started PT again. Back is doing better.    Past medical history significant for malignant lung nodule.  Patient had x-rays done on January 02, 2019.  Independently visualized by me.  Severe degenerative changes that seems to be progressive.  L1-L2 L3-L4. Past Medical History:  Diagnosis Date  . Complication of anesthesia    slow to awaken  . Depression   . Diverticulosis   . Goiter, nodular    [place] of thyroid stable 7/08  . Headache    hx of  . Heart murmur    due to rheumatic fever as child  . Mitral valve prolapse   . Multiple thyroid nodules 11/24/2015  . Neoplasm of lung, malignant ([place]) 2002   Non-small cell surgical tx  . Osteoarthritis 11/14/2013   2010 intra-articular steroids X 3 S/P Physical Therapy in 12/14 TKR 12/11/13   . Osteopenia    Dr Pamala Hurry  . PONV (postoperative nausea and vomiting)    n/v  . Sleep apnea   . Vitamin D deficiency    Past Surgical History:  Procedure Laterality Date  . ABDOMINAL HYSTERECTOMY  1998   No BSO, for dysfunctional menses  . BLADDER SUSPENSION Bilateral   . BREAST BIOPSY Right   . COLONOSCOPY  2009 , 2014   Diverticulosis; Dr. Olevia Perches  . LOBECTOMY  12/02   RU; no radiation or chemo, Dr. Arlyce Dice  . TONSILLECTOMY    . TOTAL KNEE ARTHROPLASTY Right 12/11/2013   Procedure: RIGHT TOTAL KNEE ARTHROPLASTY;  Surgeon: Gearlean Alf, MD;  Location: [facility];  Service: Orthopedics;  Laterality: Right;  . TOTAL KNEE ARTHROPLASTY Left 06/28/2017   Procedure: LEFT TOTAL KNEE ARTHROPLASTY;  Surgeon: Gaynelle Arabian, MD;  Location: [facility];  Service: Orthopedics;  Laterality: Left;  Adductor Block    Social History   Socioeconomic History  . Marital status: Divorced    Spouse name: Not on file  . Number of children: Not on file  . Years of education: college  . Highest education level: Not on file  Occupational History  . Occupation: Retired    Fish farm manager: RETIRED  Social Needs  . Financial resource strain: Not on file  . Food insecurity:    Worry: Not on file    Inability: Not on file  . Transportation needs:    Medical: Not on file    Non-medical: Not on file  Tobacco Use  . Smoking status: Former Smoker    Years: 20.00    Types: Cigarettes    Last attempt to quit: 05/03/1979    Years since quitting: 39.9  . Smokeless tobacco: Never Used  . Tobacco comment: smoked 1962-1980, up to < 1 ppd  Substance and Sexual Activity  . Alcohol use: Yes    Alcohol/week: 7.0 standard drinks    Types: 7 Standard drinks or equivalent per week    Comment: occasional  . Drug use: No  . Sexual activity: Never    Comment: lives alone, dog, no dietary restrictions, eating heart healthy  Lifestyle  . Physical activity:    Days per week: Not on file  Minutes per session: Not on file  . Stress: Not on file  Relationships  . Social connections:    Talks on phone: Not on file    Gets together: Not on file    Attends religious service: Not on file    Active member of club or organization: Not on file    Attends meetings of clubs or organizations: Not on file    Relationship status: Not on file  Other Topics Concern  . Not on file  Social History Narrative   Pt gets regular exercise.   Drinks 2 cups of coffee a day    Allergies  Allergen Reactions  . Oxycodone     Mental status changes post op 12/11/13  . Sulfonamide Derivatives     REACTION: stomach pain   Family History  Problem Relation Age of Onset  . COPD Mother   . Coronary artery disease Father   . Colon cancer Father 64  . Heart attack Father 23  . Stroke Father        > 63  . Lung cancer Paternal Aunt        smoker   . Cancer Paternal Aunt        lung  . Lung cancer Maternal Grandfather        smoker  . COPD Maternal Grandfather   . Arthritis Sister   . Lung cancer Maternal Uncle        smoker  . Diabetes Neg Hx        Current Outpatient Medications (Analgesics):  .  traMADol (ULTRAM) 50 MG tablet, TAKE 1 TABLET BY MOUTH TWICE A DAY AS NEEDED  Current Outpatient Medications (Hematological):  .  folic acid (FOLVITE) 1 MG tablet, Take 1 mg by mouth daily. .  vitamin B-12 (CYANOCOBALAMIN) 500 MCG tablet, Take 500 mcg by mouth daily.  Current Outpatient Medications (Other):  .  cholecalciferol (VITAMIN D) 1000 units tablet, Take 1,000 Units by mouth daily. .  DULoxetine (CYMBALTA) 30 MG capsule, TAKE 1 CAPSULE BY MOUTH EVERY DAY .  polyethylene glycol powder (MIRALAX) powder, Take 0.5 Containers by mouth every other day.    Past medical history, social, surgical and family history all reviewed in electronic medical record.  No pertanent information unless stated regarding to the chief complaint.   Review of Systems:  No headache, visual changes, nausea, vomiting, diarrhea, constipation, dizziness, abdominal pain, skin rash, fevers, chills, night sweats, weight loss, swollen lymph nodes, body aches, joint swelling,  chest pain, shortness of breath, mood changes.  Positive muscle aches  Objective  Blood pressure (!) 140/92, pulse 93, height 5\' 6"  (1.676 m), weight 78.5 kg, SpO2 98 %.   General: No apparent distress alert and oriented x3 mood and affect normal, dressed appropriately.  HEENT: Pupils equal, extraocular movements intact  Respiratory: Patient's speak in full sentences and does not appear short of breath  Cardiovascular: Trace lower extremity edema, non tender, no erythema  Skin: Warm dry intact with no signs of infection or rash on extremities or on axial skeleton.  Abdomen: Soft nontender  Neuro: Cranial nerves II through XII are intact, neurovascularly intact in all extremities  with 2+ DTRs and 2+ pulses.  Lymph: No lymphadenopathy of posterior or anterior cervical chain or axillae bilaterally.  Gait severely antalgic favoring patient's right side.  Patient does walk in a flexed position with significant scoliosis of the lumbar spine. MSK: Severe arthritic changes of multiple joints. Back exam shows the patient is unable to straighten  it actually is stuck in 10 degrees of forward flexion.  Patient has tenderness to palpation more in the paraspinal musculature of the lumbar spine on the right side.  Degenerative scoliosis significantly noted.     Impression and Recommendations:     This case required medical decision making of moderate complexity. The above documentation has been reviewed and is accurate and complete Lyndal Pulley, DO       Note: This dictation was prepared with Dragon dictation along with smaller phrase technology. Any transcriptional errors that result from this process are unintentional.

## 2019-03-27 NOTE — Assessment & Plan Note (Signed)
encouarged to stay as active as able and keep walking regularly

## 2019-03-27 NOTE — Progress Notes (Signed)
Virtual Visit via Video Note  I connected with [name] on 03/27/19 at 10:00 AM EDT by a video enabled telemedicine application and verified that I am speaking with the correct person using two identifiers.  Location: Patient: home Provider: home   I discussed the limitations of evaluation and management by telemedicine and the availability of in person appointments. The patient expressed understanding and agreed to proceed. Magdalene Molly, CMA was able to get patient set up on video visit.     Subjective:    Patient ID: Maria [name], female    DOB: [birth year], 83 y.o.   MRN: [id number]  No chief complaint on file.   HPI Patient is in today for follow up on chronic medical concerns including vitamin D Deficiency, hyperlipidemia, arthritis and more. She feels well today and is maintaining quarantine well. No recent febrile illness or hospitalizations. Denies CP/palp/SOB/HA/congestion/fevers/GI or GU c/o. Taking meds as prescribed  Past Medical History:  Diagnosis Date  . Complication of anesthesia    slow to awaken  . Depression   . Diverticulosis   . Goiter, nodular    [place] of thyroid stable 7/08  . Headache    hx of  . Heart murmur    due to rheumatic fever as child  . Mitral valve prolapse   . Multiple thyroid nodules 11/24/2015  . Neoplasm of lung, malignant ([place]) 2002   Non-small cell surgical tx  . Osteoarthritis 11/14/2013   2010 intra-articular steroids X 3 S/P Physical Therapy in 12/14 TKR 12/11/13   . Osteopenia    Dr Pamala Hurry  . PONV (postoperative nausea and vomiting)    n/v  . Sleep apnea   . Vitamin D deficiency     Past Surgical History:  Procedure Laterality Date  . ABDOMINAL HYSTERECTOMY  1998   No BSO, for dysfunctional menses  . BLADDER SUSPENSION Bilateral   . BREAST BIOPSY Right   . COLONOSCOPY  2009 , 2014   Diverticulosis; Dr. Olevia Perches  . LOBECTOMY  12/02   RU; no radiation or chemo, Dr. Arlyce Dice  . TONSILLECTOMY    . TOTAL KNEE  ARTHROPLASTY Right 12/11/2013   Procedure: RIGHT TOTAL KNEE ARTHROPLASTY;  Surgeon: Gearlean Alf, MD;  Location: [facility];  Service: Orthopedics;  Laterality: Right;  . TOTAL KNEE ARTHROPLASTY Left 06/28/2017   Procedure: LEFT TOTAL KNEE ARTHROPLASTY;  Surgeon: Gaynelle Arabian, MD;  Location: [facility];  Service: Orthopedics;  Laterality: Left;  Adductor Block    Family History  Problem Relation Age of Onset  . COPD Mother   . Coronary artery disease Father   . Colon cancer Father 73  . Heart attack Father 57  . Stroke Father        > 37  . Lung cancer Paternal Aunt        smoker  . Cancer Paternal Aunt        lung  . Lung cancer Maternal Grandfather        smoker  . COPD Maternal Grandfather   . Arthritis Sister   . Lung cancer Maternal Uncle        smoker  . Diabetes Neg Hx     Social History   Socioeconomic History  . Marital status: Divorced    Spouse name: Not on file  . Number of children: Not on file  . Years of education: college  . Highest education level: Not on file  Occupational History  . Occupation: Retired    Fish farm manager: RETIRED  Social Needs  . Financial resource strain: Not on file  . Food insecurity:    Worry: Not on file    Inability: Not on file  . Transportation needs:    Medical: Not on file    Non-medical: Not on file  Tobacco Use  . Smoking status: Former Smoker    Years: 20.00    Types: Cigarettes    Last attempt to quit: 05/03/1979    Years since quitting: 39.9  . Smokeless tobacco: Never Used  . Tobacco comment: smoked 1962-1980, up to < 1 ppd  Substance and Sexual Activity  . Alcohol use: Yes    Alcohol/week: 7.0 standard drinks    Types: 7 Standard drinks or equivalent per week    Comment: occasional  . Drug use: No  . Sexual activity: Never    Comment: lives alone, dog, no dietary restrictions, eating heart healthy  Lifestyle  . Physical activity:    Days per week: Not on file    Minutes per session: Not on file  . Stress: Not on  file  Relationships  . Social connections:    Talks on phone: Not on file    Gets together: Not on file    Attends religious service: Not on file    Active member of club or organization: Not on file    Attends meetings of clubs or organizations: Not on file    Relationship status: Not on file  . Intimate partner violence:    Fear of current or ex partner: Not on file    Emotionally abused: Not on file    Physically abused: Not on file    Forced sexual activity: Not on file  Other Topics Concern  . Not on file  Social History Narrative   Pt gets regular exercise.   Drinks 2 cups of coffee a day     Outpatient Medications Prior to Visit  Medication Sig Dispense Refill  . cholecalciferol (VITAMIN D) 1000 units tablet Take 1,000 Units by mouth daily.    . DULoxetine (CYMBALTA) 30 MG capsule TAKE 1 CAPSULE BY MOUTH EVERY DAY 90 capsule 1  . folic acid (FOLVITE) 1 MG tablet Take 1 mg by mouth daily.    . polyethylene glycol powder (MIRALAX) powder Take 0.5 Containers by mouth every other day.    . vitamin B-12 (CYANOCOBALAMIN) 500 MCG tablet Take 500 mcg by mouth daily.    . traMADol (ULTRAM) 50 MG tablet tramadol 50 mg tablet  TAKE 1 TABLET BY MOUTH TWICE A DAY AS NEEDED     No facility-administered medications prior to visit.     Allergies  Allergen Reactions  . Oxycodone     Mental status changes post op 12/11/13  . Sulfonamide Derivatives     REACTION: stomach pain    Review of Systems  Constitutional: Negative for fever and malaise/fatigue.  HENT: Negative for congestion.   Eyes: Negative for blurred vision.  Respiratory: Negative for shortness of breath.   Cardiovascular: Negative for chest pain, palpitations and leg swelling.  Gastrointestinal: Negative for abdominal pain, blood in stool and nausea.  Genitourinary: Negative for dysuria and frequency.  Musculoskeletal: Negative for falls.  Skin: Negative for rash.  Neurological: Negative for dizziness, loss of  consciousness and headaches.  Endo/Heme/Allergies: Negative for environmental allergies.  Psychiatric/Behavioral: Negative for depression. The patient is not nervous/anxious.        Objective:    Physical Exam Constitutional:      Appearance: Normal appearance. She is  not ill-appearing.  HENT:     Head: Normocephalic and atraumatic.     Nose: Nose normal.  Eyes:     General:        Right eye: No discharge.        Left eye: No discharge.  Pulmonary:     Effort: Pulmonary effort is normal.  Neurological:     Mental Status: She is alert and oriented to person, place, and time.  Psychiatric:        Mood and Affect: Mood normal.        Behavior: Behavior normal.     There were no vitals taken for this visit. Wt Readings from Last 3 Encounters:  03/21/19 175 lb (79.4 kg)  01/02/19 171 lb 9.6 oz (77.8 kg)  07/08/18 165 lb 12.8 oz (75.2 kg)    Diabetic Foot Exam - Simple   No data filed     Lab Results  Component Value Date   WBC 6.7 07/08/2018   HGB 15.3 (H) 07/08/2018   HCT 44.9 07/08/2018   PLT 274.0 07/08/2018   GLUCOSE 88 07/08/2018   CHOL 197 07/08/2018   TRIG 174.0 (H) 07/08/2018   HDL 66.20 07/08/2018   LDLDIRECT 137.2 10/23/2008   LDLCALC 96 07/08/2018   ALT 16 07/08/2018   AST 22 07/08/2018   NA 140 07/08/2018   K 4.3 07/08/2018   CL 99 07/08/2018   CREATININE 0.70 07/08/2018   BUN 17 07/08/2018   CO2 34 (H) 07/08/2018   TSH 2.23 07/08/2018   INR 0.95 06/17/2017   HGBA1C 5.6 02/13/2014    Lab Results  Component Value Date   TSH 2.23 07/08/2018   Lab Results  Component Value Date   WBC 6.7 07/08/2018   HGB 15.3 (H) 07/08/2018   HCT 44.9 07/08/2018   MCV 91.9 07/08/2018   PLT 274.0 07/08/2018   Lab Results  Component Value Date   NA 140 07/08/2018   K 4.3 07/08/2018   CO2 34 (H) 07/08/2018   GLUCOSE 88 07/08/2018   BUN 17 07/08/2018   CREATININE 0.70 07/08/2018   BILITOT 0.7 07/08/2018   ALKPHOS 98 07/08/2018   AST 22 07/08/2018    ALT 16 07/08/2018   PROT 6.9 07/08/2018   ALBUMIN 4.4 07/08/2018   CALCIUM 9.8 07/08/2018   ANIONGAP 8 06/30/2017   GFR 85.45 07/08/2018   Lab Results  Component Value Date   CHOL 197 07/08/2018   Lab Results  Component Value Date   HDL 66.20 07/08/2018   Lab Results  Component Value Date   LDLCALC 96 07/08/2018   Lab Results  Component Value Date   TRIG 174.0 (H) 07/08/2018   Lab Results  Component Value Date   CHOLHDL 3 07/08/2018   Lab Results  Component Value Date   HGBA1C 5.6 02/13/2014       Assessment & Plan:   Problem List Items Addressed This Visit    Vitamin D deficiency    Supplement and monitor      Hyperlipidemia, mixed    Encouraged heart healthy diet, increase exercise, avoid trans fats, consider a krill oil cap daily      POLYARTHRALGIA    encouarged to stay as active as able and keep walking regularly      Educated About Covid-19 Virus Infection    encouraged to maintain quarantine, increased hand washing and cleaning and mask wearing. Add Zinc 50 mg daily and Vitamin C 500 mg bid, deep breathing exercises 5 deep breaths  lasting 5 seconds each each hour.          I have changed Hoyle Sauer B. Mandala's traMADol. I am also having her maintain her cholecalciferol, folic acid, vitamin V-40, polyethylene glycol powder, and DULoxetine.  Meds ordered this encounter  Medications  . traMADol (ULTRAM) 50 MG tablet    Sig: TAKE 1 TABLET BY MOUTH TWICE A DAY AS NEEDED    Dispense:  30 tablet    Refill:  0    I discussed the assessment and treatment plan with the patient. The patient was provided an opportunity to ask questions and all were answered. The patient agreed with the plan and demonstrated an understanding of the instructions.   The patient was advised to call back or seek an in-person evaluation if the symptoms worsen or if the condition fails to improve as anticipated.  I provided 25 minutes of non-face-to-face time during this  encounter.   Penni Homans, MD

## 2019-03-27 NOTE — Assessment & Plan Note (Signed)
Encouraged heart healthy diet, increase exercise, avoid trans fats, consider a krill oil cap daily 

## 2019-03-28 ENCOUNTER — Encounter: Payer: Self-pay | Admitting: Family Medicine

## 2019-03-28 ENCOUNTER — Ambulatory Visit: Payer: Medicare Other | Admitting: Sports Medicine

## 2019-03-28 ENCOUNTER — Ambulatory Visit: Payer: Medicare Other | Admitting: Physical Therapy

## 2019-03-28 ENCOUNTER — Ambulatory Visit: Payer: Medicare Other | Admitting: Family Medicine

## 2019-03-28 ENCOUNTER — Other Ambulatory Visit: Payer: Self-pay

## 2019-03-28 ENCOUNTER — Encounter: Payer: Self-pay | Admitting: Physical Therapy

## 2019-03-28 DIAGNOSIS — M6281 Muscle weakness (generalized): Secondary | ICD-10-CM

## 2019-03-28 DIAGNOSIS — G8929 Other chronic pain: Secondary | ICD-10-CM

## 2019-03-28 DIAGNOSIS — M47896 Other spondylosis, lumbar region: Secondary | ICD-10-CM | POA: Diagnosis not present

## 2019-03-28 DIAGNOSIS — R293 Abnormal posture: Secondary | ICD-10-CM

## 2019-03-28 NOTE — Assessment & Plan Note (Signed)
Moderate to severe overall.  Patient does have degenerative scoliosis.  Responding fairly good to physical therapy.  Patient is doing fairly well with the pain control for him patient takes an intermittent tramadol that is given to her by her primary care provider.  We discussed taking the Tylenol with this.  We discussed short courses of anti-inflammatories but try to avoid constant use.  We discussed the possibility of advanced imaging and epidurals if continuing to have pain.  Patient is in agreement with the plan.  We will follow-up with me again in 3 months.  Spent  25 minutes with patient face-to-face and had greater than 50% of counseling including as described above in assessment and plan.

## 2019-03-28 NOTE — Patient Instructions (Addendum)
Good to see you  Ice is your friend Ice 20 minutes 2 times daily. Usually after activity and before bed. When taking the tramadol take with a tylenol  Vitamin D 2000 IU daily  Tart cherry extract any dose at night (get pill form) Stay active See you again in 3 months or call us when needed

## 2019-03-28 NOTE — Therapy (Signed)
[name], [place], [address] Phone: [phone]   Fax:  [phone]  Physical Therapy Treatment  Patient Details  Name: Maria [name] MRN: [id number] Date of Birth: 03/08/1936 Referring Provider (PT): Dr Teresa Coombs, Dr. Hulan Saas   Encounter Date: 03/28/2019  PT End of Session - 03/28/19 1508    Visit Number  5    Number of Visits  12    Date for PT Re-Evaluation  04/24/19    PT Start Time  1503    PT Stop Time  1543    PT Time Calculation (min)  40 min    Activity Tolerance  Patient tolerated treatment well    Behavior During Therapy  [facility] for tasks assessed/performed       Past Medical History:  Diagnosis Date  . Complication of anesthesia    slow to awaken  . Depression   . Diverticulosis   . Goiter, nodular    [place] of thyroid stable 7/08  . Headache    hx of  . Heart murmur    due to rheumatic fever as child  . Mitral valve prolapse   . Multiple thyroid nodules 11/24/2015  . Neoplasm of lung, malignant ([place]) 2002   Non-small cell surgical tx  . Osteoarthritis 11/14/2013   2010 intra-articular steroids X 3 S/P Physical Therapy in 12/14 TKR 12/11/13   . Osteopenia    Dr Pamala Hurry  . PONV (postoperative nausea and vomiting)    n/v  . Sleep apnea   . Vitamin D deficiency     Past Surgical History:  Procedure Laterality Date  . ABDOMINAL HYSTERECTOMY  1998   No BSO, for dysfunctional menses  . BLADDER SUSPENSION Bilateral   . BREAST BIOPSY Right   . COLONOSCOPY  2009 , 2014   Diverticulosis; Dr. Olevia Perches  . LOBECTOMY  12/02   RU; no radiation or chemo, Dr. Arlyce Dice  . TONSILLECTOMY    . TOTAL KNEE ARTHROPLASTY Right 12/11/2013   Procedure: RIGHT TOTAL KNEE ARTHROPLASTY;  Surgeon: Gearlean Alf, MD;  [facility]: [facility];  Service: Orthopedics;  Laterality: Right;  . TOTAL KNEE ARTHROPLASTY Left 06/28/2017   Procedure: LEFT TOTAL KNEE ARTHROPLASTY;  Surgeon: Gaynelle Arabian, MD;   [facility];  Service: Orthopedics;  Laterality: Left;  Adductor Block    There were no vitals filed for this visit.  Subjective Assessment - 03/28/19 1504    Subjective  Just saw Dr.  Tamala Julian today as well as had eyes dilated.  No pain at the moment. Walked this morning.     Currently in Pain?  No/denies       [facility] Adult PT Treatment/Exercise - 03/28/19 0001      Knee/Hip Exercises: Aerobic   Other Aerobic  UBE for 6 min L1 3 min each direction for posture       Knee/Hip Exercises: Standing   Hip Abduction  Stengthening;Both;1 set;10 reps    Lateral Step Up  Both;1 set;10 reps    Lateral Step Up Limitations  4 inch step , needs 1 hand to assist     Wall Squat  10 reps    Wall Squat Limitations  horizontal pull red band x 10     Other Standing Knee Exercises  practiced dead lifting and hip hinging with various props     Other Standing Knee Exercises  step up and over 4 inch x 10 each leg 1 UE assist  Knee/Hip Exercises: Supine   Bridges  Strengthening;Both;1 set;10 reps    Bridges Limitations  1 set with iso hold and UE horizontal abduction redband x 10           PT Long Term Goals - 03/28/19 1531      PT LONG TERM GOAL #1   Title  I with advanced HEP    Status  On-going      PT LONG TERM GOAL #2   Title  improve FOTO =/< 44% limited     Status  On-going      PT LONG TERM GOAL #3   Title  report =/> 50% reduction of pain after taking her dog on her 2 mile walk (pain 3/10-4/10)    Baseline  some days 6/10, does not sit and rest.      Status  On-going      PT LONG TERM GOAL #4   Title  improve bilat hip abduction strength =/> 4+/5 to allow for no obvious trendelenburg with gait ( 02/23/2019)     Status  On-going            Plan - 03/28/19 1508    Clinical Impression Statement  Patient did well, worked on standing posture aand balance.  Unable to perform proper hip hinge but can wall squat without pain. She is aware of small improvements in her overall  posture when walking.  Still limited and walks through pain levels of moderate.      PT Treatment/Interventions  Balance training;Neuromuscular re-education;Passive range of motion;Dry needling;Manual techniques;Ultrasound;Cryotherapy;Electrical Stimulation;Moist Heat;Therapeutic exercise;Therapeutic activities;Patient/family education;Taping    PT Next Visit Plan  progress hip strengthening and balance    PT Home Exercise Plan  clam, reverse clam, standing ext, baby cobra, hamstring stretch, clam with bands, bridge        Patient will benefit from skilled therapeutic intervention in order to improve the following deficits and impairments:  Abnormal gait, Pain, Postural dysfunction, Increased muscle spasms, Decreased strength, Difficulty walking  Visit Diagnosis: Muscle weakness (generalized)  Abnormal posture  Chronic bilateral low back pain without sciatica     Problem List Patient Active Problem List   Diagnosis Date Noted  . Educated About Covid-19 Virus Infection 03/27/2019  . History of total knee replacement, left 11/04/2018  . Preventative health care 06/16/2016  . Knee pain 06/16/2016  . OSA (obstructive sleep apnea) 06/15/2016  . Depression 05/14/2016  . Multiple thyroid nodules 11/24/2015  . Degenerative arthritis of lumbar spine 09/03/2014  . Hyponatremia 12/12/2013  . Hypokalemia 12/12/2013  . OA (osteoarthritis) of knee 12/11/2013  . Osteoarthritis 11/14/2013  . BENIGN POSITIONAL VERTIGO 01/17/2010  . Urinary incontinence 04/26/2009  . Hyperlipidemia, mixed 02/04/2009  . Vitamin D deficiency 10/23/2008  . POLYARTHRALGIA 10/23/2008  . Osteoporosis 10/23/2008  . NEOPLASM, MALIGNANT, LUNG, NON-SMALL CELL 11/21/2007  . DIVERTICULOSIS 06/15/2007  . GOITER, NODULAR 04/14/2007    PAA,JENNIFER 03/28/2019, 4:31 PM  [facility] [address] [place], [place], [address] Phone: [phone]   Fax:   [phone]  Name: Maria [name] MRN: [id number] Date of Birth: 28-Sep-1936  Raeford Razor, PT 03/28/19 4:31 PM Phone: [phone] Fax: [phone]

## 2019-03-30 ENCOUNTER — Encounter: Payer: Self-pay | Admitting: Physical Therapy

## 2019-03-30 ENCOUNTER — Telehealth: Payer: Self-pay

## 2019-03-30 ENCOUNTER — Ambulatory Visit: Payer: Medicare Other | Admitting: Physical Therapy

## 2019-03-30 ENCOUNTER — Other Ambulatory Visit: Payer: Self-pay

## 2019-03-30 DIAGNOSIS — M6281 Muscle weakness (generalized): Secondary | ICD-10-CM

## 2019-03-30 DIAGNOSIS — G8929 Other chronic pain: Secondary | ICD-10-CM

## 2019-03-30 DIAGNOSIS — M545 Low back pain, unspecified: Secondary | ICD-10-CM

## 2019-03-30 DIAGNOSIS — R293 Abnormal posture: Secondary | ICD-10-CM

## 2019-03-30 NOTE — Telephone Encounter (Signed)
New referral sent to [facility].

## 2019-03-30 NOTE — Therapy (Signed)
[place], [place], [address] Phone: [phone]   Fax:  [phone]  Physical Therapy Treatment  Patient Details  Name: Maria [name] MRN: [id number] Date of Birth: 01-21-1936 Referring Provider (PT): Dr Teresa Coombs   Encounter Date: 03/30/2019  PT End of Session - 03/30/19 0851    Visit Number  6    Number of Visits  12    Date for PT Re-Evaluation  04/24/19    PT Start Time  0835    PT Stop Time  0916    PT Time Calculation (min)  41 min    Activity Tolerance  Patient tolerated treatment well    Behavior During Therapy  [facility] for tasks assessed/performed       Past Medical History:  Diagnosis Date  . Complication of anesthesia    slow to awaken  . Depression   . Diverticulosis   . Goiter, nodular    [place] of thyroid stable 7/08  . Headache    hx of  . Heart murmur    due to rheumatic fever as child  . Mitral valve prolapse   . Multiple thyroid nodules 11/24/2015  . Neoplasm of lung, malignant ([place]) 2002   Non-small cell surgical tx  . Osteoarthritis 11/14/2013   2010 intra-articular steroids X 3 S/P Physical Therapy in 12/14 TKR 12/11/13   . Osteopenia    Dr Pamala Hurry  . PONV (postoperative nausea and vomiting)    n/v  . Sleep apnea   . Vitamin D deficiency     Past Surgical History:  Procedure Laterality Date  . ABDOMINAL HYSTERECTOMY  1998   No BSO, for dysfunctional menses  . BLADDER SUSPENSION Bilateral   . BREAST BIOPSY Right   . COLONOSCOPY  2009 , 2014   Diverticulosis; Dr. Olevia Perches  . LOBECTOMY  12/02   RU; no radiation or chemo, Dr. Arlyce Dice  . TONSILLECTOMY    . TOTAL KNEE ARTHROPLASTY Right 12/11/2013   Procedure: RIGHT TOTAL KNEE ARTHROPLASTY;  Surgeon: Gearlean Alf, MD;  [facility];  Service: Orthopedics;  Laterality: Right;  . TOTAL KNEE ARTHROPLASTY Left 06/28/2017   Procedure: LEFT TOTAL KNEE ARTHROPLASTY;  Surgeon: Gaynelle Arabian, MD;  [facility];   Service: Orthopedics;  Laterality: Left;  Adductor Block    There were no vitals filed for this visit.  Subjective Assessment - 03/30/19 0846    Subjective  Pt asked for a review and reprint of HEP.  No pain today.     Currently in Pain?  Yes    Pain Score  2     Pain Location  Back    Pain Orientation  Right;Left    Pain Type  Chronic pain    Pain Onset  More than a month ago    Pain Frequency  Intermittent    Aggravating Factors   standing, walking     Pain Relieving Factors  sitting ,rest           [facility] Adult PT Treatment/Exercise - 03/30/19 0001      Self-Care   Self-Care  Other Self-Care Comments    Other Self-Care Comments   throughout session for proper form       Knee/Hip Exercises: Standing   Hip Extension  Stengthening;Both;2 sets;10 reps;Knee straight    Extension Limitations  done at countertop and then step to better isolate glute max and avoid compensations       Knee/Hip Exercises: Supine   Constance Haw  Strengthening;Both;1 set;10 reps    Bridges Limitations  with slight PPT     Bridges with Clamshell  Strengthening;Both;1 set;15 reps      Knee/Hip Exercises: Sidelying   Other Sidelying Knee/Hip Exercises  clam x 20     Other Sidelying Knee/Hip Exercises  reverse clam x 15 use ball between knees               PT Education - 03/30/19 0849    Education Details  reprint HEP and cues for routine     Person(s) Educated  Patient    Methods  Explanation;Tactile cues;Verbal cues;Handout    Comprehension  Verbalized understanding;Returned demonstration          PT Long Term Goals - 03/28/19 1531      PT LONG TERM GOAL #1   Title  I with advanced HEP    Status  On-going      PT LONG TERM GOAL #2   Title  improve FOTO =/< 44% limited     Status  On-going      PT LONG TERM GOAL #3   Title  report =/> 50% reduction of pain after taking her dog on her 2 mile walk (pain 3/10-4/10)    Baseline  some days 6/10, does not sit and rest.      Status   On-going      PT LONG TERM GOAL #4   Title  improve bilat hip abduction strength =/> 4+/5 to allow for no obvious trendelenburg with gait ( 02/23/2019)     Status  On-going            Plan - 03/30/19 0851    Clinical Impression Statement  Patient worked on Product manager her HEP and getting proper muscle activation.  She verbalizes the need for her to do these exercises "forever".  She is concerned about the fact that her MD is longer with hte practice.  Told her I will follow up about that, she is now seeing Dr. Tamala Julian.     PT Treatment/Interventions  Balance training;Neuromuscular re-education;Passive range of motion;Dry needling;Manual techniques;Ultrasound;Cryotherapy;Electrical Stimulation;Moist Heat;Therapeutic exercise;Therapeutic activities;Patient/family education;Taping    PT Next Visit Plan  progress hip strengthening and balance, gait , upper back    PT Home Exercise Plan  clam, reverse clam, standing ext, baby cobra, hamstring stretch, clam with bands, bridge     Consulted and Agree with Plan of Care  Patient       Patient will benefit from skilled therapeutic intervention in order to improve the following deficits and impairments:  Abnormal gait, Pain, Postural dysfunction, Increased muscle spasms, Decreased strength, Difficulty walking  Visit Diagnosis: Muscle weakness (generalized)  Abnormal posture  Chronic bilateral low back pain without sciatica     Problem List Patient Active Problem List   Diagnosis Date Noted  . Educated About Covid-19 Virus Infection 03/27/2019  . History of total knee replacement, left 11/04/2018  . Preventative health care 06/16/2016  . Knee pain 06/16/2016  . OSA (obstructive sleep apnea) 06/15/2016  . Depression 05/14/2016  . Multiple thyroid nodules 11/24/2015  . Degenerative arthritis of lumbar spine 09/03/2014  . Hyponatremia 12/12/2013  . Hypokalemia 12/12/2013  . OA (osteoarthritis) of knee 12/11/2013  . Osteoarthritis  11/14/2013  . BENIGN POSITIONAL VERTIGO 01/17/2010  . Urinary incontinence 04/26/2009  . Hyperlipidemia, mixed 02/04/2009  . Vitamin D deficiency 10/23/2008  . POLYARTHRALGIA 10/23/2008  . Osteoporosis 10/23/2008  . NEOPLASM, MALIGNANT, LUNG, NON-SMALL CELL 11/21/2007  . DIVERTICULOSIS 06/15/2007  .  GOITER, NODULAR 04/14/2007    Malesha Suliman 03/30/2019, 9:32 AM  [facility] [address] [place], [place], [address] Phone: [phone]   Fax:  [phone]  Name: Maria [name] MRN: [id number] Date of Birth: 06/04/1936   Raeford Razor, PT 03/30/19 9:32 AM Phone: [phone] Fax: [phone]

## 2019-03-30 NOTE — Telephone Encounter (Signed)
Copied from [place] [id number]. Topic: General - Inquiry >> Mar 30, 2019  9:43 AM Richardo Priest, NT wrote: Reason for CRM: Anderson Malta, PT for [facility], is calling in requesting a referral for Dr.Smith to continue seeing patient. Call back is [phone]

## 2019-04-03 ENCOUNTER — Ambulatory Visit: Payer: Medicare Other | Attending: Sports Medicine | Admitting: Physical Therapy

## 2019-04-03 ENCOUNTER — Encounter: Payer: Self-pay | Admitting: Physical Therapy

## 2019-04-03 ENCOUNTER — Ambulatory Visit: Payer: Medicare Other | Admitting: Physical Therapy

## 2019-04-03 ENCOUNTER — Other Ambulatory Visit: Payer: Self-pay

## 2019-04-03 DIAGNOSIS — M6281 Muscle weakness (generalized): Secondary | ICD-10-CM

## 2019-04-03 DIAGNOSIS — R293 Abnormal posture: Secondary | ICD-10-CM

## 2019-04-03 DIAGNOSIS — G8929 Other chronic pain: Secondary | ICD-10-CM | POA: Insufficient documentation

## 2019-04-03 DIAGNOSIS — M545 Low back pain: Secondary | ICD-10-CM | POA: Insufficient documentation

## 2019-04-03 NOTE — Therapy (Signed)
[place], [place], [address] Phone: [phone]   Fax:  [phone]  Physical Therapy Treatment  Patient Details  Name: Maria [name] MRN: [id number] Date of Birth: 25-Dec-1935 Referring Provider (PT): Dr Teresa Coombs   Encounter Date: 04/03/2019  PT End of Session - 04/03/19 1534    Visit Number  7    Number of Visits  12    Date for PT Re-Evaluation  04/24/19    PT Start Time  7782    PT Stop Time  1615    PT Time Calculation (min)  46 min    Activity Tolerance  Patient tolerated treatment well    Behavior During Therapy  [facility] for tasks assessed/performed       Past Medical History:  Diagnosis Date  . Complication of anesthesia    slow to awaken  . Depression   . Diverticulosis   . Goiter, nodular    [place] of thyroid stable 7/08  . Headache    hx of  . Heart murmur    due to rheumatic fever as child  . Mitral valve prolapse   . Multiple thyroid nodules 11/24/2015  . Neoplasm of lung, malignant ([place]) 2002   Non-small cell surgical tx  . Osteoarthritis 11/14/2013   2010 intra-articular steroids X 3 S/P Physical Therapy in 12/14 TKR 12/11/13   . Osteopenia    Dr Pamala Hurry  . PONV (postoperative nausea and vomiting)    n/v  . Sleep apnea   . Vitamin D deficiency     Past Surgical History:  Procedure Laterality Date  . ABDOMINAL HYSTERECTOMY  1998   No BSO, for dysfunctional menses  . BLADDER SUSPENSION Bilateral   . BREAST BIOPSY Right   . COLONOSCOPY  2009 , 2014   Diverticulosis; Dr. Olevia Perches  . LOBECTOMY  12/02   RU; no radiation or chemo, Dr. Arlyce Dice  . TONSILLECTOMY    . TOTAL KNEE ARTHROPLASTY Right 12/11/2013   Procedure: RIGHT TOTAL KNEE ARTHROPLASTY;  Surgeon: Gearlean Alf, MD;  Location: [facility];  Service: Orthopedics;  Laterality: Right;  . TOTAL KNEE ARTHROPLASTY Left 06/28/2017   Procedure: LEFT TOTAL KNEE ARTHROPLASTY;  Surgeon: Gaynelle Arabian, MD;  Location: [facility];   Service: Orthopedics;  Laterality: Left;  Adductor Block    There were no vitals filed for this visit.  Subjective Assessment - 04/03/19 1529    Subjective  Doing good today.  Went to the [employer].     Currently in Pain?  Yes          [facility] Adult PT Treatment/Exercise - 04/03/19 0001      Lumbar Exercises: Stretches   Active Hamstring Stretch  3 reps;30 seconds    Lower Trunk Rotation  10 seconds    Lower Trunk Rotation Limitations  x 10 with ball     Figure 4 Stretch  2 reps;30 seconds      Lumbar Exercises: Machines for Strengthening   Tread Mill  1.3 mph, 5 min       Lumbar Exercises: Prone   Straight Leg Raise  15 reps    Opposite Arm/Leg Raise  Right arm/Left leg;Left arm/Right leg;10 reps      Knee/Hip Exercises: Standing   Other Standing Knee Exercises  step ups 6 inch x 10 with opp knee lift with 1 UE hold       Knee/Hip Exercises: Supine   Heel Slides  Strengthening;Both;1 set;20 reps    Heel  Slides Limitations  legs on ball     Bridges  Strengthening;Both;1 set;10 reps    Straight Leg Raises  Strengthening;Both;1 set;10 reps    Straight Leg Raises Limitations  from ball                   PT Long Term Goals - 03/28/19 1531      PT LONG TERM GOAL #1   Title  I with advanced HEP    Status  On-going      PT LONG TERM GOAL #2   Title  improve FOTO =/< 44% limited     Status  On-going      PT LONG TERM GOAL #3   Title  report =/> 50% reduction of pain after taking her dog on her 2 mile walk (pain 3/10-4/10)    Baseline  some days 6/10, does not sit and rest.      Status  On-going      PT LONG TERM GOAL #4   Title  improve bilat hip abduction strength =/> 4+/5 to allow for no obvious trendelenburg with gait ( 02/23/2019)     Status  On-going            Plan - 04/03/19 1552    Clinical Impression Statement  New referral received for Dr. Hulan Saas.  No pain after treadmill walking.  Appreciated the stability ball exercises  today to work posterior chain.      Personal Factors and Comorbidities  Age;Time since onset of injury/illness/exacerbation;Comorbidity 3+    Comorbidities  bilat TKA, osteopenia in the spine, heart dx, h/o lung CA yrs ago, OA, bladder tacking.     Examination-Activity Limitations  Stairs;Locomotion Level    Examination-Participation Restrictions  Laundry;Community Activity;Valla Leaver Work    PT Treatment/Interventions  Administrator re-education;Passive range of motion;Dry needling;Manual techniques;Ultrasound;Cryotherapy;Electrical Stimulation;Moist Heat;Therapeutic exercise;Therapeutic activities;Patient/family education;Taping    PT Next Visit Plan  Try Reformer, progress hip strengthening and balance, gait , upper back/posterior    PT Home Exercise Plan  clam, reverse clam, standing ext, baby cobra, hamstring stretch, clam with bands, bridge     Consulted and Agree with Plan of Care  Patient       Patient will benefit from skilled therapeutic intervention in order to improve the following deficits and impairments:  Abnormal gait, Pain, Postural dysfunction, Increased muscle spasms, Decreased strength, Difficulty walking  Visit Diagnosis: Muscle weakness (generalized)  Abnormal posture  Chronic bilateral low back pain without sciatica     Problem List Patient Active Problem List   Diagnosis Date Noted  . Educated About Covid-19 Virus Infection 03/27/2019  . History of total knee replacement, left 11/04/2018  . Preventative health care 06/16/2016  . Knee pain 06/16/2016  . OSA (obstructive sleep apnea) 06/15/2016  . Depression 05/14/2016  . Multiple thyroid nodules 11/24/2015  . Degenerative arthritis of lumbar spine 09/03/2014  . Hyponatremia 12/12/2013  . Hypokalemia 12/12/2013  . OA (osteoarthritis) of knee 12/11/2013  . Osteoarthritis 11/14/2013  . BENIGN POSITIONAL VERTIGO 01/17/2010  . Urinary incontinence 04/26/2009  . Hyperlipidemia, mixed 02/04/2009  .  Vitamin D deficiency 10/23/2008  . POLYARTHRALGIA 10/23/2008  . Osteoporosis 10/23/2008  . NEOPLASM, MALIGNANT, LUNG, NON-SMALL CELL 11/21/2007  . DIVERTICULOSIS 06/15/2007  . GOITER, NODULAR 04/14/2007    PAA,JENNIFER 04/03/2019, 4:18 PM  [facility] [address] [place], [place], [address] Phone: [phone]   Fax:  [phone]  Name: Maria [name] MRN: [id number] Date of Birth: 02/20/1936  Raeford Razor, PT 04/03/19 4:18 PM Phone: [phone] Fax: [phone]

## 2019-04-05 ENCOUNTER — Other Ambulatory Visit: Payer: Self-pay

## 2019-04-05 ENCOUNTER — Encounter: Payer: Self-pay | Admitting: Physical Therapy

## 2019-04-05 ENCOUNTER — Ambulatory Visit: Payer: Medicare Other | Admitting: Physical Therapy

## 2019-04-05 DIAGNOSIS — G8929 Other chronic pain: Secondary | ICD-10-CM

## 2019-04-05 DIAGNOSIS — M6281 Muscle weakness (generalized): Secondary | ICD-10-CM

## 2019-04-05 DIAGNOSIS — R293 Abnormal posture: Secondary | ICD-10-CM

## 2019-04-05 NOTE — Therapy (Signed)
[place], [place], [address] Phone: [phone]   Fax:  [phone]  Physical Therapy Treatment  Patient Details  Name: Maria [name] MRN: [id number] Date of Birth: Oct 08, 1936 Referring Provider (PT): Dr Teresa Coombs, Dr. Hulan Saas    Encounter Date: 04/05/2019  PT End of Session - 04/05/19 0805    Visit Number  8    Number of Visits  12    Date for PT Re-Evaluation  04/24/19    PT Start Time  0800    PT Stop Time  0842    PT Time Calculation (min)  42 min    Activity Tolerance  Patient tolerated treatment well    Behavior During Therapy  [facility] for tasks assessed/performed       Past Medical History:  Diagnosis Date  . Complication of anesthesia    slow to awaken  . Depression   . Diverticulosis   . Goiter, nodular    [place] of thyroid stable 7/08  . Headache    hx of  . Heart murmur    due to rheumatic fever as child  . Mitral valve prolapse   . Multiple thyroid nodules 11/24/2015  . Neoplasm of lung, malignant ([place]) 2002   Non-small cell surgical tx  . Osteoarthritis 11/14/2013   2010 intra-articular steroids X 3 S/P Physical Therapy in 12/14 TKR 12/11/13   . Osteopenia    Dr Pamala Hurry  . PONV (postoperative nausea and vomiting)    n/v  . Sleep apnea   . Vitamin D deficiency     Past Surgical History:  Procedure Laterality Date  . ABDOMINAL HYSTERECTOMY  1998   No BSO, for dysfunctional menses  . BLADDER SUSPENSION Bilateral   . BREAST BIOPSY Right   . COLONOSCOPY  2009 , 2014   Diverticulosis; Dr. Olevia Perches  . LOBECTOMY  12/02   RU; no radiation or chemo, Dr. Arlyce Dice  . TONSILLECTOMY    . TOTAL KNEE ARTHROPLASTY Right 12/11/2013   Procedure: RIGHT TOTAL KNEE ARTHROPLASTY;  Surgeon: Gearlean Alf, MD;  Location: [facility];  Service: Orthopedics;  Laterality: Right;  . TOTAL KNEE ARTHROPLASTY Left 06/28/2017   Procedure: LEFT TOTAL KNEE ARTHROPLASTY;  Surgeon: Gaynelle Arabian, MD;   Location: [facility];  Service: Orthopedics;  Laterality: Left;  Adductor Block    There were no vitals filed for this visit.  Subjective Assessment - 04/05/19 0801    Subjective  I haven't had as much pain.  No pain right now.           [facility] Adult PT Treatment/Exercise - 04/05/19 0001      Pilates   Pilates Reformer  see note         Pilates Reformer used for LE/core strength, postural strength, lumbopelvic disassociation and core control.  Exercises included:  Footwork 2 Red 1 blue parallel on toes and heels  Heel raise x 10 with cues   Press out with heels on bar x 10 (narrow)   Single leg press 2 Red x 10   Bridging small ROM at first, 4 springs for assistance   Feet in Straps 1 Red 1 Yellow  Arcsx 10 in parallel and turnout x 10 Squat with min A for heel position and cues for control   Long box Prone Overhead press unable due to L shoulder pain  changed to hip extension x 10 alternating      PT Education - 04/05/19 0844    Education  Details  Pilates concepts     Person(s) Educated  Patient    Methods  Explanation    Comprehension  Verbalized understanding          PT Long Term Goals - 03/28/19 1531      PT LONG TERM GOAL #1   Title  I with advanced HEP    Status  On-going      PT LONG TERM GOAL #2   Title  improve FOTO =/< 44% limited     Status  On-going      PT LONG TERM GOAL #3   Title  report =/> 50% reduction of pain after taking her dog on her 2 mile walk (pain 3/10-4/10)    Baseline  some days 6/10, does not sit and rest.      Status  On-going      PT LONG TERM GOAL #4   Title  improve bilat hip abduction strength =/> 4+/5 to allow for no obvious trendelenburg with gait ( 02/23/2019)     Status  On-going            Plan - 04/05/19 0808    Clinical Impression Statement  Patient was familiar with Pilates from many years ago.  Beneficial for teaching core activation with hip extension that she cannot access in standing.  Felt great when  she left.      Personal Factors and Comorbidities  Age;Time since onset of injury/illness/exacerbation;Comorbidity 3+    Comorbidities  bilat TKA, osteopenia in the spine, heart dx, h/o lung CA yrs ago, OA, bladder tacking.     Examination-Activity Limitations  Stairs;Locomotion Level    Examination-Participation Restrictions  Laundry;Community Activity;Valla Leaver Work    PT Treatment/Interventions  Administrator re-education;Passive range of motion;Dry needling;Manual techniques;Ultrasound;Cryotherapy;Electrical Stimulation;Moist Heat;Therapeutic exercise;Therapeutic activities;Patient/family education;Taping    PT Next Visit Plan   Reformer/Tower (needed min A for prone and getting feet in straps) progress hip strengthening and balance, gait , upper back/posterior    PT Home Exercise Plan  clam, reverse clam, standing ext, baby cobra, hamstring stretch, clam with bands, bridge     Consulted and Agree with Plan of Care  Patient       Patient will benefit from skilled therapeutic intervention in order to improve the following deficits and impairments:  Abnormal gait, Pain, Postural dysfunction, Increased muscle spasms, Decreased strength, Difficulty walking  Visit Diagnosis: Muscle weakness (generalized)  Abnormal posture  Chronic bilateral low back pain without sciatica     Problem List Patient Active Problem List   Diagnosis Date Noted  . Educated About Covid-19 Virus Infection 03/27/2019  . History of total knee replacement, left 11/04/2018  . Preventative health care 06/16/2016  . Knee pain 06/16/2016  . OSA (obstructive sleep apnea) 06/15/2016  . Depression 05/14/2016  . Multiple thyroid nodules 11/24/2015  . Degenerative arthritis of lumbar spine 09/03/2014  . Hyponatremia 12/12/2013  . Hypokalemia 12/12/2013  . OA (osteoarthritis) of knee 12/11/2013  . Osteoarthritis 11/14/2013  . BENIGN POSITIONAL VERTIGO 01/17/2010  . Urinary incontinence 04/26/2009  .  Hyperlipidemia, mixed 02/04/2009  . Vitamin D deficiency 10/23/2008  . POLYARTHRALGIA 10/23/2008  . Osteoporosis 10/23/2008  . NEOPLASM, MALIGNANT, LUNG, NON-SMALL CELL 11/21/2007  . DIVERTICULOSIS 06/15/2007  . GOITER, NODULAR 04/14/2007    Tongela Encinas 04/05/2019, 8:46 AM  [facility] [address] [place], [place], [address] Phone: [phone]   Fax:  [phone]  Name: MUNIRAH DOERNER MRN: [id number] Date of Birth: 06/21/1936  Raeford Razor,  PT 04/05/19 8:47 AM Phone: [phone] Fax: [phone]

## 2019-04-10 ENCOUNTER — Ambulatory Visit: Payer: Medicare Other | Admitting: Physical Therapy

## 2019-04-10 ENCOUNTER — Other Ambulatory Visit: Payer: Self-pay

## 2019-04-10 ENCOUNTER — Encounter: Payer: Self-pay | Admitting: Physical Therapy

## 2019-04-10 DIAGNOSIS — M6281 Muscle weakness (generalized): Secondary | ICD-10-CM | POA: Diagnosis not present

## 2019-04-10 DIAGNOSIS — G8929 Other chronic pain: Secondary | ICD-10-CM

## 2019-04-10 DIAGNOSIS — R293 Abnormal posture: Secondary | ICD-10-CM

## 2019-04-10 NOTE — Therapy (Signed)
[place], [place], [address] Phone: [phone]   Fax:  [phone]  Physical Therapy Treatment  Patient Details  Name: Maria [name] MRN: [id number] Date of Birth: Apr 01, 1936 Referring Provider (PT): Dr Teresa Coombs   Encounter Date: 04/10/2019  PT End of Session - 04/10/19 1035    Visit Number  9    Number of Visits  12    Date for PT Re-Evaluation  04/24/19    PT Start Time  0830    PT Stop Time  0925    PT Time Calculation (min)  55 min       Past Medical History:  Diagnosis Date  . Complication of anesthesia    slow to awaken  . Depression   . Diverticulosis   . Goiter, nodular    [place] of thyroid stable 7/08  . Headache    hx of  . Heart murmur    due to rheumatic fever as child  . Mitral valve prolapse   . Multiple thyroid nodules 11/24/2015  . Neoplasm of lung, malignant ([place]) 2002   Non-small cell surgical tx  . Osteoarthritis 11/14/2013   2010 intra-articular steroids X 3 S/P Physical Therapy in 12/14 TKR 12/11/13   . Osteopenia    Dr Pamala Hurry  . PONV (postoperative nausea and vomiting)    n/v  . Sleep apnea   . Vitamin D deficiency     Past Surgical History:  Procedure Laterality Date  . ABDOMINAL HYSTERECTOMY  1998   No BSO, for dysfunctional menses  . BLADDER SUSPENSION Bilateral   . BREAST BIOPSY Right   . COLONOSCOPY  2009 , 2014   Diverticulosis; Dr. Olevia Perches  . LOBECTOMY  12/02   RU; no radiation or chemo, Dr. Arlyce Dice  . TONSILLECTOMY    . TOTAL KNEE ARTHROPLASTY Right 12/11/2013   Procedure: RIGHT TOTAL KNEE ARTHROPLASTY;  Surgeon: Gearlean Alf, MD;  Location: [facility];  Service: Orthopedics;  Laterality: Right;  . TOTAL KNEE ARTHROPLASTY Left 06/28/2017   Procedure: LEFT TOTAL KNEE ARTHROPLASTY;  Surgeon: Gaynelle Arabian, MD;  Location: [facility];  Service: Orthopedics;  Laterality: Left;  Adductor Block    There were no vitals filed for this visit.  Subjective  Assessment - 04/10/19 1034    Subjective  I am improving. I am doing my exercises.     Currently in Pain?  No/denies                       [facility] Adult PT Treatment/Exercise - 04/10/19 0001      Neuro Re-ed    Neuro Re-ed Details   Narrow stance with head and trunk turns, staggered stance with head turns and trunk turns, static tandem stance -needs UE assist to maintain, side stepping at counter- unable to maintain trunk extension without UE support.       Pilates   Pilates Reformer  Foot work on heels 2 red 1 yellow 1 blue , feet in straps 1 red 1 yellow small arcs neutral and ER, squats       Knee/Hip Exercises: Supine   Bridges  20 reps    Bridges Limitations  push through heels     Straight Leg Raises  Strengthening;Both;1 set;10 reps    Straight Leg Raises Limitations  from mat       Knee/Hip Exercises: Sidelying   Clams  10 reps with 5 sec hold  PT Long Term Goals - 03/28/19 1531      PT LONG TERM GOAL #1   Title  I with advanced HEP    Status  On-going      PT LONG TERM GOAL #2   Title  improve FOTO =/< 44% limited     Status  On-going      PT LONG TERM GOAL #3   Title  report =/> 50% reduction of pain after taking her dog on her 2 mile walk (pain 3/10-4/10)    Baseline  some days 6/10, does not sit and rest.      Status  On-going      PT LONG TERM GOAL #4   Title  improve bilat hip abduction strength =/> 4+/5 to allow for no obvious trendelenburg with gait ( 02/23/2019)     Status  On-going            Plan - 04/10/19 1154    Clinical Impression Statement  Pt reports overall improvement. No pain with 1 mile walking dog, She does have pain with 2 mile walk. She liked the reformer and felt better after last session. We reviewed some HEP, repeated reformer and progressed to standing balance, updated HEP. She felt good at end of session. Needs UE support with tandem stance. Progressing toward LTGs.     PT Next Visit Plan    Reformer/Tower (needed min A for prone and getting feet in straps) progress hip strengthening and balance (review new HEP and progress in clinic), gait , upper back/posterior    PT Home Exercise Plan  clam, reverse clam, standing ext, baby cobra, hamstring stretch, clam with bands, bridge , staggered stance with head turns, tandem stance with UE touch    Consulted and Agree with Plan of Care  Patient       Patient will benefit from skilled therapeutic intervention in order to improve the following deficits and impairments:  Abnormal gait, Pain, Postural dysfunction, Increased muscle spasms, Decreased strength, Difficulty walking  Visit Diagnosis: Muscle weakness (generalized)  Abnormal posture  Chronic bilateral low back pain without sciatica     Problem List Patient Active Problem List   Diagnosis Date Noted  . Educated About Covid-19 Virus Infection 03/27/2019  . History of total knee replacement, left 11/04/2018  . Preventative health care 06/16/2016  . Knee pain 06/16/2016  . OSA (obstructive sleep apnea) 06/15/2016  . Depression 05/14/2016  . Multiple thyroid nodules 11/24/2015  . Degenerative arthritis of lumbar spine 09/03/2014  . Hyponatremia 12/12/2013  . Hypokalemia 12/12/2013  . OA (osteoarthritis) of knee 12/11/2013  . Osteoarthritis 11/14/2013  . BENIGN POSITIONAL VERTIGO 01/17/2010  . Urinary incontinence 04/26/2009  . Hyperlipidemia, mixed 02/04/2009  . Vitamin D deficiency 10/23/2008  . POLYARTHRALGIA 10/23/2008  . Osteoporosis 10/23/2008  . NEOPLASM, MALIGNANT, LUNG, NON-SMALL CELL 11/21/2007  . DIVERTICULOSIS 06/15/2007  . GOITER, NODULAR 04/14/2007    Dorene Ar, PTA 04/10/2019, 12:07 PM  [place], [place], [address] Phone: [phone]   Fax:  [phone]  Name: Maria [name] MRN: [id number] Date of Birth: 02/18/1936

## 2019-04-12 ENCOUNTER — Ambulatory Visit: Payer: Medicare Other | Admitting: Physical Therapy

## 2019-04-12 ENCOUNTER — Other Ambulatory Visit: Payer: Self-pay

## 2019-04-12 ENCOUNTER — Encounter: Payer: Self-pay | Admitting: Physical Therapy

## 2019-04-12 DIAGNOSIS — G8929 Other chronic pain: Secondary | ICD-10-CM

## 2019-04-12 DIAGNOSIS — R293 Abnormal posture: Secondary | ICD-10-CM

## 2019-04-12 DIAGNOSIS — M6281 Muscle weakness (generalized): Secondary | ICD-10-CM

## 2019-04-12 NOTE — Therapy (Signed)
[place], [place], [address] Phone: [phone]   Fax:  [phone]  Physical Therapy Treatment  Patient Details  Name: Maria [name] MRN: [id number] Date of Birth: 1936-01-07 Referring Provider (PT): Dr Teresa Coombs   Encounter Date: 04/12/2019  PT End of Session - 04/12/19 0928    Visit Number  10    Date for PT Re-Evaluation  04/24/19    PT Start Time  0830    PT Stop Time  0915    PT Time Calculation (min)  45 min       Past Medical History:  Diagnosis Date  . Complication of anesthesia    slow to awaken  . Depression   . Diverticulosis   . Goiter, nodular    [place] of thyroid stable 7/08  . Headache    hx of  . Heart murmur    due to rheumatic fever as child  . Mitral valve prolapse   . Multiple thyroid nodules 11/24/2015  . Neoplasm of lung, malignant ([place]) 2002   Non-small cell surgical tx  . Osteoarthritis 11/14/2013   2010 intra-articular steroids X 3 S/P Physical Therapy in 12/14 TKR 12/11/13   . Osteopenia    Dr Pamala Hurry  . PONV (postoperative nausea and vomiting)    n/v  . Sleep apnea   . Vitamin D deficiency     Past Surgical History:  Procedure Laterality Date  . ABDOMINAL HYSTERECTOMY  1998   No BSO, for dysfunctional menses  . BLADDER SUSPENSION Bilateral   . BREAST BIOPSY Right   . COLONOSCOPY  2009 , 2014   Diverticulosis; Dr. Olevia Perches  . LOBECTOMY  12/02   RU; no radiation or chemo, Dr. Arlyce Dice  . TONSILLECTOMY    . TOTAL KNEE ARTHROPLASTY Right 12/11/2013   Procedure: RIGHT TOTAL KNEE ARTHROPLASTY;  Surgeon: Gearlean Alf, MD;  Location: [facility];  Service: Orthopedics;  Laterality: Right;  . TOTAL KNEE ARTHROPLASTY Left 06/28/2017   Procedure: LEFT TOTAL KNEE ARTHROPLASTY;  Surgeon: Gaynelle Arabian, MD;  Location: [facility];  Service: Orthopedics;  Laterality: Left;  Adductor Block    There were no vitals filed for this visit.  Subjective Assessment - 04/12/19 0834     Subjective  Some pain, not much. I was a little sore after last session.     Currently in Pain?  No/denies    Aggravating Factors   standing, walking prolonged     Pain Relieving Factors  sitting, rest                        [facility] Adult PT Treatment/Exercise - 04/12/19 0001      Neuro Re-ed    Neuro Re-ed Details   Narrow stance with head and trunk turns, staggered stance with head turns and trunk turns, static tandem stance -needs UE assist to maintain, side stepping at counter- unable to maintain trunk extension without UE support.       Lumbar Exercises: Stretches   Other Lumbar Stretch Exercise  doorway pec stretch 3 x 20 sec       Lumbar Exercises: Standing   Row  15 reps;Theraband    Theraband Level (Row)  Level 2 (Red)    Shoulder Extension  15 reps;Theraband    Theraband Level (Shoulder Extension)  Level 2 (Red)    Other Standing Lumbar Exercises  hip abduction 10 x 2 bilat, focus on level hip and trunk extension- light touch  required.      Lumbar Exercises: Seated   Other Seated Lumbar Exercises  scap positoning/maintain posture     Other Seated Lumbar Exercises  horizontal abduction red band, Er red band       Knee/Hip Exercises: Aerobic   Tread Mill  1.3 mph, 5 min              PT Education - 04/12/19 0920    Education Details  HEP    Person(s) Educated  Patient    Methods  Handout;Explanation    Comprehension  Verbalized understanding          PT Long Term Goals - 03/28/19 1531      PT LONG TERM GOAL #1   Title  I with advanced HEP    Status  On-going      PT LONG TERM GOAL #2   Title  improve FOTO =/< 44% limited     Status  On-going      PT LONG TERM GOAL #3   Title  report =/> 50% reduction of pain after taking her dog on her 2 mile walk (pain 3/10-4/10)    Baseline  some days 6/10, does not sit and rest.      Status  On-going      PT LONG TERM GOAL #4   Title  improve bilat hip abduction strength =/> 4+/5 to allow for  no obvious trendelenburg with gait ( 02/23/2019)     Status  On-going            Plan - 04/12/19 0920    Clinical Impression Statement  Pt reports mild soreness in upper back after last session. No pain or soreness at start of session. Focused balance and postural exercises today. SHe has left hip drop with Right SLS however she is able to correct with cues. Issued doorway stretch and theraband strengthening for posture to HEP.  She was fatigued at end of session. No increased pain.     PT Next Visit Plan   Reformer/Tower (needed min A for prone and getting feet in straps) progress hip strengthening and balance (review new HEP and progress in clinic), gait , upper back/posterior    PT Home Exercise Plan  clam, reverse clam, standing ext, baby cobra, hamstring stretch, clam with bands, bridge , staggered stance with head turns, tandem stance with UE touch, doorway stretch,, standing hip abduction, row and shoulder extension     Consulted and Agree with Plan of Care  Patient       Patient will benefit from skilled therapeutic intervention in order to improve the following deficits and impairments:  Abnormal gait, Pain, Postural dysfunction, Increased muscle spasms, Decreased strength, Difficulty walking  Visit Diagnosis: Muscle weakness (generalized)  Abnormal posture  Chronic bilateral low back pain without sciatica     Problem List Patient Active Problem List   Diagnosis Date Noted  . Educated About Covid-19 Virus Infection 03/27/2019  . History of total knee replacement, left 11/04/2018  . Preventative health care 06/16/2016  . Knee pain 06/16/2016  . OSA (obstructive sleep apnea) 06/15/2016  . Depression 05/14/2016  . Multiple thyroid nodules 11/24/2015  . Degenerative arthritis of lumbar spine 09/03/2014  . Hyponatremia 12/12/2013  . Hypokalemia 12/12/2013  . OA (osteoarthritis) of knee 12/11/2013  . Osteoarthritis 11/14/2013  . BENIGN POSITIONAL VERTIGO 01/17/2010  .  Urinary incontinence 04/26/2009  . Hyperlipidemia, mixed 02/04/2009  . Vitamin D deficiency 10/23/2008  . POLYARTHRALGIA 10/23/2008  . Osteoporosis 10/23/2008  .  NEOPLASM, MALIGNANT, LUNG, NON-SMALL CELL 11/21/2007  . DIVERTICULOSIS 06/15/2007  . GOITER, NODULAR 04/14/2007    Dorene Ar, PTA 04/12/2019, 9:28 AM  [place], [place], [address] Phone: [phone]   Fax:  [phone]  Name: Maria [name] MRN: [id number] Date of Birth: [birth year]

## 2019-04-17 ENCOUNTER — Other Ambulatory Visit: Payer: Self-pay

## 2019-04-17 ENCOUNTER — Encounter: Payer: Self-pay | Admitting: Physical Therapy

## 2019-04-17 ENCOUNTER — Ambulatory Visit: Payer: Medicare Other | Admitting: Physical Therapy

## 2019-04-17 DIAGNOSIS — R293 Abnormal posture: Secondary | ICD-10-CM

## 2019-04-17 DIAGNOSIS — M6281 Muscle weakness (generalized): Secondary | ICD-10-CM | POA: Diagnosis not present

## 2019-04-17 DIAGNOSIS — M545 Low back pain, unspecified: Secondary | ICD-10-CM

## 2019-04-17 DIAGNOSIS — G8929 Other chronic pain: Secondary | ICD-10-CM

## 2019-04-17 NOTE — Therapy (Signed)
[place], [place], [address] Phone: [phone]   Fax:  [phone]  Physical Therapy Treatment  Patient Details  Name: Maria [name] MRN: [id number] Date of Birth: 10/03/1936 Referring Provider (PT): Dr. Hulan Saas    Encounter Date: 04/17/2019  PT End of Session - 04/17/19 1236    Visit Number  11    Number of Visits  12    Date for PT Re-Evaluation  04/24/19    PT Start Time  1233    PT Stop Time  1314    PT Time Calculation (min)  41 min    Activity Tolerance  Patient tolerated treatment well    Behavior During Therapy  [facility] for tasks assessed/performed       Past Medical History:  Diagnosis Date  . Complication of anesthesia    slow to awaken  . Depression   . Diverticulosis   . Goiter, nodular    [place] of thyroid stable 7/08  . Headache    hx of  . Heart murmur    due to rheumatic fever as child  . Mitral valve prolapse   . Multiple thyroid nodules 11/24/2015  . Neoplasm of lung, malignant ([place]) 2002   Non-small cell surgical tx  . Osteoarthritis 11/14/2013   2010 intra-articular steroids X 3 S/P Physical Therapy in 12/14 TKR 12/11/13   . Osteopenia    Dr Pamala Hurry  . PONV (postoperative nausea and vomiting)    n/v  . Sleep apnea   . Vitamin D deficiency     Past Surgical History:  Procedure Laterality Date  . ABDOMINAL HYSTERECTOMY  1998   No BSO, for dysfunctional menses  . BLADDER SUSPENSION Bilateral   . BREAST BIOPSY Right   . COLONOSCOPY  2009 , 2014   Diverticulosis; Dr. Olevia Perches  . LOBECTOMY  12/02   RU; no radiation or chemo, Dr. Arlyce Dice  . TONSILLECTOMY    . TOTAL KNEE ARTHROPLASTY Right 12/11/2013   Procedure: RIGHT TOTAL KNEE ARTHROPLASTY;  Surgeon: Gearlean Alf, MD;  Location: [facility];  Service: Orthopedics;  Laterality: Right;  . TOTAL KNEE ARTHROPLASTY Left 06/28/2017   Procedure: LEFT TOTAL KNEE ARTHROPLASTY;  Surgeon: Gaynelle Arabian, MD;  Location: [facility];   Service: Orthopedics;  Laterality: Left;  Adductor Block    There were no vitals filed for this visit.  Subjective Assessment - 04/17/19 1246    Subjective  Back is 3/10.  I think I'll finish PT and go on hold.  Id like to do some Pilates.    Currently in Pain?  Yes    Pain Score  3     Pain Location  Back    Pain Orientation  Right;Left;Lower    Pain Descriptors / Indicators  Sore    Pain Type  Chronic pain    Pain Onset  More than a month ago    Aggravating Factors   standing, walking    Pain Relieving Factors  sitting, rest         [facility] PT Assessment - 04/17/19 0001      Assessment   Referring Provider (PT)  Dr. Hulan Saas        [facility] Adult PT Treatment/Exercise - 04/17/19 0001      Pilates   Pilates Tower  see note          Pilates Tower for LE/Core strength, postural strength, lumbopelvic disassociation and core control.  Exercises included:  Supine Leg [place] unilateral  leg arcs yellow springs   Bilateral circles and Arcs   X 10 each parallel with hands on  Scissors small ROM    Supine Arm [place] 1 Yellow Arcs bilateral with knees bent  Single arm with opposite knee fold   Bridge end of session to stretch spine x 10   Added arms arcs with yellow spring x 10  Cramps in LLE with bridging   PT Long Term Goals - 03/28/19 1531      PT LONG TERM GOAL #1   Title  I with advanced HEP    Status  On-going      PT LONG TERM GOAL #2   Title  improve FOTO =/< 44% limited     Status  On-going      PT LONG TERM GOAL #3   Title  report =/> 50% reduction of pain after taking her dog on her 2 mile walk (pain 3/10-4/10)    Baseline  some days 6/10, does not sit and rest.      Status  On-going      PT LONG TERM GOAL #4   Title  improve bilat hip abduction strength =/> 4+/5 to allow for no obvious trendelenburg with gait ( 02/23/2019)     Status  On-going            Plan - 04/17/19 1249    Clinical Impression Statement  Continues to benefit from PT  for core and postural strength. Used Pilates for core work, needed some assist for controlling the springs.    Personal Factors and Comorbidities  Age;Time since onset of injury/illness/exacerbation;Comorbidity 3+    Comorbidities  bilat TKA, osteopenia in the spine, heart dx, h/o lung CA yrs ago, OA, bladder tacking.     Examination-Activity Limitations  Stairs;Locomotion Level    Examination-Participation Restrictions  Laundry;Community Activity;[place] [employer] Work    PT Treatment/Interventions  Administrator re-education;Passive range of motion;Dry needling;Manual techniques;Ultrasound;Cryotherapy;Electrical Stimulation;Moist Heat;Therapeutic exercise;Therapeutic activities;Patient/family education;Taping    PT Next Visit Plan  FOTO, recheck MMT, Reformer/Tower (needed min A for prone and getting feet in straps) progress hip strengthening and balance (review new HEP and progress in clinic), gait , upper back/posterior    PT Home Exercise Plan  clam, reverse clam, standing ext, baby cobra, hamstring stretch, clam with bands, bridge , staggered stance with head turns, tandem stance with UE touch, doorway stretch,, standing hip abduction, row and shoulder extension     Consulted and Agree with Plan of Care  Patient       Patient will benefit from skilled therapeutic intervention in order to improve the following deficits and impairments:  Abnormal gait, Pain, Postural dysfunction, Increased muscle spasms, Decreased strength, Difficulty walking        Problem List Patient Active Problem List   Diagnosis Date Noted  . Educated About Covid-19 Virus Infection 03/27/2019  . History of total knee replacement, left 11/04/2018  . Preventative health care 06/16/2016  . Knee pain 06/16/2016  . OSA (obstructive sleep apnea) 06/15/2016  . Depression 05/14/2016  . Multiple thyroid nodules 11/24/2015  . Degenerative arthritis of lumbar spine 09/03/2014  . Hyponatremia 12/12/2013  .  Hypokalemia 12/12/2013  . OA (osteoarthritis) of knee 12/11/2013  . Osteoarthritis 11/14/2013  . BENIGN POSITIONAL VERTIGO 01/17/2010  . Urinary incontinence 04/26/2009  . Hyperlipidemia, mixed 02/04/2009  . Vitamin D deficiency 10/23/2008  . POLYARTHRALGIA 10/23/2008  . Osteoporosis 10/23/2008  . NEOPLASM, MALIGNANT, LUNG, NON-SMALL CELL 11/21/2007  . DIVERTICULOSIS 06/15/2007  . GOITER, NODULAR  04/14/2007    , 04/17/2019, 1:24 PM  [place], [place], [address] Phone: [phone]   Fax:  [phone]  Name: Maria [name] MRN: [id number] Date of Birth: 1936/04/01  Raeford Razor, PT 04/17/19 1:25 PM Phone: [phone] Fax: [phone]

## 2019-04-19 ENCOUNTER — Ambulatory Visit: Payer: Medicare Other | Admitting: Physical Therapy

## 2019-04-19 ENCOUNTER — Other Ambulatory Visit: Payer: Self-pay

## 2019-04-19 DIAGNOSIS — R293 Abnormal posture: Secondary | ICD-10-CM

## 2019-04-19 DIAGNOSIS — M6281 Muscle weakness (generalized): Secondary | ICD-10-CM | POA: Diagnosis not present

## 2019-04-19 DIAGNOSIS — M545 Low back pain, unspecified: Secondary | ICD-10-CM

## 2019-04-19 DIAGNOSIS — G8929 Other chronic pain: Secondary | ICD-10-CM

## 2019-04-19 NOTE — Patient Instructions (Signed)
Step 1  Step 2  Standing Hip Extension with Resistance at Ankles and Unilateral Counter Support reps: 10  sets: 2  hold: 5  daily: 1  weekly: 7 Setup  Begin in a standing upright position with one hand resting on a counter in front of you and a resistance band looped around your ankles. Movement  Lift one leg straight backward, pushing against the resistance. Hold briefly, then slowly return to the starting position and repeat. Tip  Make sure to maintain an upright posture and use the counter to help you balance as needed.

## 2019-04-19 NOTE — Therapy (Signed)
[place], [place], [address] Phone: [phone]   Fax:  [phone]  Physical Therapy Treatment/Renewal   Patient Details  Name: Maria [name] MRN: [id number] Date of Birth: [birth year] Referring Provider (PT): Dr. Hulan Saas    Encounter Date: 04/19/2019  PT End of Session - 04/19/19 0917    Visit Number  12    Number of Visits  --    Date for PT Re-Evaluation  06/14/19   8 weeks (on hold for a few weeks)   PT Start Time  0907    PT Stop Time  0951    PT Time Calculation (min)  44 min    Activity Tolerance  Patient tolerated treatment well    Behavior During Therapy  [facility] for tasks assessed/performed       Past Medical History:  Diagnosis Date  . Complication of anesthesia    slow to awaken  . Depression   . Diverticulosis   . Goiter, nodular    [place] of thyroid stable 7/08  . Headache    hx of  . Heart murmur    due to rheumatic fever as child  . Mitral valve prolapse   . Multiple thyroid nodules 11/24/2015  . Neoplasm of lung, malignant ([place]) 2002   Non-small cell surgical tx  . Osteoarthritis 11/14/2013   2010 intra-articular steroids X 3 S/P Physical Therapy in 12/14 TKR 12/11/13   . Osteopenia    Dr Pamala Hurry  . PONV (postoperative nausea and vomiting)    n/v  . Sleep apnea   . Vitamin D deficiency     Past Surgical History:  Procedure Laterality Date  . ABDOMINAL HYSTERECTOMY  1998   No BSO, for dysfunctional menses  . BLADDER SUSPENSION Bilateral   . BREAST BIOPSY Right   . COLONOSCOPY  2009 , 2014   Diverticulosis; Dr. Olevia Perches  . LOBECTOMY  12/02   RU; no radiation or chemo, Dr. Arlyce Dice  . TONSILLECTOMY    . TOTAL KNEE ARTHROPLASTY Right 12/11/2013   Procedure: RIGHT TOTAL KNEE ARTHROPLASTY;  Surgeon: Gearlean Alf, MD;  [facility]: [facility];  Service: Orthopedics;  Laterality: Right;  . TOTAL KNEE ARTHROPLASTY Left 06/28/2017   Procedure: LEFT TOTAL KNEE ARTHROPLASTY;   Surgeon: Gaynelle Arabian, MD;  Location: [facility];  Service: Orthopedics;  Laterality: Left;  Adductor Block    There were no vitals filed for this visit.  Subjective Assessment - 04/19/19 0912    Subjective  No pain right now.  I get up and sit on ice.  AM are stiff.         [facility] PT Assessment - 04/19/19 0001      Observation/Other Assessments   Focus on Therapeutic Outcomes (FOTO)   40%      AROM   Lumbar Flexion  to lower shin     Lumbar Extension  no pain, 50%     Lumbar - Right Side Bend  50% discomfort     Lumbar - Left Side Bend  50% discomfort     Lumbar - Right Rotation  stiff 25%     Lumbar - Left Rotation  stiff 25%       Strength   Right Hip Flexion  4+/5    Right Hip ABduction  3-/5    Left Hip Flexion  4/5    Left Hip ABduction  3-/5   bent knee 4/5   Right/Left Knee  --   [facility]  [place] Adult PT Treatment/Exercise - 04/19/19 0001      Self-Care   Other Self-Care Comments   cues throughout, technique for HEP, hip strength, progress, POC       Lumbar Exercises: Standing   Other Standing Lumbar Exercises  hip extension and hip abdcution with bolster for UE support x 10       Lumbar Exercises: Seated   Sit to Stand  5 reps    Sit to Stand Limitations  times 21 sec (winded) , 30 sec AMRAP 9.5 reps     Other Seated Lumbar Exercises  seated clam blue band x 10       Lumbar Exercises: Supine   Clam  20 reps    Clam Limitations  blue band       Lumbar Exercises: Sidelying   Clam  Both;10 reps    Hip Abduction  10 reps    Hip Abduction Weights (lbs)  bent knee     Other Sidelying Lumbar Exercises  hip abd with knee extended too difficult (3-/5)              PT Education - 04/19/19 0955    Education Details  progress, POC, HEP    Person(s) Educated  Patient    Methods  Explanation    Comprehension  Verbalized understanding;Returned demonstration          PT Long Term Goals - 04/19/19 0956      PT LONG TERM GOAL #1   Title  I with  advanced HEP    Status  On-going      PT LONG TERM GOAL #2   Title  improve FOTO =/< 44% limited     Status  Achieved      PT LONG TERM GOAL #3   Title  report =/> 50% reduction of pain after taking her dog on her 2 mile walk (pain 3/10-4/10)    Status  Partially Met      PT LONG TERM GOAL #4   Title  improve bilat hip abduction strength =/> 4+/5 to allow for no obvious trendelenburg with gait ( 02/23/2019)     Status  On-going            Plan - 04/19/19 0957    Clinical Impression Statement  Renewed for 8 weeks.  She may go on a brief hold to see if she can stay consistent with her HEP.  Continues to significant hip weakness, poor postural strength.  She feels stronger overall and FOTO score improved to 40%    Personal Factors and Comorbidities  Age;Time since onset of injury/illness/exacerbation;Comorbidity 3+    Comorbidities  bilat TKA, osteopenia in the spine, heart dx, h/o lung CA yrs ago, OA, bladder tacking.     Examination-Activity Limitations  Stairs;Locomotion Level    Examination-Participation Restrictions  Laundry;Community Activity;Yard Work    Rehab Potential  Good    PT Frequency  2x / week    PT Duration  8 weeks    PT Treatment/Interventions  Balance training;Neuromuscular re-education;Passive range of motion;Dry needling;Manual techniques;Ultrasound;Cryotherapy;Electrical Stimulation;Moist Heat;Therapeutic exercise;Therapeutic activities;Patient/family education;Taping    PT Next Visit Plan  progress gait, hip and core strength, pilates    PT Home Exercise Plan  clam, reverse clam, standing ext, baby cobra, hamstring stretch, clam with bands, bridge , staggered stance with head turns, tandem stance with UE touch, doorway stretch,, standing hip abduction, row and shoulder extension     Consulted and Agree with Plan of Care  Patient  Patient will benefit from skilled therapeutic intervention in order to improve the following deficits and impairments:   Abnormal gait, Pain, Postural dysfunction, Increased muscle spasms, Decreased strength, Difficulty walking  Visit Diagnosis: 1. Muscle weakness (generalized)   2. Abnormal posture   3. Chronic bilateral low back pain without sciatica        Problem List Patient Active Problem List   Diagnosis Date Noted  . Educated About Covid-19 Virus Infection 03/27/2019  . History of total knee replacement, left 11/04/2018  . Preventative health care 06/16/2016  . Knee pain 06/16/2016  . OSA (obstructive sleep apnea) 06/15/2016  . Depression 05/14/2016  . Multiple thyroid nodules 11/24/2015  . Degenerative arthritis of lumbar spine 09/03/2014  . Hyponatremia 12/12/2013  . Hypokalemia 12/12/2013  . OA (osteoarthritis) of knee 12/11/2013  . Osteoarthritis 11/14/2013  . BENIGN POSITIONAL VERTIGO 01/17/2010  . Urinary incontinence 04/26/2009  . Hyperlipidemia, mixed 02/04/2009  . Vitamin D deficiency 10/23/2008  . POLYARTHRALGIA 10/23/2008  . Osteoporosis 10/23/2008  . NEOPLASM, MALIGNANT, LUNG, NON-SMALL CELL 11/21/2007  . DIVERTICULOSIS 06/15/2007  . GOITER, NODULAR 04/14/2007    Mckenlee Mangham 04/19/2019, 11:09 AM  [facility] [address] [place], [place], [address] Phone: [phone]   Fax:  [phone]  Name: Maria [name] MRN: [id number] Date of Birth: 03/18/1936  Raeford Razor, PT 04/19/19 11:09 AM Phone: [phone] Fax: [phone]

## 2019-04-24 ENCOUNTER — Other Ambulatory Visit: Payer: Self-pay

## 2019-04-24 ENCOUNTER — Ambulatory Visit: Payer: Medicare Other | Admitting: Physical Therapy

## 2019-04-24 ENCOUNTER — Encounter: Payer: Self-pay | Admitting: Physical Therapy

## 2019-04-24 DIAGNOSIS — M6281 Muscle weakness (generalized): Secondary | ICD-10-CM

## 2019-04-24 DIAGNOSIS — G8929 Other chronic pain: Secondary | ICD-10-CM

## 2019-04-24 DIAGNOSIS — R293 Abnormal posture: Secondary | ICD-10-CM

## 2019-04-24 NOTE — Therapy (Signed)
[place], [place], [address] Phone: [phone]   Fax:  [phone]  Physical Therapy Treatment  Patient Details  Name: Maria [name] MRN: [id number] Date of Birth: 04/17/1936 Referring Provider (PT): Dr. Hulan Saas    Encounter Date: 04/24/2019  PT End of Session - 04/24/19 0811    Visit Number  13    Date for PT Re-Evaluation  06/14/19    PT Start Time  0803    PT Stop Time  0848    PT Time Calculation (min)  45 min    Activity Tolerance  Patient tolerated treatment well    Behavior During Therapy  [facility] for tasks assessed/performed       Past Medical History:  Diagnosis Date  . Complication of anesthesia    slow to awaken  . Depression   . Diverticulosis   . Goiter, nodular    [place] of thyroid stable 7/08  . Headache    hx of  . Heart murmur    due to rheumatic fever as child  . Mitral valve prolapse   . Multiple thyroid nodules 11/24/2015  . Neoplasm of lung, malignant ([place]) 2002   Non-small cell surgical tx  . Osteoarthritis 11/14/2013   2010 intra-articular steroids X 3 S/P Physical Therapy in 12/14 TKR 12/11/13   . Osteopenia    Dr Pamala Hurry  . PONV (postoperative nausea and vomiting)    n/v  . Sleep apnea   . Vitamin D deficiency     Past Surgical History:  Procedure Laterality Date  . ABDOMINAL HYSTERECTOMY  1998   No BSO, for dysfunctional menses  . BLADDER SUSPENSION Bilateral   . BREAST BIOPSY Right   . COLONOSCOPY  2009 , 2014   Diverticulosis; Dr. Olevia Perches  . LOBECTOMY  12/02   RU; no radiation or chemo, Dr. Arlyce Dice  . TONSILLECTOMY    . TOTAL KNEE ARTHROPLASTY Right 12/11/2013   Procedure: RIGHT TOTAL KNEE ARTHROPLASTY;  Surgeon: Gearlean Alf, MD;  Location: [facility];  Service: Orthopedics;  Laterality: Right;  . TOTAL KNEE ARTHROPLASTY Left 06/28/2017   Procedure: LEFT TOTAL KNEE ARTHROPLASTY;  Surgeon: Gaynelle Arabian, MD;  Location: [facility];  Service: Orthopedics;   Laterality: Left;  Adductor Block    There were no vitals filed for this visit.  Subjective Assessment - 04/24/19 0809    Subjective  I feel tired no pain really, just a typical day.    Currently in Pain?  No/denies        [facility] Adult PT Treatment/Exercise - 04/24/19 0001      Lumbar Exercises: Stretches   Active Hamstring Stretch  3 reps    Active Hamstring Stretch Limitations  uses strap     Single Knee to Chest Stretch  3 reps;30 seconds    Lower Trunk Rotation  10 seconds    Lower Trunk Rotation Limitations  x 10       Lumbar Exercises: Aerobic   Nustep  6 min L7 UE and LE       Lumbar Exercises: Standing   Row  Strengthening;Both;20 reps;Theraband    Theraband Level (Row)  Level 2 (Red)    Shoulder Extension  Strengthening;Both;15 reps    Theraband Level (Shoulder Extension)  Level 2 (Red)      Lumbar Exercises: Supine   Clam  20 reps    Clam Limitations  alternating green band x 10     Bridge  10 reps  Bridge Limitations  then bridge with horizontal pull red x 10    3 rest breaks in between    Other Supine Lumbar Exercises  horizontal abd red x 10       Knee/Hip Exercises: Sidelying   Hip ABduction  Strengthening;Both;1 set;10 reps    Clams  x 10 green band each side              PT Education - 04/24/19 0821    Education Details  posture, final HEP    Person(s) Educated  Patient    Methods  Explanation;Handout    Comprehension  Verbalized understanding          PT Long Term Goals - 04/19/19 0956      PT LONG TERM GOAL #1   Title  I with advanced HEP    Status  On-going      PT LONG TERM GOAL #2   Title  improve FOTO =/< 44% limited     Status  Achieved      PT LONG TERM GOAL #3   Title  report =/> 50% reduction of pain after taking her dog on her 2 mile walk (pain 3/10-4/10)    Status  Partially Met      PT LONG TERM GOAL #4   Title  improve bilat hip abduction strength =/> 4+/5 to allow for no obvious trendelenburg with gait (  02/23/2019)     Status  On-going            Plan - 04/24/19 0825    Clinical Impression Statement  Patient continues to do well, she would like to be on hold for a few weeks to see if she can be consistent with HEP.  She has improved with strength and function.  She continues to have min pain overall with ADLs, walking but is motivated and understands need to do daily exercise.    PT Treatment/Interventions  Balance training;Neuromuscular re-education;Passive range of motion;Dry needling;Manual techniques;Ultrasound;Cryotherapy;Electrical Stimulation;Moist Heat;Therapeutic exercise;Therapeutic activities;Patient/family education;Taping    PT Next Visit Plan  check in with HEP, gait, posture, cont    PT Home Exercise Plan  clam, reverse clam, standing ext, baby cobra, hamstring stretch, clam with bands, bridge , staggered stance with head turns, tandem stance with UE touch, doorway stretch,, standing hip abduction, row and shoulder extension     Consulted and Agree with Plan of Care  Patient       Patient will benefit from skilled therapeutic intervention in order to improve the following deficits and impairments:  Abnormal gait, Pain, Postural dysfunction, Increased muscle spasms, Decreased strength, Difficulty walking  Visit Diagnosis: 1. Muscle weakness (generalized)   2. Abnormal posture   3. Chronic bilateral low back pain without sciatica        Problem List Patient Active Problem List   Diagnosis Date Noted  . Educated About Covid-19 Virus Infection 03/27/2019  . History of total knee replacement, left 11/04/2018  . Preventative health care 06/16/2016  . Knee pain 06/16/2016  . OSA (obstructive sleep apnea) 06/15/2016  . Depression 05/14/2016  . Multiple thyroid nodules 11/24/2015  . Degenerative arthritis of lumbar spine 09/03/2014  . Hyponatremia 12/12/2013  . Hypokalemia 12/12/2013  . OA (osteoarthritis) of knee 12/11/2013  . Osteoarthritis 11/14/2013  . BENIGN  POSITIONAL VERTIGO 01/17/2010  . Urinary incontinence 04/26/2009  . Hyperlipidemia, mixed 02/04/2009  . Vitamin D deficiency 10/23/2008  . POLYARTHRALGIA 10/23/2008  . Osteoporosis 10/23/2008  . NEOPLASM, MALIGNANT, LUNG, NON-SMALL CELL 11/21/2007  .  DIVERTICULOSIS 06/15/2007  . GOITER, NODULAR 04/14/2007    PAA,JENNIFER 04/24/2019, 8:51 AM  [facility] [address] [place], [place], [address] Phone: [phone]   Fax:  [phone]  Name: Maria [name] MRN: [id number] Date of Birth: 1936/08/27  Raeford Razor, PT 04/24/19 8:52 AM Phone: [phone] Fax: [phone]

## 2019-04-24 NOTE — Patient Instructions (Signed)
Resisted Horizontal Abduction: Bilateral    Sit or stand, tubing in both hands, arms out in front. Keeping arms straight, pinch shoulder blades together and stretch arms out. Repeat __10__ times per set. Do __2__ sets per session. Do ___2_ sessions per day. RED BAND , LIE ON YOUR BACK   http://orth.exer.us/969   Copyright  VHI. All rights reserved.

## 2019-05-14 ENCOUNTER — Encounter: Payer: Self-pay | Admitting: Family Medicine

## 2019-05-15 ENCOUNTER — Ambulatory Visit: Payer: Medicare Other | Admitting: Physical Therapy

## 2019-05-15 ENCOUNTER — Ambulatory Visit: Payer: Self-pay

## 2019-05-15 NOTE — Telephone Encounter (Signed)
Outgoing call to Patient, who states that she was around some one who tested positive  for covid-19. Requested of  to be tested.  Reviewed protocol.  Recommended Patient contact Provider if Sx developed .  Patient voices understanding.           [name] Female, 83 y.o., Sep 06, 1936 MRN:  [id number] Phone:  [phone] [name]) PCP:  Mosie Lukes, MD Primary Cvg:  [employer] Next Appt With Rehabilitation 06/02/2019 at 10:45 AM Message from Loma Boston sent at 05/15/2019 3:24 PM EDT  Summary: COVID testing   pls call pt back about possible COVID exposure at [phone] (probable testing)         Call History   Type Contact Phone User  05/15/2019 03:22 PM EDT Phone (Incoming) Maria [name] (Self)  Loma Boston    Reason for Disposition . [1] COVID-19 EXPOSURE (Close Contact) AND [2] within last 14 days BUT [3] NO symptoms  Answer Assessment - Initial Assessment Questions 1. COVID-19 DIAGNOSIS: "Who made your Coronavirus (COVID-19) diagnosis?" "Was it confirmed by a positive lab test?" If not diagnosed by a HCP, ask "Are there lots of cases (community spread) where you live?" (See public health department website, if unsure)     July distance 2. ONSET: "When did the COVID-19 symptoms start?"       3. WORST SYMPTOM: "What is your worst symptom?" (e.g., cough, fever, shortness of breath, muscle aches)    denies 4. COUGH: "Do you have a cough?" If so, ask: "How bad is the cough?"       denies 5. FEVER: "Do you have a fever?" If so, ask: "What is your temperature, how was it measured, and when did it start?"     denies 6. RESPIRATORY STATUS: "Describe your breathing?" (e.g., shortness of breath, wheezing, unable to speak)      denies 7. BETTER-SAME-WORSE: "Are you getting better, staying the same or getting worse compared to yesterday?"  If getting worse, ask, "In what way?"     The same 8. HIGH RISK DISEASE: "Do you  have any chronic medical problems?" (e.g., asthma, heart or lung disease, weak immune system, etc.)     denies 9. PREGNANCY: "Is there any chance you are pregnant?" "When was your last menstrual period?"     na 10. OTHER SYMPTOMS: "Do you have any other symptoms?"  (e.g., chills, fatigue, headache, loss of smell or taste, muscle pain, sore throat)       denies  Protocols used: CORONAVIRUS (COVID-19) EXPOSURE-A-AH, CORONAVIRUS (COVID-19) DIAGNOSED OR SUSPECTED-A-AH

## 2019-06-02 ENCOUNTER — Ambulatory Visit: Payer: Medicare Other | Admitting: Physical Therapy

## 2019-06-05 ENCOUNTER — Ambulatory Visit: Payer: Medicare Other | Attending: Family Medicine | Admitting: Physical Therapy

## 2019-06-05 ENCOUNTER — Other Ambulatory Visit: Payer: Self-pay

## 2019-06-05 ENCOUNTER — Encounter: Payer: Self-pay | Admitting: Physical Therapy

## 2019-06-05 DIAGNOSIS — G8929 Other chronic pain: Secondary | ICD-10-CM | POA: Diagnosis present

## 2019-06-05 DIAGNOSIS — R293 Abnormal posture: Secondary | ICD-10-CM | POA: Diagnosis present

## 2019-06-05 DIAGNOSIS — M545 Low back pain, unspecified: Secondary | ICD-10-CM

## 2019-06-05 DIAGNOSIS — M6281 Muscle weakness (generalized): Secondary | ICD-10-CM | POA: Diagnosis not present

## 2019-06-05 NOTE — Therapy (Addendum)
[place], [place], [address] Phone: [phone]   Fax:  [phone]  Physical Therapy Treatment/Re-Evaluation /DC  Patient Details  Name: Maria [name] MRN: [id number] Date of Birth: [birth year] Referring Provider (PT): Dr. Hulan Saas    Encounter Date: 06/05/2019  PT End of Session - 06/05/19 1053    Visit Number  14    Number of Visits  22    Date for PT Re-Evaluation  07/07/19    PT Start Time  2505    PT Stop Time  1130    PT Time Calculation (min)  45 min    Activity Tolerance  Patient tolerated treatment well    Behavior During Therapy  [facility] for tasks assessed/performed       Past Medical History:  Diagnosis Date  . Complication of anesthesia    slow to awaken  . Depression   . Diverticulosis   . Goiter, nodular    [place] of thyroid stable 7/08  . Headache    hx of  . Heart murmur    due to rheumatic fever as child  . Mitral valve prolapse   . Multiple thyroid nodules 11/24/2015  . Neoplasm of lung, malignant ([place]) 2002   Non-small cell surgical tx  . Osteoarthritis 11/14/2013   2010 intra-articular steroids X 3 S/P Physical Therapy in 12/14 TKR 12/11/13   . Osteopenia    Dr Pamala Hurry  . PONV (postoperative nausea and vomiting)    n/v  . Sleep apnea   . Vitamin D deficiency     Past Surgical History:  Procedure Laterality Date  . ABDOMINAL HYSTERECTOMY  1998   No BSO, for dysfunctional menses  . BLADDER SUSPENSION Bilateral   . BREAST BIOPSY Right   . COLONOSCOPY  2009 , 2014   Diverticulosis; Dr. Olevia Perches  . LOBECTOMY  12/02   RU; no radiation or chemo, Dr. Arlyce Dice  . TONSILLECTOMY    . TOTAL KNEE ARTHROPLASTY Right 12/11/2013   Procedure: RIGHT TOTAL KNEE ARTHROPLASTY;  Surgeon: Gearlean Alf, MD;  Location: [facility];  Service: Orthopedics;  Laterality: Right;  . TOTAL KNEE ARTHROPLASTY Left 06/28/2017   Procedure: LEFT TOTAL KNEE ARTHROPLASTY;  Surgeon: Gaynelle Arabian, MD;   Location: [facility];  Service: Orthopedics;  Laterality: Left;  Adductor Block    There were no vitals filed for this visit.  Subjective Assessment - 06/05/19 1048    Subjective  It feels like my back is not going to support me.  I'm better in alot of ways.  She wants to know "what's going on in there". I don't want to come here until ive seen him .  I had a "wretched fall" in the driveway, the other day.  Tripped on some roots, hit her head and she ended up on her back. She has no significant back pain unless she walks or stands for a period of time.  Weakness is her main symptoms.         [facility] PT Assessment - 06/05/19 0001      AROM   Lumbar Flexion  NT     Lumbar Extension  can get 10-20 deg past neutral     Lumbar - Right Side Bend  50%    Lumbar - Left Side Bend  50%    Lumbar - Right Rotation  stiff 25% , pain     Lumbar - Left Rotation  stiff 25% , pain       Strength  Right Hip Flexion  4+/5    Left Hip Flexion  4+/5    Left Hip ABduction  3-/5    Right/Left Knee  --   [facility]    Right Ankle Dorsiflexion  5/5    Left Ankle Dorsiflexion  5/5          [facility] Adult PT Treatment/Exercise - 06/05/19 0001      Ambulation/Gait   Gait Pattern  Trendelenburg;Antalgic;Trunk flexed    Pre-Gait Activities  unable to walk in parallel bars with arms overhead    Gait Comments  worked on gait with holding a dowel to encouraged more thoracic extension (overhead, behind neck, behind back and dowel at shoulders)       Lumbar Exercises: Standing   Row  Strengthening;Both;20 reps;Theraband    Theraband Level (Row)  Level 4 (Blue)    Row Limitations  high     Shoulder Extension  Strengthening;Both;15 reps    Theraband Level (Shoulder Extension)  Level 4 (Blue)    Shoulder Extension Limitations  cues for posture     Other Standing Lumbar Exercises  standing at wall, UE lift unilateral and bilateral     Other Standing Lumbar Exercises  standing cane chest press, overhead lift x 10 each  with dowel       Knee/Hip Exercises: Stretches   Active Hamstring Stretch  Both;2 reps;30 seconds      Knee/Hip Exercises: Supine   Bridges  Strengthening;Both;1 set;10 reps    Bridges with Clamshell  Strengthening;Both;1 set;10 reps      Knee/Hip Exercises: Sidelying   Hip ABduction  Strengthening;Both;1 set;10 reps    Clams  x10 no band                   PT Long Term Goals - 06/05/19 1054      PT LONG TERM GOAL #1   Title  I with advanced HEP    Status  On-going      PT LONG TERM GOAL #2   Title  improve FOTO =/< 44% limited     Status  Achieved      PT LONG TERM GOAL #3   Title  report =/> 50% reduction of pain after taking her dog on her 2 mile walk (pain 3/10-4/10)    Status  Partially Met      PT LONG TERM GOAL #4   Title  improve bilat hip abduction strength =/> 4+/5 to allow for no obvious trendelenburg with gait ( 02/23/2019)             Plan - 06/05/19 1240    Clinical Impression Statement  Patient has not taken a break from PT, wanted to be on hold for awhile.  She has continued to walk, do aquatic exercises and her HEP.  She is concerned that there is another issue in her spine she does not know about. She would like to see MD prior to continuing PT and eventually get into the Pilates studio.    Personal Factors and Comorbidities  Age;Time since onset of injury/illness/exacerbation;Comorbidity 3+    Comorbidities  bilat TKA, osteopenia in the spine, heart dx, h/o lung CA yrs ago, OA, bladder tacking.     Examination-Activity Limitations  Stairs;Locomotion Level    Examination-Participation Restrictions  Laundry;Community Activity;Yard Work    Stability/Clinical Decision Making  Evolving/Moderate complexity    Rehab Potential  Good    PT Frequency  2x / week    PT Treatment/Interventions  Balance training;Neuromuscular re-education;Passive  range of motion;Dry needling;Manual techniques;Ultrasound;Cryotherapy;Electrical Stimulation;Moist  Heat;Therapeutic exercise;Therapeutic activities;Patient/family education;Taping    PT Next Visit Plan  MD appt, MRI? cont upright gait, mid back strengthening    PT Home Exercise Plan  clam, reverse clam, standing ext, baby cobra, hamstring stretch, clam with bands, bridge , staggered stance with head turns, tandem stance with UE touch, doorway stretch,, standing hip abduction, row and shoulder extension     Consulted and Agree with Plan of Care  Patient       Patient will benefit from skilled therapeutic intervention in order to improve the following deficits and impairments:  Abnormal gait, Pain, Postural dysfunction, Increased muscle spasms, Decreased strength, Difficulty walking  Visit Diagnosis: 1. Muscle weakness (generalized)   2. Abnormal posture   3. Chronic bilateral low back pain without sciatica        Problem List Patient Active Problem List   Diagnosis Date Noted  . Educated About Covid-19 Virus Infection 03/27/2019  . History of total knee replacement, left 11/04/2018  . Preventative health care 06/16/2016  . Knee pain 06/16/2016  . OSA (obstructive sleep apnea) 06/15/2016  . Depression 05/14/2016  . Multiple thyroid nodules 11/24/2015  . Degenerative arthritis of lumbar spine 09/03/2014  . Hyponatremia 12/12/2013  . Hypokalemia 12/12/2013  . OA (osteoarthritis) of knee 12/11/2013  . Osteoarthritis 11/14/2013  . BENIGN POSITIONAL VERTIGO 01/17/2010  . Urinary incontinence 04/26/2009  . Hyperlipidemia, mixed 02/04/2009  . Vitamin D deficiency 10/23/2008  . POLYARTHRALGIA 10/23/2008  . Osteoporosis 10/23/2008  . NEOPLASM, MALIGNANT, LUNG, NON-SMALL CELL 11/21/2007  . DIVERTICULOSIS 06/15/2007  . GOITER, NODULAR 04/14/2007    Seamus Warehime 06/05/2019, 1:40 PM  [facility] [address] [place], [place], [address] Phone: [phone]   Fax:  [phone]  Name: Maria [name] MRN: [id number] Date of  Birth: Jan 15, 1936  Raeford Razor, PT 06/05/19 1:40 PM Phone: [phone] Fax: [phone]  PHYSICAL THERAPY DISCHARGE SUMMARY  Visits from Start of Care: 14  Current functional level related to goals / functional outcomes: See above    Remaining deficits: See above    Education / Equipment: HEP, POC   Plan: Patient agrees to discharge.  Patient goals were met. Patient is being discharged due to not returning since the last visit.  ?????    Raeford Razor, PT 08/21/19 3:18 PM Phone: [phone] Fax: [phone]

## 2019-06-28 ENCOUNTER — Ambulatory Visit: Payer: Medicare Other | Admitting: Family Medicine

## 2019-07-07 ENCOUNTER — Other Ambulatory Visit: Payer: Self-pay

## 2019-07-07 ENCOUNTER — Ambulatory Visit (INDEPENDENT_AMBULATORY_CARE_PROVIDER_SITE_OTHER): Payer: Medicare Other | Admitting: Family Medicine

## 2019-07-07 ENCOUNTER — Encounter: Payer: Self-pay | Admitting: Family Medicine

## 2019-07-07 ENCOUNTER — Other Ambulatory Visit (INDEPENDENT_AMBULATORY_CARE_PROVIDER_SITE_OTHER): Payer: Medicare Other

## 2019-07-07 VITALS — BP 156/90 | HR 101 | Ht 66.0 in | Wt 175.0 lb

## 2019-07-07 DIAGNOSIS — M2550 Pain in unspecified joint: Secondary | ICD-10-CM

## 2019-07-07 DIAGNOSIS — M545 Low back pain, unspecified: Secondary | ICD-10-CM

## 2019-07-07 DIAGNOSIS — M47896 Other spondylosis, lumbar region: Secondary | ICD-10-CM | POA: Diagnosis not present

## 2019-07-07 LAB — CBC WITH DIFFERENTIAL/PLATELET
Basophils Absolute: 0 10*3/uL (ref 0.0–0.1)
Basophils Relative: 0.7 % (ref 0.0–3.0)
Eosinophils Absolute: 0.1 10*3/uL (ref 0.0–0.7)
Eosinophils Relative: 2 % (ref 0.0–5.0)
HCT: 44 % (ref 36.0–46.0)
Hemoglobin: 15.1 g/dL — ABNORMAL HIGH (ref 12.0–15.0)
Lymphocytes Relative: 44.2 % (ref 12.0–46.0)
Lymphs Abs: 3.1 10*3/uL (ref 0.7–4.0)
MCHC: 34.2 g/dL (ref 30.0–36.0)
MCV: 91.5 fl (ref 78.0–100.0)
Monocytes Absolute: 0.6 10*3/uL (ref 0.1–1.0)
Monocytes Relative: 9.3 % (ref 3.0–12.0)
Neutro Abs: 3 10*3/uL (ref 1.4–7.7)
Neutrophils Relative %: 43.8 % (ref 43.0–77.0)
Platelets: 248 10*3/uL (ref 150.0–400.0)
RBC: 4.81 Mil/uL (ref 3.87–5.11)
RDW: 12.8 % (ref 11.5–15.5)
WBC: 6.9 10*3/uL (ref 4.0–10.5)

## 2019-07-07 LAB — IBC PANEL
Iron: 200 ug/dL — ABNORMAL HIGH (ref 42–145)
Saturation Ratios: 70.7 % — ABNORMAL HIGH (ref 20.0–50.0)
Transferrin: 202 mg/dL — ABNORMAL LOW (ref 212.0–360.0)

## 2019-07-07 LAB — LIPID PANEL
Cholesterol: 205 mg/dL — ABNORMAL HIGH (ref 0–200)
HDL: 64.7 mg/dL (ref >39.00)
LDL Cholesterol: 115 mg/dL — ABNORMAL HIGH (ref 0–99)
NonHDL: 140.15
Total CHOL/HDL Ratio: 3
Triglycerides: 128 mg/dL (ref 0.0–149.0)
VLDL: 25.6 mg/dL (ref 0.0–40.0)

## 2019-07-07 LAB — TSH: TSH: 2.31 u[IU]/mL (ref 0.35–4.50)

## 2019-07-07 LAB — COMPREHENSIVE METABOLIC PANEL
ALT: 19 U/L (ref 0–35)
AST: 23 U/L (ref 0–37)
Albumin: 4.2 g/dL (ref 3.5–5.2)
Alkaline Phosphatase: 84 U/L (ref 39–117)
BUN: 22 mg/dL (ref 6–23)
CO2: 27 mEq/L (ref 19–32)
Calcium: 9.6 mg/dL (ref 8.4–10.5)
Chloride: 100 mEq/L (ref 96–112)
Creatinine, Ser: 0.74 mg/dL (ref 0.40–1.20)
GFR: 75.21 mL/min (ref >60.00)
Glucose, Bld: 133 mg/dL — ABNORMAL HIGH (ref 70–99)
Potassium: 3.5 mEq/L (ref 3.5–5.1)
Sodium: 137 mEq/L (ref 135–145)
Total Bilirubin: 0.8 mg/dL (ref 0.2–1.2)
Total Protein: 7 g/dL (ref 6.0–8.3)

## 2019-07-07 LAB — URIC ACID: Uric Acid, Serum: 6.9 mg/dL (ref 2.4–7.0)

## 2019-07-07 LAB — C-REACTIVE PROTEIN: CRP: <1 mg/dL (ref 0.5–20.0)

## 2019-07-07 LAB — SEDIMENTATION RATE: Sed Rate: 13 mm/hr (ref 0–30)

## 2019-07-07 LAB — FERRITIN: Ferritin: 816.4 ng/mL — ABNORMAL HIGH (ref 10.0–291.0)

## 2019-07-07 NOTE — Assessment & Plan Note (Signed)
Significant arthritic changes with significant progression with most recent x-rays.  Patient signs and symptoms is concerning for more of a spinal stenosis.  With patient's history of also lung cancer at this time I do feel advanced imaging is warranted.  MRI ordered.  Patient is failed all other conservative therapy including formal physical therapy and medications.  We discussed laboratory work-up as well to make sure there is nothing else that could be contributing to the slow healing properties.  After labs as well as MRI we will discuss further treatment options including epidurals.  Spent  25 minutes with patient face-to-face and had greater than 50% of counseling including as described above in assessment and plan.

## 2019-07-07 NOTE — Progress Notes (Signed)
[facility] [place], [place] [address] Phone: [phone] Subjective:      CC: Back pain follow-up  IDP:[id number]  Maria [name] is a 83 y.o. female coming in with complaint of back pain. States she does not feel good today. Believes she will be getting an MRI.  Patient's x-rays show the patient does have significant progression of the advanced osteoarthritic changes.  Patient is having pain that seems to be going down both legs.  Has gone to formal physical therapy multiple times and has felt that she is given a valiant effort but continues to have this pain that can sometimes stop her from moving daily activities such as going upstairs sometimes feels like she is not comfortable walking long distances because of the pain.  Noticing more of a weakness and numbness in certain things such as even getting up from a seated position has become more difficult    Past Medical History:  Diagnosis Date  . Complication of anesthesia    slow to awaken  . Depression   . Diverticulosis   . Goiter, nodular    [place] of thyroid stable 7/08  . Headache    hx of  . Heart murmur    due to rheumatic fever as child  . Mitral valve prolapse   . Multiple thyroid nodules 11/24/2015  . Neoplasm of lung, malignant ([place]) 2002   Non-small cell surgical tx  . Osteoarthritis 11/14/2013   2010 intra-articular steroids X 3 S/P Physical Therapy in 12/14 TKR 12/11/13   . Osteopenia    Dr Pamala Hurry  . PONV (postoperative nausea and vomiting)    n/v  . Sleep apnea   . Vitamin D deficiency    Past Surgical History:  Procedure Laterality Date  . ABDOMINAL HYSTERECTOMY  1998   No BSO, for dysfunctional menses  . BLADDER SUSPENSION Bilateral   . BREAST BIOPSY Right   . COLONOSCOPY  2009 , 2014   Diverticulosis; Dr. Olevia Perches  . LOBECTOMY  12/02   RU; no radiation or chemo, Dr. Arlyce Dice  . TONSILLECTOMY    . TOTAL KNEE ARTHROPLASTY Right 12/11/2013   Procedure: RIGHT  TOTAL KNEE ARTHROPLASTY;  Surgeon: Gearlean Alf, MD;  Location: [facility];  Service: Orthopedics;  Laterality: Right;  . TOTAL KNEE ARTHROPLASTY Left 06/28/2017   Procedure: LEFT TOTAL KNEE ARTHROPLASTY;  Surgeon: Gaynelle Arabian, MD;  Location: [facility];  Service: Orthopedics;  Laterality: Left;  Adductor Block   Social History   Socioeconomic History  . Marital status: Divorced    Spouse name: Not on file  . Number of children: Not on file  . Years of education: college  . Highest education level: Not on file  Occupational History  . Occupation: Retired    Fish farm manager: RETIRED  Social Needs  . Financial resource strain: Not on file  . Food insecurity    Worry: Not on file    Inability: Not on file  . Transportation needs    Medical: Not on file    Non-medical: Not on file  Tobacco Use  . Smoking status: Former Smoker    Years: 20.00    Types: Cigarettes    Quit date: 05/03/1979    Years since quitting: 40.2  . Smokeless tobacco: Never Used  . Tobacco comment: smoked 1962-1980, up to < 1 ppd  Substance and Sexual Activity  . Alcohol use: Yes    Alcohol/week: 7.0 standard drinks    Types: 7 Standard  drinks or equivalent per week    Comment: occasional  . Drug use: No  . Sexual activity: Never    Comment: lives alone, dog, no dietary restrictions, eating heart healthy  Lifestyle  . Physical activity    Days per week: Not on file    Minutes per session: Not on file  . Stress: Not on file  Relationships  . Social Herbalist on phone: Not on file    Gets together: Not on file    Attends religious service: Not on file    Active member of club or organization: Not on file    Attends meetings of clubs or organizations: Not on file    Relationship status: Not on file  Other Topics Concern  . Not on file  Social History Narrative   Pt gets regular exercise.   Drinks 2 cups of coffee a day    Allergies  Allergen Reactions  . Oxycodone     Mental status changes post  op 12/11/13  . Sulfonamide Derivatives     REACTION: stomach pain   Family History  Problem Relation Age of Onset  . COPD Mother   . Coronary artery disease Father   . Colon cancer Father 21  . Heart attack Father 12  . Stroke Father        > 93  . Lung cancer Paternal Aunt        smoker  . Cancer Paternal Aunt        lung  . Lung cancer Maternal Grandfather        smoker  . COPD Maternal Grandfather   . Arthritis Sister   . Lung cancer Maternal Uncle        smoker  . Diabetes Neg Hx        Current Outpatient Medications (Analgesics):  .  traMADol (ULTRAM) 50 MG tablet, TAKE 1 TABLET BY MOUTH TWICE A DAY AS NEEDED  Current Outpatient Medications (Hematological):  .  folic acid (FOLVITE) 1 MG tablet, Take 1 mg by mouth daily. .  vitamin B-12 (CYANOCOBALAMIN) 500 MCG tablet, Take 500 mcg by mouth daily.  Current Outpatient Medications (Other):  .  cholecalciferol (VITAMIN D) 1000 units tablet, Take 1,000 Units by mouth daily. .  DULoxetine (CYMBALTA) 30 MG capsule, TAKE 1 CAPSULE BY MOUTH EVERY DAY .  polyethylene glycol powder (MIRALAX) powder, Take 0.5 Containers by mouth every other day.    Past medical history, social, surgical and family history all reviewed in electronic medical record.  No pertanent information unless stated regarding to the chief complaint.   Review of Systems:  No headache, visual changes, nausea, vomiting, diarrhea, constipation, dizziness, abdominal pain, skin rash, fevers, chills, night sweats, weight loss, swollen lymph nodes, body aches, joint swelling, chest pain, shortness of breath, mood changes.  Positive muscle aches  Objective  Blood pressure (!) 156/90, pulse (!) 101, height '5\' 6"'$  (1.676 m), weight 175 lb (79.4 kg), SpO2 97 %.     General: No apparent distress alert and oriented x3 mood and affect normal, dressed appropriately.  HEENT: Pupils equal, extraocular movements intact  Respiratory: Patient's speak in full sentences and  does not appear short of breath  Cardiovascular: Trace lower extremity edema, non tender, no erythema  Skin: Warm dry intact with no signs of infection or rash on extremities or on axial skeleton.  Abdomen: Soft nontender  Neuro: Cranial nerves II through XII are intact, neurovascularly intact in all extremities with 2+ DTRs  and 2+ pulses.  Lymph: No lymphadenopathy of posterior or anterior cervical chain or axillae bilaterally.  Gait normal with good balance and coordination.  MSK:  tender with full range of motion and good stability and symmetric strength and tone of shoulders, elbows, wrist, hip, knee and ankles bilaterally.  Arthritic changes of multiple joints Neck exam has severe loss of lordosis with severe degenerative scoliosis.  Patient's lower extremities seem to have some mild atrophy of the quadriceps and still first time we have met her.  Significant tightness with hamstrings bilaterally.  Worsening pain with extension of the back.  Patient has limited range of motion in all planes but near full flexion of the back.  Seems to be neurovascularly intact distally.  Deep tendon reflexes intact    Impression and Recommendations:     This case required medical decision making of moderate complexity. The above documentation has been reviewed and is accurate and complete Lyndal Pulley, DO       Note: This dictation was prepared with Dragon dictation along with smaller phrase technology. Any transcriptional errors that result from this process are unintentional.

## 2019-07-07 NOTE — Patient Instructions (Signed)
Good to see you [facility] [phone] Labs downstairs  Will call you about labs

## 2019-07-09 ENCOUNTER — Other Ambulatory Visit: Payer: Self-pay | Admitting: Family Medicine

## 2019-07-12 LAB — VITAMIN D 1,25 DIHYDROXY
Vitamin D 1, 25 (OH)2 Total: 53 pg/mL (ref 18–72)
Vitamin D2 1, 25 (OH)2: <8 pg/mL
Vitamin D3 1, 25 (OH)2: 53 pg/mL

## 2019-07-12 LAB — ANGIOTENSIN CONVERTING ENZYME: Angiotensin-Converting Enzyme: 40 U/L (ref 9–67)

## 2019-07-12 LAB — PTH, INTACT AND CALCIUM
Calcium: 9.8 mg/dL (ref 8.6–10.4)
PTH: 27 pg/mL (ref 14–64)

## 2019-07-12 LAB — CALCIUM, IONIZED: Calcium, Ion: 5.2 mg/dL (ref 4.8–5.6)

## 2019-07-12 LAB — RHEUMATOID FACTOR: Rheumatoid fact SerPl-aCnc: <14 IU/mL (ref <14)

## 2019-07-12 LAB — CYCLIC CITRUL PEPTIDE ANTIBODY, IGG: Cyclic Citrullin Peptide Ab: <16 UNITS

## 2019-07-12 LAB — ANTI-NUCLEAR AB-TITER (ANA TITER): ANA Titer 1: 1:40 {titer} — ABNORMAL HIGH

## 2019-07-12 LAB — ANA: Anti Nuclear Antibody (ANA): POSITIVE — AB

## 2019-08-03 ENCOUNTER — Other Ambulatory Visit: Payer: Self-pay

## 2019-08-03 ENCOUNTER — Ambulatory Visit
Admission: RE | Admit: 2019-08-03 | Discharge: 2019-08-03 | Disposition: A | Discharge status: Home / Self Care | Payer: Medicare Other | Source: Ambulatory Visit | Attending: Family Medicine | Admitting: Family Medicine

## 2019-08-03 DIAGNOSIS — M545 Low back pain, unspecified: Secondary | ICD-10-CM

## 2019-08-08 ENCOUNTER — Encounter: Payer: Self-pay | Admitting: Family Medicine

## 2019-08-10 ENCOUNTER — Telehealth: Payer: Self-pay

## 2019-08-10 ENCOUNTER — Telehealth: Payer: Self-pay | Admitting: Hematology & Oncology

## 2019-08-10 ENCOUNTER — Telehealth: Payer: Self-pay | Admitting: *Deleted

## 2019-08-10 DIAGNOSIS — R790 Abnormal level of blood mineral: Secondary | ICD-10-CM

## 2019-08-10 NOTE — Telephone Encounter (Signed)
pCopied from [place] [id number]. Topic: General - Other >> Aug 10, 2019 11:05 AM Maria [name] wrote: Pt called back and said she would like to see a Hematology asap and would like a call back as to when this is done    Please advise

## 2019-08-10 NOTE — Telephone Encounter (Signed)
Appointments scheduled and patient is aware of our location/

## 2019-08-10 NOTE — Telephone Encounter (Signed)
Patient notified that referral has been placed and that they will call her to schedule appointment.

## 2019-08-10 NOTE — Telephone Encounter (Signed)
Copied from [place] [phone]. Topic: General - Other >> Aug 10, 2019 11:05 AM Timmie Stare wrote: Pt called back and said she would like to see a Hematology asap and would like a call back as to when this is done

## 2019-08-15 ENCOUNTER — Other Ambulatory Visit: Payer: Self-pay

## 2019-08-15 ENCOUNTER — Ambulatory Visit (INDEPENDENT_AMBULATORY_CARE_PROVIDER_SITE_OTHER): Payer: Medicare Other

## 2019-08-15 DIAGNOSIS — Z23 Encounter for immunization: Secondary | ICD-10-CM

## 2019-08-25 ENCOUNTER — Other Ambulatory Visit: Payer: Self-pay | Admitting: Family

## 2019-08-25 DIAGNOSIS — D751 Secondary polycythemia: Secondary | ICD-10-CM

## 2019-08-28 ENCOUNTER — Inpatient Hospital Stay (HOSPITAL_BASED_OUTPATIENT_CLINIC_OR_DEPARTMENT_OTHER): Payer: Medicare Other | Admitting: Family

## 2019-08-28 ENCOUNTER — Other Ambulatory Visit: Payer: Self-pay

## 2019-08-28 ENCOUNTER — Inpatient Hospital Stay: Payer: Medicare Other | Attending: Hematology & Oncology

## 2019-08-28 ENCOUNTER — Encounter: Payer: Self-pay | Admitting: Family

## 2019-08-28 DIAGNOSIS — Z79899 Other long term (current) drug therapy: Secondary | ICD-10-CM | POA: Diagnosis not present

## 2019-08-28 DIAGNOSIS — E049 Nontoxic goiter, unspecified: Secondary | ICD-10-CM | POA: Diagnosis not present

## 2019-08-28 DIAGNOSIS — E559 Vitamin D deficiency, unspecified: Secondary | ICD-10-CM | POA: Insufficient documentation

## 2019-08-28 DIAGNOSIS — M1990 Unspecified osteoarthritis, unspecified site: Secondary | ICD-10-CM | POA: Diagnosis not present

## 2019-08-28 DIAGNOSIS — M549 Dorsalgia, unspecified: Secondary | ICD-10-CM | POA: Insufficient documentation

## 2019-08-28 DIAGNOSIS — M8580 Other specified disorders of bone density and structure, unspecified site: Secondary | ICD-10-CM | POA: Insufficient documentation

## 2019-08-28 DIAGNOSIS — F329 Major depressive disorder, single episode, unspecified: Secondary | ICD-10-CM | POA: Insufficient documentation

## 2019-08-28 DIAGNOSIS — G8929 Other chronic pain: Secondary | ICD-10-CM | POA: Diagnosis not present

## 2019-08-28 DIAGNOSIS — I341 Nonrheumatic mitral (valve) prolapse: Secondary | ICD-10-CM | POA: Insufficient documentation

## 2019-08-28 DIAGNOSIS — Z87891 Personal history of nicotine dependence: Secondary | ICD-10-CM | POA: Diagnosis not present

## 2019-08-28 DIAGNOSIS — D751 Secondary polycythemia: Secondary | ICD-10-CM

## 2019-08-28 LAB — CBC WITH DIFFERENTIAL (CANCER CENTER ONLY)
Abs Immature Granulocytes: 0.02 10*3/uL (ref 0.00–0.07)
Basophils Absolute: 0.1 10*3/uL (ref 0.0–0.1)
Basophils Relative: 1 %
Eosinophils Absolute: 0.1 10*3/uL (ref 0.0–0.5)
Eosinophils Relative: 2 %
HCT: 44.3 % (ref 36.0–46.0)
Hemoglobin: 14.9 g/dL (ref 12.0–15.0)
Immature Granulocytes: 0 %
Lymphocytes Relative: 37 %
Lymphs Abs: 2.4 10*3/uL (ref 0.7–4.0)
MCH: 31.2 pg (ref 26.0–34.0)
MCHC: 33.6 g/dL (ref 30.0–36.0)
MCV: 92.7 fL (ref 80.0–100.0)
Monocytes Absolute: 0.8 10*3/uL (ref 0.1–1.0)
Monocytes Relative: 12 %
Neutro Abs: 3.1 10*3/uL (ref 1.7–7.7)
Neutrophils Relative %: 48 %
Platelet Count: 272 10*3/uL (ref 150–400)
RBC: 4.78 MIL/uL (ref 3.87–5.11)
RDW: 12.5 % (ref 11.5–15.5)
WBC Count: 6.4 10*3/uL (ref 4.0–10.5)
nRBC: 0 % (ref 0.0–0.2)

## 2019-08-28 LAB — CMP (CANCER CENTER ONLY)
ALT: 20 U/L (ref 0–44)
AST: 25 U/L (ref 15–41)
Albumin: 4.5 g/dL (ref 3.5–5.0)
Alkaline Phosphatase: 87 U/L (ref 38–126)
Anion gap: 6 (ref 5–15)
BUN: 19 mg/dL (ref 8–23)
CO2: 33 mmol/L — ABNORMAL HIGH (ref 22–32)
Calcium: 9.8 mg/dL (ref 8.9–10.3)
Chloride: 100 mmol/L (ref 98–111)
Creatinine: 0.77 mg/dL (ref 0.44–1.00)
GFR, Est AFR Am: >60 mL/min (ref >60)
GFR, Estimated: >60 mL/min (ref >60)
Glucose, Bld: 92 mg/dL (ref 70–99)
Potassium: 4.7 mmol/L (ref 3.5–5.1)
Sodium: 139 mmol/L (ref 135–145)
Total Bilirubin: 0.8 mg/dL (ref 0.3–1.2)
Total Protein: 7 g/dL (ref 6.5–8.1)

## 2019-08-28 LAB — LACTATE DEHYDROGENASE: LDH: 219 U/L — ABNORMAL HIGH (ref 98–192)

## 2019-08-28 LAB — IRON AND TIBC
Iron: 247 ug/dL — ABNORMAL HIGH (ref 41–142)
Saturation Ratios: 99 % — ABNORMAL HIGH (ref 21–57)
TIBC: 249 ug/dL (ref 236–444)
UIBC: 1 ug/dL — ABNORMAL LOW (ref 120–384)

## 2019-08-28 LAB — RETICULOCYTES
Immature Retic Fract: 9 % (ref 2.3–15.9)
RBC.: 4.8 MIL/uL (ref 3.87–5.11)
Retic Count, Absolute: 79.2 10*3/uL (ref 19.0–186.0)
Retic Ct Pct: 1.7 % (ref 0.4–3.1)

## 2019-08-28 LAB — FERRITIN: Ferritin: 675 ng/mL — ABNORMAL HIGH (ref 11–307)

## 2019-08-28 NOTE — Progress Notes (Signed)
Hematology/Oncology Consultation   Name: Maria [name]      MRN: [id number]    Location: Room/bed info not found  Date: 08/28/2019 Time:9:19 AM   REFERRING PHYSICIAN: Penni Homans, MD   REASON FOR CONSULT:  Raised serum iron   DIAGNOSIS: Raised serum iron   HISTORY OF PRESENT ILLNESS: Maria [name] is a very pleasant 83 yo caucasian female with a recent Ferritin at 816, total iron 100 and saturation 70%.  She states that she remembers being told year ago that her iron was high then as well.  No family history that she is aware of.  She has had some fatigue.  She has severe osteoarthritis which is quite bothersome. She has chronic back pain with this and associated kyphosis.  No swelling, tenderness, numbness or tingling in her extremities.  She had lung cancer in 2002 while living is [place], [place]. She had her right upper lobe removed and states that she did not require chemotherapy. She was followed by Dr. Inda Merlin.  She had been a prior smoker, < 1 ppd, and quit in her 5's.  Her maternal grandfather had history of lung cancer and her father had colon cancer.  She had her colonoscopies regularly and states all were negative. She no longer has them due to age.  She states that she is due for her mammogram in February 2021.  She has lived here in the [place] area for the last 6 years to be close to her grandchildren.  She had a hysterectomy in at 89 yo and had been on low dose HRT at that time.   She has had a murmur since childhood due to rheumatic fever.  No fever, chills, n/v, cough, rash, dizziness, SOB, chest pain, palpitations, abdominal pain or changes in bladder habits.  She has had issues with constipation and takes Miralax daily.  She has had some depression due to Covid/being cooped up and also losing her best friend in September. This has been hard for her.  She has maintained a good appetite and is staying well hydrated. She is preparing to start using the Noom diet.  Weight is described as stable.  She does have one vodka tonic at 5 o'clock eat day.  She enjoys walking for exercise but her back pain can make this difficult.   ROS: All other 10 point review of systems is negative.   PAST MEDICAL HISTORY:   Past Medical History:  Diagnosis Date  . Complication of anesthesia    slow to awaken  . Depression   . Diverticulosis   . Goiter, nodular    [place] of thyroid stable 7/08  . Headache    hx of  . Heart murmur    due to rheumatic fever as child  . Mitral valve prolapse   . Multiple thyroid nodules 11/24/2015  . Neoplasm of lung, malignant ([place]) 2002   Non-small cell surgical tx  . Osteoarthritis 11/14/2013   2010 intra-articular steroids X 3 S/P Physical Therapy in 12/14 TKR 12/11/13   . Osteopenia    Dr Pamala Hurry  . PONV (postoperative nausea and vomiting)    n/v  . Sleep apnea   . Vitamin D deficiency     ALLERGIES: Allergies  Allergen Reactions  . Oxycodone     Mental status changes post op 12/11/13  . Sulfonamide Derivatives     REACTION: stomach pain      MEDICATIONS:  Current Outpatient Medications on File Prior to Visit  Medication Sig Dispense Refill  .  cholecalciferol (VITAMIN D) 1000 units tablet Take 1,000 Units by mouth daily.    . DULoxetine (CYMBALTA) 30 MG capsule TAKE 1 CAPSULE BY MOUTH EVERY DAY 90 capsule 1  . folic acid (FOLVITE) 1 MG tablet Take 1 mg by mouth daily.    . polyethylene glycol powder (MIRALAX) powder Take 0.5 Containers by mouth every other day.    . traMADol (ULTRAM) 50 MG tablet TAKE 1 TABLET BY MOUTH TWICE A DAY AS NEEDED 30 tablet 0  . vitamin B-12 (CYANOCOBALAMIN) 500 MCG tablet Take 500 mcg by mouth daily.     No current facility-administered medications on file prior to visit.      PAST SURGICAL HISTORY Past Surgical History:  Procedure Laterality Date  . ABDOMINAL HYSTERECTOMY  1998   No BSO, for dysfunctional menses  . BLADDER SUSPENSION Bilateral   . BREAST BIOPSY Right   . COLONOSCOPY   2009 , 2014   Diverticulosis; Dr. Olevia Perches  . LOBECTOMY  12/02   RU; no radiation or chemo, Dr. Arlyce Dice  . TONSILLECTOMY    . TOTAL KNEE ARTHROPLASTY Right 12/11/2013   Procedure: RIGHT TOTAL KNEE ARTHROPLASTY;  Surgeon: Gearlean Alf, MD;  Location: [facility];  Service: Orthopedics;  Laterality: Right;  . TOTAL KNEE ARTHROPLASTY Left 06/28/2017   Procedure: LEFT TOTAL KNEE ARTHROPLASTY;  Surgeon: Gaynelle Arabian, MD;  Location: [facility];  Service: Orthopedics;  Laterality: Left;  Adductor Block    FAMILY HISTORY: Family History  Problem Relation Age of Onset  . COPD Mother   . Coronary artery disease Father   . Colon cancer Father 90  . Heart attack Father 20  . Stroke Father        > 6  . Lung cancer Paternal Aunt        smoker  . Cancer Paternal Aunt        lung  . Lung cancer Maternal Grandfather        smoker  . COPD Maternal Grandfather   . Arthritis Sister   . Lung cancer Maternal Uncle        smoker  . Diabetes Neg Hx     SOCIAL HISTORY:  reports that she quit smoking about 40 years ago. Her smoking use included cigarettes. She quit after 20.00 years of use. She has never used smokeless tobacco. She reports current alcohol use of about 7.0 standard drinks of alcohol per week. She reports that she does not use drugs.  PERFORMANCE STATUS: The patient's performance status is 1 - Symptomatic but completely ambulatory  PHYSICAL EXAM: Most Recent Vital Signs: There were no vitals taken for this visit. There were no vitals taken for this visit.  General Appearance:    Alert, cooperative, no distress, appears stated age  Head:    Normocephalic, without obvious abnormality, atraumatic  Eyes:    PERRL, conjunctiva/corneas clear, EOM's intact, fundi    benign, both eyes        Throat:   Lips, mucosa, and tongue normal; teeth and gums normal  Neck:   Supple, symmetrical, trachea midline, no adenopathy;    thyroid:  no enlargement/tenderness/nodules; no carotid   bruit or JVD   Back:     Symmetric, no curvature, ROM normal, no CVA tenderness  Lungs:     Clear to auscultation bilaterally, respirations unlabored  Chest Wall:    No tenderness or deformity   Heart:    Regular rate and rhythm, S1 and S2 normal, no murmur, rub   or gallop  Abdomen:     Soft, non-tender, bowel sounds active all four quadrants,    no masses, no organomegaly        Extremities:   Extremities normal, atraumatic, no cyanosis or edema  Pulses:   2+ and symmetric all extremities  Skin:   Skin color, texture, turgor normal, no rashes or lesions  Lymph nodes:   Cervical, supraclavicular, and axillary nodes normal  Neurologic:   CNII-XII intact, normal strength, sensation and reflexes    throughout    LABORATORY DATA:  Results for orders placed or performed in visit on 08/28/19 (from the past 48 hour(s))  CBC with Differential ([place] Only)     Status: None   Collection Time: 08/28/19  8:54 AM  Result Value Ref Range   WBC Count 6.4 4.0 - 10.5 K/uL   RBC 4.78 3.87 - 5.11 MIL/uL   Hemoglobin 14.9 12.0 - 15.0 g/dL   HCT 44.3 36.0 - 46.0 %   MCV 92.7 80.0 - 100.0 fL   MCH 31.2 26.0 - 34.0 pg   MCHC 33.6 30.0 - 36.0 g/dL   RDW 12.5 11.5 - 15.5 %   Platelet Count 272 150 - 400 K/uL   nRBC 0.0 0.0 - 0.2 %   Neutrophils Relative % 48 %   Neutro Abs 3.1 1.7 - 7.7 K/uL   Lymphocytes Relative 37 %   Lymphs Abs 2.4 0.7 - 4.0 K/uL   Monocytes Relative 12 %   Monocytes Absolute 0.8 0.1 - 1.0 K/uL   Eosinophils Relative 2 %   Eosinophils Absolute 0.1 0.0 - 0.5 K/uL   Basophils Relative 1 %   Basophils Absolute 0.1 0.0 - 0.1 K/uL   Immature Granulocytes 0 %   Abs Immature Granulocytes 0.02 0.00 - 0.07 K/uL    Comment: Performed at [facility] at [facility], [address] [place], [place] [address]  Reticulocytes     Status: None   Collection Time: 08/28/19  8:55 AM  Result Value Ref Range   Retic Ct Pct 1.7 0.4 - 3.1 %   RBC. 4.80 3.87 - 5.11  MIL/uL   Retic Count, Absolute 79.2 19.0 - 186.0 K/uL   Immature Retic Fract 9.0 2.3 - 15.9 %    Comment: Performed at [facility] at [facility], [address], [place], [place] [address]      RADIOGRAPHY: No results found.     PATHOLOGY: None  ASSESSMENT/PLAN: Maria [name] is a very pleasant 83 yo caucasian female with history of elevated iron counts. Hgb remains stable at 14.9, MCV 92.7% and platelets 272.  We did an extensive lab work up on her today including repeat iron studies and hemochromatosis DNA.  We will see what these show and determine if an intervention is needed. Once we have her results we will schedule her follow-up.   All questions were answered and she is in agreement with the plan. She will contact our office with any questions or concerns. We can certainly see her sooner if needed.   She was discussed with and also seen by Dr. Marin Olp and he is in agreement with the aforementioned.   [name], NP    Addendum: I saw and examined Maria [name] with Judson Roch.  I agree with the above assessment.  I have to believe that she is going to have hemochromatosis.  Her iron saturation was 99%.  Her ferritin was 675.  We actually do  have back the genetic assay already.  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  I am sure that she has other family members that have hemochromatosis.  We really have to get her phlebotomized.  It will take several phlebotomies to get her iron level back down.  Given that she is 83 years old, I am not sure we can be all that aggressive and do weekly phlebotomies.  I probably would do every 2-week phlebotomies.  I would probably recheck her in about 6 weeks and we can see how her iron levels are and see how her iron saturation looks.  I told her that she cannot take vitamin C supplements.  She cannot take vitamins with iron.  Otherwise, she can do what she likes and eat what she likes.  I do not see any  problems with her having wine if she would like.  We spent about 45 with her.  She is very nice.  She is fun to talk to.  We answered all of her questions.  We explained what hemochromatosis is.  We told her that the best treatment is to phlebotomize her if she does have hemochromatosis.  She understands this.  I will plan to see her back in 6 weeks.  We will be interested in her iron studies at that time.  Lattie Haw, MD  We spent about

## 2019-08-31 LAB — HEMOCHROMATOSIS DNA-PCR(C282Y,H63D)

## 2019-09-01 ENCOUNTER — Telehealth: Payer: Self-pay | Admitting: *Deleted

## 2019-09-01 ENCOUNTER — Encounter: Payer: Self-pay | Admitting: Family

## 2019-09-01 HISTORY — DX: Hereditary hemochromatosis: E83.110

## 2019-09-01 NOTE — Telephone Encounter (Signed)
Message received from patient stating that she is returning call from S. [place] NP.  Call placed back to patient to notify her that Judson Roch has left the office for the day and will call her back next week with results.  Pt appreciative of call.

## 2019-09-04 ENCOUNTER — Telehealth: Payer: Self-pay | Admitting: Family

## 2019-09-04 NOTE — Telephone Encounter (Signed)
Called and LMVM regarding appointments per 10/30 sch msg

## 2019-09-04 NOTE — Telephone Encounter (Signed)
-----   Message from [place], RN sent at 09/01/2019  3:23 PM EDT ----- Regarding: Results Hi Maria [name],  Maria [name] returned your call after you left on Friday.  I told her you would call her back next week.  Her number is [phone].  Thanks! Maria [name]

## 2019-09-04 NOTE — Telephone Encounter (Signed)
I was able to speak with Maria [name] and go over her lab work in details with her particularly the new diagnosis of hemochromatosis. We will get her on a phlebotomy program, 1 phlebotomy every other week for 4 treatments and follow-up in 6 weeks.  She will avoid eating lots of iron rich foods and not take any vitamin C supplements.  All questions were answered and she verbalized understanding.

## 2019-09-06 ENCOUNTER — Other Ambulatory Visit: Payer: Self-pay

## 2019-09-06 ENCOUNTER — Inpatient Hospital Stay: Payer: Medicare Other | Attending: Hematology & Oncology

## 2019-09-06 ENCOUNTER — Other Ambulatory Visit: Payer: Self-pay | Admitting: Family

## 2019-09-06 NOTE — Progress Notes (Signed)
[name] presents today for phlebotomy per MD orders. Phlebotomy procedure started at 1405 and ended at 1415. 509 grams removed via 16 gauge needle to left AC. Patient observed for 30 minutes after procedure without any incident. Patient tolerated procedure well. IV needle removed intact. Pt given post procedure snack and hydration.

## 2019-09-06 NOTE — Patient Instructions (Signed)
Therapeutic Phlebotomy Therapeutic phlebotomy is the planned removal of blood from a person's body for the purpose of treating a medical condition. The procedure is similar to donating blood. Usually, about a pint (470 mL, or 0.47 L) of blood is removed. The average adult has 9-12 pints (4.3-5.7 L) of blood in the body. Therapeutic phlebotomy may be used to treat the following medical conditions:  Hemochromatosis. This is a condition in which the blood contains too much iron.  Polycythemia vera. This is a condition in which the blood contains too many red blood cells.  Porphyria cutanea tarda. This is a disease in which an important part of hemoglobin is not made properly. It results in the buildup of abnormal amounts of porphyrins in the body.  Sickle cell disease. This is a condition in which the red blood cells form an abnormal crescent shape rather than a round shape. Tell a health care provider about:  Any allergies you have.  All medicines you are taking, including vitamins, herbs, eye drops, creams, and over-the-counter medicines.  Any problems you or family members have had with anesthetic medicines.  Any blood disorders you have.  Any surgeries you have had.  Any medical conditions you have.  Whether you are pregnant or may be pregnant. What are the risks? Generally, this is a safe procedure. However, problems may occur, including:  Nausea or light-headedness.  Low blood pressure (hypotension).  Soreness, bleeding, swelling, or bruising at the needle insertion site.  Infection. What happens before the procedure?  Follow instructions from your health care provider about eating or drinking restrictions.  Ask your health care provider about: ? Changing or stopping your regular medicines. This is especially important if you are taking diabetes medicines or blood thinners (anticoagulants). ? Taking medicines such as aspirin and ibuprofen. These medicines can thin your  blood. Do not take these medicines unless your health care provider tells you to take them. ? Taking over-the-counter medicines, vitamins, herbs, and supplements.  Wear clothing with sleeves that can be raised above the elbow.  Plan to have someone take you home from the hospital or clinic.  You may have a blood sample taken.  Your blood pressure, pulse rate, and breathing rate will be measured. What happens during the procedure?   To lower your risk of infection: ? Your health care team will wash or sanitize their hands. ? Your skin will be cleaned with an antiseptic.  You may be given a medicine to numb the area (local anesthetic).  A tourniquet will be placed on your arm.  A needle will be inserted into one of your veins.  Tubing and a collection bag will be attached to that needle.  Blood will flow through the needle and tubing into the collection bag.  The collection bag will be placed lower than your arm to allow gravity to help the flow of blood into the bag.  You may be asked to open and close your hand slowly and continually during the entire collection.  After the specified amount of blood has been removed from your body, the collection bag and tubing will be clamped.  The needle will be removed from your vein.  Pressure will be held on the site of the needle insertion to stop the bleeding.  A bandage (dressing) will be placed over the needle insertion site. The procedure may vary among health care providers and hospitals. What happens after the procedure?  Your blood pressure, pulse rate, and breathing rate will be   measured after the procedure.  You will be encouraged to drink fluids.  Your recovery will be assessed and monitored.  You can return to your normal activities as told by your health care provider. Summary  Therapeutic phlebotomy is the planned removal of blood from a person's body for the purpose of treating a medical condition.  Therapeutic  phlebotomy may be used to treat hemochromatosis, polycythemia vera, porphyria cutanea tarda, or sickle cell disease.  In the procedure, a needle is inserted and about a pint (470 mL, or 0.47 L) of blood is removed. The average adult has 9-12 pints (4.3-5.7 L) of blood in the body.  This is generally a safe procedure, but it can sometimes cause problems such as nausea, light-headedness, or low blood pressure (hypotension). This information is not intended to replace advice given to you by your health care provider. Make sure you discuss any questions you have with your health care provider. Document Released: 03/23/2011 Document Revised: 11/04/2017 Document Reviewed: 11/04/2017 Elsevier Patient Education  2020 [employer]  

## 2019-09-07 ENCOUNTER — Encounter: Payer: Self-pay | Admitting: Family Medicine

## 2019-09-07 ENCOUNTER — Other Ambulatory Visit: Payer: Self-pay | Admitting: Family Medicine

## 2019-09-07 NOTE — Telephone Encounter (Signed)
Requesting:tramadol  Contract:no  UDS:n/a Last OV:03/24/19 Next OV:03/05/20 Last Refill:03/24/19  #[id number] Database:   Please advise

## 2019-09-13 ENCOUNTER — Encounter (HOSPITAL_COMMUNITY): Payer: Self-pay

## 2019-09-13 ENCOUNTER — Other Ambulatory Visit: Payer: Self-pay

## 2019-09-13 ENCOUNTER — Emergency Department (HOSPITAL_COMMUNITY)
Admission: EM | Admit: 2019-09-13 | Discharge: 2019-09-13 | Disposition: A | Discharge status: Home / Self Care | Payer: Medicare Other | Attending: Emergency Medicine | Admitting: Emergency Medicine

## 2019-09-13 ENCOUNTER — Ambulatory Visit: Payer: Self-pay

## 2019-09-13 DIAGNOSIS — R519 Headache, unspecified: Secondary | ICD-10-CM | POA: Diagnosis not present

## 2019-09-13 DIAGNOSIS — Z87891 Personal history of nicotine dependence: Secondary | ICD-10-CM | POA: Insufficient documentation

## 2019-09-13 DIAGNOSIS — Z96652 Presence of left artificial knee joint: Secondary | ICD-10-CM | POA: Insufficient documentation

## 2019-09-13 DIAGNOSIS — Z85118 Personal history of other malignant neoplasm of bronchus and lung: Secondary | ICD-10-CM | POA: Insufficient documentation

## 2019-09-13 DIAGNOSIS — Z79899 Other long term (current) drug therapy: Secondary | ICD-10-CM | POA: Insufficient documentation

## 2019-09-13 DIAGNOSIS — I10 Essential (primary) hypertension: Secondary | ICD-10-CM | POA: Diagnosis present

## 2019-09-13 LAB — CBC WITH DIFFERENTIAL/PLATELET
Abs Immature Granulocytes: 0.02 10*3/uL (ref 0.00–0.07)
Basophils Absolute: 0 10*3/uL (ref 0.0–0.1)
Basophils Relative: 0 %
Eosinophils Absolute: 0.1 10*3/uL (ref 0.0–0.5)
Eosinophils Relative: 1 %
HCT: 44.7 % (ref 36.0–46.0)
Hemoglobin: 14.6 g/dL (ref 12.0–15.0)
Immature Granulocytes: 0 %
Lymphocytes Relative: 39 %
Lymphs Abs: 3.2 10*3/uL (ref 0.7–4.0)
MCH: 31 pg (ref 26.0–34.0)
MCHC: 32.7 g/dL (ref 30.0–36.0)
MCV: 94.9 fL (ref 80.0–100.0)
Monocytes Absolute: 0.7 10*3/uL (ref 0.1–1.0)
Monocytes Relative: 9 %
Neutro Abs: 4.1 10*3/uL (ref 1.7–7.7)
Neutrophils Relative %: 51 %
Platelets: 275 10*3/uL (ref 150–400)
RBC: 4.71 MIL/uL (ref 3.87–5.11)
RDW: 13 % (ref 11.5–15.5)
WBC: 8.1 10*3/uL (ref 4.0–10.5)
nRBC: 0 % (ref 0.0–0.2)

## 2019-09-13 LAB — BASIC METABOLIC PANEL
Anion gap: 10 (ref 5–15)
BUN: 18 mg/dL (ref 8–23)
CO2: 27 mmol/L (ref 22–32)
Calcium: 9.2 mg/dL (ref 8.9–10.3)
Chloride: 101 mmol/L (ref 98–111)
Creatinine, Ser: 0.72 mg/dL (ref 0.44–1.00)
GFR calc Af Amer: >60 mL/min (ref >60)
GFR calc non Af Amer: >60 mL/min (ref >60)
Glucose, Bld: 93 mg/dL (ref 70–99)
Potassium: 3.7 mmol/L (ref 3.5–5.1)
Sodium: 138 mmol/L (ref 135–145)

## 2019-09-13 MED ORDER — AMLODIPINE BESYLATE 5 MG PO TABS: 5.0000 mg | ORAL_TABLET | Freq: Every day | ORAL | 0 refills | Status: DC

## 2019-09-13 NOTE — Telephone Encounter (Signed)
Patient called with new onset of hypertension.  Today BP 162/109 left arm and 149/112 right arm.  She state that she has noted her BP elevating over several days.  She states that she is treated for a condition at a clinic and they have noted that her BP has been elevated. She states she has a headache and feels tired.  She states that when she walks her dog she feels more winded but is not SOB with normal activity. She has never been prescribed medication for hypertension. Care advice read to patient.  She verbalized understanding Call placed to office for scheduling. Per office patient will go to ER for evaluation of symptoms.  Reason for Disposition . Systolic BP  >= 480 OR Diastolic >= 165  Answer Assessment - Initial Assessment Questions 1. BLOOD PRESSURE: "What is the blood pressure?" "Did you take at least two measurements 5 minutes apart?"     162/109 left , 149/112 rt arm, 2. ONSET: "When did you take your blood pressure?"     today 3. HOW: "How did you obtain the blood pressure?" (e.g., visiting nurse, automatic home BP monitor)     home 4. HISTORY: "Do you have a history of high blood pressure?"     no 5. MEDICATIONS: "Are you taking any medications for blood pressure?" "Have you missed any doses recently?"    Not on medication 6. OTHER SYMPTOMS: "Do you have any symptoms?" (e.g., headache, chest pain, blurred vision, difficulty breathing, weakness)    Headache, some SOB when walking dog, tired 7. PREGNANCY: "Is there any chance you are pregnant?" "When was your last menstrual period?"    N/A  Protocols used: HIGH BLOOD PRESSURE-A-AH

## 2019-09-13 NOTE — ED Triage Notes (Signed)
Patient c/o hypertension (149/112) and headache. Patient states, "I just don't feel well." Patient called her physician and was told to come to the Ed. Patient does not have a history of hypertension.

## 2019-09-13 NOTE — Telephone Encounter (Signed)
FYI. Pt going to ED.

## 2019-09-13 NOTE — ED Provider Notes (Signed)
[facility] DEPT Provider Note   CSN: [id number] Arrival date & time: 09/13/19  1049     History   Chief Complaint Chief Complaint  Patient presents with  . Headache  . Hypertension    HPI Maria [name] is a 83 y.o. female.     83 year old female presents with increased blood pressure times several days.  States that blood pressure at home is anywhere from the systolics of 536R 443X with diastolics in the 54M 086P.  She has had a mild headache without visual changes.  No new weakness in her arms or legs.  No chest pain or shortness of breath.  Called her doctor and was told to come here.  She has no prior history of hypertension.     Past Medical History:  Diagnosis Date  . Complication of anesthesia    slow to awaken  . Depression   . Diverticulosis   . Goiter, nodular    [place] of thyroid stable 7/08  . Headache    hx of  . Heart murmur    due to rheumatic fever as child  . Hemochromatosis, hereditary ([place]) 09/01/2019  . Mitral valve prolapse   . Multiple thyroid nodules 11/24/2015  . Neoplasm of lung, malignant ([place]) 2002   Non-small cell surgical tx  . Osteoarthritis 11/14/2013   2010 intra-articular steroids X 3 S/P Physical Therapy in 12/14 TKR 12/11/13   . Osteopenia    Dr Pamala Hurry  . PONV (postoperative nausea and vomiting)    n/v  . Sleep apnea   . Vitamin D deficiency     Patient Active Problem List   Diagnosis Date Noted  . Hemochromatosis 09/06/2019  . Hemochromatosis, hereditary ([place]) 09/01/2019  . Educated about COVID-19 virus infection 03/27/2019  . History of total knee replacement, left 11/04/2018  . Preventative health care 06/16/2016  . Knee pain 06/16/2016  . OSA (obstructive sleep apnea) 06/15/2016  . Depression 05/14/2016  . Multiple thyroid nodules 11/24/2015  . Degenerative arthritis of lumbar spine 09/03/2014  . Hyponatremia 12/12/2013  . Hypokalemia 12/12/2013  . OA (osteoarthritis) of knee  12/11/2013  . Osteoarthritis 11/14/2013  . BENIGN POSITIONAL VERTIGO 01/17/2010  . Urinary incontinence 04/26/2009  . Hyperlipidemia, mixed 02/04/2009  . Vitamin D deficiency 10/23/2008  . POLYARTHRALGIA 10/23/2008  . Osteoporosis 10/23/2008  . NEOPLASM, MALIGNANT, LUNG, NON-SMALL CELL 11/21/2007  . DIVERTICULOSIS 06/15/2007  . GOITER, NODULAR 04/14/2007    Past Surgical History:  Procedure Laterality Date  . ABDOMINAL HYSTERECTOMY  1998   No BSO, for dysfunctional menses  . BLADDER SUSPENSION Bilateral   . BREAST BIOPSY Right   . COLONOSCOPY  2009 , 2014   Diverticulosis; Dr. Olevia Perches  . LOBECTOMY  12/02   RU; no radiation or chemo, Dr. Arlyce Dice  . TONSILLECTOMY    . TOTAL KNEE ARTHROPLASTY Right 12/11/2013   Procedure: RIGHT TOTAL KNEE ARTHROPLASTY;  Surgeon: Gearlean Alf, MD;  Location: [facility];  Service: Orthopedics;  Laterality: Right;  . TOTAL KNEE ARTHROPLASTY Left 06/28/2017   Procedure: LEFT TOTAL KNEE ARTHROPLASTY;  Surgeon: Gaynelle Arabian, MD;  Location: [facility];  Service: Orthopedics;  Laterality: Left;  Adductor Block     OB History   No obstetric history on file.      Home Medications    Prior to Admission medications   Medication Sig Start Date End Date Taking? Authorizing Provider  cholecalciferol (VITAMIN D) 1000 units tablet Take 1,000 Units by mouth daily.    [provider]  DULoxetine (CYMBALTA) 30 MG capsule TAKE 1 CAPSULE BY MOUTH EVERY DAY 07/11/19   Mosie Lukes, MD  folic acid (FOLVITE) 1 MG tablet Take 1 mg by mouth daily.    [provider]  polyethylene glycol powder (MIRALAX) powder Take 0.5 Containers by mouth every other day.    [provider]  traMADol (ULTRAM) 50 MG tablet TAKE 1 TABLET BY MOUTH TWICE A DAY AS NEEDED 09/07/19   Mosie Lukes, MD  vitamin B-12 (CYANOCOBALAMIN) 500 MCG tablet Take 500 mcg by mouth daily.    [provider]    Family History Family History  Problem Relation Age of Onset   . COPD Mother   . Coronary artery disease Father   . Colon cancer Father 71  . Heart attack Father 88  . Stroke Father        > 15  . Lung cancer Paternal Aunt        smoker  . Cancer Paternal Aunt        lung  . Lung cancer Maternal Grandfather        smoker  . COPD Maternal Grandfather   . Arthritis Sister   . Lung cancer Maternal Uncle        smoker  . Diabetes Neg Hx     Social History Social History   Tobacco Use  . Smoking status: Former Smoker    Years: 20.00    Types: Cigarettes    Quit date: 05/03/1979    Years since quitting: 40.3  . Smokeless tobacco: Never Used  . Tobacco comment: smoked 1962-1980, up to < 1 ppd  Substance Use Topics  . Alcohol use: Yes    Alcohol/week: 7.0 standard drinks    Types: 7 Standard drinks or equivalent per week    Comment: occasional  . Drug use: No     Allergies   Oxycodone and Sulfonamide derivatives   Review of Systems Review of Systems  All other systems reviewed and are negative.    Physical Exam Updated Vital Signs BP (!) 157/97   Pulse 87   Temp 98.4 F (36.9 C) (Oral)   Resp 18   Ht 1.676 m (5\' 6" )   Wt 77.1 kg   SpO2 98%   BMI 27.44 kg/m   Physical Exam Vitals signs and nursing note reviewed.  Constitutional:      General: She is not in acute distress.    Appearance: Normal appearance. She is well-developed. She is not toxic-appearing.  HENT:     Head: Normocephalic and atraumatic.  Eyes:     General: Lids are normal.     Conjunctiva/sclera: Conjunctivae normal.     Pupils: Pupils are equal, round, and reactive to light.  Neck:     Musculoskeletal: Normal range of motion and neck supple.     Thyroid: No thyroid mass.     Trachea: No tracheal deviation.  Cardiovascular:     Rate and Rhythm: Normal rate and regular rhythm.     Heart sounds: Normal heart sounds. No murmur. No gallop.   Pulmonary:     Effort: Pulmonary effort is normal. No respiratory distress.     Breath sounds: Normal  breath sounds. No stridor. No decreased breath sounds, wheezing, rhonchi or rales.  Abdominal:     General: Bowel sounds are normal. There is no distension.     Palpations: Abdomen is soft.     Tenderness: There is no abdominal tenderness. There is no rebound.  Musculoskeletal: Normal range of motion.        General: No tenderness.  Skin:    General: Skin is warm and dry.     Findings: No abrasion or rash.  Neurological:     Mental Status: She is alert and oriented to person, place, and time.     GCS: GCS eye subscore is 4. GCS verbal subscore is 5. GCS motor subscore is 6.     Cranial Nerves: No cranial nerve deficit.     Sensory: No sensory deficit.  Psychiatric:        Speech: Speech normal.        Behavior: Behavior normal.      ED Treatments / Results  Labs (all labs ordered are listed, but only abnormal results are displayed) Labs Reviewed  CBC WITH DIFFERENTIAL/PLATELET  BASIC METABOLIC PANEL    EKG None  Radiology No results found.  Procedures Procedures (including critical care time)  Medications Ordered in ED Medications - No data to display   Initial Impression / Assessment and Plan / ED Course  I have reviewed the triage vital signs and the nursing notes.  Pertinent labs & imaging results that were available during my care of the patient were reviewed by me and considered in my medical decision making (see chart for details).        Labs are reassuring here and blood pressure noted.  Will start patient on Norvasc and have her follow-up with her doctor this week for repeat blood pressure check  Final Clinical Impressions(s) / ED Diagnoses   Final diagnoses:  None    ED Discharge Orders    None       Lacretia Leigh, MD 09/13/19 1455

## 2019-09-13 NOTE — Discharge Instructions (Addendum)
Call your doctor today to schedule a follow-up visit for your high blood pressure.  Your labs were reassuring today.

## 2019-09-15 ENCOUNTER — Other Ambulatory Visit: Payer: Self-pay

## 2019-09-15 LAB — JAK2 (INCLUDING V617F AND EXON 12), MPL,& CALR-NEXT GEN SEQ

## 2019-09-20 ENCOUNTER — Inpatient Hospital Stay: Payer: Medicare Other

## 2019-09-20 ENCOUNTER — Ambulatory Visit (INDEPENDENT_AMBULATORY_CARE_PROVIDER_SITE_OTHER): Payer: Medicare Other | Admitting: Family

## 2019-09-20 ENCOUNTER — Encounter: Payer: Self-pay | Admitting: Family

## 2019-09-20 ENCOUNTER — Other Ambulatory Visit: Payer: Self-pay

## 2019-09-20 VITALS — BP 154/90 | HR 95 | Temp 96.7°F | Resp 16 | Ht 66.0 in

## 2019-09-20 DIAGNOSIS — I10 Essential (primary) hypertension: Secondary | ICD-10-CM | POA: Diagnosis not present

## 2019-09-20 DIAGNOSIS — F419 Anxiety disorder, unspecified: Secondary | ICD-10-CM | POA: Diagnosis not present

## 2019-09-20 MED ORDER — AMLODIPINE BESYLATE 5 MG PO TABS: 7.5000 mg | ORAL_TABLET | Freq: Every day | ORAL | 3 refills | Status: DC

## 2019-09-20 NOTE — Patient Instructions (Signed)
Therapeutic Phlebotomy Therapeutic phlebotomy is the planned removal of blood from a person's body for the purpose of treating a medical condition. The procedure is similar to donating blood. Usually, about a pint (470 mL, or 0.47 L) of blood is removed. The average adult has 9-12 pints (4.3-5.7 L) of blood in the body. Therapeutic phlebotomy may be used to treat the following medical conditions:  Hemochromatosis. This is a condition in which the blood contains too much iron.  Polycythemia vera. This is a condition in which the blood contains too many red blood cells.  Porphyria cutanea tarda. This is a disease in which an important part of hemoglobin is not made properly. It results in the buildup of abnormal amounts of porphyrins in the body.  Sickle cell disease. This is a condition in which the red blood cells form an abnormal crescent shape rather than a round shape. Tell a health care provider about:  Any allergies you have.  All medicines you are taking, including vitamins, herbs, eye drops, creams, and over-the-counter medicines.  Any problems you or family members have had with anesthetic medicines.  Any blood disorders you have.  Any surgeries you have had.  Any medical conditions you have.  Whether you are pregnant or may be pregnant. What are the risks? Generally, this is a safe procedure. However, problems may occur, including:  Nausea or light-headedness.  Low blood pressure (hypotension).  Soreness, bleeding, swelling, or bruising at the needle insertion site.  Infection. What happens before the procedure?  Follow instructions from your health care provider about eating or drinking restrictions.  Ask your health care provider about: ? Changing or stopping your regular medicines. This is especially important if you are taking diabetes medicines or blood thinners (anticoagulants). ? Taking medicines such as aspirin and ibuprofen. These medicines can thin your  blood. Do not take these medicines unless your health care provider tells you to take them. ? Taking over-the-counter medicines, vitamins, herbs, and supplements.  Wear clothing with sleeves that can be raised above the elbow.  Plan to have someone take you home from the hospital or clinic.  You may have a blood sample taken.  Your blood pressure, pulse rate, and breathing rate will be measured. What happens during the procedure?   To lower your risk of infection: ? Your health care team will wash or sanitize their hands. ? Your skin will be cleaned with an antiseptic.  You may be given a medicine to numb the area (local anesthetic).  A tourniquet will be placed on your arm.  A needle will be inserted into one of your veins.  Tubing and a collection bag will be attached to that needle.  Blood will flow through the needle and tubing into the collection bag.  The collection bag will be placed lower than your arm to allow gravity to help the flow of blood into the bag.  You may be asked to open and close your hand slowly and continually during the entire collection.  After the specified amount of blood has been removed from your body, the collection bag and tubing will be clamped.  The needle will be removed from your vein.  Pressure will be held on the site of the needle insertion to stop the bleeding.  A bandage (dressing) will be placed over the needle insertion site. The procedure may vary among health care providers and hospitals. What happens after the procedure?  Your blood pressure, pulse rate, and breathing rate will be   measured after the procedure.  You will be encouraged to drink fluids.  Your recovery will be assessed and monitored.  You can return to your normal activities as told by your health care provider. Summary  Therapeutic phlebotomy is the planned removal of blood from a person's body for the purpose of treating a medical condition.  Therapeutic  phlebotomy may be used to treat hemochromatosis, polycythemia vera, porphyria cutanea tarda, or sickle cell disease.  In the procedure, a needle is inserted and about a pint (470 mL, or 0.47 L) of blood is removed. The average adult has 9-12 pints (4.3-5.7 L) of blood in the body.  This is generally a safe procedure, but it can sometimes cause problems such as nausea, light-headedness, or low blood pressure (hypotension). This information is not intended to replace advice given to you by your health care provider. Make sure you discuss any questions you have with your health care provider. Document Released: 03/23/2011 Document Revised: 11/04/2017 Document Reviewed: 11/04/2017 Elsevier Patient Education  2020 [employer]  

## 2019-09-20 NOTE — Patient Instructions (Signed)
Please increase amlodipine to 1.5 tabs once daily.

## 2019-09-20 NOTE — Progress Notes (Signed)
[name] presents today for phlebotomy per MD orders. Phlebotomy procedure started at 1408 and ended at 1422. 512 grams removed via 16 gauge needle to left AC. Patient observed for 30 minutes after procedure without any incident. Patient tolerated procedure well. IV needle removed intact. Pt given post procedure snacks and drink

## 2019-09-20 NOTE — Progress Notes (Signed)
Subjective:    Patient ID: Maria [name], female    DOB: 02-24-1936, 83 y.o.   MRN: [id number]  HPI  Patient is an 83 yr old female who presents today for hospital follow up. ED record is reviewed. She presented with increased blood pressure, mild HA.  She was started on amlodipine for newly diagnosed hypertension. She is tolerating amlodipine without difficulty.   BP Readings from Last 3 Encounters:  09/20/19 (!) 154/90  09/13/19 (!) 173/105  09/06/19 (!) 145/92     She notes some recent anxiety. She states that she has never really had an issue with anxiety before. Not sure if world events are contributing to her anxiety. Lives with her Dog.  Sons are local.  She reports that cymbalta helps her. She is maintained on 30mg  dose. Reports that she is very sensitive to medications and dosages.       Review of Systems See HPI  Past Medical History:  Diagnosis Date  . Complication of anesthesia    slow to awaken  . Depression   . Diverticulosis   . Goiter, nodular    [place] of thyroid stable 7/08  . Headache    hx of  . Heart murmur    due to rheumatic fever as child  . Hemochromatosis, hereditary ([place]) 09/01/2019  . Mitral valve prolapse   . Multiple thyroid nodules 11/24/2015  . Neoplasm of lung, malignant ([place]) 2002   Non-small cell surgical tx  . Osteoarthritis 11/14/2013   2010 intra-articular steroids X 3 S/P Physical Therapy in 12/14 TKR 12/11/13   . Osteopenia    Dr Pamala Hurry  . PONV (postoperative nausea and vomiting)    n/v  . Sleep apnea   . Vitamin D deficiency      Social History   Socioeconomic History  . Marital status: Divorced    Spouse name: Not on file  . Number of children: Not on file  . Years of education: college  . Highest education level: Not on file  Occupational History  . Occupation: Retired    Fish farm manager: RETIRED  Social Needs  . Financial resource strain: Not on file  . Food insecurity    Worry: Not on file    Inability: Not on file   . Transportation needs    Medical: Not on file    Non-medical: Not on file  Tobacco Use  . Smoking status: Former Smoker    Years: 20.00    Types: Cigarettes    Quit date: 05/03/1979    Years since quitting: 40.4  . Smokeless tobacco: Never Used  . Tobacco comment: smoked 1962-1980, up to < 1 ppd  Substance and Sexual Activity  . Alcohol use: Yes    Alcohol/week: 7.0 standard drinks    Types: 7 Standard drinks or equivalent per week    Comment: occasional  . Drug use: No  . Sexual activity: Never    Comment: lives alone, dog, no dietary restrictions, eating heart healthy  Lifestyle  . Physical activity    Days per week: Not on file    Minutes per session: Not on file  . Stress: Not on file  Relationships  . Social Herbalist on phone: Not on file    Gets together: Not on file    Attends religious service: Not on file    Active member of club or organization: Not on file    Attends meetings of clubs or organizations: Not on file  Relationship status: Not on file  . Intimate partner violence    Fear of current or ex partner: Not on file    Emotionally abused: Not on file    Physically abused: Not on file    Forced sexual activity: Not on file  Other Topics Concern  . Not on file  Social History Narrative   Pt gets regular exercise.   Drinks 2 cups of coffee a day     Past Surgical History:  Procedure Laterality Date  . ABDOMINAL HYSTERECTOMY  1998   No BSO, for dysfunctional menses  . BLADDER SUSPENSION Bilateral   . BREAST BIOPSY Right   . COLONOSCOPY  2009 , 2014   Diverticulosis; Dr. Olevia Perches  . LOBECTOMY  12/02   RU; no radiation or chemo, Dr. Arlyce Dice  . TONSILLECTOMY    . TOTAL KNEE ARTHROPLASTY Right 12/11/2013   Procedure: RIGHT TOTAL KNEE ARTHROPLASTY;  Surgeon: Gearlean Alf, MD;  Location: [facility];  Service: Orthopedics;  Laterality: Right;  . TOTAL KNEE ARTHROPLASTY Left 06/28/2017   Procedure: LEFT TOTAL KNEE ARTHROPLASTY;  Surgeon: Gaynelle Arabian, MD;  Location: [facility];  Service: Orthopedics;  Laterality: Left;  Adductor Block    Family History  Problem Relation Age of Onset  . COPD Mother   . Coronary artery disease Father   . Colon cancer Father 26  . Heart attack Father 38  . Stroke Father        > 35  . Lung cancer Paternal Aunt        smoker  . Cancer Paternal Aunt        lung  . Lung cancer Maternal Grandfather        smoker  . COPD Maternal Grandfather   . Arthritis Sister   . Lung cancer Maternal Uncle        smoker  . Diabetes Neg Hx     Allergies  Allergen Reactions  . Oxycodone     Mental status changes post op 12/11/13  . Sulfonamide Derivatives     REACTION: stomach pain    Current Outpatient Medications on File Prior to Visit  Medication Sig Dispense Refill  . acetaminophen (TYLENOL) 325 MG tablet Take 650 mg by mouth every 6 (six) hours as needed for mild pain or headache.    Marland Kitchen amLODipine (NORVASC) 5 MG tablet Take 1 tablet (5 mg total) by mouth daily. 30 tablet 0  . CALCIUM PO Take 2 tablets by mouth 3 (three) times daily.    . DULoxetine (CYMBALTA) 30 MG capsule TAKE 1 CAPSULE BY MOUTH EVERY DAY 90 capsule 1  . ibuprofen (ADVIL) 200 MG tablet Take 200 mg by mouth every 6 (six) hours as needed for headache.    . traMADol (ULTRAM) 50 MG tablet TAKE 1 TABLET BY MOUTH TWICE A DAY AS NEEDED 14 tablet 2   No current facility-administered medications on file prior to visit.     BP (!) 154/90 (BP Location: Right Arm, Patient Position: Sitting, Cuff Size: Small)   Pulse 95   Temp (!) 96.7 F (35.9 C) (Temporal)   Resp 16   Ht 5\' 6"  (1.676 m)   SpO2 99%   BMI 27.44 kg/m       Objective:   Physical Exam Constitutional:      Appearance: She is well-developed.  Cardiovascular:     Rate and Rhythm: Normal rate and regular rhythm.     Heart sounds: Normal heart sounds. No murmur.  Pulmonary:  Effort: Pulmonary effort is normal. No respiratory distress.     Breath sounds: Normal breath  sounds. No wheezing.  Psychiatric:        Behavior: Behavior normal.        Thought Content: Thought content normal.        Judgment: Judgment normal.           Assessment & Plan:   HTN- home bp readings as above.  Note frequent DBP above goal. Will increase amlodipine from 5mg  to 7.5mg .  Anxiety- uncontrolled. Offered to increase her cymbalta from 30mg  to 60 mg but she declines as she feels that dose would be too strong for her. Advised pt to let me know if she changes her mind and she verbalizes understanding.

## 2019-09-22 ENCOUNTER — Other Ambulatory Visit: Payer: Self-pay

## 2019-09-22 DIAGNOSIS — Z20822 Contact with and (suspected) exposure to covid-19: Secondary | ICD-10-CM

## 2019-09-25 LAB — NOVEL CORONAVIRUS, NAA: SARS-CoV-2, NAA: NOT DETECTED

## 2019-10-06 ENCOUNTER — Encounter: Payer: Self-pay | Admitting: Family Medicine

## 2019-10-11 ENCOUNTER — Encounter: Payer: Self-pay | Admitting: *Deleted

## 2019-10-11 ENCOUNTER — Ambulatory Visit: Payer: Medicare Other | Admitting: Family

## 2019-10-11 ENCOUNTER — Other Ambulatory Visit: Payer: Medicare Other

## 2019-10-30 ENCOUNTER — Encounter: Payer: Self-pay | Admitting: Family

## 2019-11-06 ENCOUNTER — Other Ambulatory Visit: Payer: Self-pay

## 2019-11-06 ENCOUNTER — Ambulatory Visit: Payer: Medicare Other | Admitting: Family Medicine

## 2019-11-06 ENCOUNTER — Encounter: Payer: Self-pay | Admitting: Family Medicine

## 2019-11-06 DIAGNOSIS — E559 Vitamin D deficiency, unspecified: Secondary | ICD-10-CM | POA: Diagnosis not present

## 2019-11-06 DIAGNOSIS — E782 Mixed hyperlipidemia: Secondary | ICD-10-CM | POA: Diagnosis not present

## 2019-11-06 DIAGNOSIS — I10 Essential (primary) hypertension: Secondary | ICD-10-CM | POA: Diagnosis not present

## 2019-11-06 NOTE — Patient Instructions (Addendum)
Pulse oximeter want oxygen in 80s   Multivitamin with minerals Vit D 11-1998 IU dailyu Aspirin 81 mg daily Melatonin 1-5 mg at bed  Move Amlodipine to bedtime, 2.5 mg tonight and tomorrow am then 7.5 mg nightly after that     Hypertension, Adult High blood pressure (hypertension) is when the force of blood pumping through the arteries is too strong. The arteries are the blood vessels that carry blood from the heart throughout the body. Hypertension forces the heart to work harder to pump blood and may cause arteries to become narrow or stiff. Untreated or uncontrolled hypertension can cause a heart attack, heart failure, a stroke, kidney disease, and other problems. A blood pressure reading consists of a higher number over a lower number. Ideally, your blood pressure should be below 120/80. The first ("top") number is called the systolic pressure. It is a measure of the pressure in your arteries as your heart beats. The second ("bottom") number is called the diastolic pressure. It is a measure of the pressure in your arteries as the heart relaxes. What are the causes? The exact cause of this condition is not known. There are some conditions that result in or are related to high blood pressure. What increases the risk? Some risk factors for high blood pressure are under your control. The following factors may make you more likely to develop this condition:  Smoking.  Having type 2 diabetes mellitus, high cholesterol, or both.  Not getting enough exercise or physical activity.  Being overweight.  Having too much fat, sugar, calories, or salt (sodium) in your diet.  Drinking too much alcohol. Some risk factors for high blood pressure may be difficult or impossible to change. Some of these factors include:  Having chronic kidney disease.  Having a family history of high blood pressure.  Age. Risk increases with age.  Race. You may be at higher risk if you are African  American.  Gender. Men are at higher risk than women before age 38. After age 51, women are at higher risk than men.  Having obstructive sleep apnea.  Stress. What are the signs or symptoms? High blood pressure may not cause symptoms. Very high blood pressure (hypertensive crisis) may cause:  Headache.  Anxiety.  Shortness of breath.  Nosebleed.  Nausea and vomiting.  Vision changes.  Severe chest pain.  Seizures. How is this diagnosed? This condition is diagnosed by measuring your blood pressure while you are seated, with your arm resting on a flat surface, your legs uncrossed, and your feet flat on the floor. The cuff of the blood pressure monitor will be placed directly against the skin of your upper arm at the level of your heart. It should be measured at least twice using the same arm. Certain conditions can cause a difference in blood pressure between your right and left arms. Certain factors can cause blood pressure readings to be lower or higher than normal for a short period of time:  When your blood pressure is higher when you are in a health care provider's office than when you are at home, this is called white coat hypertension. Most people with this condition do not need medicines.  When your blood pressure is higher at home than when you are in a health care provider's office, this is called masked hypertension. Most people with this condition may need medicines to control blood pressure. If you have a high blood pressure reading during one visit or you have normal blood pressure  with other risk factors, you may be asked to:  Return on a different day to have your blood pressure checked again.  Monitor your blood pressure at home for 1 week or longer. If you are diagnosed with hypertension, you may have other blood or imaging tests to help your health care provider understand your overall risk for other conditions. How is this treated? This condition is treated by  making healthy lifestyle changes, such as eating healthy foods, exercising more, and reducing your alcohol intake. Your health care provider may prescribe medicine if lifestyle changes are not enough to get your blood pressure under control, and if:  Your systolic blood pressure is above 130.  Your diastolic blood pressure is above 80. Your personal target blood pressure may vary depending on your medical conditions, your age, and other factors. Follow these instructions at home: Eating and drinking   Eat a diet that is high in fiber and potassium, and low in sodium, added sugar, and fat. An example eating plan is called the DASH (Dietary Approaches to Stop Hypertension) diet. To eat this way: ? Eat plenty of fresh fruits and vegetables. Try to fill one half of your plate at each meal with fruits and vegetables. ? Eat whole grains, such as whole-wheat pasta, brown rice, or whole-grain bread. Fill about one fourth of your plate with whole grains. ? Eat or drink low-fat dairy products, such as skim milk or low-fat yogurt. ? Avoid fatty cuts of meat, processed or cured meats, and poultry with skin. Fill about one fourth of your plate with lean proteins, such as fish, chicken without skin, beans, eggs, or tofu. ? Avoid pre-made and processed foods. These tend to be higher in sodium, added sugar, and fat.  Reduce your daily sodium intake. Most people with hypertension should eat less than 1,500 mg of sodium a day.  Do not drink alcohol if: ? Your health care provider tells you not to drink. ? You are pregnant, may be pregnant, or are planning to become pregnant.  If you drink alcohol: ? Limit how much you use to:  0-1 drink a day for women.  0-2 drinks a day for men. ? Be aware of how much alcohol is in your drink. In the [place] one drink equals one 12 oz bottle of beer (355 mL), one 5 oz glass of wine (148 mL), or one 1 oz glass of hard liquor (44 mL). Lifestyle   Work with your health  care provider to maintain a healthy body weight or to lose weight. Ask what an ideal weight is for you.  Get at least 30 minutes of exercise most days of the week. Activities may include walking, swimming, or biking.  Include exercise to strengthen your muscles (resistance exercise), such as Pilates or lifting weights, as part of your weekly exercise routine. Try to do these types of exercises for 30 minutes at least 3 days a week.  Do not use any products that contain nicotine or tobacco, such as cigarettes, e-cigarettes, and chewing tobacco. If you need help quitting, ask your health care provider.  Monitor your blood pressure at home as told by your health care provider.  Keep all follow-up visits as told by your health care provider. This is important. Medicines  Take over-the-counter and prescription medicines only as told by your health care provider. Follow directions carefully. Blood pressure medicines must be taken as prescribed.  Do not skip doses of blood pressure medicine. Doing this puts you at  risk for problems and can make the medicine less effective.  Ask your health care provider about side effects or reactions to medicines that you should watch for. Contact a health care provider if you:  Think you are having a reaction to a medicine you are taking.  Have headaches that keep coming back (recurring).  Feel dizzy.  Have swelling in your ankles.  Have trouble with your vision. Get help right away if you:  Develop a severe headache or confusion.  Have unusual weakness or numbness.  Feel faint.  Have severe pain in your chest or abdomen.  Vomit repeatedly.  Have trouble breathing. Summary  Hypertension is when the force of blood pumping through your arteries is too strong. If this condition is not controlled, it may put you at risk for serious complications.  Your personal target blood pressure may vary depending on your medical conditions, your age, and  other factors. For most people, a normal blood pressure is less than 120/80.  Hypertension is treated with lifestyle changes, medicines, or a combination of both. Lifestyle changes include losing weight, eating a healthy, low-sodium diet, exercising more, and limiting alcohol. This information is not intended to replace advice given to you by your health care provider. Make sure you discuss any questions you have with your health care provider. Document Revised: 06/29/2018 Document Reviewed: 06/29/2018 Elsevier Patient Education  2020 [employer].

## 2019-11-07 ENCOUNTER — Inpatient Hospital Stay: Payer: Medicare Other | Attending: Hematology & Oncology

## 2019-11-07 ENCOUNTER — Other Ambulatory Visit: Payer: Self-pay

## 2019-11-07 DIAGNOSIS — Z79899 Other long term (current) drug therapy: Secondary | ICD-10-CM | POA: Diagnosis not present

## 2019-11-07 DIAGNOSIS — M79671 Pain in right foot: Secondary | ICD-10-CM | POA: Insufficient documentation

## 2019-11-07 DIAGNOSIS — M7989 Other specified soft tissue disorders: Secondary | ICD-10-CM | POA: Diagnosis not present

## 2019-11-07 NOTE — Patient Instructions (Signed)
Therapeutic Phlebotomy Therapeutic phlebotomy is the planned removal of blood from a person's body for the purpose of treating a medical condition. The procedure is similar to donating blood. Usually, about a pint (470 mL, or 0.47 L) of blood is removed. The average adult has 9-12 pints (4.3-5.7 L) of blood in the body. Therapeutic phlebotomy may be used to treat the following medical conditions:  Hemochromatosis. This is a condition in which the blood contains too much iron.  Polycythemia vera. This is a condition in which the blood contains too many red blood cells.  Porphyria cutanea tarda. This is a disease in which an important part of hemoglobin is not made properly. It results in the buildup of abnormal amounts of porphyrins in the body.  Sickle cell disease. This is a condition in which the red blood cells form an abnormal crescent shape rather than a round shape. Tell a health care provider about:  Any allergies you have.  All medicines you are taking, including vitamins, herbs, eye drops, creams, and over-the-counter medicines.  Any problems you or family members have had with anesthetic medicines.  Any blood disorders you have.  Any surgeries you have had.  Any medical conditions you have.  Whether you are pregnant or may be pregnant. What are the risks? Generally, this is a safe procedure. However, problems may occur, including:  Nausea or light-headedness.  Low blood pressure (hypotension).  Soreness, bleeding, swelling, or bruising at the needle insertion site.  Infection. What happens before the procedure?  Follow instructions from your health care provider about eating or drinking restrictions.  Ask your health care provider about: ? Changing or stopping your regular medicines. This is especially important if you are taking diabetes medicines or blood thinners (anticoagulants). ? Taking medicines such as aspirin and ibuprofen. These medicines can thin your  blood. Do not take these medicines unless your health care provider tells you to take them. ? Taking over-the-counter medicines, vitamins, herbs, and supplements.  Wear clothing with sleeves that can be raised above the elbow.  Plan to have someone take you home from the hospital or clinic.  You may have a blood sample taken.  Your blood pressure, pulse rate, and breathing rate will be measured. What happens during the procedure?   To lower your risk of infection: ? Your health care team will wash or sanitize their hands. ? Your skin will be cleaned with an antiseptic.  You may be given a medicine to numb the area (local anesthetic).  A tourniquet will be placed on your arm.  A needle will be inserted into one of your veins.  Tubing and a collection bag will be attached to that needle.  Blood will flow through the needle and tubing into the collection bag.  The collection bag will be placed lower than your arm to allow gravity to help the flow of blood into the bag.  You may be asked to open and close your hand slowly and continually during the entire collection.  After the specified amount of blood has been removed from your body, the collection bag and tubing will be clamped.  The needle will be removed from your vein.  Pressure will be held on the site of the needle insertion to stop the bleeding.  A bandage (dressing) will be placed over the needle insertion site. The procedure may vary among health care providers and hospitals. What happens after the procedure?  Your blood pressure, pulse rate, and breathing rate will be   measured after the procedure.  You will be encouraged to drink fluids.  Your recovery will be assessed and monitored.  You can return to your normal activities as told by your health care provider. Summary  Therapeutic phlebotomy is the planned removal of blood from a person's body for the purpose of treating a medical condition.  Therapeutic  phlebotomy may be used to treat hemochromatosis, polycythemia vera, porphyria cutanea tarda, or sickle cell disease.  In the procedure, a needle is inserted and about a pint (470 mL, or 0.47 L) of blood is removed. The average adult has 9-12 pints (4.3-5.7 L) of blood in the body.  This is generally a safe procedure, but it can sometimes cause problems such as nausea, light-headedness, or low blood pressure (hypotension). This information is not intended to replace advice given to you by your health care provider. Make sure you discuss any questions you have with your health care provider. Document Revised: 11/04/2017 Document Reviewed: 11/04/2017 Elsevier Patient Education  2020 [employer] Inc[employer]  

## 2019-11-07 NOTE — Progress Notes (Signed)
[name] presents today for phlebotomy per MD orders. Phlebotomy procedure started at 1320 in left antecubital and ended at 1335. 489 grams removed via 16 g phlebotomy kit Patient observed for 30 minutes after procedure without any incident. Patient tolerated procedure well. IV needle removed intact.

## 2019-11-08 DIAGNOSIS — I10 Essential (primary) hypertension: Secondary | ICD-10-CM | POA: Insufficient documentation

## 2019-11-08 NOTE — Assessment & Plan Note (Signed)
Supplement and monitor 

## 2019-11-08 NOTE — Assessment & Plan Note (Signed)
Encouraged heart healthy diet, increase exercise, avoid trans fats, consider a krill oil cap daily. She declines lab work today will proceed later in the year.

## 2019-11-08 NOTE — Assessment & Plan Note (Signed)
Doing much better on Amlodipine 7.5 mg daily, no changes today. Home log reviewed.

## 2019-11-08 NOTE — Assessment & Plan Note (Signed)
Is tolerating phlebotomy with hematology

## 2019-11-08 NOTE — Progress Notes (Signed)
Subjective:    Patient ID: Maria [name], female    DOB: 19-Jun-1936, 84 y.o.   MRN: [id number]  No chief complaint on file.   HPI Patient is in today for follow up on chronic medical concerns including hypertension, hyperlipidemia and vitamin D deficiency. She is doing well. No recent febrile illness or hospitalizations. She has brought in a blood pressure log and it is reviewed. Her numbers are now much better on Amlodipine. Denies CP/palp/SOB/HA/congestion/fevers/GI or GU c/o. Taking meds as prescribed  Past Medical History:  Diagnosis Date  . Complication of anesthesia    slow to awaken  . Depression   . Diverticulosis   . Goiter, nodular    [place] of thyroid stable 7/08  . Headache    hx of  . Heart murmur    due to rheumatic fever as child  . Hemochromatosis, hereditary ([place]) 09/01/2019  . Mitral valve prolapse   . Multiple thyroid nodules 11/24/2015  . Neoplasm of lung, malignant ([place]) 2002   Non-small cell surgical tx  . Osteoarthritis 11/14/2013   2010 intra-articular steroids X 3 S/P Physical Therapy in 12/14 TKR 12/11/13   . Osteopenia    Dr Pamala Hurry  . PONV (postoperative nausea and vomiting)    n/v  . Sleep apnea   . Vitamin D deficiency     Past Surgical History:  Procedure Laterality Date  . ABDOMINAL HYSTERECTOMY  1998   No BSO, for dysfunctional menses  . BLADDER SUSPENSION Bilateral   . BREAST BIOPSY Right   . COLONOSCOPY  2009 , 2014   Diverticulosis; Dr. Olevia Perches  . LOBECTOMY  12/02   RU; no radiation or chemo, Dr. Arlyce Dice  . TONSILLECTOMY    . TOTAL KNEE ARTHROPLASTY Right 12/11/2013   Procedure: RIGHT TOTAL KNEE ARTHROPLASTY;  Surgeon: Gearlean Alf, MD;  Location: [facility];  Service: Orthopedics;  Laterality: Right;  . TOTAL KNEE ARTHROPLASTY Left 06/28/2017   Procedure: LEFT TOTAL KNEE ARTHROPLASTY;  Surgeon: Gaynelle Arabian, MD;  Location: [facility];  Service: Orthopedics;  Laterality: Left;  Adductor Block    Family History  Problem Relation Age of  Onset  . COPD Mother   . Coronary artery disease Father   . Colon cancer Father 50  . Heart attack Father 4  . Stroke Father        > 88  . Lung cancer Paternal Aunt        smoker  . Cancer Paternal Aunt        lung  . Lung cancer Maternal Grandfather        smoker  . COPD Maternal Grandfather   . Arthritis Sister   . Lung cancer Maternal Uncle        smoker  . Diabetes Neg Hx     Social History   Socioeconomic History  . Marital status: Divorced    Spouse name: Not on file  . Number of children: Not on file  . Years of education: college  . Highest education level: Not on file  Occupational History  . Occupation: Retired    Fish farm manager: RETIRED  Tobacco Use  . Smoking status: Former Smoker    Years: 20.00    Types: Cigarettes    Quit date: 05/03/1979    Years since quitting: 40.5  . Smokeless tobacco: Never Used  . Tobacco comment: smoked 1962-1980, up to < 1 ppd  Substance and Sexual Activity  . Alcohol use: Yes    Alcohol/week: 7.0 standard drinks  Types: 7 Standard drinks or equivalent per week    Comment: occasional  . Drug use: No  . Sexual activity: Never    Comment: lives alone, dog, no dietary restrictions, eating heart healthy  Other Topics Concern  . Not on file  Social History Narrative   Pt gets regular exercise.   Drinks 2 cups of coffee a day    Social Determinants of Health   Financial Resource Strain:   . Difficulty of Paying Living Expenses: Not on file  Food Insecurity:   . Worried About Charity fundraiser in the Last Year: Not on file  . Ran Out of Food in the Last Year: Not on file  Transportation Needs:   . Lack of Transportation (Medical): Not on file  . Lack of Transportation (Non-Medical): Not on file  Physical Activity:   . Days of Exercise per Week: Not on file  . Minutes of Exercise per Session: Not on file  Stress:   . Feeling of Stress : Not on file  Social Connections:   . Frequency of Communication with Friends and  Family: Not on file  . Frequency of Social Gatherings with Friends and Family: Not on file  . Attends Religious Services: Not on file  . Active Member of Clubs or Organizations: Not on file  . Attends Archivist Meetings: Not on file  . Marital Status: Not on file  Intimate Partner Violence:   . Fear of Current or Ex-Partner: Not on file  . Emotionally Abused: Not on file  . Physically Abused: Not on file  . Sexually Abused: Not on file    Outpatient Medications Prior to Visit  Medication Sig Dispense Refill  . acetaminophen (TYLENOL) 325 MG tablet Take 650 mg by mouth every 6 (six) hours as needed for mild pain or headache.    Marland Kitchen amLODipine (NORVASC) 5 MG tablet Take 1.5 tablets (7.5 mg total) by mouth daily. 30 tablet 3  . CALCIUM PO Take 2 tablets by mouth 3 (three) times daily.    . DULoxetine (CYMBALTA) 30 MG capsule TAKE 1 CAPSULE BY MOUTH EVERY DAY 90 capsule 1  . ibuprofen (ADVIL) 200 MG tablet Take 200 mg by mouth every 6 (six) hours as needed for headache.    . traMADol (ULTRAM) 50 MG tablet TAKE 1 TABLET BY MOUTH TWICE A DAY AS NEEDED 14 tablet 2   No facility-administered medications prior to visit.    Allergies  Allergen Reactions  . Oxycodone     Mental status changes post op 12/11/13  . Sulfonamide Derivatives     REACTION: stomach pain    Review of Systems  Constitutional: Negative for fever and malaise/fatigue.  HENT: Negative for congestion.   Eyes: Negative for blurred vision.  Respiratory: Negative for shortness of breath.   Cardiovascular: Negative for chest pain, palpitations and leg swelling.  Gastrointestinal: Negative for abdominal pain, blood in stool and nausea.  Genitourinary: Negative for dysuria and frequency.  Musculoskeletal: Negative for falls.  Skin: Negative for rash.  Neurological: Negative for dizziness, loss of consciousness and headaches.  Endo/Heme/Allergies: Negative for environmental allergies.  Psychiatric/Behavioral:  Negative for depression. The patient is not nervous/anxious.        Objective:    Physical Exam Constitutional:      General: She is not in acute distress.    Appearance: She is not diaphoretic.  HENT:     Head: Normocephalic and atraumatic.     Right Ear: External ear normal.  Left Ear: External ear normal.     Nose: Nose normal.     Mouth/Throat:     Pharynx: No oropharyngeal exudate.  Eyes:     General: No scleral icterus.       Right eye: No discharge.        Left eye: No discharge.     Conjunctiva/sclera: Conjunctivae normal.     Pupils: Pupils are equal, round, and reactive to light.  Neck:     Thyroid: No thyromegaly.  Cardiovascular:     Rate and Rhythm: Normal rate and regular rhythm.     Heart sounds: Murmur present.  Pulmonary:     Effort: Pulmonary effort is normal. No respiratory distress.     Breath sounds: Normal breath sounds. No wheezing or rales.  Abdominal:     General: Bowel sounds are normal. There is no distension.     Palpations: Abdomen is soft. There is no mass.     Tenderness: There is no abdominal tenderness.  Musculoskeletal:        General: No tenderness. Normal range of motion.     Cervical back: Normal range of motion and neck supple.  Lymphadenopathy:     Cervical: No cervical adenopathy.  Skin:    General: Skin is warm and dry.     Findings: No rash.  Neurological:     Mental Status: She is alert and oriented to person, place, and time.     Cranial Nerves: No cranial nerve deficit.     Coordination: Coordination normal.     Deep Tendon Reflexes: Reflexes are normal and symmetric. Reflexes normal.     BP 122/78 (BP Location: Left Arm, Patient Position: Sitting, Cuff Size: Normal)   Pulse 98   Temp 98.1 F (36.7 C) (Temporal)   Resp 18   Wt 172 lb 9.6 oz (78.3 kg)   SpO2 96%   BMI 27.86 kg/m  Wt Readings from Last 3 Encounters:  11/06/19 172 lb 9.6 oz (78.3 kg)  09/13/19 170 lb (77.1 kg)  08/28/19 178 lb 1.9 oz (80.8 kg)     Diabetic Foot Exam - Simple   No data filed     Lab Results  Component Value Date   WBC 8.1 09/13/2019   HGB 14.6 09/13/2019   HCT 44.7 09/13/2019   PLT 275 09/13/2019   GLUCOSE 93 09/13/2019   CHOL 205 (H) 07/07/2019   TRIG 128.0 07/07/2019   HDL 64.70 07/07/2019   LDLDIRECT 137.2 10/23/2008   LDLCALC 115 (H) 07/07/2019   ALT 20 08/28/2019   AST 25 08/28/2019   NA 138 09/13/2019   K 3.7 09/13/2019   CL 101 09/13/2019   CREATININE 0.72 09/13/2019   BUN 18 09/13/2019   CO2 27 09/13/2019   TSH 2.31 07/07/2019   INR 0.95 06/17/2017   HGBA1C 5.6 02/13/2014    Lab Results  Component Value Date   TSH 2.31 07/07/2019   Lab Results  Component Value Date   WBC 8.1 09/13/2019   HGB 14.6 09/13/2019   HCT 44.7 09/13/2019   MCV 94.9 09/13/2019   PLT 275 09/13/2019   Lab Results  Component Value Date   NA 138 09/13/2019   K 3.7 09/13/2019   CO2 27 09/13/2019   GLUCOSE 93 09/13/2019   BUN 18 09/13/2019   CREATININE 0.72 09/13/2019   BILITOT 0.8 08/28/2019   ALKPHOS 87 08/28/2019   AST 25 08/28/2019   ALT 20 08/28/2019   PROT 7.0 08/28/2019   ALBUMIN  4.5 08/28/2019   CALCIUM 9.2 09/13/2019   ANIONGAP 10 09/13/2019   GFR 75.21 07/07/2019   Lab Results  Component Value Date   CHOL 205 (H) 07/07/2019   Lab Results  Component Value Date   HDL 64.70 07/07/2019   Lab Results  Component Value Date   LDLCALC 115 (H) 07/07/2019   Lab Results  Component Value Date   TRIG 128.0 07/07/2019   Lab Results  Component Value Date   CHOLHDL 3 07/07/2019   Lab Results  Component Value Date   HGBA1C 5.6 02/13/2014       Assessment & Plan:   Problem List Items Addressed This Visit    Vitamin D deficiency    Supplement and monitor      Hyperlipidemia, mixed    Encouraged heart healthy diet, increase exercise, avoid trans fats, consider a krill oil cap daily. She declines lab work today will proceed later in the year.       Hemochromatosis    Is  tolerating phlebotomy with hematology      HTN (hypertension)    Doing much better on Amlodipine 7.5 mg daily, no changes today. Home log reviewed.          I am having Jenya B. Piatkowski maintain her DULoxetine, traMADol, ibuprofen, acetaminophen, CALCIUM PO, and amLODipine.  No orders of the defined types were placed in this encounter.    Penni Homans, MD

## 2019-11-14 ENCOUNTER — Ambulatory Visit: Payer: Medicare Other | Admitting: Family

## 2019-11-14 ENCOUNTER — Other Ambulatory Visit: Payer: Medicare Other

## 2019-11-20 ENCOUNTER — Other Ambulatory Visit: Payer: Self-pay | Admitting: Family

## 2019-11-21 ENCOUNTER — Inpatient Hospital Stay: Payer: Medicare Other

## 2019-11-21 ENCOUNTER — Inpatient Hospital Stay (HOSPITAL_BASED_OUTPATIENT_CLINIC_OR_DEPARTMENT_OTHER): Payer: Medicare Other | Admitting: Family

## 2019-11-21 ENCOUNTER — Other Ambulatory Visit: Payer: Self-pay

## 2019-11-21 ENCOUNTER — Encounter: Payer: Self-pay | Admitting: Family

## 2019-11-21 LAB — CMP (CANCER CENTER ONLY)
ALT: 17 U/L (ref 0–44)
AST: 28 U/L (ref 15–41)
Albumin: 4.2 g/dL (ref 3.5–5.0)
Alkaline Phosphatase: 80 U/L (ref 38–126)
Anion gap: 9 (ref 5–15)
BUN: 18 mg/dL (ref 8–23)
CO2: 27 mmol/L (ref 22–32)
Calcium: 9.5 mg/dL (ref 8.9–10.3)
Chloride: 101 mmol/L (ref 98–111)
Creatinine: 0.77 mg/dL (ref 0.44–1.00)
GFR, Est AFR Am: >60 mL/min (ref >60)
GFR, Estimated: >60 mL/min (ref >60)
Glucose, Bld: 107 mg/dL — ABNORMAL HIGH (ref 70–99)
Potassium: 3.8 mmol/L (ref 3.5–5.1)
Sodium: 137 mmol/L (ref 135–145)
Total Bilirubin: 1 mg/dL (ref 0.3–1.2)
Total Protein: 7 g/dL (ref 6.5–8.1)

## 2019-11-21 LAB — CBC WITH DIFFERENTIAL (CANCER CENTER ONLY)
Abs Immature Granulocytes: 0.01 10*3/uL (ref 0.00–0.07)
Basophils Absolute: 0 10*3/uL (ref 0.0–0.1)
Basophils Relative: 1 %
Eosinophils Absolute: 0.2 10*3/uL (ref 0.0–0.5)
Eosinophils Relative: 2 %
HCT: 44.5 % (ref 36.0–46.0)
Hemoglobin: 15.1 g/dL — ABNORMAL HIGH (ref 12.0–15.0)
Immature Granulocytes: 0 %
Lymphocytes Relative: 40 %
Lymphs Abs: 3.2 10*3/uL (ref 0.7–4.0)
MCH: 30.8 pg (ref 26.0–34.0)
MCHC: 33.9 g/dL (ref 30.0–36.0)
MCV: 90.8 fL (ref 80.0–100.0)
Monocytes Absolute: 0.9 10*3/uL (ref 0.1–1.0)
Monocytes Relative: 11 %
Neutro Abs: 3.7 10*3/uL (ref 1.7–7.7)
Neutrophils Relative %: 46 %
Platelet Count: 277 10*3/uL (ref 150–400)
RBC: 4.9 MIL/uL (ref 3.87–5.11)
RDW: 11.9 % (ref 11.5–15.5)
WBC Count: 8 10*3/uL (ref 4.0–10.5)
nRBC: 0 % (ref 0.0–0.2)

## 2019-11-21 NOTE — Progress Notes (Signed)
Hematology and Oncology Follow Up Visit  Maria [name] [id number] [birth year] 83 y.o. 11/21/2019   Principle Diagnosis:  Hemochromatosis, homozygous for the C282Y mutation   Current Therapy:   Phlebotomy to maintain iron saturation < 50% and ferritin < 100    Interim History:  Maria [name] is here today for follow-up and phlebotomy. She is doing well and has tolerated her phlebotomy treatments nicely so far.  Hgb is 15.1/Hct 44.5. Iron studies are pending.  No fever, chills, n/v, cough, rash, dizziness, SOB, chest pain, palpitations, abdominal pain or changes in bowel or bladder habits.  No bleeding, bruising or petechiae.  She has some swelling in her right foot and pain on the top of the foot. She states that she will be calling her podiatrist for further work up this week.  No numbness or tingling in her extremities.  No falls or syncopal episodes to report.  She has maintained a good appetite and is staying well hydrated. Her weight is stable.   ECOG Performance Status: 1 - Symptomatic but completely ambulatory  Medications:  Allergies as of 11/21/2019      Reactions   Oxycodone    Mental status changes post op 12/11/13   Sulfonamide Derivatives    REACTION: stomach pain      Medication List       Accurate as of November 21, 2019  2:35 PM. If you have any questions, ask your nurse or doctor.        acetaminophen 325 MG tablet Commonly known as: TYLENOL Take 650 mg by mouth every 6 (six) hours as needed for mild pain or headache.   amLODipine 5 MG tablet Commonly known as: NORVASC Take 1.5 tablets (7.5 mg total) by mouth daily.   CALCIUM PO Take 2 tablets by mouth 3 (three) times daily.   DULoxetine 30 MG capsule Commonly known as: CYMBALTA TAKE 1 CAPSULE BY MOUTH EVERY DAY   ibuprofen 200 MG tablet Commonly known as: ADVIL Take 200 mg by mouth every 6 (six) hours as needed for headache.   traMADol 50 MG tablet Commonly known as: ULTRAM TAKE 1 TABLET BY  MOUTH TWICE A DAY AS NEEDED       Allergies:  Allergies  Allergen Reactions  . Oxycodone     Mental status changes post op 12/11/13  . Sulfonamide Derivatives     REACTION: stomach pain    Past Medical History, Surgical history, Social history, and Family History were reviewed and updated.  Review of Systems: All other 10 point review of systems is negative.   Physical Exam:  vitals were not taken for this visit.   Wt Readings from Last 3 Encounters:  11/06/19 172 lb 9.6 oz (78.3 kg)  09/13/19 170 lb (77.1 kg)  08/28/19 178 lb 1.9 oz (80.8 kg)    Ocular: Sclerae unicteric, pupils equal, round and reactive to light Ear-nose-throat: Oropharynx clear, dentition fair Lymphatic: No cervical or supraclavicular adenopathy Lungs no rales or rhonchi, good excursion bilaterally Heart regular rate and rhythm, no murmur appreciated Abd soft, nontender, positive bowel sounds, No liver or spleen tip palpated on exam, no fluid wave  MSK no focal spinal tenderness, no joint edema Neuro: non-focal, well-oriented, appropriate affect Breasts: Deferred   Lab Results  Component Value Date   WBC 8.0 11/21/2019   HGB 15.1 (H) 11/21/2019   HCT 44.5 11/21/2019   MCV 90.8 11/21/2019   PLT 277 11/21/2019   Lab Results  Component Value Date   FERRITIN 675 (  H) 08/28/2019   IRON 247 (H) 08/28/2019   TIBC 249 08/28/2019   UIBC 1 (L) 08/28/2019   IRONPCTSAT 99 (H) 08/28/2019   Lab Results  Component Value Date   RETICCTPCT 1.7 08/28/2019   RBC 4.90 11/21/2019   No results found for: KPAFRELGTCHN, LAMBDASER, KAPLAMBRATIO No results found for: IGGSERUM, IGA, IGMSERUM No results found for: Odetta Pink, SPEI   Chemistry      Component Value Date/Time   NA 137 11/21/2019 1353   NA 140 09/13/2012 0916   K 3.8 11/21/2019 1353   K 3.8 09/13/2012 0916   CL 101 11/21/2019 1353   CL 104 09/13/2012 0916   CO2 27 11/21/2019 1353   CO2 28  09/13/2012 0916   BUN 18 11/21/2019 1353   BUN 16.0 09/13/2012 0916   CREATININE 0.77 11/21/2019 1353   CREATININE 0.7 09/13/2012 0916      Component Value Date/Time   CALCIUM 9.5 11/21/2019 1353   CALCIUM 9.5 09/13/2012 0916   ALKPHOS 80 11/21/2019 1353   ALKPHOS 76 09/13/2012 0916   AST 28 11/21/2019 1353   AST 25 09/13/2012 0916   ALT 17 11/21/2019 1353   ALT 25 09/13/2012 0916   BILITOT 1.0 11/21/2019 1353   BILITOT 0.94 09/13/2012 0916       Impression and Plan: Maria [name] is a very pleasant 84 yo caucasian female with hemochromatosis, heterozygous for the C282Y mutation.  She is tolerating her phlebotomies nicely and did have one today.  We will see what her iron studies show and determine her schedule and follow-up.  She will contact our office with any questions or concerns. We can certainly see her sooner if needed.   [name], NP 1/19/20212:35 PM

## 2019-11-21 NOTE — Patient Instructions (Signed)

## 2019-11-21 NOTE — Progress Notes (Signed)
[name] presents today for phlebotomy per MD orders. Phlebotomy procedure started at 1518 and ended at 1529. 503 grams removed from rt AC using 20g IV catheter.  Patient observed for 30 minutes after procedure without any incident. Patient tolerated procedure well. IV needle removed intact.

## 2019-11-22 ENCOUNTER — Telehealth: Payer: Self-pay | Admitting: Family

## 2019-11-22 LAB — FERRITIN: Ferritin: 581 ng/mL — ABNORMAL HIGH (ref 11–307)

## 2019-11-22 LAB — IRON AND TIBC
Iron: 199 ug/dL — ABNORMAL HIGH (ref 41–142)
Saturation Ratios: 76 % — ABNORMAL HIGH (ref 21–57)
TIBC: 263 ug/dL (ref 236–444)
UIBC: 64 ug/dL — ABNORMAL LOW (ref 120–384)

## 2019-11-22 NOTE — Telephone Encounter (Signed)
No LOS 1/19

## 2019-11-23 ENCOUNTER — Telehealth: Payer: Self-pay | Admitting: Hematology & Oncology

## 2019-11-23 NOTE — Telephone Encounter (Signed)
Called and advised patient of appointments that have been added to her schedule per 1/21 sch msg

## 2019-12-10 ENCOUNTER — Ambulatory Visit: Payer: Medicare Other

## 2019-12-20 ENCOUNTER — Other Ambulatory Visit: Payer: Self-pay

## 2019-12-20 ENCOUNTER — Inpatient Hospital Stay: Payer: Medicare Other | Attending: Hematology & Oncology

## 2019-12-20 NOTE — Patient Instructions (Signed)

## 2019-12-27 ENCOUNTER — Ambulatory Visit: Payer: Medicare Other

## 2020-01-01 ENCOUNTER — Other Ambulatory Visit: Payer: Self-pay | Admitting: Family Medicine

## 2020-01-03 ENCOUNTER — Other Ambulatory Visit: Payer: Medicare Other

## 2020-01-03 ENCOUNTER — Ambulatory Visit: Payer: Medicare Other | Admitting: Family

## 2020-01-03 ENCOUNTER — Encounter: Payer: Self-pay | Admitting: Family Medicine

## 2020-01-03 ENCOUNTER — Other Ambulatory Visit: Payer: Self-pay | Admitting: Family

## 2020-01-04 ENCOUNTER — Ambulatory Visit: Payer: Medicare Other | Admitting: Family Medicine

## 2020-01-04 ENCOUNTER — Other Ambulatory Visit: Payer: Self-pay

## 2020-01-04 ENCOUNTER — Encounter: Payer: Self-pay | Admitting: Family Medicine

## 2020-01-04 DIAGNOSIS — M47896 Other spondylosis, lumbar region: Secondary | ICD-10-CM

## 2020-01-04 MED ORDER — GABAPENTIN 100 MG PO CAPS: 200.0000 mg | ORAL_CAPSULE | Freq: Every day | ORAL | 3 refills | Status: DC

## 2020-01-04 NOTE — Assessment & Plan Note (Addendum)
DDD and spinal stenosis. Chronic but stable. Discussed which activities to do. HEP, discussed pilates, massage, started gabapentin  Continue cymbalta  RTC in 6 weeks  Social determinants of health with decreased exercise secondary to pain as well as patient is unable to go to many places secondary to the coronavirus outbreak

## 2020-01-04 NOTE — Patient Instructions (Addendum)
Good to see you Gabapentin 200 at night Continue cymbalta Do your routine See me again in 6 weeks we will consider PT or increasing meds

## 2020-01-04 NOTE — Progress Notes (Signed)
[place] [address] [place] Phone: [phone] Subjective:   I Maria [name] am serving as a Education administrator for Dr. Hulan Saas.  This visit occurred during the SARS-CoV-2 public health emergency.  Safety protocols were in place, including screening questions prior to the visit, additional usage of staff PPE, and extensive cleaning of exam room while observing appropriate contact time as indicated for disinfecting solutions.   I'm seeing this patient by the request  of:  Mosie Lukes, MD  CC: Low back pain follow-up  [id number]   9/*02/2019 Significant arthritic changes with significant progression with most recent x-rays.  Patient signs and symptoms is concerning for more of a spinal stenosis.  With patient's history of also lung cancer at this time I do feel advanced imaging is warranted.  MRI ordered.  Patient is failed all other conservative therapy including formal physical therapy and medications.  We discussed laboratory work-up as well to make sure there is nothing else that could be contributing to the slow healing properties.  After labs as well as MRI we will discuss further treatment options including epidurals.  Spent  25 minutes with patient face-to-face and had greater than 50% of counseling including as described above in assessment and plan.  Update 01/04/2020 Maria [name] is a 84 y.o. female coming in with complaint of low back pain. Patient states she is feeling bad. Resolved recent iron issue. Wants to know how to proceed with her back.  Patient since then still has some daily pain.  Patient suggests can affect daily activity symptoms.  Patient denies any significant radiation down.  MRI 08/03/2019 IMPRESSION: 1. Diffuse lumbar spine spondylosis as described above. 2.  No acute osseous injury of the lumbar spine. 3. No aggressive osseous lesion to suggest metastatic disease.     Past Medical History:  Diagnosis  Date  . Complication of anesthesia    slow to awaken  . Depression   . Diverticulosis   . Goiter, nodular    [place] of thyroid stable 7/08  . Headache    hx of  . Heart murmur    due to rheumatic fever as child  . Hemochromatosis, hereditary ([place]) 09/01/2019  . Mitral valve prolapse   . Multiple thyroid nodules 11/24/2015  . Neoplasm of lung, malignant ([place]) 2002   Non-small cell surgical tx  . Osteoarthritis 11/14/2013   2010 intra-articular steroids X 3 S/P Physical Therapy in 12/14 TKR 12/11/13   . Osteopenia    Dr Pamala Hurry  . PONV (postoperative nausea and vomiting)    n/v  . Sleep apnea   . Vitamin D deficiency    Past Surgical History:  Procedure Laterality Date  . ABDOMINAL HYSTERECTOMY  1998   No BSO, for dysfunctional menses  . BLADDER SUSPENSION Bilateral   . BREAST BIOPSY Right   . COLONOSCOPY  2009 , 2014   Diverticulosis; Dr. Olevia Perches  . LOBECTOMY  12/02   RU; no radiation or chemo, Dr. Arlyce Dice  . TONSILLECTOMY    . TOTAL KNEE ARTHROPLASTY Right 12/11/2013   Procedure: RIGHT TOTAL KNEE ARTHROPLASTY;  Surgeon: Gearlean Alf, MD;  Location: [facility];  Service: Orthopedics;  Laterality: Right;  . TOTAL KNEE ARTHROPLASTY Left 06/28/2017   Procedure: LEFT TOTAL KNEE ARTHROPLASTY;  Surgeon: Gaynelle Arabian, MD;  Location: [facility];  Service: Orthopedics;  Laterality: Left;  Adductor Block   Social History   Socioeconomic History  . Marital status: Divorced  Spouse name: Not on file  . Number of children: Not on file  . Years of education: college  . Highest education level: Not on file  Occupational History  . Occupation: Retired    Fish farm manager: RETIRED  Tobacco Use  . Smoking status: Former Smoker    Years: 20.00    Types: Cigarettes    Quit date: 05/03/1979    Years since quitting: 40.7  . Smokeless tobacco: Never Used  . Tobacco comment: smoked 1962-1980, up to < 1 ppd  Substance and Sexual Activity  . Alcohol use: Yes    Alcohol/week: 7.0 standard drinks     Types: 7 Standard drinks or equivalent per week    Comment: occasional  . Drug use: No  . Sexual activity: Never    Comment: lives alone, dog, no dietary restrictions, eating heart healthy  Other Topics Concern  . Not on file  Social History Narrative   Pt gets regular exercise.   Drinks 2 cups of coffee a day    Social Determinants of Health   Financial Resource Strain:   . Difficulty of Paying Living Expenses: Not on file  Food Insecurity:   . Worried About Charity fundraiser in the Last Year: Not on file  . Ran Out of Food in the Last Year: Not on file  Transportation Needs:   . Lack of Transportation (Medical): Not on file  . Lack of Transportation (Non-Medical): Not on file  Physical Activity:   . Days of Exercise per Week: Not on file  . Minutes of Exercise per Session: Not on file  Stress:   . Feeling of Stress : Not on file  Social Connections:   . Frequency of Communication with Friends and Family: Not on file  . Frequency of Social Gatherings with Friends and Family: Not on file  . Attends Religious Services: Not on file  . Active Member of Clubs or Organizations: Not on file  . Attends Archivist Meetings: Not on file  . Marital Status: Not on file   No Known Allergies Family History  Problem Relation Age of Onset  . COPD Mother   . Coronary artery disease Father   . Colon cancer Father 67  . Heart attack Father 1  . Stroke Father        > 28  . Lung cancer Paternal Aunt        smoker  . Cancer Paternal Aunt        lung  . Lung cancer Maternal Grandfather        smoker  . COPD Maternal Grandfather   . Arthritis Sister   . Lung cancer Maternal Uncle        smoker  . Diabetes Neg Hx      Current Outpatient Medications (Cardiovascular):  .  amLODipine (NORVASC) 5 MG tablet, TAKE 1.5 TABLETS (7.5 MG TOTAL) BY MOUTH DAILY.   Current Outpatient Medications (Analgesics):  .  ibuprofen (ADVIL) 200 MG tablet, Take 200 mg by mouth every 6  (six) hours as needed for headache. .  traMADol (ULTRAM) 50 MG tablet, TAKE 1 TABLET BY MOUTH TWICE A DAY AS NEEDED   Current Outpatient Medications (Other):  Marland Kitchen  DULoxetine (CYMBALTA) 30 MG capsule, TAKE 1 CAPSULE BY MOUTH EVERY DAY .  gabapentin (NEURONTIN) 100 MG capsule, Take 2 capsules (200 mg total) by mouth at bedtime.   Reviewed prior external information including notes and imaging from  primary care provider As well as notes  that were available from care everywhere and other healthcare systems.  Past medical history, social, surgical and family history all reviewed in electronic medical record.  No pertanent information unless stated regarding to the chief complaint.   Review of Systems:  No headache, visual changes, nausea, vomiting, diarrhea, constipation, dizziness, abdominal pain, skin rash, fevers, chills, night sweats, weight loss, swollen lymph nodes, body aches, joint swelling, chest pain, shortness of breath, mood changes. POSITIVE muscle aches  Objective  Blood pressure 100/70, pulse 86, height 5\' 6"  (1.676 m), weight 175 lb (79.4 kg), SpO2 97 %.   General: No apparent distress alert and oriented x3 mood and affect normal, dressed appropriately.  HEENT: Pupils equal, extraocular movements intact  Respiratory: Patient's speak in full sentences and does not appear short of breath  Cardiovascular: No lower extremity edema, non tender, no erythema  Skin: Warm dry intact with no signs of infection or rash on extremities or on axial skeleton.  Abdomen: Soft nontender  Neuro: Cranial nerves II through XII are intact, neurovascularly intact in all extremities with 2+ DTRs and 2+ pulses.  Lymph: No lymphadenopathy of posterior or anterior cervical chain or axillae bilaterally.  Gait normal with good balance and coordination.  MSK: Arthritic changes noted joints y.  Neck exam does have some loss of lordosis with some degenerative scoliosis.  Tightness noted in the paraspinal  musculature.  Mild tightness with straight leg test.  Neurovascularly intact distally   Impression and Recommendations:     This case required medical decision making of moderate complexity. The above documentation has been reviewed and is accurate and complete Lyndal Pulley, DO       Note: This dictation was prepared with Dragon dictation along with smaller phrase technology. Any transcriptional errors that result from this process are unintentional.

## 2020-01-05 ENCOUNTER — Inpatient Hospital Stay (HOSPITAL_BASED_OUTPATIENT_CLINIC_OR_DEPARTMENT_OTHER): Payer: Medicare Other | Admitting: Family

## 2020-01-05 ENCOUNTER — Encounter: Payer: Self-pay | Admitting: Family

## 2020-01-05 ENCOUNTER — Inpatient Hospital Stay: Payer: Medicare Other | Attending: Hematology & Oncology

## 2020-01-05 ENCOUNTER — Other Ambulatory Visit: Payer: Self-pay

## 2020-01-05 LAB — CMP (CANCER CENTER ONLY)
ALT: 14 U/L (ref 0–44)
AST: 21 U/L (ref 15–41)
Albumin: 4.2 g/dL (ref 3.5–5.0)
Alkaline Phosphatase: 77 U/L (ref 38–126)
Anion gap: 9 (ref 5–15)
BUN: 17 mg/dL (ref 8–23)
CO2: 27 mmol/L (ref 22–32)
Calcium: 9.3 mg/dL (ref 8.9–10.3)
Chloride: 102 mmol/L (ref 98–111)
Creatinine: 0.78 mg/dL (ref 0.44–1.00)
GFR, Est AFR Am: >60 mL/min (ref >60)
GFR, Estimated: >60 mL/min (ref >60)
Glucose, Bld: 161 mg/dL — ABNORMAL HIGH (ref 70–99)
Potassium: 3.2 mmol/L — ABNORMAL LOW (ref 3.5–5.1)
Sodium: 138 mmol/L (ref 135–145)
Total Bilirubin: 0.7 mg/dL (ref 0.3–1.2)
Total Protein: 6.8 g/dL (ref 6.5–8.1)

## 2020-01-05 LAB — CBC WITH DIFFERENTIAL (CANCER CENTER ONLY)
Abs Immature Granulocytes: 0.02 10*3/uL (ref 0.00–0.07)
Basophils Absolute: 0 10*3/uL (ref 0.0–0.1)
Basophils Relative: 0 %
Eosinophils Absolute: 0.3 10*3/uL (ref 0.0–0.5)
Eosinophils Relative: 4 %
HCT: 41.9 % (ref 36.0–46.0)
Hemoglobin: 14.1 g/dL (ref 12.0–15.0)
Immature Granulocytes: 0 %
Lymphocytes Relative: 29 %
Lymphs Abs: 2 10*3/uL (ref 0.7–4.0)
MCH: 31.1 pg (ref 26.0–34.0)
MCHC: 33.7 g/dL (ref 30.0–36.0)
MCV: 92.5 fL (ref 80.0–100.0)
Monocytes Absolute: 0.9 10*3/uL (ref 0.1–1.0)
Monocytes Relative: 13 %
Neutro Abs: 3.6 10*3/uL (ref 1.7–7.7)
Neutrophils Relative %: 54 %
Platelet Count: 260 10*3/uL (ref 150–400)
RBC: 4.53 MIL/uL (ref 3.87–5.11)
RDW: 12.8 % (ref 11.5–15.5)
WBC Count: 6.8 10*3/uL (ref 4.0–10.5)
nRBC: 0 % (ref 0.0–0.2)

## 2020-01-05 NOTE — Progress Notes (Signed)
Hematology and Oncology Follow Up Visit  Maria [name] [id number] [birth year] 84 y.o. 01/05/2020   Principle Diagnosis:  Hemochromatosis, homozygous for the C282Y mutation   Current Therapy:   Phlebotomy to maintain iron saturation < 50% and ferritin < 100    Interim History:  Maria [name] is here today for follow-up. She is doing well and was able to get her second covid vaccine earlier this week on Wednesday. She had a headache, nausea and some arm pain at the injection site for the first couple days but is feeling better.  No ruddy complexion.  She has some mild fatigue at times.  No fever, chills, n/v, cough, rash, dizziness, headache, blurred or loss of vision, hot flashes, night sweats, SOB, chest pain, palpitations, abdominal pain or changes in bowel or bladder habits.  No episodes of bleeding. No bruising or petechiae.  No swelling, tenderness, numbness or tingling in her extremities.  No falls or syncope.  She has maintained a good appetite and is hydrating well throughout the day. Her weight is stable.   ECOG Performance Status: 1 - Symptomatic but completely ambulatory  Medications:  Allergies as of 01/05/2020   No Known Allergies     Medication List       Accurate as of January 05, 2020  2:36 PM. If you have any questions, ask your nurse or doctor.        amLODipine 5 MG tablet Commonly known as: NORVASC TAKE 1.5 TABLETS (7.5 MG TOTAL) BY MOUTH DAILY.   DULoxetine 30 MG capsule Commonly known as: CYMBALTA TAKE 1 CAPSULE BY MOUTH EVERY DAY   gabapentin 100 MG capsule Commonly known as: NEURONTIN Take 2 capsules (200 mg total) by mouth at bedtime.   ibuprofen 200 MG tablet Commonly known as: ADVIL Take 200 mg by mouth every 6 (six) hours as needed for headache.   traMADol 50 MG tablet Commonly known as: ULTRAM TAKE 1 TABLET BY MOUTH TWICE A DAY AS NEEDED       Allergies: No Known Allergies  Past Medical History, Surgical history, Social history,  and Family History were reviewed and updated.  Review of Systems: All other 10 point review of systems is negative.   Physical Exam:  height is 5\' 6"  (1.676 m) and weight is 172 lb 12.8 oz (78.4 kg). Her temporal temperature is 96.9 F (36.1 C) (abnormal). Her blood pressure is 125/73 and her pulse is 91. Her respiration is 18 and oxygen saturation is 98%.   Wt Readings from Last 3 Encounters:  01/05/20 172 lb 12.8 oz (78.4 kg)  01/04/20 175 lb (79.4 kg)  11/21/19 172 lb 1.9 oz (78.1 kg)    Ocular: Sclerae unicteric, pupils equal, round and reactive to light Ear-nose-throat: Oropharynx clear, dentition fair Lymphatic: No cervical or supraclavicular adenopathy Lungs no rales or rhonchi, good excursion bilaterally Heart regular rate and rhythm, no murmur appreciated Abd soft, nontender, positive bowel sounds, no liver or spleen tip palpated on exam, no fluid wave  MSK no focal spinal tenderness, no joint edema Neuro: non-focal, well-oriented, appropriate affect Breasts: Deferred   Lab Results  Component Value Date   WBC 6.8 01/05/2020   HGB 14.1 01/05/2020   HCT 41.9 01/05/2020   MCV 92.5 01/05/2020   PLT 260 01/05/2020   Lab Results  Component Value Date   FERRITIN 581 (H) 11/21/2019   IRON 199 (H) 11/21/2019   TIBC 263 11/21/2019   UIBC 64 (L) 11/21/2019   IRONPCTSAT 76 (H) 11/21/2019  Lab Results  Component Value Date   RETICCTPCT 1.7 08/28/2019   RBC 4.53 01/05/2020   No results found for: KPAFRELGTCHN, LAMBDASER, KAPLAMBRATIO No results found for: IGGSERUM, IGA, IGMSERUM No results found for: Odetta Pink, SPEI   Chemistry      Component Value Date/Time   NA 138 01/05/2020 1340   NA 140 09/13/2012 0916   K 3.2 (L) 01/05/2020 1340   K 3.8 09/13/2012 0916   CL 102 01/05/2020 1340   CL 104 09/13/2012 0916   CO2 27 01/05/2020 1340   CO2 28 09/13/2012 0916   BUN 17 01/05/2020 1340   BUN 16.0 09/13/2012  0916   CREATININE 0.78 01/05/2020 1340   CREATININE 0.7 09/13/2012 0916      Component Value Date/Time   CALCIUM 9.3 01/05/2020 1340   CALCIUM 9.5 09/13/2012 0916   ALKPHOS 77 01/05/2020 1340   ALKPHOS 76 09/13/2012 0916   AST 21 01/05/2020 1340   AST 25 09/13/2012 0916   ALT 14 01/05/2020 1340   ALT 25 09/13/2012 0916   BILITOT 0.7 01/05/2020 1340   BILITOT 0.94 09/13/2012 0916       Impression and Plan: Maria [name] is a very pleasant 84 yo caucasian female with hemochromatosis, heterozygous for the C282Y mutation.  We will see what her iron studies look like and get her phlebotomy plan set up next week if needed.  We will go ahead and plan to see her back in another 6 weeks.  She will contact our office with any questions or concerns. We can certainly see her sooner if needed.   [name], NP 3/5/20212:36 PM

## 2020-01-08 LAB — IRON AND TIBC
Iron: 58 ug/dL (ref 41–142)
Saturation Ratios: 22 % (ref 21–57)
TIBC: 265 ug/dL (ref 236–444)
UIBC: 207 ug/dL (ref 120–384)

## 2020-01-08 LAB — FERRITIN: Ferritin: 519 ng/mL — ABNORMAL HIGH (ref 11–307)

## 2020-01-19 ENCOUNTER — Other Ambulatory Visit: Payer: Self-pay | Admitting: Family Medicine

## 2020-01-24 ENCOUNTER — Other Ambulatory Visit: Payer: Self-pay | Admitting: Family Medicine

## 2020-01-24 ENCOUNTER — Encounter: Payer: Self-pay | Admitting: Family Medicine

## 2020-01-24 MED ORDER — TRAMADOL HCL 50 MG PO TABS: 50.0000 mg | ORAL_TABLET | Freq: Two times a day (BID) | ORAL | 0 refills | Status: DC | PRN

## 2020-01-24 NOTE — Telephone Encounter (Signed)
Refill request for Tramadol.   Last OV: 11/06/2019 Last Fill; 09/07/2019 #14 and 2RF Pt sig: 1 tab bid prn UDS: None seen

## 2020-02-13 ENCOUNTER — Telehealth: Payer: Self-pay | Admitting: Family

## 2020-02-13 ENCOUNTER — Inpatient Hospital Stay: Payer: Medicare Other | Attending: Hematology & Oncology

## 2020-02-13 ENCOUNTER — Other Ambulatory Visit: Payer: Self-pay

## 2020-02-13 ENCOUNTER — Encounter: Payer: Self-pay | Admitting: Family

## 2020-02-13 ENCOUNTER — Inpatient Hospital Stay (HOSPITAL_BASED_OUTPATIENT_CLINIC_OR_DEPARTMENT_OTHER): Payer: Medicare Other | Admitting: Family

## 2020-02-13 DIAGNOSIS — M549 Dorsalgia, unspecified: Secondary | ICD-10-CM | POA: Diagnosis not present

## 2020-02-13 DIAGNOSIS — G8929 Other chronic pain: Secondary | ICD-10-CM | POA: Insufficient documentation

## 2020-02-13 DIAGNOSIS — Z79899 Other long term (current) drug therapy: Secondary | ICD-10-CM | POA: Diagnosis not present

## 2020-02-13 LAB — CBC WITH DIFFERENTIAL (CANCER CENTER ONLY)
Abs Immature Granulocytes: 0.03 10*3/uL (ref 0.00–0.07)
Basophils Absolute: 0 10*3/uL (ref 0.0–0.1)
Basophils Relative: 1 %
Eosinophils Absolute: 0.1 10*3/uL (ref 0.0–0.5)
Eosinophils Relative: 2 %
HCT: 44.5 % (ref 36.0–46.0)
Hemoglobin: 14.9 g/dL (ref 12.0–15.0)
Immature Granulocytes: 0 %
Lymphocytes Relative: 40 %
Lymphs Abs: 3 10*3/uL (ref 0.7–4.0)
MCH: 30.4 pg (ref 26.0–34.0)
MCHC: 33.5 g/dL (ref 30.0–36.0)
MCV: 90.8 fL (ref 80.0–100.0)
Monocytes Absolute: 0.8 10*3/uL (ref 0.1–1.0)
Monocytes Relative: 11 %
Neutro Abs: 3.5 10*3/uL (ref 1.7–7.7)
Neutrophils Relative %: 46 %
Platelet Count: 276 10*3/uL (ref 150–400)
RBC: 4.9 MIL/uL (ref 3.87–5.11)
RDW: 12.5 % (ref 11.5–15.5)
WBC Count: 7.5 10*3/uL (ref 4.0–10.5)
nRBC: 0 % (ref 0.0–0.2)

## 2020-02-13 LAB — CMP (CANCER CENTER ONLY)
ALT: 13 U/L (ref 0–44)
AST: 21 U/L (ref 15–41)
Albumin: 4.4 g/dL (ref 3.5–5.0)
Alkaline Phosphatase: 89 U/L (ref 38–126)
Anion gap: 8 (ref 5–15)
BUN: 21 mg/dL (ref 8–23)
CO2: 30 mmol/L (ref 22–32)
Calcium: 9.8 mg/dL (ref 8.9–10.3)
Chloride: 101 mmol/L (ref 98–111)
Creatinine: 0.67 mg/dL (ref 0.44–1.00)
GFR, Est AFR Am: >60 mL/min (ref >60)
GFR, Estimated: >60 mL/min (ref >60)
Glucose, Bld: 98 mg/dL (ref 70–99)
Potassium: 3.9 mmol/L (ref 3.5–5.1)
Sodium: 139 mmol/L (ref 135–145)
Total Bilirubin: 0.7 mg/dL (ref 0.3–1.2)
Total Protein: 6.7 g/dL (ref 6.5–8.1)

## 2020-02-13 NOTE — Telephone Encounter (Signed)
Called and LMVM for patient regarding appointments added per  4/13 los

## 2020-02-13 NOTE — Progress Notes (Signed)
Hematology and Oncology Follow Up Visit  Maria [name] [id number] 14-Nov-1935 84 y.o. 02/13/2020   Principle Diagnosis:  Hemochromatosis, homozygous for the C282Y mutation  Current Therapy:        Phlebotomy to maintain iron saturation < 50% and ferritin < 100   Interim History:  Maria [name] is here today for follow-up. She is doing well but has issues with chronic back pain.  She has been having GERD and started taking psyllium which has resolved her symptoms but now she has noted constipation.  She states that she is trying to schedule a colonoscopy. He father had history of colon cancer.  She has not noted any blood loss. No bruising or petechiae.  Hgb is stable at 14.9, Hct 44.5%. Iron studies are pending.  Last iron saturation was 22% and ferritin 519.  She is interested in donating with [facility] and will let us know if she decides to.  No fever, chills, n/v, cough, rash,dizziness, headache, blurred/loss of vision, SOB, chest pain, palpitations, abdominal pain or changes in bowel or bladder habits.  No swelling, numbness or tingling in her extremities.  No falls or syncope.  She states that she has a good appetite and is staying well hydrated. Her weight is stable.   ECOG Performance Status: 1 - Symptomatic but completely ambulatory  Medications:  Allergies as of 02/13/2020   No Known Allergies     Medication List       Accurate as of February 13, 2020 12:00 PM. If you have any questions, ask your nurse or doctor.        amLODipine 5 MG tablet Commonly known as: NORVASC TAKE 1.5 TABLETS (7.5 MG TOTAL) BY MOUTH DAILY.   DULoxetine 30 MG capsule Commonly known as: CYMBALTA TAKE 1 CAPSULE BY MOUTH EVERY DAY   gabapentin 100 MG capsule Commonly known as: NEURONTIN Take 2 capsules (200 mg total) by mouth at bedtime.   ibuprofen 200 MG tablet Commonly known as: ADVIL Take 200 mg by mouth every 6 (six) hours as needed for headache.   traMADol 50 MG  tablet Commonly known as: ULTRAM Take 1 tablet (50 mg total) by mouth 2 (two) times daily as needed.       Allergies: No Known Allergies  Past Medical History, Surgical history, Social history, and Family History were reviewed and updated.  Review of Systems: All other 10 point review of systems is negative.   Physical Exam:  vitals were not taken for this visit.   Wt Readings from Last 3 Encounters:  01/05/20 172 lb 12.8 oz (78.4 kg)  01/04/20 175 lb (79.4 kg)  11/21/19 172 lb 1.9 oz (78.1 kg)    Ocular: Sclerae unicteric, pupils equal, round and reactive to light Ear-nose-throat: Oropharynx clear, dentition fair Lymphatic: No cervical or supraclavicular adenopathy Lungs no rales or rhonchi, good excursion bilaterally Heart regular rate and rhythm, no murmur appreciated Abd soft, nontender, positive bowel sounds, no liver or spleen tip palpated on exam, no fluid wave  MSK no focal spinal tenderness, no joint edema Neuro: non-focal, well-oriented, appropriate affect Breasts: Deferred   Lab Results  Component Value Date   WBC 7.5 02/13/2020   HGB 14.9 02/13/2020   HCT 44.5 02/13/2020   MCV 90.8 02/13/2020   PLT 276 02/13/2020   Lab Results  Component Value Date   FERRITIN 519 (H) 01/05/2020   IRON 58 01/05/2020   TIBC 265 01/05/2020   UIBC 207 01/05/2020   IRONPCTSAT 22 01/05/2020   Lab  Results  Component Value Date   RETICCTPCT 1.7 08/28/2019   RBC 4.90 02/13/2020   No results found for: KPAFRELGTCHN, LAMBDASER, KAPLAMBRATIO No results found for: IGGSERUM, IGA, IGMSERUM No results found for: Odetta Pink, SPEI   Chemistry      Component Value Date/Time   NA 139 02/13/2020 1115   NA 140 09/13/2012 0916   K 3.9 02/13/2020 1115   K 3.8 09/13/2012 0916   CL 101 02/13/2020 1115   CL 104 09/13/2012 0916   CO2 30 02/13/2020 1115   CO2 28 09/13/2012 0916   BUN 21 02/13/2020 1115   BUN 16.0 09/13/2012  0916   CREATININE 0.67 02/13/2020 1115   CREATININE 0.7 09/13/2012 0916      Component Value Date/Time   CALCIUM 9.8 02/13/2020 1115   CALCIUM 9.5 09/13/2012 0916   ALKPHOS 89 02/13/2020 1115   ALKPHOS 76 09/13/2012 0916   AST 21 02/13/2020 1115   AST 25 09/13/2012 0916   ALT 13 02/13/2020 1115   ALT 25 09/13/2012 0916   BILITOT 0.7 02/13/2020 1115   BILITOT 0.94 09/13/2012 0916       Impression and Plan: Maria [name] is a very pleasant 84 yo caucasian female with hemochromatosis, heterozygous for the C282Y mutation.  We will see what her iron studies look like and get her phlebotomy plan set up next week if needed.  We will go ahead and plan to see her back in another 2 months.  She will contact our office with any questions or concerns. We can certainly see her sooner if needed.   [name], NP 4/13/202112:00 PM

## 2020-02-14 ENCOUNTER — Telehealth: Payer: Self-pay | Admitting: Family

## 2020-02-14 LAB — FERRITIN: Ferritin: 434 ng/mL — ABNORMAL HIGH (ref 11–307)

## 2020-02-14 LAB — IRON AND TIBC
Iron: 176 ug/dL — ABNORMAL HIGH (ref 41–142)
Saturation Ratios: 65 % — ABNORMAL HIGH (ref 21–57)
TIBC: 273 ug/dL (ref 236–444)
UIBC: 97 ug/dL — ABNORMAL LOW (ref 120–384)

## 2020-02-14 NOTE — Telephone Encounter (Signed)
Called and LMVM for patient regarding appointments scheduled per 4/14 sch msg.  I asked patient to call back to confirm dates/times

## 2020-02-15 ENCOUNTER — Encounter: Payer: Self-pay | Admitting: Family Medicine

## 2020-02-15 ENCOUNTER — Ambulatory Visit: Payer: Medicare Other | Admitting: Family Medicine

## 2020-02-15 ENCOUNTER — Other Ambulatory Visit: Payer: Self-pay

## 2020-02-15 DIAGNOSIS — M47896 Other spondylosis, lumbar region: Secondary | ICD-10-CM

## 2020-02-15 MED ORDER — GABAPENTIN 100 MG PO CAPS: 200.0000 mg | ORAL_CAPSULE | Freq: Every day | ORAL | 0 refills | Status: DC

## 2020-02-15 NOTE — Patient Instructions (Signed)
Keep it up Gabapentin refill See me in 2-3 months

## 2020-02-15 NOTE — Progress Notes (Signed)
[place] Phone: [phone] Subjective:   Fontaine No, am serving as a scribe for Dr. Hulan Saas. This visit occurred during the SARS-CoV-2 public health emergency.  Safety protocols were in place, including screening questions prior to the visit, additional usage of staff PPE, and extensive cleaning of exam room while observing appropriate contact time as indicated for disinfecting solutions.   I'm seeing this patient by the request  of:  Mosie Lukes, MD  CC: Low back pain follow-up  FWY:[id number]   01/04/2020 DDD and spinal stenosis. Chronic but stable. Discussed which activities to do. HEP, discussed pilates, massage, started gabapentin  Continue cymbalta  RTC in 6 weeks  Social determinants of health with decreased exercise secondary to pain as well as patient is unable to go to many places secondary to the coronavirus outbreak  Update 02/15/2020 Maria [name] is a 84 y.o. female coming in with complaint of low back pain. Patient had done accupunture and massage. Feels some improvement. Patient has been using gabapentin. Uses Tramadol prn.  Patient does state that approximately 60 to 70% better.  Not stopping her from any activities at this time.  Very happy with the result so far    Past Medical History:  Diagnosis Date  . Complication of anesthesia    slow to awaken  . Depression   . Diverticulosis   . Goiter, nodular    [place] of thyroid stable 7/08  . Headache    hx of  . Heart murmur    due to rheumatic fever as child  . Hemochromatosis, hereditary ([place]) 09/01/2019  . Mitral valve prolapse   . Multiple thyroid nodules 11/24/2015  . Neoplasm of lung, malignant ([place]) 2002   Non-small cell surgical tx  . Osteoarthritis 11/14/2013   2010 intra-articular steroids X 3 S/P Physical Therapy in 12/14 TKR 12/11/13   . Osteopenia    Dr Pamala Hurry  . PONV (postoperative nausea and vomiting)    n/v    . Sleep apnea   . Vitamin D deficiency    Past Surgical History:  Procedure Laterality Date  . ABDOMINAL HYSTERECTOMY  1998   No BSO, for dysfunctional menses  . BLADDER SUSPENSION Bilateral   . BREAST BIOPSY Right   . COLONOSCOPY  2009 , 2014   Diverticulosis; Dr. Olevia Perches  . LOBECTOMY  12/02   RU; no radiation or chemo, Dr. Arlyce Dice  . TONSILLECTOMY    . TOTAL KNEE ARTHROPLASTY Right 12/11/2013   Procedure: RIGHT TOTAL KNEE ARTHROPLASTY;  Surgeon: Gearlean Alf, MD;  Location: [facility];  Service: Orthopedics;  Laterality: Right;  . TOTAL KNEE ARTHROPLASTY Left 06/28/2017   Procedure: LEFT TOTAL KNEE ARTHROPLASTY;  Surgeon: Gaynelle Arabian, MD;  Location: [facility];  Service: Orthopedics;  Laterality: Left;  Adductor Block   Social History   Socioeconomic History  . Marital status: Divorced    Spouse name: Not on file  . Number of children: Not on file  . Years of education: college  . Highest education level: Not on file  Occupational History  . Occupation: Retired    Fish farm manager: RETIRED  Tobacco Use  . Smoking status: Former Smoker    Years: 20.00    Types: Cigarettes    Quit date: 05/03/1979    Years since quitting: 40.8  . Smokeless tobacco: Never Used  . Tobacco comment: smoked 1962-1980, up to < 1 ppd  Substance and Sexual Activity  . Alcohol  use: Yes    Alcohol/week: 7.0 standard drinks    Types: 7 Standard drinks or equivalent per week    Comment: occasional  . Drug use: No  . Sexual activity: Never    Comment: lives alone, dog, no dietary restrictions, eating heart healthy  Other Topics Concern  . Not on file  Social History Narrative   Pt gets regular exercise.   Drinks 2 cups of coffee a day    Social Determinants of Radio broadcast assistant Strain:   . Difficulty of Paying Living Expenses:   Food Insecurity:   . Worried About Charity fundraiser in the Last Year:   . Arboriculturist in the Last Year:   Transportation Needs:   . Film/video editor  (Medical):   Marland Kitchen Lack of Transportation (Non-Medical):   Physical Activity:   . Days of Exercise per Week:   . Minutes of Exercise per Session:   Stress:   . Feeling of Stress :   Social Connections:   . Frequency of Communication with Friends and Family:   . Frequency of Social Gatherings with Friends and Family:   . Attends Religious Services:   . Active Member of Clubs or Organizations:   . Attends Archivist Meetings:   Marland Kitchen Marital Status:    No Known Allergies Family History  Problem Relation Age of Onset  . COPD Mother   . Coronary artery disease Father   . Colon cancer Father 15  . Heart attack Father 45  . Stroke Father        > 68  . Lung cancer Paternal Aunt        smoker  . Cancer Paternal Aunt        lung  . Lung cancer Maternal Grandfather        smoker  . COPD Maternal Grandfather   . Arthritis Sister   . Lung cancer Maternal Uncle        smoker  . Diabetes Neg Hx      Current Outpatient Medications (Cardiovascular):  .  amLODipine (NORVASC) 5 MG tablet, TAKE 1.5 TABLETS (7.5 MG TOTAL) BY MOUTH DAILY.   Current Outpatient Medications (Analgesics):  .  ibuprofen (ADVIL) 200 MG tablet, Take 200 mg by mouth every 6 (six) hours as needed for headache. .  traMADol (ULTRAM) 50 MG tablet, Take 1 tablet (50 mg total) by mouth 2 (two) times daily as needed.   Current Outpatient Medications (Other):  Marland Kitchen  DULoxetine (CYMBALTA) 30 MG capsule, TAKE 1 CAPSULE BY MOUTH EVERY DAY .  gabapentin (NEURONTIN) 100 MG capsule, Take 2 capsules (200 mg total) by mouth at bedtime. .  gabapentin (NEURONTIN) 100 MG capsule, Take 2 capsules (200 mg total) by mouth at bedtime.   Reviewed prior external information including notes and imaging from  primary care provider As well as notes that were available from care everywhere and other healthcare systems.  Past medical history, social, surgical and family history all reviewed in electronic medical record.  No pertanent  information unless stated regarding to the chief complaint.   Review of Systems:  No headache, visual changes, nausea, vomiting, diarrhea, constipation, dizziness, abdominal pain, skin rash, fevers, chills, night sweats, weight loss, swollen lymph nodes,  joint swelling, chest pain, shortness of breath, mood changes. POSITIVE muscle aches, body aches  Objective  Blood pressure 110/82, pulse 84, height 5\' 6"  (1.676 m), weight 174 lb (78.9 kg), SpO2 99 %.   General:  No apparent distress alert and oriented x3 mood and affect normal, dressed appropriately.  HEENT: Pupils equal, extraocular movements intact  Respiratory: Patient's speak in full sentences and does not appear short of breath  Cardiovascular: No lower extremity edema, non tender, no erythema  Neuro: Cranial nerves II through XII are intact, neurovascularly intact in all extremities with 2+ DTRs and 2+ pulses.  Gait mild antalgic y.  Continued arthritic changes of multiple joints.  Patient does have significant kyphosis and does have some scoliosis noted of the lumbar spine.  Tightness with straight leg test but no radicular symptoms.  Limited range of motion in all planes secondary to tightness of the back   Impression and Recommendations:     The above documentation has been reviewed and is accurate and complete Lyndal Pulley, DO       Note: This dictation was prepared with Dragon dictation along with smaller phrase technology. Any transcriptional errors that result from this process are unintentional.

## 2020-02-15 NOTE — Assessment & Plan Note (Signed)
Patient is doing better at this moment.  Continue the gabapentin and the Cymbalta.  Do have room to increase Cymbalta if necessary but patient is having some mild morning somnolence secondary to the gabapentin.  Patient will continue the home exercises and follow-up again in 2 to 3 months time for further evaluation and treatment

## 2020-02-16 ENCOUNTER — Other Ambulatory Visit: Payer: Medicare Other

## 2020-02-16 ENCOUNTER — Ambulatory Visit: Payer: Medicare Other | Admitting: Family

## 2020-03-01 ENCOUNTER — Inpatient Hospital Stay: Payer: Medicare Other

## 2020-03-08 ENCOUNTER — Other Ambulatory Visit: Payer: Self-pay

## 2020-03-08 ENCOUNTER — Inpatient Hospital Stay: Payer: Medicare Other | Attending: Hematology & Oncology

## 2020-03-08 NOTE — Patient Instructions (Signed)

## 2020-03-14 ENCOUNTER — Other Ambulatory Visit: Payer: Self-pay

## 2020-03-14 ENCOUNTER — Ambulatory Visit: Payer: Medicare Other | Admitting: Orthopedic Surgery

## 2020-03-14 ENCOUNTER — Encounter: Payer: Self-pay | Admitting: Orthopedic Surgery

## 2020-03-14 VITALS — Ht 66.0 in | Wt 174.0 lb

## 2020-03-14 DIAGNOSIS — M19071 Primary osteoarthritis, right ankle and foot: Secondary | ICD-10-CM

## 2020-03-14 MED ORDER — METHYLPREDNISOLONE ACETATE 40 MG/ML IJ SUSP
40.0000 mg | INTRAMUSCULAR | Status: AC | PRN
  Administered 2020-03-14: 40 mg via INTRA_ARTICULAR

## 2020-03-14 MED ORDER — LIDOCAINE HCL 1 % IJ SOLN
1.0000 mL | INTRAMUSCULAR | Status: AC | PRN
  Administered 2020-03-14: 1 mL

## 2020-03-14 NOTE — Progress Notes (Signed)
Office Visit Note   Patient: Maria [name]           Date of Birth: 01-31-1936           MRN: [id number] Visit Date: 03/14/2020              Requested by: Mosie Lukes, MD [address] [place],  [place] [address] PCP: Mosie Lukes, MD  Chief Complaint  Patient presents with  . Right Foot - Pain      HPI: Patient is a 84 year old woman complaining of pain over the sinus Tarsi of the right foot with prolonged activities.  States she has occasional swelling.  Assessment & Plan: Visit Diagnoses:  1. Arthritis of right subtalar joint     Plan: Subtalar joint was injected she did have good immediate relief of her symptoms.  Follow-Up Instructions: Return if symptoms worsen or fail to improve.   Ortho Exam  Patient is alert, oriented, no adenopathy, well-dressed, normal affect, normal respiratory effort. Examination patient is a good dorsalis pedis pulse she has good ankle good subtalar motion subtalar motion is painful pain is reproduced with palpation over the sinus Tarsi.  Base of the fifth metatarsal is nontender to palpation.  Imaging: No results found. No images are attached to the encounter.  Labs: Lab Results  Component Value Date   HGBA1C 5.6 02/13/2014   HGBA1C 5.8 07/14/2013   HGBA1C 5.7 01/22/2009   ESRSEDRATE 13 07/07/2019   ESRSEDRATE 7 10/23/2008   CRP <1.0 07/07/2019   LABURIC 6.9 07/07/2019   LABURIC 6.4 01/22/2009   LABURIC 7.2 (H) 10/23/2008   REPTSTATUS 11/05/2016 FINAL 11/03/2016   CULT >=100,000 COLONIES/mL ESCHERICHIA COLI (A) 11/03/2016   LABORGA ESCHERICHIA COLI 11/16/2016     Lab Results  Component Value Date   ALBUMIN 4.4 02/13/2020   ALBUMIN 4.2 01/05/2020   ALBUMIN 4.2 11/21/2019   LABURIC 6.9 07/07/2019   LABURIC 6.4 01/22/2009   LABURIC 7.2 (H) 10/23/2008    No results found for: MG Lab Results  Component Value Date   VD25OH 42.47 07/08/2018   VD25OH 38.10 07/22/2017   VD25OH 30.44 06/16/2016     No results found for: PREALBUMIN CBC EXTENDED Latest Ref Rng & Units 02/13/2020 01/05/2020 11/21/2019  WBC 4.0 - 10.5 K/uL 7.5 6.8 8.0  RBC 3.87 - 5.11 MIL/uL 4.90 4.53 4.90  HGB 12.0 - 15.0 g/dL 14.9 14.1 15.1(H)  HCT 36.0 - 46.0 % 44.5 41.9 44.5  PLT 150 - 400 K/uL 276 260 277  NEUTROABS 1.7 - 7.7 K/uL 3.5 3.6 3.7  LYMPHSABS 0.7 - 4.0 K/uL 3.0 2.0 3.2     Body mass index is 28.08 kg/m.  Orders:  No orders of the defined types were placed in this encounter.  No orders of the defined types were placed in this encounter.    Procedures: Small Joint Inj: R subtalar on 03/14/2020 11:03 AM Indications: pain and diagnostic evaluation Details: 22 G needle, dorsal approach  Spinal Needle: No  Medications: 1 mL lidocaine 1 %; 40 mg methylPREDNISolone acetate 40 MG/ML Outcome: tolerated well, no immediate complications Procedure, treatment alternatives, risks and benefits explained, specific risks discussed. Consent was given by the patient. Immediately prior to procedure a time out was called to verify the correct patient, procedure, equipment, support staff and site/side marked as required. Patient was prepped and draped in the usual sterile fashion.      Clinical Data: No additional findings.  ROS:  All  other systems negative, except as noted in the HPI. Review of Systems  Objective: Vital Signs: Ht 5\' 6"  (1.676 m)   Wt 174 lb (78.9 kg)   BMI 28.08 kg/m   Specialty Comments:  No specialty comments available.  PMFS History: Patient Active Problem List   Diagnosis Date Noted  . HTN (hypertension) 11/08/2019  . Hemochromatosis 09/06/2019  . Educated about COVID-19 virus infection 03/27/2019  . History of total knee replacement, left 11/04/2018  . Preventative health care 06/16/2016  . Knee pain 06/16/2016  . OSA (obstructive sleep apnea) 06/15/2016  . Depression 05/14/2016  . Multiple thyroid nodules 11/24/2015  . Degenerative arthritis of lumbar spine 09/03/2014   . Hyponatremia 12/12/2013  . Hypokalemia 12/12/2013  . OA (osteoarthritis) of knee 12/11/2013  . Osteoarthritis 11/14/2013  . BENIGN POSITIONAL VERTIGO 01/17/2010  . Urinary incontinence 04/26/2009  . Hyperlipidemia, mixed 02/04/2009  . Vitamin D deficiency 10/23/2008  . POLYARTHRALGIA 10/23/2008  . Osteoporosis 10/23/2008  . NEOPLASM, MALIGNANT, LUNG, NON-SMALL CELL 11/21/2007  . DIVERTICULOSIS 06/15/2007  . GOITER, NODULAR 04/14/2007   Past Medical History:  Diagnosis Date  . Complication of anesthesia    slow to awaken  . Depression   . Diverticulosis   . Goiter, nodular    [place] of thyroid stable 7/08  . Headache    hx of  . Heart murmur    due to rheumatic fever as child  . Hemochromatosis, hereditary ([place]) 09/01/2019  . Mitral valve prolapse   . Multiple thyroid nodules 11/24/2015  . Neoplasm of lung, malignant ([facility]) 2002   Non-small cell surgical tx  . Osteoarthritis 11/14/2013   2010 intra-articular steroids X 3 S/P Physical Therapy in 12/14 TKR 12/11/13   . Osteopenia    Dr Pamala Hurry  . PONV (postoperative nausea and vomiting)    n/v  . Sleep apnea   . Vitamin D deficiency     Family History  Problem Relation Age of Onset  . COPD Mother   . Coronary artery disease Father   . Colon cancer Father 15  . Heart attack Father 56  . Stroke Father        > 29  . Lung cancer Paternal Aunt        smoker  . Cancer Paternal Aunt        lung  . Lung cancer Maternal Grandfather        smoker  . COPD Maternal Grandfather   . Arthritis Sister   . Lung cancer Maternal Uncle        smoker  . Diabetes Neg Hx     Past Surgical History:  Procedure Laterality Date  . ABDOMINAL HYSTERECTOMY  1998   No BSO, for dysfunctional menses  . BLADDER SUSPENSION Bilateral   . BREAST BIOPSY Right   . COLONOSCOPY  2009 , 2014   Diverticulosis; Dr. Olevia Perches  . LOBECTOMY  12/02   RU; no radiation or chemo, Dr. Arlyce Dice  . TONSILLECTOMY    . TOTAL KNEE ARTHROPLASTY Right 12/11/2013    Procedure: RIGHT TOTAL KNEE ARTHROPLASTY;  Surgeon: Gearlean Alf, MD;  Location: [facility];  Service: Orthopedics;  Laterality: Right;  . TOTAL KNEE ARTHROPLASTY Left 06/28/2017   Procedure: LEFT TOTAL KNEE ARTHROPLASTY;  Surgeon: Gaynelle Arabian, MD;  Location: [facility];  Service: Orthopedics;  Laterality: Left;  Adductor Block   Social History   Occupational History  . Occupation: Retired    Fish farm manager: RETIRED  Tobacco Use  . Smoking status: Former Smoker  Years: 20.00    Types: Cigarettes    Quit date: 05/03/1979    Years since quitting: 40.8  . Smokeless tobacco: Never Used  . Tobacco comment: smoked 1962-1980, up to < 1 ppd  Substance and Sexual Activity  . Alcohol use: Yes    Alcohol/week: 7.0 standard drinks    Types: 7 Standard drinks or equivalent per week    Comment: occasional  . Drug use: No  . Sexual activity: Never    Comment: lives alone, dog, no dietary restrictions, eating heart healthy

## 2020-03-19 ENCOUNTER — Encounter: Payer: Self-pay | Admitting: Neurology

## 2020-03-21 ENCOUNTER — Ambulatory Visit: Payer: Medicare Other | Admitting: Neurology

## 2020-03-21 ENCOUNTER — Other Ambulatory Visit: Payer: Self-pay

## 2020-03-21 ENCOUNTER — Encounter: Payer: Self-pay | Admitting: Neurology

## 2020-03-21 VITALS — BP 140/89 | HR 83 | Ht 65.5 in | Wt 170.3 lb

## 2020-03-21 DIAGNOSIS — G4733 Obstructive sleep apnea (adult) (pediatric): Secondary | ICD-10-CM | POA: Diagnosis not present

## 2020-03-21 DIAGNOSIS — Z9989 Dependence on other enabling machines and devices: Secondary | ICD-10-CM

## 2020-03-21 NOTE — Patient Instructions (Addendum)
Please continue using your CPAP regularly. While your insurance requires that you use CPAP at least 4 hours each night on 70% of the nights, I recommend, that you not skip any nights and use it throughout the night if you can. Getting used to CPAP and staying with the treatment long term does take time and patience and discipline. Untreated obstructive sleep apnea when it is moderate to severe can have an adverse impact on cardiovascular health and raise her risk for heart disease, arrhythmias, hypertension, congestive heart failure, stroke and diabetes. Untreated obstructive sleep apnea causes sleep disruption, nonrestorative sleep, and sleep deprivation. This can have an impact on your day to day functioning and cause daytime sleepiness and impairment of cognitive function, memory loss, mood disturbance, and problems focussing. Using CPAP regularly can improve these symptoms.  Keep up the good work with your CPAP! I will see you in 1 year. Drive safely!

## 2020-03-21 NOTE — Progress Notes (Signed)
Subjective:    [name] ID: Maria [name] is a 84 y.o. female.  HPI     Interim history:   Maria [name] is a very pleasant 84 year old right-handed lady with an underlying medical history non-small cell lung cancer, status post lobectomy in 2002, hypertension, depression, thyroid nodules, arthritis of both knees, status post right total knee arthroplasty, hyponatremia, hypokalemia, hyperlipidemia, vitamin D deficiency, osteoporosis, and overweight state, who presents for follow-up consultation of Maria [name] sleep apnea, established on CPAP therapy. Maria [name] is unaccompanied today and presents for Maria [name] yearly checkup. Maria [name] last saw Maria [name] on 03/21/19, At which time Maria [name] was compliant with Maria [name] CPAP and doing well.  Maria [name] had ongoing issues with left knee pain and lower back pain as well as hip pain.  Maria [name] has been going to restart physical therapy.  Maria [name] was advised to follow-up routinely in 1 year for sleep apnea.  Today, 03/21/2020, Maria [name] reviewed Maria [name] CPAP compliance data from 02/19/2020 through 03/19/2020, which is a total of 30 days, during which time Maria [name] used Maria [name] machine 27 days with percent use days greater than 4 hours at 90%, indicating excellent compliance with an average usage of 7 hours and 46 minutes for days on treatment, residual AHI at goal at 1.9/h, leak on Maria higher side with a 95th percentile at 19.5 L/min on a pressure of 7 cm with EPR of 3.  Maria [name] reports doing well with Maria [name] CPAP, Maria [name] is compliant with treatment, rarely skips a night.  Maria [name] likes to travel, typically by [place] now, Maria [name] plans a trip to [place] in October 2021 and will drive with a friend, not alone.  Maria [name] has been diagnosed with hemochromatosis and had to have phlebotomies.  Maria [name] is tolerating this well.  Maria [name] had traveled to Maria [name] hometown, [place] a few times and had seen Maria [name] sister.  Maria [name] is overall doing well, still has back issues and sees Dr. Hulan Saas, has also seen chiropractor and had also had some acupuncture done.  If needed, Maria [name]  walks with a walker.  Maria [name]'s allergies, current medications, family history, past medical history, past social history, past surgical history and problem list were reviewed and updated as appropriate.    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  Maria [name] had a total knee replacement on Maria left in August 2018.  Maria [name] was traveling to [place].     Maria [name] reviewed Maria [name] CPAP compliance data from 02/18/2019 through 03/19/2019 which is a total of 30 days, during which time Maria [name] used Maria [name] machine every night with percent used days greater than 4 hours at 93%, indicating excellent compliance with an average usage of 6 hours and 55 minutes, residual AHI at goal at 1.6/h, leak acceptable with a 95th percentile at 12.7 L/min on a pressure of 7 cm with EPR of 3.     Maria [name] saw Maria [name] on 02/11/2017, at which time Maria [name] reported doing well with CPAP, Maria [name] was sleeping well with it. Maria [name] was complaining of left knee pain, Maria [name] was status post right total knee replacement.    Maria [name] reviewed Maria [name] CPAP compliance data from Maria last 30 days from 02/12/2018 through 03/13/2018 which is 30 days, during which time Maria [name] used Maria [name] CPAP 24 days with percent used days greater than 4 hours at 63%, indicating suboptimal compliance with an average usage of  6 hours and 36 minutes 4 days on treatment, residual AHI at goal at 1.2 per hour, leak acceptable with Maria 95th percentile at 13.6 L/m on a pressure of 7 cm with EPR of 3. In Maria past 90 days Maria [name] has had a 4 hour or more compliant percentage of 70%.    Maria [name] saw Maria [name] on 08/12/2016, at which time we talked about Maria [name] sleep study results from Maria [name] baseline sleep study from 05/18/2016 as well as Maria [name] CPAP titration results from 06/05/2016. Maria [name] was established on CPAP therapy and compliant with it. Maria [name] reported that Maria [name] regarding Maria [name] sleep with more restful sleep, better sleep  consolidation. Maria [name] was planning to take Maria [name] CPAP machine to Maria [name] trip to [place]. Stays busy, particularly with Maria [name] young grandchildren, Maria [name] older son's kids (from his second marriage). Maria [name] has 2 sons (in their 79s).   Maria [name] reviewed Maria [name] CPAP compliance data from 01/10/2017 through 02/08/2017, which is a total of 30 days, during which time Maria [name] used Maria [name] machine 29 days with percent used days greater than 4 hours at 93%, indicating excellent compliance with an average usage of 6 hours and 21 minutes, residual AHI 1.8 per hour, leak on Maria higher side for Maria 95th percentile at 23 L/m, pressure of 7 cm with EPR of 3.    Maria [name] first met Maria [name] on 04/28/2016 at Maria request of Maria [name] primary care physician, at which time Maria [name] reported snoring and excessive daytime somnolence, morning headaches, sleep disruption and nonrestorative sleep. Maria [name] invited Maria [name] back for sleep study. Maria [name] had a baseline sleep study, followed by a CPAP titration study.  Baseline sleep study from 05/18/2016 showed a sleep efficiency of 70.9%, latency to sleep was prolonged at 26 minutes and wake after sleep onset was increased at 91 minutes. Maria [name] had an increased percentage of light stage sleep, slow-wave sleep was 30.5% and there was no REM sleep. Maria [name] had occasional PVCs on EKG. Maria [name] had mild snoring. Total AHI was elevated at 28.7 per hour. Average oxygen saturation was only 89%, Maria [name] was treated with supplemental oxygen which was started and 11:55 PM. Based on Maria [name] sleep-related complaints and Maria [name] test results Maria [name] invited Maria [name] for a full night CPAP titration study.    Maria [name] had Maria [name] CPAP study on 06/05/2016. Sleep efficiency was 75.9%, sleep latency was 26 minutes and wake after sleep onset was 71.5 minutes. Maria [name] had an increased percentage of slow-wave sleep and REM sleep was 6.4%, REM latency markedly prolonged at 226.5 minutes. Maria [name] had no significant PLMS. CPAP was titrated from 5 cm to 7 cm. AHI was 0 per hour at Maria final pressure. Average oxygen saturation was  92%, nadir was 87%. Time below 88% saturation was less than 1 minute. Based on Maria [name] test results are prescribed CPAP therapy for home use.   Maria [name] reviewed Maria [name] CPAP compliance data from 07/12/2016 through 08/10/2016 which is a total of 30 days, during which time Maria [name] used Maria [name] machine 29 days with percent used days greater than 4 hours at 93%, indicating excellent compliance with an average usage of 5 hours and 55 minutes, residual AHI 2.3 per hour, leak on Maria higher side with Maria 95th percentile at 22.4 L/m on a pressure of 7 cm with EPR of 3.   04/28/2016: Maria [name] reports snoring, excessive daytime somnolence, nonrestorative sleep, morning headaches and sleep disruption. Maria [name] reviewed your office note from 04/15/2016. Maria [name] is s/p R TKA in 2015 and has L knee pain, s/p multiple knee injection, sees Dr.  Alusio.  Maria [name] goes to bed around 9:30 to 10 PM. Maria [name] has occasional morning headaches, not enough to take medication, but BP has been a little up in Maria AMs. Maria [name] denies restless leg symptoms. Maria [name] has a rise time around 5 AM. Maria [name] goes to Maria bathroom once or twice per average night. Maria [name] lives alone. Maria [name] is divorced for about 41 years. Maria [name] is retired from [employer]. Maria [name] has 2 grown children and 6 grandchildren. Maria [name] likes to travel. Maria [name] stays very active, walks about 2 miles per day. Maria [name] drinks alcohol occasionally, usually 2 cups of coffee per day, quit smoking in 1980. Maria [name] recently started Cymbalta 30 mg once daily about 2 weeks ago and feels improved in Maria [name] mood. Maria [name] has no TV in Maria bedroom. Maria [name] has a small dog sleeps at Maria end of Maria bed in a queen size bed. Maria [name] is not aware of any family history of obstructive sleep apnea. Maria [name] main concern is that Maria [name] is tired during Maria day and does not wake up rested. Maria [name] Epworth sleepiness score is 12 out of 24 today, Maria [name] fatigue score is 33 out of 63.  Maria [name] Past Medical History Is Significant For: Past Medical History:  Diagnosis Date  . Complication of anesthesia    slow  to awaken  . Depression   . Diverticulosis   . Goiter, nodular    [place] of thyroid stable 7/08  . Headache    hx of  . Heart murmur    due to rheumatic fever as child  . Hemochromatosis, hereditary ([facility]) 09/01/2019  . Mitral valve prolapse   . Multiple thyroid nodules 11/24/2015  . Neoplasm of lung, malignant ([facility]) 2002   Non-small cell surgical tx  . Osteoarthritis 11/14/2013   2010 intra-articular steroids X 3 S/P Physical Therapy in 12/14 TKR 12/11/13   . Osteopenia    Dr Pamala Hurry  . PONV (postoperative nausea and vomiting)    n/v  . Sleep apnea   . Vitamin D deficiency     Maria [name] Past Surgical History Is Significant For: Past Surgical History:  Procedure Laterality Date  . ABDOMINAL HYSTERECTOMY  1998   No BSO, for dysfunctional menses  . BLADDER SUSPENSION Bilateral   . BREAST BIOPSY Right   . COLONOSCOPY  2009 , 2014   Diverticulosis; Dr. Olevia Perches  . LOBECTOMY  12/02   RU; no radiation or chemo, Dr. Arlyce Dice  . TONSILLECTOMY    . TOTAL KNEE ARTHROPLASTY Right 12/11/2013   Procedure: RIGHT TOTAL KNEE ARTHROPLASTY;  Surgeon: Gearlean Alf, MD;  Location: [facility];  Service: Orthopedics;  Laterality: Right;  . TOTAL KNEE ARTHROPLASTY Left 06/28/2017   Procedure: LEFT TOTAL KNEE ARTHROPLASTY;  Surgeon: Gaynelle Arabian, MD;  Location: [facility];  Service: Orthopedics;  Laterality: Left;  Adductor Block    Maria [name] Family History Is Significant For: Family History  Problem Relation Age of Onset  . COPD Mother   . Coronary artery disease Father   . Colon cancer Father 29  . Heart attack Father 44  . Stroke Father        > 62  . Lung cancer Paternal Aunt        smoker  . Cancer Paternal Aunt        lung  . Lung cancer Maternal Grandfather        smoker  . COPD Maternal Grandfather   . Arthritis Sister   . Lung cancer Maternal Uncle        smoker  .  Diabetes Neg Hx     Maria [name] Social History Is Significant For: Social History   Socioeconomic History  . Marital status: Divorced     Spouse name: Not on file  . Number of children: Not on file  . Years of education: college  . Highest education level: Not on file  Occupational History  . Occupation: Retired    Fish farm manager: RETIRED  Tobacco Use  . Smoking status: Former Smoker    Years: 20.00    Types: Cigarettes    Quit date: 05/03/1979    Years since quitting: 40.9  . Smokeless tobacco: Never Used  . Tobacco comment: smoked 1962-1980, up to < 1 ppd  Substance and Sexual Activity  . Alcohol use: Yes    Alcohol/week: 7.0 standard drinks    Types: 7 Standard drinks or equivalent per week    Comment: occasional  . Drug use: No  . Sexual activity: Never    Comment: lives alone, dog, no dietary restrictions, eating heart healthy  Other Topics Concern  . Not on file  Social History Narrative   Pt gets regular exercise.   Drinks 2 cups of coffee a day    Social Determinants of Radio broadcast assistant Strain:   . Difficulty of Paying Living Expenses:   Food Insecurity:   . Worried About Charity fundraiser in [place] Last Year:   . Arboriculturist in [place] Last Year:   Transportation Needs:   . Film/video editor (Medical):   Marland Kitchen Lack of Transportation (Non-Medical):   Physical Activity:   . Days of Exercise per Week:   . Minutes of Exercise per Session:   Stress:   . Feeling of Stress :   Social Connections:   . Frequency of Communication with Friends and Family:   . Frequency of Social Gatherings with Friends and Family:   . Attends Religious Services:   . Active Member of Clubs or Organizations:   . Attends Archivist Meetings:   Marland Kitchen Marital Status:     Maria [name] Allergies Are:  No Known Allergies:   Maria [name] Current Medications Are:  Outpatient Encounter Medications as of 03/21/2020  Medication Sig  . amLODipine (NORVASC) 5 MG tablet TAKE 1.5 TABLETS (7.5 MG TOTAL) BY MOUTH DAILY.  . DULoxetine (CYMBALTA) 30 MG capsule TAKE 1 CAPSULE BY MOUTH EVERY DAY  . gabapentin (NEURONTIN) 100 MG capsule Take 2  capsules (200 mg total) by mouth at bedtime.  Marland Kitchen ibuprofen (ADVIL) 200 MG tablet Take 200 mg by mouth every 6 (six) hours as needed for headache.  . traMADol (ULTRAM) 50 MG tablet Take 1 tablet (50 mg total) by mouth 2 (two) times daily as needed.  . [DISCONTINUED] gabapentin (NEURONTIN) 100 MG capsule Take 2 capsules (200 mg total) by mouth at bedtime.   No facility-administered encounter medications on file as of 03/21/2020.  :  Review of Systems:  Out of a complete 14 point review of systems, all are reviewed and negative with Maria exception of these symptoms as listed below:  Review of Systems  Neurological:       1 year f/u for cpap. Reports machine had been working well for Maria [name]. No complaints.     Objective:  Neurological Exam  Physical Exam Physical Examination:   Vitals:   03/21/20 1051  BP: 140/89  Pulse: 83    General Examination: Maria [name] is a very pleasant 84 y.o. female in no acute distress. Maria [name] appears well-developed and well-nourished  and well groomed.   HEENT:Normocephalic, atraumatic, pupils are equal, round and reactive to light, Maria [name] is status post cataract repairs, Maria [name] has corrective eyeglasses in place, extraocular tracking is good, hearing is grossly intact.Face is symmetric with normal facial animation and normal facial sensation. Speech is clear with no dysarthria noted. There is no hypophonia. There is no lip, neck/head, jaw or voice tremor. Neck with FROM. No carotid bruits. Oropharynx exam (we removed Maria [name] mask briefly) reveals:no signif. mouth dryness, adequatedental hygiene and moderateairway crowding. Mallampati is class II. Tongue protrudes centrally and palate elevates symmetrically. Tonsils are absent.   Chest:Clear to auscultation without wheezing, rhonchi or crackles noted.  Heart:S1+S2+0, regular and normal without murmurs, rubs or gallops noted.   Abdomen:Soft, non-tender and non-distended with normal bowel sounds appreciated on  auscultation.  Extremities:There isnopitting edema in Maria distal lower extremities bilaterally.   Skin: Warm and dry without trophic changes noted.  Musculoskeletal: exam reveals no obvious joint deformities, tenderness or joint swelling or erythema,status postb/lknee replacement surgeries, mildly larger L knee.   Mild increase in lumbar kyphosis when Maria [name] stands up.  Neurologically:  Mental status: Maria [name] is awake, alert and oriented in all 4 spheres.Herimmediate and remote memory, attention, language skills and fund of knowledge are appropriate. There is no evidence of aphasia, agnosia, apraxia or anomia. Speech is clear with normal prosody and enunciation. Thought process is linear. Mood is normaland affect is normal.  Cranial nerves II - XII are as described above under HEENT exam. Motor exam: Normal bulk, strength and tone is noted. There is no tremor. Fine motor skills and coordination: grossly intact.  Cerebellar testing: No dysmetria or intention tremor. There is no truncal or gait ataxia.  Sensory exam: intact to light touch in Maria upper and lower extremities.  Gait, station and balance:Shestands withno significantdifficulty.Posture is stooped, Maria [name] has increased lumbar kyphosisand probably some scoliosis, stable appearing. Maria [name] walks with a limp on Maria R, has no cane. Tandem walk is not possible, Romberg was not tested for safety reasons.   Assessmentand Plan:   In summary,Maria B Aldridgeis a very pleasant63 year oldfemalewith an underlying medical history non-small cell lung cancer (s/p lobectomy in 2002), hypertension, depression, thyroid nodules, arthritisof both knees,status post right total knee arthroplasty, hyponatremia, hypokalemia, hyperlipidemia, vitamin D deficiency, osteoporosis, and overweight state, who presents for follow-up consultation of Maria [name] sleep apnea for Maria [name] yearly checkup, well established on CPAP therapy at a pressure  of 7 cm. Maria [name] is tolerating Maria treatment, Maria [name] feels well with regards to Maria [name] sleep. Shecontinues to be compliant with it and iscommended for Maria [name] CPAP adherence. Of note, Maria [name] sleep study from 05/18/2016 showed moderate to severe obstructive sleep apnea. Maria [name] is up-to-date with his supplies and motivated to continue with treatment. Maria [name] has benefited from sleep apnea treatment and reports sleeping well with it now.  Maria [name] advised Maria [name] to follow-up routinely in 1 year, sooner if needed.  Maria [name] answered all Maria [name] questions today and Maria [name] was in agreement. Maria [name] spent 20 minutes in total face-to-face time and in reviewing records during pre-charting, more than 50% of which was spent in counseling and coordination of care, reviewing test results, reviewing medications and treatment regimen and/or in discussing or reviewing Maria diagnosis of OSA, Maria prognosis and treatment options. Pertinent laboratory and imaging test results that were available during this visit with Maria [name] were reviewed by me and considered in my medical decision making (see chart for details).

## 2020-03-21 NOTE — Progress Notes (Signed)
Subjective:   Maria [name] is a 84 y.o. female who presents for Medicare Annual (Subsequent) preventive examination.  Enjoys reading, watching PBS, and having wine w/ friends by the fountain.  Review of Systems:  Home Safety/Smoke Alarms: Feels safe in home. Smoke alarms in place.  Lives alone w/ dog in 1st floor condo.   Female:   Mammo- 12/12/18   Pt states she has scheduled 04/04/20.   Dexa scan-  Pt states she has scheduled 04/04/20.             Objective:     Vitals: BP 138/80 (BP Location: Left Arm, Patient Position: Sitting, Cuff Size: Normal)   Pulse 80   Temp (!) 97 F (36.1 C) (Temporal)   Ht 5\' 6"  (1.676 m)   Wt 168 lb 12.8 oz (76.6 kg)   SpO2 97%   BMI 27.25 kg/m   Body mass index is 27.25 kg/m.  Advanced Directives 03/25/2020 02/13/2020 01/05/2020 11/21/2019 09/13/2019 08/28/2019 03/24/2019  Does Patient Have a Medical Advance Directive? Yes Yes Yes Yes Yes Yes Yes  Type of Paramedic of [place];Living will [place];Living will [place];Living will [place];Living will Living will Living will;Healthcare Power of [place];Living will  Does patient want to make changes to medical advance directive? No - Patient declined No - Patient declined No - Patient declined No - Patient declined - - No - Patient declined  Copy of [place] in Chart? Yes - validated most recent copy scanned in chart (See row information) Yes - validated most recent copy scanned in chart (See row information) Yes - validated most recent copy scanned in chart (See row information) Yes - validated most recent copy scanned in chart (See row information) - No - copy requested Yes - validated most recent copy scanned in chart (See row information)  Pre-existing out of facility DNR order (yellow form or pink MOST form) - - - - - - -    Tobacco Social History   Tobacco  Use  Smoking Status Former Smoker  . Years: 20.00  . Types: Cigarettes  . Quit date: 05/03/1979  . Years since quitting: 40.9  Smokeless Tobacco Never Used  Tobacco Comment   smoked 1962-1980, up to < 1 ppd     Counseling given: Not Answered Comment: smoked 1962-1980, up to < 1 ppd   Clinical Intake: Pain : No/denies pain      Past Medical History:  Diagnosis Date  . Complication of anesthesia    slow to awaken  . Depression   . Diverticulosis   . Goiter, nodular    [place] of thyroid stable 7/08  . Headache    hx of  . Heart murmur    due to rheumatic fever as child  . Hemochromatosis, hereditary ([place]) 09/01/2019  . Mitral valve prolapse   . Multiple thyroid nodules 11/24/2015  . Neoplasm of lung, malignant ([place]) 2002   Non-small cell surgical tx  . Osteoarthritis 11/14/2013   2010 intra-articular steroids X 3 S/P Physical Therapy in 12/14 TKR 12/11/13   . Osteopenia    Dr Pamala Hurry  . PONV (postoperative nausea and vomiting)    n/v  . Sleep apnea   . Vitamin D deficiency    Past Surgical History:  Procedure Laterality Date  . ABDOMINAL HYSTERECTOMY  1998   No BSO, for dysfunctional menses  . BLADDER SUSPENSION Bilateral   .  BREAST BIOPSY Right   . COLONOSCOPY  2009 , 2014   Diverticulosis; Dr. Olevia Perches  . LOBECTOMY  12/02   RU; no radiation or chemo, Dr. Arlyce Dice  . TONSILLECTOMY    . TOTAL KNEE ARTHROPLASTY Right 12/11/2013   Procedure: RIGHT TOTAL KNEE ARTHROPLASTY;  Surgeon: Gearlean Alf, MD;  Location: [facility];  Service: Orthopedics;  Laterality: Right;  . TOTAL KNEE ARTHROPLASTY Left 06/28/2017   Procedure: LEFT TOTAL KNEE ARTHROPLASTY;  Surgeon: Gaynelle Arabian, MD;  Location: [facility];  Service: Orthopedics;  Laterality: Left;  Adductor Block   Family History  Problem Relation Age of Onset  . COPD Mother   . Coronary artery disease Father   . Colon cancer Father 52  . Heart attack Father 22  . Stroke Father        > 55  . Lung cancer Paternal Aunt         smoker  . Cancer Paternal Aunt        lung  . Lung cancer Maternal Grandfather        smoker  . COPD Maternal Grandfather   . Arthritis Sister   . Lung cancer Maternal Uncle        smoker  . Diabetes Neg Hx    Social History   Socioeconomic History  . Marital status: Divorced    Spouse name: Not on file  . Number of children: Not on file  . Years of education: college  . Highest education level: Not on file  Occupational History  . Occupation: Retired    Fish farm manager: RETIRED  Tobacco Use  . Smoking status: Former Smoker    Years: 20.00    Types: Cigarettes    Quit date: 05/03/1979    Years since quitting: 40.9  . Smokeless tobacco: Never Used  . Tobacco comment: smoked 1962-1980, up to < 1 ppd  Substance and Sexual Activity  . Alcohol use: Yes    Alcohol/week: 7.0 standard drinks    Types: 7 Standard drinks or equivalent per week    Comment: occasional  . Drug use: No  . Sexual activity: Never    Comment: lives alone, dog, no dietary restrictions, eating heart healthy  Other Topics Concern  . Not on file  Social History Narrative   Pt gets regular exercise.   Drinks 2 cups of coffee a day    Social Determinants of Radio broadcast assistant Strain:   . Difficulty of Paying Living Expenses:   Food Insecurity:   . Worried About Charity fundraiser in the Last Year:   . Arboriculturist in the Last Year:   Transportation Needs:   . Film/video editor (Medical):   Marland Kitchen Lack of Transportation (Non-Medical):   Physical Activity:   . Days of Exercise per Week:   . Minutes of Exercise per Session:   Stress:   . Feeling of Stress :   Social Connections:   . Frequency of Communication with Friends and Family:   . Frequency of Social Gatherings with Friends and Family:   . Attends Religious Services:   . Active Member of Clubs or Organizations:   . Attends Archivist Meetings:   Marland Kitchen Marital Status:     Outpatient Encounter Medications as of 03/25/2020    Medication Sig  . acetaminophen (TYLENOL) 500 MG tablet Take 500 mg by mouth every 6 (six) hours as needed.  Marland Kitchen amLODipine (NORVASC) 5 MG tablet TAKE 1.5 TABLETS (7.5 MG TOTAL)  BY MOUTH DAILY.  . DULoxetine (CYMBALTA) 30 MG capsule TAKE 1 CAPSULE BY MOUTH EVERY DAY  . gabapentin (NEURONTIN) 100 MG capsule Take 2 capsules (200 mg total) by mouth at bedtime.  Marland Kitchen ibuprofen (ADVIL) 200 MG tablet Take 200 mg by mouth every 6 (six) hours as needed for headache.  . traMADol (ULTRAM) 50 MG tablet Take 1 tablet (50 mg total) by mouth 2 (two) times daily as needed.  . [DISCONTINUED] gabapentin (NEURONTIN) 100 MG capsule Take 2 capsules (200 mg total) by mouth at bedtime.   No facility-administered encounter medications on file as of 03/25/2020.    Activities of Daily Living In your present state of health, do you have any difficulty performing the following activities: 03/25/2020  Hearing? N  Vision? N  Difficulty concentrating or making decisions? N  Walking or climbing stairs? N  Dressing or bathing? N  Doing errands, shopping? N  Preparing Food and eating ? N  Using the Toilet? N  In the past six months, have you accidently leaked urine? Y  Do you have problems with loss of bowel control? N  Managing your Medications? N  Managing your Finances? N  Housekeeping or managing your Housekeeping? N  Some recent data might be hidden    Patient Care Team: Mosie Lukes, MD as PCP - General (Family Medicine) Gaynelle Arabian, MD as Consulting Physician (Orthopedic Surgery) Crista Luria, MD as Consulting Physician (Dermatology) Aloha Gell, MD as Consulting Physician (Obstetrics and Gynecology) Star Age, MD as Consulting Physician (Neurology)    Assessment:   This is a routine wellness examination for [place]. Physical assessment deferred to PCP.  Exercise Activities and Dietary recommendations Current Exercise Habits: Home exercise routine, Type of exercise: walking, Time (Minutes):  40, Frequency (Times/Week): 7, Weekly Exercise (Minutes/Week): 280, Intensity: Mild, Exercise limited by: None identified Diet (meal preparation, eat out, water intake, caffeinated beverages, dairy products, fruits and vegetables): in general, a "healthy" diet  , well balanced   Goals    . maintain healthy weight and healthy active lifestyle and independence.     . Patient stated     Decrease back pain through improved posture, strength, and alignment through exercise, chiropractor, and maybe starting acupuncture.       Fall Risk Fall Risk  03/25/2020 03/24/2019 03/16/2018 11/16/2016 05/14/2016  Falls in the past year? 0 0 No No Yes  Number falls in past yr: 0 - - - 1  Injury with Fall? 0 - - - No  Follow up Education provided;Falls prevention discussed - - - -    Depression Screen PHQ 2/9 Scores 03/25/2020 03/24/2019 03/22/2018 11/16/2016  PHQ - 2 Score 0 0 0 0  PHQ- 9 Score - - - -     Cognitive Function    MMSE - Mini Mental State Exam 03/25/2020 05/14/2016  Not completed: Refused Refused        Immunization History  Administered Date(s) Administered  . Fluad Quad(high Dose 65+) 08/15/2019  . Pneumococcal Conjugate-13 11/15/2015  . Pneumococcal Polysaccharide-23 11/21/2013  . Tdap 11/14/2013  . Zoster 07/17/2014     Screening Tests Health Maintenance  Topic Date Due  . COVID-19 Vaccine (1) Never done  . INFLUENZA VACCINE  06/02/2020  . TETANUS/TDAP  11/15/2023  . DEXA SCAN  Completed  . PNA vac Low Risk Adult  Completed      Plan:   See you next year!  Continue to eat heart healthy diet (full of fruits, vegetables, whole grains, lean protein,  water--limit salt, fat, and sugar intake) and increase physical activity as tolerated.  Continue doing brain stimulating activities (puzzles, reading, adult coloring books, staying active) to keep memory sharp.     I have personally reviewed and noted the following in the patient's chart:   . Medical and social  history . Use of alcohol, tobacco or illicit drugs  . Current medications and supplements . Functional ability and status . Nutritional status . Physical activity . Advanced directives . List of other physicians . Hospitalizations, surgeries, and ER visits in previous 12 months . Vitals . Screenings to include cognitive, depression, and falls . Referrals and appointments  In addition, I have reviewed and discussed with patient certain preventive protocols, quality metrics, and best practice recommendations. A written personalized care plan for preventive services as well as general preventive health recommendations were provided to patient.     Naaman Plummer [place], [place]  03/25/2020

## 2020-03-25 ENCOUNTER — Other Ambulatory Visit: Payer: Self-pay

## 2020-03-25 ENCOUNTER — Encounter: Payer: Self-pay | Admitting: *Deleted

## 2020-03-25 ENCOUNTER — Ambulatory Visit (INDEPENDENT_AMBULATORY_CARE_PROVIDER_SITE_OTHER): Payer: Medicare Other | Admitting: *Deleted

## 2020-03-25 VITALS — BP 138/80 | HR 80 | Temp 97.0°F | Ht 66.0 in | Wt 168.8 lb

## 2020-03-25 DIAGNOSIS — Z1211 Encounter for screening for malignant neoplasm of colon: Secondary | ICD-10-CM

## 2020-03-25 DIAGNOSIS — Z0000 Encounter for general adult medical examination without abnormal findings: Secondary | ICD-10-CM | POA: Diagnosis not present

## 2020-03-25 DIAGNOSIS — R194 Change in bowel habit: Secondary | ICD-10-CM

## 2020-03-25 NOTE — Patient Instructions (Signed)
See you next year!  Continue to eat heart healthy diet (full of fruits, vegetables, whole grains, lean protein, water--limit salt, fat, and sugar intake) and increase physical activity as tolerated.  Continue doing brain stimulating activities (puzzles, reading, adult coloring books, staying active) to keep memory sharp.    Maria [name] , Thank you for taking time to come for your Medicare Wellness Visit. I appreciate your ongoing commitment to your health goals. Please review the following plan we discussed and let me know if I can assist you in the future.   These are the goals we discussed: Goals    . maintain healthy weight and healthy active lifestyle and independence.     . Patient stated     Decrease back pain through improved posture, strength, and alignment through exercise, chiropractor, and maybe starting acupuncture.       This is a list of the screening recommended for you and due dates:  Health Maintenance  Topic Date Due  . COVID-19 Vaccine (1) Never done  . Flu Shot  06/02/2020  . Tetanus Vaccine  11/15/2023  . DEXA scan (bone density measurement)  Completed  . Pneumonia vaccines  Completed    Preventive Care 47 Years and Older, Female Preventive care refers to lifestyle choices and visits with your health care provider that can promote health and wellness. This includes:  A yearly physical exam. This is also called an annual well check.  Regular dental and eye exams.  Immunizations.  Screening for certain conditions.  Healthy lifestyle choices, such as diet and exercise. What can I expect for my preventive care visit? Physical exam Your health care provider will check:  Height and weight. These may be used to calculate body mass index (BMI), which is a measurement that tells if you are at a healthy weight.  Heart rate and blood pressure.  Your skin for abnormal spots. Counseling Your health care provider may ask you questions about:  Alcohol,  tobacco, and drug use.  Emotional well-being.  Home and relationship well-being.  Sexual activity.  Eating habits.  History of falls.  Memory and ability to understand (cognition).  Work and work Statistician.  Pregnancy and menstrual history. What immunizations do I need?  Influenza (flu) vaccine  This is recommended every year. Tetanus, diphtheria, and pertussis (Tdap) vaccine  You may need a Td booster every 10 years. Varicella (chickenpox) vaccine  You may need this vaccine if you have not already been vaccinated. Zoster (shingles) vaccine  You may need this after age 55. Pneumococcal conjugate (PCV13) vaccine  One dose is recommended after age 7. Pneumococcal polysaccharide (PPSV23) vaccine  One dose is recommended after age 58. Measles, mumps, and rubella (MMR) vaccine  You may need at least one dose of MMR if you were born in 1957 or later. You may also need a second dose. Meningococcal conjugate (MenACWY) vaccine  You may need this if you have certain conditions. Hepatitis A vaccine  You may need this if you have certain conditions or if you travel or work in places where you may be exposed to hepatitis A. Hepatitis B vaccine  You may need this if you have certain conditions or if you travel or work in places where you may be exposed to hepatitis B. Haemophilus influenzae type b (Hib) vaccine  You may need this if you have certain conditions. You may receive vaccines as individual doses or as more than one vaccine together in one shot (combination vaccines). Talk with your  health care provider about the risks and benefits of combination vaccines. What tests do I need? Blood tests  Lipid and cholesterol levels. These may be checked every 5 years, or more frequently depending on your overall health.  Hepatitis C test.  Hepatitis B test. Screening  Lung cancer screening. You may have this screening every year starting at age 47 if you have a  30-pack-year history of smoking and currently smoke or have quit within the past 15 years.  Colorectal cancer screening. All adults should have this screening starting at age 6 and continuing until age 99. Your health care provider may recommend screening at age 55 if you are at increased risk. You will have tests every 1-10 years, depending on your results and the type of screening test.  Diabetes screening. This is done by checking your blood sugar (glucose) after you have not eaten for a while (fasting). You may have this done every 1-3 years.  Mammogram. This may be done every 1-2 years. Talk with your health care provider about how often you should have regular mammograms.  BRCA-related cancer screening. This may be done if you have a family history of breast, ovarian, tubal, or peritoneal cancers. Other tests  Sexually transmitted disease (STD) testing.  Bone density scan. This is done to screen for osteoporosis. You may have this done starting at age 26. Follow these instructions at home: Eating and drinking  Eat a diet that includes fresh fruits and vegetables, whole grains, lean protein, and low-fat dairy products. Limit your intake of foods with high amounts of sugar, saturated fats, and salt.  Take vitamin and mineral supplements as recommended by your health care provider.  Do not drink alcohol if your health care provider tells you not to drink.  If you drink alcohol: ? Limit how much you have to 0-1 drink a day. ? Be aware of how much alcohol is in your drink. In the [place] one drink equals one 12 oz bottle of beer (355 mL), one 5 oz glass of wine (148 mL), or one 1 oz glass of hard liquor (44 mL). Lifestyle  Take daily care of your teeth and gums.  Stay active. Exercise for at least 30 minutes on 5 or more days each week.  Do not use any products that contain nicotine or tobacco, such as cigarettes, e-cigarettes, and chewing tobacco. If you need help quitting, ask your  health care provider.  If you are sexually active, practice safe sex. Use a condom or other form of protection in order to prevent STIs (sexually transmitted infections).  Talk with your health care provider about taking a low-dose aspirin or statin. What's next?  Go to your health care provider once a year for a well check visit.  Ask your health care provider how often you should have your eyes and teeth checked.  Stay up to date on all vaccines. This information is not intended to replace advice given to you by your health care provider. Make sure you discuss any questions you have with your health care provider. Document Revised: 10/13/2018 Document Reviewed: 10/13/2018 Elsevier Patient Education  2020 [employer].

## 2020-03-25 NOTE — Addendum Note (Signed)
Addended by: Naaman Plummer A on: 03/25/2020 01:02 PM   Modules accepted: Orders

## 2020-03-25 NOTE — Progress Notes (Signed)
GI referral placed per pt request and PCP approval.

## 2020-03-26 ENCOUNTER — Encounter: Payer: Self-pay | Admitting: Gastroenterology

## 2020-04-03 LAB — HM DEXA SCAN

## 2020-04-03 LAB — HM MAMMOGRAPHY

## 2020-04-04 ENCOUNTER — Encounter: Payer: Self-pay | Admitting: *Deleted

## 2020-04-10 ENCOUNTER — Encounter: Payer: Self-pay | Admitting: *Deleted

## 2020-04-16 ENCOUNTER — Ambulatory Visit: Payer: Medicare Other | Admitting: Family

## 2020-04-16 ENCOUNTER — Other Ambulatory Visit: Payer: Medicare Other

## 2020-04-19 ENCOUNTER — Telehealth: Payer: Self-pay | Admitting: Family

## 2020-04-19 ENCOUNTER — Other Ambulatory Visit: Payer: Self-pay

## 2020-04-19 ENCOUNTER — Inpatient Hospital Stay: Payer: Medicare Other | Attending: Hematology & Oncology

## 2020-04-19 ENCOUNTER — Inpatient Hospital Stay (HOSPITAL_BASED_OUTPATIENT_CLINIC_OR_DEPARTMENT_OTHER): Payer: Medicare Other | Admitting: Family

## 2020-04-19 ENCOUNTER — Encounter: Payer: Self-pay | Admitting: Family

## 2020-04-19 DIAGNOSIS — Z79899 Other long term (current) drug therapy: Secondary | ICD-10-CM | POA: Diagnosis not present

## 2020-04-19 DIAGNOSIS — G8929 Other chronic pain: Secondary | ICD-10-CM | POA: Diagnosis not present

## 2020-04-19 DIAGNOSIS — R140 Abdominal distension (gaseous): Secondary | ICD-10-CM | POA: Diagnosis not present

## 2020-04-19 DIAGNOSIS — M7989 Other specified soft tissue disorders: Secondary | ICD-10-CM | POA: Insufficient documentation

## 2020-04-19 DIAGNOSIS — M549 Dorsalgia, unspecified: Secondary | ICD-10-CM | POA: Diagnosis not present

## 2020-04-19 LAB — CBC WITH DIFFERENTIAL (CANCER CENTER ONLY)
Abs Immature Granulocytes: 0.02 10*3/uL (ref 0.00–0.07)
Basophils Absolute: 0 10*3/uL (ref 0.0–0.1)
Basophils Relative: 1 %
Eosinophils Absolute: 0.1 10*3/uL (ref 0.0–0.5)
Eosinophils Relative: 1 %
HCT: 44.2 % (ref 36.0–46.0)
Hemoglobin: 14.7 g/dL (ref 12.0–15.0)
Immature Granulocytes: 0 %
Lymphocytes Relative: 32 %
Lymphs Abs: 2.6 10*3/uL (ref 0.7–4.0)
MCH: 30.6 pg (ref 26.0–34.0)
MCHC: 33.3 g/dL (ref 30.0–36.0)
MCV: 92.1 fL (ref 80.0–100.0)
Monocytes Absolute: 0.9 10*3/uL (ref 0.1–1.0)
Monocytes Relative: 11 %
Neutro Abs: 4.4 10*3/uL (ref 1.7–7.7)
Neutrophils Relative %: 55 %
Platelet Count: 291 10*3/uL (ref 150–400)
RBC: 4.8 MIL/uL (ref 3.87–5.11)
RDW: 12.9 % (ref 11.5–15.5)
WBC Count: 8 10*3/uL (ref 4.0–10.5)
nRBC: 0 % (ref 0.0–0.2)

## 2020-04-19 LAB — CMP (CANCER CENTER ONLY)
ALT: 13 U/L (ref 0–44)
AST: 20 U/L (ref 15–41)
Albumin: 4.3 g/dL (ref 3.5–5.0)
Alkaline Phosphatase: 93 U/L (ref 38–126)
Anion gap: 10 (ref 5–15)
BUN: 17 mg/dL (ref 8–23)
CO2: 27 mmol/L (ref 22–32)
Calcium: 9.3 mg/dL (ref 8.9–10.3)
Chloride: 103 mmol/L (ref 98–111)
Creatinine: 0.65 mg/dL (ref 0.44–1.00)
GFR, Est AFR Am: >60 mL/min (ref >60)
GFR, Estimated: >60 mL/min (ref >60)
Glucose, Bld: 130 mg/dL — ABNORMAL HIGH (ref 70–99)
Potassium: 3.4 mmol/L — ABNORMAL LOW (ref 3.5–5.1)
Sodium: 140 mmol/L (ref 135–145)
Total Bilirubin: 0.7 mg/dL (ref 0.3–1.2)
Total Protein: 7.2 g/dL (ref 6.5–8.1)

## 2020-04-19 NOTE — Telephone Encounter (Signed)
Appointments scheduled calendar printed per 6/18 los

## 2020-04-19 NOTE — Progress Notes (Signed)
Hematology and Oncology Follow Up Visit  Maria [name] [id number] 1936-05-15 84 y.o. 04/19/2020   Principle Diagnosis:  Hemochromatosis, homozygous for the C282Y mutation  Current Therapy: Phlebotomy to maintain iron saturation < 50% and ferritin < 100   Interim History:  Maria [name] is here today for follow-up. She is doing quite well but did trip over her her dog's leash last week and has a bruise by her left eye. Thankfully she was not seriously injured.  No new falls or syncope since that episode.  She has chronic back pain and has started using a walker at home when ambulating so she can walk upright. This has helped her quite a bit.  She has right ankle swelling and foot discomfort and has made an appointment with sports medicine Dr. Creig Hines for further eval. She has done injections in the past which was not helpful.   No fever, chills, n/v, cough, rash, dizziness, SOB, chest pain, palpitations, abdominal pain or changes in bowel or bladder habits.  She has an appointment on 7/1 with gastroenterology Dr. Loletha Carrow to discuss her bloating and inconsistent BM's.  No numbness or tingling in her extremities.  She is eating well and staying properly hydrated. Her weight is stable.   ECOG Performance Status: 1 - Symptomatic but completely ambulatory  Medications:  Allergies as of 04/19/2020   No Known Allergies     Medication List       Accurate as of April 19, 2020 11:58 AM. If you have any questions, ask your nurse or doctor.        acetaminophen 500 MG tablet Commonly known as: TYLENOL Take 500 mg by mouth every 6 (six) hours as needed.   amLODipine 5 MG tablet Commonly known as: NORVASC TAKE 1.5 TABLETS (7.5 MG TOTAL) BY MOUTH DAILY.   DULoxetine 30 MG capsule Commonly known as: CYMBALTA TAKE 1 CAPSULE BY MOUTH EVERY DAY   gabapentin 100 MG capsule Commonly known as: NEURONTIN Take 2 capsules (200 mg total) by mouth at bedtime.   ibuprofen 200 MG  tablet Commonly known as: ADVIL Take 200 mg by mouth every 6 (six) hours as needed for headache.   traMADol 50 MG tablet Commonly known as: ULTRAM Take 1 tablet (50 mg total) by mouth 2 (two) times daily as needed.       Allergies: No Known Allergies  Past Medical History, Surgical history, Social history, and Family History were reviewed and updated.  Review of Systems: All other 10 point review of systems is negative.   Physical Exam:  height is 5\' 6"  (1.676 m) and weight is 171 lb (77.6 kg). Her oral temperature is 97.8 F (36.6 C). Her blood pressure is 126/72 and her pulse is 86. Her respiration is 18 and oxygen saturation is 99%.   Wt Readings from Last 3 Encounters:  04/19/20 171 lb (77.6 kg)  03/25/20 168 lb 12.8 oz (76.6 kg)  03/21/20 170 lb 5 oz (77.3 kg)    Ocular: Sclerae unicteric, pupils equal, round and reactive to light Ear-nose-throat: Oropharynx clear, dentition fair Lymphatic: No cervical or supraclavicular adenopathy Lungs no rales or rhonchi, good excursion bilaterally Heart regular rate and rhythm, no murmur appreciated Abd soft, nontender, positive bowel sounds, no liver or spleen tip palpated on exam, no fluid wave  MSK no focal spinal tenderness, no joint edema Neuro: non-focal, well-oriented, appropriate affect Breasts: Deferred   Lab Results  Component Value Date   WBC 8.0 04/19/2020   HGB 14.7 04/19/2020  HCT 44.2 04/19/2020   MCV 92.1 04/19/2020   PLT 291 04/19/2020   Lab Results  Component Value Date   FERRITIN 434 (H) 02/13/2020   IRON 176 (H) 02/13/2020   TIBC 273 02/13/2020   UIBC 97 (L) 02/13/2020   IRONPCTSAT 65 (H) 02/13/2020   Lab Results  Component Value Date   RETICCTPCT 1.7 08/28/2019   RBC 4.80 04/19/2020   No results found for: KPAFRELGTCHN, LAMBDASER, KAPLAMBRATIO No results found for: IGGSERUM, IGA, IGMSERUM No results found for: Odetta Pink, SPEI    Chemistry      Component Value Date/Time   NA 139 02/13/2020 1115   NA 140 09/13/2012 0916   K 3.9 02/13/2020 1115   K 3.8 09/13/2012 0916   CL 101 02/13/2020 1115   CL 104 09/13/2012 0916   CO2 30 02/13/2020 1115   CO2 28 09/13/2012 0916   BUN 21 02/13/2020 1115   BUN 16.0 09/13/2012 0916   CREATININE 0.67 02/13/2020 1115   CREATININE 0.7 09/13/2012 0916      Component Value Date/Time   CALCIUM 9.8 02/13/2020 1115   CALCIUM 9.5 09/13/2012 0916   ALKPHOS 89 02/13/2020 1115   ALKPHOS 76 09/13/2012 0916   AST 21 02/13/2020 1115   AST 25 09/13/2012 0916   ALT 13 02/13/2020 1115   ALT 25 09/13/2012 0916   BILITOT 0.7 02/13/2020 1115   BILITOT 0.94 09/13/2012 0916       Impression and Plan: Maria [name] is a very pleasant 84 yo caucasian female with hemochromatosis, heterozygous for the C282Y mutation. We will see what her iron studies look like and get her phlebotomy plan set up next week if needed.  We will go ahead and plan to see her back in another 2 months.  She will contact our office with any questions or concerns. We can certainly see her sooner if needed.  [name], NP 6/18/202111:58 AM

## 2020-04-22 LAB — IRON AND TIBC
Iron: 163 ug/dL — ABNORMAL HIGH (ref 41–142)
Saturation Ratios: 58 % — ABNORMAL HIGH (ref 21–57)
TIBC: 281 ug/dL (ref 236–444)
UIBC: 119 ug/dL — ABNORMAL LOW (ref 120–384)

## 2020-04-22 LAB — FERRITIN: Ferritin: 587 ng/mL — ABNORMAL HIGH (ref 11–307)

## 2020-05-02 ENCOUNTER — Encounter: Payer: Self-pay | Admitting: Gastroenterology

## 2020-05-02 ENCOUNTER — Ambulatory Visit: Payer: Medicare Other | Admitting: Gastroenterology

## 2020-05-02 VITALS — BP 106/74 | HR 70 | Ht 65.5 in | Wt 169.4 lb

## 2020-05-02 DIAGNOSIS — Z800 Family history of malignant neoplasm of digestive organs: Secondary | ICD-10-CM

## 2020-05-02 DIAGNOSIS — R140 Abdominal distension (gaseous): Secondary | ICD-10-CM | POA: Diagnosis not present

## 2020-05-02 DIAGNOSIS — R12 Heartburn: Secondary | ICD-10-CM | POA: Diagnosis not present

## 2020-05-02 DIAGNOSIS — R194 Change in bowel habit: Secondary | ICD-10-CM

## 2020-05-02 DIAGNOSIS — R143 Flatulence: Secondary | ICD-10-CM

## 2020-05-02 DIAGNOSIS — K5730 Diverticulosis of large intestine without perforation or abscess without bleeding: Secondary | ICD-10-CM | POA: Diagnosis not present

## 2020-05-02 NOTE — Patient Instructions (Addendum)
If you are age 82 or older, your body mass index should be between 23-30. Your Body mass index is 27.76 kg/m. If this is out of the aforementioned range listed, please consider follow up with your Primary Care Provider.  If you are age 74 or younger, your body mass index should be between 19-25. Your Body mass index is 27.76 kg/m. If this is out of the aformentioned range listed, please consider follow up with your Primary Care Provider.   You have been scheduled for a colonoscopy. Please follow written instructions given to you at your visit today.  Please pick up your prep supplies at the pharmacy within the next 1-3 days. If you use inhalers (even only as needed), please bring them with you on the day of your procedure.  Stop fiber  Begin 1/2 capful to 1 capful of Miralax, if stools are to loose with Miralax, change to Ducosate 100 mg twice a day  It was a pleasure to see you today!  Dr. Loletha Carrow

## 2020-05-02 NOTE — Progress Notes (Signed)
[facility] Consult Note:  History: Maria [name] 05/02/2020  Referring provider: Mosie Lukes, MD  Reason for consult/chief complaint: Constipation (feels like she has to have a bowel movement often/ Bowel movements are small ) and Gas (a lot of belching and bloating )   Subjective  HPI: Family history of colon cancer in mother (patient reports it was father).  Former patient of Dr. Olevia Perches, routine colonoscopies in 2005, 2008 and most recently July 2014.  On that exam, no polyps seen, but significant left-sided diverticulosis with haustral thickening and tortuosity.  This is a very pleasant 84 year old woman referred for change in bowel habits and dyspepsia.  She does take fiber regularly as recommended by Dr. Olevia Perches.  In about the last 2 years she has noticed a change in bowel habits, less frequent, and usually passage of small pellets.  Sometimes she will feel pressure with the need for BM but nothing further.  She has bloating and flatulence.  Denies rectal bleeding.  Her appetite is good and weight stable.Rumi has also had intermittent pyrosis without feelings of regurgitation, and denies dysphagia.  It typically responds to as needed Tums or an OTC tablet (?  Pepcid or Prilosec).    ROS:  Review of Systems  Constitutional: Negative for appetite change and unexpected weight change.  HENT: Negative for mouth sores and voice change.   Eyes: Negative for pain and redness.  Respiratory: Negative for cough and shortness of breath.   Cardiovascular: Negative for chest pain and palpitations.  Genitourinary: Negative for dysuria and hematuria.  Musculoskeletal: Negative for arthralgias and myalgias.  Skin: Negative for pallor and rash.  Neurological: Negative for weakness and headaches.  Hematological: Negative for adenopathy.     Past Medical History: Past Medical History:  Diagnosis Date  . Complication of anesthesia    slow to awaken  .  Depression   . Diverticulosis   . Goiter, nodular    [place] of thyroid stable 7/08  . Headache    hx of  . Heart murmur    due to rheumatic fever as child  . Hemochromatosis, hereditary ([place]) 09/01/2019  . Mitral valve prolapse   . Multiple thyroid nodules 11/24/2015  . Neoplasm of lung, malignant ([place]) 2002   Non-small cell surgical tx  . Osteoarthritis 11/14/2013   2010 intra-articular steroids X 3 S/P Physical Therapy in 12/14 TKR 12/11/13   . Osteopenia    Dr Pamala Hurry  . PONV (postoperative nausea and vomiting)    n/v  . Sleep apnea   . 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 ABDOMINAL HYSTERECTOMY  1998   No BSO, for dysfunctional menses  . BLADDER SUSPENSION Bilateral   . BREAST BIOPSY Right   . COLONOSCOPY  2009 , 2014   Diverticulosis; Dr. Olevia Perches  . LOBECTOMY  12/02   RU; no radiation or chemo, Dr. Arlyce Dice  . TONSILLECTOMY    . TOTAL KNEE ARTHROPLASTY Right 12/11/2013   Procedure: RIGHT TOTAL KNEE ARTHROPLASTY;  Surgeon: Gearlean Alf, MD;  Location: [facility];  Service: Orthopedics;  Laterality: Right;  . TOTAL KNEE ARTHROPLASTY Left 06/28/2017   Procedure: LEFT TOTAL KNEE ARTHROPLASTY;  Surgeon: Gaynelle Arabian, MD;  Location: [facility];  Service: Orthopedics;  Laterality: Left;  Adductor Block     Family History: Family History  Problem Relation Age of Onset  .  COPD Mother   . Coronary artery disease Father   . Colon cancer Father 50  . Heart attack Father 31  . Stroke Father        > 30  . Lung cancer Paternal Aunt        smoker  . Cancer Paternal Aunt        lung  . Lung cancer Maternal Grandfather        smoker  . COPD Maternal Grandfather   . Arthritis Sister   . Lung cancer Maternal Uncle        smoker  . Diabetes Neg Hx     Social History: Social History   Socioeconomic History  . Marital status: Divorced    Spouse name: Not  on file  . Number of children: Not on file  . Years of education: college  . Highest education level: Not on file  Occupational History  . Occupation: Retired    Fish farm manager: RETIRED  Tobacco Use  . Smoking status: Former Smoker    Years: 20.00    Types: Cigarettes    Quit date: 05/03/1979    Years since quitting: 41.0  . Smokeless tobacco: Never Used  . Tobacco comment: smoked 1962-1980, up to < 1 ppd  Vaping Use  . Vaping Use: Never used  Substance and Sexual Activity  . Alcohol use: Yes    Alcohol/week: 7.0 standard drinks    Types: 7 Standard drinks or equivalent per week    Comment: occasional  . Drug use: No  . Sexual activity: Never    Comment: lives alone, dog, no dietary restrictions, eating heart healthy  Other Topics Concern  . Not on file  Social History Narrative   Pt gets regular exercise.   Drinks 2 cups of coffee a day    Social Determinants of Health   Financial Resource Strain: Low Risk   . Difficulty of Paying Living Expenses: Not hard at all  Food Insecurity: No Food Insecurity  . Worried About Charity fundraiser in the Last Year: Never true  . Ran Out of Food in the Last Year: Never true  Transportation Needs: No Transportation Needs  . Lack of Transportation (Medical): No  . Lack of Transportation (Non-Medical): No  Physical Activity:   . Days of Exercise per Week:   . Minutes of Exercise per Session:   Stress:   . Feeling of Stress :   Social Connections:   . Frequency of Communication with Friends and Family:   . Frequency of Social Gatherings with Friends and Family:   . Attends Religious Services:   . Active Member of Clubs or Organizations:   . Attends Archivist Meetings:   Marland Kitchen Marital Status:    Stays very active Lived here many years, then moved to [facility], then back here within the last several years.  Allergies: No Known Allergies  Outpatient Meds: Current Outpatient Medications  Medication Sig Dispense Refill  .  acetaminophen (TYLENOL) 500 MG tablet Take 500 mg by mouth every 6 (six) hours as needed.    Marland Kitchen amLODipine (NORVASC) 5 MG tablet TAKE 1.5 TABLETS (7.5 MG TOTAL) BY MOUTH DAILY. 135 tablet 1  . DULoxetine (CYMBALTA) 30 MG capsule TAKE 1 CAPSULE BY MOUTH EVERY DAY 90 capsule 1  . gabapentin (NEURONTIN) 100 MG capsule Take 2 capsules (200 mg total) by mouth at bedtime. 180 capsule 0  . ibuprofen (ADVIL) 200 MG tablet Take 200 mg by mouth every 6 (six) hours  as needed for headache.    . traMADol (ULTRAM) 50 MG tablet Take 1 tablet (50 mg total) by mouth 2 (two) times daily as needed. 30 tablet 0   No current facility-administered medications for this visit.      ___________________________________________________________________ Objective   Exam:  BP 106/74   Pulse 70   Ht 5' 5.5" (1.664 m)   Wt 169 lb 6 oz (76.8 kg)   BMI 27.76 kg/m    General: Well-appearing  Eyes: sclera anicteric, no redness  ENT: oral mucosa moist without lesions, no cervical or supraclavicular lymphadenopathy  CV: RRR without murmur, S1/S2, no JVD, no peripheral edema  Resp: clear to auscultation bilaterally, normal RR and effort noted  GI: soft, no tenderness, with active bowel sounds. No guarding or palpable organomegaly noted.  Skin; warm and dry, no rash or jaundice noted  Neuro: awake, alert and oriented x 3. Normal gross motor function and fluent speech  Labs:  CBC Latest Ref Rng & Units 04/19/2020 02/13/2020 01/05/2020  WBC 4.0 - 10.5 K/uL 8.0 7.5 6.8  Hemoglobin 12.0 - 15.0 g/dL 14.7 14.9 14.1  Hematocrit 36 - 46 % 44.2 44.5 41.9  Platelets 150 - 400 K/uL 291 276 260   Last colonoscopy report reviewed as noted above  Assessment: Encounter Diagnoses  Name Primary?  . Change in bowel habits Yes  . Abdominal bloating   . Heartburn   . Diverticulosis of colon   . Flatulence     Change in bowel habits most likely related to progression of diverticulosis with associated luminal narrowing  and tortuosity.  Less likely neoplasm, though family history should be considered. I think the flatulence is an associated symptom from the constipation.  Plan:  Stop daily fiber supplement for next 2 weeks, as it may be adding bulk to stool in the setting of the anatomic tortuosity. 1/2-1 full capful of MiraLAX a day.  If that makes stool too loose, start Dukas 8 stool softener 100 mg twice daily.  Colonoscopy.  She is agreeable after discussion of procedure and risks.  The benefits and risks of the planned procedure were described in detail with the patient or (when appropriate) their health care proxy.  Risks were outlined as including, but not limited to, bleeding, infection, perforation, adverse medication reaction leading to cardiac or pulmonary decompensation, pancreatitis (if ERCP).  The limitation of incomplete mucosal visualization was also discussed.  No guarantees or warranties were given.   Thank you for the courtesy of this consult.  Please call me with any questions or concerns.  Nelida Meuse III  CC: Referring provider noted above

## 2020-05-03 ENCOUNTER — Encounter: Payer: Self-pay | Admitting: Gastroenterology

## 2020-05-07 ENCOUNTER — Encounter: Payer: Medicare Other | Admitting: Gastroenterology

## 2020-05-08 ENCOUNTER — Telehealth: Payer: Self-pay | Admitting: Gastroenterology

## 2020-05-08 MED ORDER — PLENVU 140 G PO SOLR
140.0000 g | ORAL | 0 refills | Status: DC
Start: 2020-05-08 — End: 2020-06-20

## 2020-05-08 NOTE — Telephone Encounter (Signed)
Pt states her pharmacy never received the prep for COLONOSCOPY WITH PLENVU.   [facility]

## 2020-05-08 NOTE — Telephone Encounter (Signed)
Rx has been Re-sent as requested

## 2020-05-09 ENCOUNTER — Inpatient Hospital Stay: Payer: Medicare Other | Attending: Hematology & Oncology

## 2020-05-09 ENCOUNTER — Other Ambulatory Visit: Payer: Self-pay

## 2020-05-09 NOTE — Patient Instructions (Signed)

## 2020-05-13 ENCOUNTER — Ambulatory Visit: Payer: Medicare Other | Admitting: Family Medicine

## 2020-05-13 ENCOUNTER — Other Ambulatory Visit: Payer: Self-pay

## 2020-05-13 ENCOUNTER — Encounter: Payer: Self-pay | Admitting: Family Medicine

## 2020-05-13 VITALS — BP 124/70 | HR 91 | Ht 65.5 in | Wt 169.0 lb

## 2020-05-13 DIAGNOSIS — M545 Low back pain, unspecified: Secondary | ICD-10-CM

## 2020-05-13 DIAGNOSIS — G8929 Other chronic pain: Secondary | ICD-10-CM

## 2020-05-13 DIAGNOSIS — M47896 Other spondylosis, lumbar region: Secondary | ICD-10-CM

## 2020-05-13 NOTE — Patient Instructions (Addendum)
Good to see you See me 3-4 weeks after epidural 1 pill gabapentin nightly for a week then stop

## 2020-05-13 NOTE — Progress Notes (Signed)
[facility] [address] [place] Phone: [phone] Subjective:   I Maria [name] am serving as a Education administrator for Dr. Hulan Saas.  This visit occurred during the SARS-CoV-2 public health emergency.  Safety protocols were in place, including screening questions prior to the visit, additional usage of staff PPE, and extensive cleaning of exam room while observing appropriate contact time as indicated for disinfecting solutions.   I'm seeing this patient by the request  of:  Mosie Lukes, MD  CC: Low back pain follow-up  [id number]   02/15/2020 Patient is doing better at this moment.  Continue the gabapentin and the Cymbalta.  Do have room to increase Cymbalta if necessary but patient is having some mild morning somnolence secondary to the gabapentin.  Patient will continue the home exercises and follow-up again in 2 to 3 months time for further evaluation and treatment  Update 05/13/2020 Maria [name] is a 84 y.o. female coming in with complaint of low back pain. Patient states she is not doing well. States she would like an injection. Bilateral ankle swelling.  Patient would like to be off the gabapentin if possible.   Patient is back pain was not improving and was sent for an MRI. MRI was not done actually in October 2020. Did show that patient did have diffuse lumbar spondylosis at multiple levels of lumbar spine with questionable nerve root impingements.  Past Medical History:  Diagnosis Date  . Complication of anesthesia    slow to awaken  . Depression   . Diverticulosis   . Goiter, nodular    [place] of thyroid stable 7/08  . Headache    hx of  . Heart murmur    due to rheumatic fever as child  . Hemochromatosis, hereditary ([place]) 09/01/2019  . Mitral valve prolapse   . Multiple thyroid nodules 11/24/2015  . Neoplasm of lung, malignant ([place]) 2002   Non-small cell surgical tx  . Osteoarthritis 11/14/2013   2010  intra-articular steroids X 3 S/P Physical Therapy in 12/14 TKR 12/11/13   . Osteopenia    Dr Pamala Hurry  . PONV (postoperative nausea and vomiting)    n/v  . Sleep apnea   . Vitamin D deficiency    Past Surgical History:  Procedure Laterality Date  . ABDOMINAL HYSTERECTOMY  1998   No BSO, for dysfunctional menses  . BLADDER SUSPENSION Bilateral   . BREAST BIOPSY Right   . COLONOSCOPY  2009 , 2014   Diverticulosis; Dr. Olevia Perches  . LOBECTOMY  12/02   RU; no radiation or chemo, Dr. Arlyce Dice  . TONSILLECTOMY    . TOTAL KNEE ARTHROPLASTY Right 12/11/2013   Procedure: RIGHT TOTAL KNEE ARTHROPLASTY;  Surgeon: Gearlean Alf, MD;  Location: [facility];  Service: Orthopedics;  Laterality: Right;  . TOTAL KNEE ARTHROPLASTY Left 06/28/2017   Procedure: LEFT TOTAL KNEE ARTHROPLASTY;  Surgeon: Gaynelle Arabian, MD;  Location: [facility];  Service: Orthopedics;  Laterality: Left;  Adductor Block   Social History   Socioeconomic History  . Marital status: Divorced    Spouse name: Not on file  . Number of children: Not on file  . Years of education: college  . Highest education level: Not on file  Occupational History  . Occupation: Retired    Fish farm manager: RETIRED  Tobacco Use  . Smoking status: Former Smoker    Years: 20.00    Types: Cigarettes    Quit date: 05/03/1979    Years since quitting:  41.0  . Smokeless tobacco: Never Used  . Tobacco comment: smoked 1962-1980, up to < 1 ppd  Vaping Use  . Vaping Use: Never used  Substance and Sexual Activity  . Alcohol use: Yes    Alcohol/week: 7.0 standard drinks    Types: 7 Standard drinks or equivalent per week    Comment: occasional  . Drug use: No  . Sexual activity: Never    Comment: lives alone, dog, no dietary restrictions, eating heart healthy  Other Topics Concern  . Not on file  Social History Narrative   Pt gets regular exercise.   Drinks 2 cups of coffee a day    Social Determinants of Health   Financial Resource Strain: Low Risk   .  Difficulty of Paying Living Expenses: Not hard at all  Food Insecurity: No Food Insecurity  . Worried About Charity fundraiser in the Last Year: Never true  . Ran Out of Food in the Last Year: Never true  Transportation Needs: No Transportation Needs  . Lack of Transportation (Medical): No  . Lack of Transportation (Non-Medical): No  Physical Activity:   . Days of Exercise per Week:   . Minutes of Exercise per Session:   Stress:   . Feeling of Stress :   Social Connections:   . Frequency of Communication with Friends and Family:   . Frequency of Social Gatherings with Friends and Family:   . Attends Religious Services:   . Active Member of Clubs or Organizations:   . Attends Archivist Meetings:   Marland Kitchen Marital Status:    No Known Allergies Family History  Problem Relation Age of Onset  . COPD Mother   . Coronary artery disease Father   . Colon cancer Father 44  . Heart attack Father 45  . Stroke Father        > 20  . Lung cancer Paternal Aunt        smoker  . Cancer Paternal Aunt        lung  . Lung cancer Maternal Grandfather        smoker  . COPD Maternal Grandfather   . Arthritis Sister   . Lung cancer Maternal Uncle        smoker  . Diabetes Neg Hx      Current Outpatient Medications (Cardiovascular):  .  amLODipine (NORVASC) 5 MG tablet, TAKE 1.5 TABLETS (7.5 MG TOTAL) BY MOUTH DAILY.   Current Outpatient Medications (Analgesics):  .  acetaminophen (TYLENOL) 500 MG tablet, Take 500 mg by mouth every 6 (six) hours as needed. Marland Kitchen  ibuprofen (ADVIL) 200 MG tablet, Take 200 mg by mouth every 6 (six) hours as needed for headache. .  traMADol (ULTRAM) 50 MG tablet, Take 1 tablet (50 mg total) by mouth 2 (two) times daily as needed.   Current Outpatient Medications (Other):  Marland Kitchen  DULoxetine (CYMBALTA) 30 MG capsule, TAKE 1 CAPSULE BY MOUTH EVERY DAY .  gabapentin (NEURONTIN) 100 MG capsule, Take 2 capsules (200 mg total) by mouth at bedtime. Marland Kitchen   PEG-KCl-NaCl-NaSulf-Na Asc-C (PLENVU) 140 g SOLR, Take 140 g by mouth as directed.   Reviewed prior external information including notes and imaging from  primary care provider As well as notes that were available from care everywhere and other healthcare systems.  Past medical history, social, surgical and family history all reviewed in electronic medical record.  No pertanent information unless stated regarding to the chief complaint.   Review of Systems:  No headache, visual changes, nausea, vomiting, diarrhea, constipation, dizziness, abdominal pain, skin rash, fevers, chills, night sweats, weight loss, swollen lymph nodes, body aches, joint swelling, chest pain, shortness of breath, mood changes. POSITIVE muscle aches  Objective  Blood pressure 124/70, pulse 91, height 5' 5.5" (1.664 m), weight 169 lb (76.7 kg), SpO2 96 %.   General: No apparent distress alert and oriented x3 mood and affect normal, dressed appropriately.  HEENT: Pupils equal, extraocular movements intact  Respiratory: Patient's speak in full sentences and does not appear short of breath  Cardiovascular: Trace effusion of the ankles noted bilaterally. Continues to be more of a dependent edema.  Low back exam does show some degenerative scoliosis noted with loss of lordosis. Patient has tightness with FABER test bilaterally. Tightness with straight leg test but no true radicular symptoms at the moment. Worsening pain with extension noted. Neurovascularly intact distally.     Impression and Recommendations:     The above documentation has been reviewed and is accurate and complete Lyndal Pulley, DO       Note: This dictation was prepared with Dragon dictation along with smaller phrase technology. Any transcriptional errors that result from this process are unintentional.

## 2020-05-13 NOTE — Assessment & Plan Note (Signed)
Chronic problem with exacerbation.  Not responding as much to the gabapentin and the Cymbalta.  I do believe an epidural was next chance of helping patient with improvement.  Patient would avoid any type of surgical intervention.  I do believe the patient will do well with this.  Patient did have the epidural ordered today.  We discussed other things such as repeating formal physical therapy or other medications were patient declined.  Total time reviewing patient's chart today as well as discussing with patient's 32 minutes.

## 2020-05-16 ENCOUNTER — Ambulatory Visit
Admission: RE | Admit: 2020-05-16 | Discharge: 2020-05-16 | Disposition: A | Discharge status: Home / Self Care | Payer: Medicare Other | Source: Ambulatory Visit | Attending: Family Medicine | Admitting: Family Medicine

## 2020-05-16 ENCOUNTER — Ambulatory Visit: Payer: Medicare Other | Admitting: Family Medicine

## 2020-05-16 ENCOUNTER — Other Ambulatory Visit: Payer: Self-pay

## 2020-05-16 DIAGNOSIS — M545 Low back pain, unspecified: Secondary | ICD-10-CM

## 2020-05-16 DIAGNOSIS — G8929 Other chronic pain: Secondary | ICD-10-CM

## 2020-05-16 MED ORDER — METHYLPREDNISOLONE ACETATE 40 MG/ML INJ SUSP (RADIOLOG
120.0000 mg | Freq: Once | INTRAMUSCULAR | Status: AC
  Administered 2020-05-16: 120 mg via EPIDURAL

## 2020-05-16 MED ORDER — IOPAMIDOL (ISOVUE-M 200) INJECTION 41%
1.0000 mL | Freq: Once | INTRAMUSCULAR | Status: AC
  Administered 2020-05-16: 1 mL via EPIDURAL

## 2020-05-16 NOTE — Discharge Instructions (Signed)

## 2020-05-22 ENCOUNTER — Other Ambulatory Visit: Payer: Self-pay

## 2020-05-22 ENCOUNTER — Inpatient Hospital Stay: Payer: Medicare Other

## 2020-05-22 NOTE — Progress Notes (Signed)
Maria [name] presents today for phlebotomy per MD orders. Phlebotomy procedure completed by Dixie Smith,RN  Started at 1420 and ended at 1432. 515 grams removed via 18 gauge needle to right AC.  Patient observed for 30 minutes after procedure without any incident. Patient tolerated procedure well. Patient understands to call if he has any questions or concerns post discharge.

## 2020-05-22 NOTE — Patient Instructions (Signed)

## 2020-06-02 ENCOUNTER — Encounter: Payer: Self-pay | Admitting: Certified Registered Nurse Anesthetist

## 2020-06-03 ENCOUNTER — Encounter: Payer: Self-pay | Admitting: Gastroenterology

## 2020-06-03 ENCOUNTER — Ambulatory Visit (AMBULATORY_SURGERY_CENTER): Payer: Medicare Other | Admitting: Gastroenterology

## 2020-06-03 ENCOUNTER — Other Ambulatory Visit: Payer: Self-pay

## 2020-06-03 VITALS — BP 125/77 | HR 81 | Temp 97.7°F | Resp 24 | Ht 65.0 in | Wt 169.0 lb

## 2020-06-03 DIAGNOSIS — R194 Change in bowel habit: Secondary | ICD-10-CM | POA: Diagnosis not present

## 2020-06-03 DIAGNOSIS — K5730 Diverticulosis of large intestine without perforation or abscess without bleeding: Secondary | ICD-10-CM

## 2020-06-03 MED ORDER — SODIUM CHLORIDE 0.9 % IV SOLN: 500.0000 mL | INTRAVENOUS | Status: DC

## 2020-06-03 NOTE — Op Note (Signed)
[facility] Patient Name: Maria [name] Procedure Date: 06/03/2020 1:27 PM MRN: [id number] Endoscopist: Mallie Mussel L. Loletha Carrow , MD Age: 84 Referring MD:  Date of Birth: 04-13-1936 Gender: Female Account #: [id number] Procedure:                Colonoscopy Indications:              Change in bowel habits Medicines:                Monitored Anesthesia Care Procedure:                Pre-Anesthesia Assessment:                           - Prior to the procedure, a History and Physical                            was performed, and patient medications and                            allergies were reviewed. The patient's tolerance of                            previous anesthesia was also reviewed. The risks                            and benefits of the procedure and the sedation                            options and risks were discussed with the patient.                            All questions were answered, and informed consent                            was obtained. Prior Anticoagulants: The patient has                            taken no previous anticoagulant or antiplatelet                            agents. ASA Grade Assessment: III - A patient with                            severe systemic disease. After reviewing the risks                            and benefits, the patient was deemed in                            satisfactory condition to undergo the procedure.                           After obtaining informed consent, the colonoscope  was passed under direct vision. Throughout the                            procedure, the patient's blood pressure, pulse, and                            oxygen saturations were monitored continuously. The                            Colonoscope was introduced through the anus and                            advanced to the the cecum, identified by                            appendiceal orifice and ileocecal valve.  The                            colonoscopy was performed without difficulty. The                            patient tolerated the procedure well. The quality                            of the bowel preparation was fair. The ileocecal                            valve, appendiceal orifice, and rectum were                            photographed. The bowel preparation used was Plenvu. Scope In: 1:47:08 PM Scope Out: 2:04:08 PM Scope Withdrawal Time: 0 hours 10 minutes 57 seconds  Total Procedure Duration: 0 hours 17 minutes 0 seconds  Findings:                 The perianal and digital rectal examinations were                            normal.                           Many small and large-mouthed diverticula were found                            in the left colon. There was associated tortuosity.                           The exam was otherwise without abnormality on                            direct and retroflexion views. Complications:            No immediate complications. Estimated Blood Loss:     Estimated blood loss: none. Impression:               - Preparation  of the colon was fair.                           - Diverticulosis in the left colon.                           - The examination was otherwise normal on direct                            and retroflexion views.                           - No specimens collected. Recommendation:           - Patient has a contact number available for                            emergencies. The signs and symptoms of potential                            delayed complications were discussed with the                            patient. Return to normal activities tomorrow.                            Written discharge instructions were provided to the                            patient.                           - Resume previous diet.                           - Continue present medications.                           - Based on current  guidelines, no repeat routine                            screening colonoscopy recommended. Jermy Couper L. Loletha Carrow, MD 06/03/2020 2:10:48 PM This report has been signed electronically.

## 2020-06-03 NOTE — Progress Notes (Signed)
Report given to PACU, vss 

## 2020-06-03 NOTE — Progress Notes (Signed)
Vs SP

## 2020-06-03 NOTE — Patient Instructions (Signed)
Handout on diverticulosis given.   YOU HAD AN ENDOSCOPIC PROCEDURE TODAY AT THE [facility]:   Refer to the procedure report that was given to you for any specific questions about what was found during the examination.  If the procedure report does not answer your questions, please call your gastroenterologist to clarify.  If you requested that your care partner not be given the details of your procedure findings, then the procedure report has been included in a sealed envelope for you to review at your convenience later.  YOU SHOULD EXPECT: Some feelings of bloating in the abdomen. Passage of more gas than usual.  Walking can help get rid of the air that was put into your GI tract during the procedure and reduce the bloating. If you had a lower endoscopy (such as a colonoscopy or flexible sigmoidoscopy) you may notice spotting of blood in your stool or on the toilet paper. If you underwent a bowel prep for your procedure, you may not have a normal bowel movement for a few days.  Please Note:  You might notice some irritation and congestion in your nose or some drainage.  This is from the oxygen used during your procedure.  There is no need for concern and it should clear up in a day or so.  SYMPTOMS TO REPORT IMMEDIATELY:   Following lower endoscopy (colonoscopy or flexible sigmoidoscopy):  Excessive amounts of blood in the stool  Significant tenderness or worsening of abdominal pains  Swelling of the abdomen that is new, acute  Fever of 100F or higher   For urgent or emergent issues, a gastroenterologist can be reached at any hour by calling [phone]. Do not use MyChart messaging for urgent concerns.    DIET:  We do recommend a small meal at first, but then you may proceed to your regular diet.  Drink plenty of fluids but you should avoid alcoholic beverages for 24 hours.  ACTIVITY:  You should plan to take it easy for the rest of today and you should NOT DRIVE or use  heavy machinery until tomorrow (because of the sedation medicines used during the test).    FOLLOW UP: Our staff will call the number listed on your records 48-72 hours following your procedure to check on you and address any questions or concerns that you may have regarding the information given to you following your procedure. If we do not reach you, we will leave a message.  We will attempt to reach you two times.  During this call, we will ask if you have developed any symptoms of COVID 19. If you develop any symptoms (ie: fever, flu-like symptoms, shortness of breath, cough etc.) before then, please call [phone].  If you test positive for Covid 19 in the 2 weeks post procedure, please call and report this information to us.    If any biopsies were taken you will be contacted by phone or by letter within the next 1-3 weeks.  Please call us at [phone] if you have not heard about the biopsies in 3 weeks.    SIGNATURES/CONFIDENTIALITY: You and/or your care partner have signed paperwork which will be entered into your electronic medical record.  These signatures attest to the fact that that the information above on your After Visit Summary has been reviewed and is understood.  Full responsibility of the confidentiality of this discharge information lies with you and/or your care-partner. 

## 2020-06-05 ENCOUNTER — Telehealth: Payer: Self-pay

## 2020-06-05 NOTE — Telephone Encounter (Signed)
°  Follow up Call-  Call back number 06/03/2020  Post procedure Call Back phone  # [phone]  Permission to leave phone message Yes  Some recent data might be hidden     Patient questions:  Do you have a fever, pain , or abdominal swelling? No. Pain Score  0 *  Have you tolerated food without any problems? Yes.    Have you been able to return to your normal activities? Yes.    Do you have any questions about your discharge instructions: Diet   No. Medications  No. Follow up visit  No.  Do you have questions or concerns about your Care? No.  Actions: * If pain score is 4 or above: 1. No action needed, pain <4.Have you developed a fever since your procedure? no  2.   Have you had an respiratory symptoms (SOB or cough) since your procedure? no  3.   Have you tested positive for COVID 19 since your procedure no  4.   Have you had any family members/close contacts diagnosed with the COVID 19 since your procedure?  no   If yes to any of these questions please route to Joylene John, RN and Erenest Rasher, RN

## 2020-06-06 ENCOUNTER — Inpatient Hospital Stay: Payer: Medicare Other | Attending: Hematology & Oncology

## 2020-06-06 ENCOUNTER — Other Ambulatory Visit: Payer: Self-pay

## 2020-06-06 NOTE — Patient Instructions (Signed)

## 2020-06-10 ENCOUNTER — Ambulatory Visit: Payer: Medicare Other | Admitting: Family Medicine

## 2020-06-10 ENCOUNTER — Encounter: Payer: Self-pay | Admitting: Family Medicine

## 2020-06-10 ENCOUNTER — Other Ambulatory Visit: Payer: Self-pay

## 2020-06-10 VITALS — BP 122/82 | HR 84 | Ht 65.0 in | Wt 167.0 lb

## 2020-06-10 DIAGNOSIS — M19079 Primary osteoarthritis, unspecified ankle and foot: Secondary | ICD-10-CM | POA: Diagnosis not present

## 2020-06-10 DIAGNOSIS — M79671 Pain in right foot: Secondary | ICD-10-CM | POA: Diagnosis not present

## 2020-06-10 DIAGNOSIS — M47896 Other spondylosis, lumbar region: Secondary | ICD-10-CM | POA: Diagnosis not present

## 2020-06-10 NOTE — Assessment & Plan Note (Signed)
Midfoot arthritis.  Discussed with patient about rocker-bottom shoes, topical anti-inflammatories, worsening symptoms will consider injection.  Patient will continue to stay active.  We discussed different medication changes such as gabapentin or the Cymbalta which patient declined increasing at this time but will follow up again in 6 to 8 weeks

## 2020-06-10 NOTE — Assessment & Plan Note (Signed)
Severe overall.  We discussed with patient improvement.  Patient wants to continue with conservative therapy and did have some slight improvement with the epidural.  Patient is still able to walk her dog for approximately 1 mile a day.  He is having some foot pain and we discussed that as well.  Patient will increase activity slowly.  Follow-up again in 4 to 8 weeks patient will be going to [place] for 1 month and we will make sure patient is feeling relatively better by then

## 2020-06-10 NOTE — Progress Notes (Signed)
[facility] [place] Phone: [phone] Subjective:   Maria [name], am serving as a scribe for Dr. Hulan Saas. This visit occurred during the SARS-CoV-2 public health emergency.  Safety protocols were in place, including screening questions prior to the visit, additional usage of staff PPE, and extensive cleaning of exam room while observing appropriate contact time as indicated for disinfecting solutions.   I'm seeing this patient by the request  of:  Mosie Lukes, MD  CC: low back pain follow up   [id number]   05/13/2020 Chronic problem with exacerbation.  Not responding as much to the gabapentin and the Cymbalta.  I do believe an epidural was next chance of helping patient with improvement.  Patient would avoid any type of surgical intervention.  I do believe the patient will do well with this.  Patient did have the epidural ordered today.  We discussed other things such as repeating formal physical therapy or other medications were patient declined.  Total time reviewing patient's chart today as well as discussing with patient's 32 minutes.  Update 06/10/2020 Maria [name] is a 84 y.o. female coming in with complaint of low back pain. Epidural on 7/15/52021. States that epidural did not help with her pain. Continues to walk. Does not take gabapentin but is using Cymbalta.  Right foot pain over dorsal aspect of tarsal bones. Feels like pain is worsening. Had injection in top of foot 10 years. Pain worse with walking.     MRI showed severe DDD of lumbar- Epidural prefomed 05/16/2020  Meds on gabapentin and cymbalta 30 mg tramadol from PCP for breakthrough pain   Past Medical History:  Diagnosis Date  . Cataract   . Complication of anesthesia    slow to awaken  . Depression   . Diverticulosis   . Goiter, nodular    [place] of thyroid stable 7/08  . Headache    hx of  . Heart murmur    due to rheumatic fever  as child  . Hemochromatosis, hereditary ([place]) 09/01/2019  . Mitral valve prolapse   . Multiple thyroid nodules 11/24/2015  . Neoplasm of lung, malignant ([place]) 2002   Non-small cell surgical tx  . Osteoarthritis 11/14/2013   2010 intra-articular steroids X 3 S/P Physical Therapy in 12/14 TKR 12/11/13   . Osteopenia    Dr Pamala Hurry  . PONV (postoperative nausea and vomiting)    n/v  . Sleep apnea   . Vitamin D deficiency    Past Surgical History:  Procedure Laterality Date  . ABDOMINAL HYSTERECTOMY  1998   [name] BSO, for dysfunctional menses  . BLADDER SUSPENSION Bilateral   . BREAST BIOPSY Right   . COLONOSCOPY  2009 , 2014   Diverticulosis; Dr. Olevia Perches  . LOBECTOMY  12/02   RU; [name] radiation or chemo, Dr. Arlyce Dice  . TONSILLECTOMY    . TOTAL KNEE ARTHROPLASTY Right 12/11/2013   Procedure: RIGHT TOTAL KNEE ARTHROPLASTY;  Surgeon: Gearlean Alf, MD;  Location: [facility];  Service: Orthopedics;  Laterality: Right;  . TOTAL KNEE ARTHROPLASTY Left 06/28/2017   Procedure: LEFT TOTAL KNEE ARTHROPLASTY;  Surgeon: Gaynelle Arabian, MD;  Location: [facility];  Service: Orthopedics;  Laterality: Left;  Adductor Block   Social History   Socioeconomic History  . Marital status: Divorced    Spouse name: Not on file  . Number of children: Not on file  . Years of education: college  . Highest education level:  Not on file  Occupational History  . Occupation: Retired    Fish farm manager: RETIRED  Tobacco Use  . Smoking status: Former Smoker    Years: 20.00    Types: Cigarettes    Quit date: 05/03/1979    Years since quitting: 41.1  . Smokeless tobacco: Never Used  . Tobacco comment: smoked 1962-1980, up to < 1 ppd  Vaping Use  . Vaping Use: Never used  Substance and Sexual Activity  . Alcohol use: Yes    Alcohol/week: 7.0 standard drinks    Types: 7 Standard drinks or equivalent per week    Comment: occasional  . Drug use: [name]  . Sexual activity: Never    Comment: lives alone, dog, [name] dietary restrictions,  eating heart healthy  Other Topics Concern  . Not on file  Social History Narrative   Pt gets regular exercise.   Drinks 2 cups of coffee a day    Social Determinants of Health   Financial Resource Strain: Low Risk   . Difficulty of Paying Living Expenses: Not hard at all  Food Insecurity: [name] Food Insecurity  . Worried About Charity fundraiser in the Last Year: Never true  . Ran Out of Food in the Last Year: Never true  Transportation Needs: [name] Transportation Needs  . Lack of Transportation (Medical): [name]  . Lack of Transportation (Non-Medical): [name]  Physical Activity:   . Days of Exercise per Week:   . Minutes of Exercise per Session:   Stress:   . Feeling of Stress :   Social Connections:   . Frequency of Communication with Friends and Family:   . Frequency of Social Gatherings with Friends and Family:   . Attends Religious Services:   . Active Member of Clubs or Organizations:   . Attends Archivist Meetings:   Marland Kitchen Marital Status:    [name] Known Allergies Family History  Problem Relation Age of Onset  . COPD Mother   . Coronary artery disease Father   . Colon cancer Father 33  . Heart attack Father 21  . Stroke Father        > 87  . Lung cancer Paternal Aunt        smoker  . Cancer Paternal Aunt        lung  . Lung cancer Maternal Grandfather        smoker  . COPD Maternal Grandfather   . Arthritis Sister   . Lung cancer Maternal Uncle        smoker  . Diabetes Neg Hx      Current Outpatient Medications (Cardiovascular):  .  amLODipine (NORVASC) 5 MG tablet, TAKE 1.5 TABLETS (7.5 MG TOTAL) BY MOUTH DAILY.   Current Outpatient Medications (Analgesics):  .  acetaminophen (TYLENOL) 500 MG tablet, Take 500 mg by mouth every 6 (six) hours as needed. Marland Kitchen  ibuprofen (ADVIL) 200 MG tablet, Take 200 mg by mouth every 6 (six) hours as needed for headache. .  traMADol (ULTRAM) 50 MG tablet, Take 1 tablet (50 mg total) by mouth 2 (two) times daily as  needed.   Current Outpatient Medications (Other):  Marland Kitchen  DULoxetine (CYMBALTA) 30 MG capsule, TAKE 1 CAPSULE BY MOUTH EVERY DAY .  gabapentin (NEURONTIN) 100 MG capsule, Take 2 capsules (200 mg total) by mouth at bedtime. Marland Kitchen  PEG-KCl-NaCl-NaSulf-Na Asc-C (PLENVU) 140 g SOLR, Take 140 g by mouth as directed.   Reviewed prior external information including notes and imaging from  primary care  provider As well as notes that were available from care everywhere and other healthcare systems.  Past medical history, social, surgical and family history all reviewed in electronic medical record.  [name] pertanent information unless stated regarding to the chief complaint.   Review of Systems:  [name] headache, visual changes, nausea, vomiting, diarrhea, constipation, dizziness, abdominal pain, skin rash, fevers, chills, night sweats, weight loss, swollen lymph nodes, , joint swelling, chest pain, shortness of breath, mood changes. POSITIVE muscle aches, body aches  Objective  Blood pressure 122/82, pulse 84, height 5\' 5"  (1.651 m), weight 167 lb (75.8 kg), SpO2 97 %.   General: [name] apparent distress alert and oriented x3 mood and affect normal, dressed appropriately.  HEENT: Pupils equal, extraocular movements intact  Respiratory: Patient's speak in full sentences and does not appear short of breath  Cardiovascular: Trace lower extremity edema, non tender, [name] erythema  Gait severely antalgic Patient's back has significant scoliosis.  Patient does favor the right leg somewhat.  Patient does have a Trendelenburg positive.  Patient does have tightness with straight leg test bilaterally    Impression and Recommendations:     The above documentation has been reviewed and is accurate and complete Lyndal Pulley, DO       Note: This dictation was prepared with Dragon dictation along with smaller phrase technology. Any transcriptional errors that result from this process are unintentional.

## 2020-06-10 NOTE — Patient Instructions (Signed)
Rocker bottom shoes Monitor back See me before [place]

## 2020-06-11 ENCOUNTER — Encounter: Payer: Self-pay | Admitting: Family Medicine

## 2020-06-11 ENCOUNTER — Other Ambulatory Visit: Payer: Self-pay

## 2020-06-11 DIAGNOSIS — M47896 Other spondylosis, lumbar region: Secondary | ICD-10-CM

## 2020-06-20 ENCOUNTER — Telehealth: Payer: Self-pay | Admitting: Family

## 2020-06-20 ENCOUNTER — Inpatient Hospital Stay: Payer: Medicare Other

## 2020-06-20 ENCOUNTER — Other Ambulatory Visit: Payer: Self-pay

## 2020-06-20 ENCOUNTER — Encounter: Payer: Self-pay | Admitting: Family

## 2020-06-20 ENCOUNTER — Inpatient Hospital Stay (HOSPITAL_BASED_OUTPATIENT_CLINIC_OR_DEPARTMENT_OTHER): Payer: Medicare Other | Admitting: Family

## 2020-06-20 LAB — CMP (CANCER CENTER ONLY)
ALT: 12 U/L (ref 0–44)
AST: 20 U/L (ref 15–41)
Albumin: 4.5 g/dL (ref 3.5–5.0)
Alkaline Phosphatase: 83 U/L (ref 38–126)
Anion gap: 10 (ref 5–15)
BUN: 18 mg/dL (ref 8–23)
CO2: 26 mmol/L (ref 22–32)
Calcium: 9.8 mg/dL (ref 8.9–10.3)
Chloride: 101 mmol/L (ref 98–111)
Creatinine: 0.68 mg/dL (ref 0.44–1.00)
GFR, Est AFR Am: >60 mL/min (ref >60)
GFR, Estimated: >60 mL/min (ref >60)
Glucose, Bld: 95 mg/dL (ref 70–99)
Potassium: 3.6 mmol/L (ref 3.5–5.1)
Sodium: 137 mmol/L (ref 135–145)
Total Bilirubin: 0.8 mg/dL (ref 0.3–1.2)
Total Protein: 7.1 g/dL (ref 6.5–8.1)

## 2020-06-20 LAB — CBC WITH DIFFERENTIAL (CANCER CENTER ONLY)
Abs Immature Granulocytes: 0.02 10*3/uL (ref 0.00–0.07)
Basophils Absolute: 0.1 10*3/uL (ref 0.0–0.1)
Basophils Relative: 1 %
Eosinophils Absolute: 0.1 10*3/uL (ref 0.0–0.5)
Eosinophils Relative: 1 %
HCT: 42.5 % (ref 36.0–46.0)
Hemoglobin: 14.6 g/dL (ref 12.0–15.0)
Immature Granulocytes: 0 %
Lymphocytes Relative: 33 %
Lymphs Abs: 3 10*3/uL (ref 0.7–4.0)
MCH: 31.9 pg (ref 26.0–34.0)
MCHC: 34.4 g/dL (ref 30.0–36.0)
MCV: 93 fL (ref 80.0–100.0)
Monocytes Absolute: 0.9 10*3/uL (ref 0.1–1.0)
Monocytes Relative: 10 %
Neutro Abs: 5 10*3/uL (ref 1.7–7.7)
Neutrophils Relative %: 55 %
Platelet Count: 297 10*3/uL (ref 150–400)
RBC: 4.57 MIL/uL (ref 3.87–5.11)
RDW: 13.2 % (ref 11.5–15.5)
WBC Count: 9.1 10*3/uL (ref 4.0–10.5)
nRBC: 0 % (ref 0.0–0.2)

## 2020-06-20 NOTE — Telephone Encounter (Signed)
Appointments scheduled calendar printed & mailed per 8/19 los

## 2020-06-20 NOTE — Progress Notes (Signed)
Hematology and Oncology Follow Up Visit  Maria [name] [id number] September 23, 1936 84 y.o. 06/20/2020   Principle Diagnosis:  Hemochromatosis, homozygous for the C282Y mutation  Current Therapy: Phlebotomy to maintain iron saturation < 50% and ferritin < 100   Interim History:  Maria [name] is here today for follow-up. She is doing well and has no complaints at this time.  She tolerated her last 3 phlebotomies nicely.  He has occasional abdominal bloating.  She just had her colonoscopy on 8/2 which showed only some diverticulosis.  She has not noted any blood loss. No bruising or petechiae.  No fever, chills, n/v, cough, rash, dizziness, SOB, chest pain, palpitations, abdominal pain or changes in bowel or bladder habits.  No swelling, tenderness, numbness or tingling in her extremities at this time.  No falls or syncope.  She has maintained a good appetite and is staying well hydrated. Her weight is stable.   ECOG Performance Status: 1 - Symptomatic but completely ambulatory  Medications:  Allergies as of 06/20/2020   No Known Allergies     Medication List       Accurate as of June 20, 2020 11:51 AM. If you have any questions, ask your nurse or doctor.        STOP taking these medications   gabapentin 100 MG capsule Commonly known as: NEURONTIN Stopped by: [name], NP   Plenvu 140 g Solr Generic drug: PEG-KCl-NaCl-NaSulf-Na Asc-C Stopped by: [name], NP     TAKE these medications   acetaminophen 500 MG tablet Commonly known as: TYLENOL Take 500 mg by mouth every 6 (six) hours as needed.   amLODipine 5 MG tablet Commonly known as: NORVASC TAKE 1.5 TABLETS (7.5 MG TOTAL) BY MOUTH DAILY.   DULoxetine 30 MG capsule Commonly known as: CYMBALTA TAKE 1 CAPSULE BY MOUTH EVERY DAY   ibuprofen 200 MG tablet Commonly known as: ADVIL Take 200 mg by mouth every 6 (six) hours as needed for headache.   traMADol 50 MG tablet Commonly known  as: ULTRAM Take 1 tablet (50 mg total) by mouth 2 (two) times daily as needed.       Allergies: No Known Allergies  Past Medical History, Surgical history, Social history, and Family History were reviewed and updated.  Review of Systems: All other 10 point review of systems is negative.   Physical Exam:  height is 5\' 5"  (1.651 m) and weight is 168 lb 0.6 oz (76.2 kg). Her oral temperature is 98.2 F (36.8 C). Her blood pressure is 139/84 and her pulse is 88. Her respiration is 18 and oxygen saturation is 100%.   Wt Readings from Last 3 Encounters:  06/20/20 168 lb 0.6 oz (76.2 kg)  06/10/20 167 lb (75.8 kg)  06/03/20 169 lb (76.7 kg)    Ocular: Sclerae unicteric, pupils equal, round and reactive to light Ear-nose-throat: Oropharynx clear, dentition fair Lymphatic: No cervical or supraclavicular adenopathy Lungs no rales or rhonchi, good excursion bilaterally Heart regular rate and rhythm, no murmur appreciated Abd soft, nontender, positive bowel sounds, no liver or spleen tip palpated on exam, no fluid wave  MSK no focal spinal tenderness, no joint edema Neuro: non-focal, well-oriented, appropriate affect Breasts: Deferred   Lab Results  Component Value Date   WBC 9.1 06/20/2020   HGB 14.6 06/20/2020   HCT 42.5 06/20/2020   MCV 93.0 06/20/2020   PLT 297 06/20/2020   Lab Results  Component Value Date   FERRITIN 587 (H) 04/19/2020   IRON 163 (  H) 04/19/2020   TIBC 281 04/19/2020   UIBC 119 (L) 04/19/2020   IRONPCTSAT 58 (H) 04/19/2020   Lab Results  Component Value Date   RETICCTPCT 1.7 08/28/2019   RBC 4.57 06/20/2020   No results found for: KPAFRELGTCHN, LAMBDASER, KAPLAMBRATIO No results found for: IGGSERUM, IGA, IGMSERUM No results found for: Odetta Pink, SPEI   Chemistry      Component Value Date/Time   NA 137 06/20/2020 1113   NA 140 09/13/2012 0916   K 3.6 06/20/2020 1113   K 3.8 09/13/2012 0916     CL 101 06/20/2020 1113   CL 104 09/13/2012 0916   CO2 26 06/20/2020 1113   CO2 28 09/13/2012 0916   BUN 18 06/20/2020 1113   BUN 16.0 09/13/2012 0916   CREATININE 0.68 06/20/2020 1113   CREATININE 0.7 09/13/2012 0916      Component Value Date/Time   CALCIUM 9.8 06/20/2020 1113   CALCIUM 9.5 09/13/2012 0916   ALKPHOS 83 06/20/2020 1113   ALKPHOS 76 09/13/2012 0916   AST 20 06/20/2020 1113   AST 25 09/13/2012 0916   ALT 12 06/20/2020 1113   ALT 25 09/13/2012 0916   BILITOT 0.8 06/20/2020 1113   BILITOT 0.94 09/13/2012 0916       Impression and Plan: Maria [name] is a very pleasant 84yo caucasian female with hemochromatosis, heterozygous for the C282Y mutation. We will see how her iron looks and phlebotomize her if needed. She is very interested in donating with [facility].  We will plan to see her again in November when she returns from [place].  She can contact our office with any questions or concerns.   [name], NP 8/19/202111:51 AM

## 2020-06-21 LAB — IRON AND TIBC
Iron: 202 ug/dL — ABNORMAL HIGH (ref 41–142)
Saturation Ratios: 68 % — ABNORMAL HIGH (ref 21–57)
TIBC: 298 ug/dL (ref 236–444)
UIBC: 95 ug/dL — ABNORMAL LOW (ref 120–384)

## 2020-06-21 LAB — FERRITIN: Ferritin: 408 ng/mL — ABNORMAL HIGH (ref 11–307)

## 2020-06-22 ENCOUNTER — Other Ambulatory Visit: Payer: Self-pay | Admitting: Family Medicine

## 2020-06-27 ENCOUNTER — Other Ambulatory Visit: Payer: Self-pay

## 2020-06-27 ENCOUNTER — Inpatient Hospital Stay: Payer: Medicare Other

## 2020-06-27 NOTE — Patient Instructions (Signed)
Therapeutic Phlebotomy Therapeutic phlebotomy is the planned removal of blood from a person's body for the purpose of treating a medical condition. The procedure is similar to donating blood. Usually, about a pint (470 mL, or 0.47 L) of blood is removed. The average adult has 9-12 pints (4.3-5.7 L) of blood in the body. Therapeutic phlebotomy may be used to treat the following medical conditions:  Hemochromatosis. This is a condition in which the blood contains too much iron.  Polycythemia vera. This is a condition in which the blood contains too many red blood cells.  Porphyria cutanea tarda. This is a disease in which an important part of hemoglobin is not made properly. It results in the buildup of abnormal amounts of porphyrins in the body.  Sickle cell disease. This is a condition in which the red blood cells form an abnormal crescent shape rather than a round shape. Tell a health care provider about:  Any allergies you have.  All medicines you are taking, including vitamins, herbs, eye drops, creams, and over-the-counter medicines.  Any problems you or family members have had with anesthetic medicines.  Any blood disorders you have.  Any surgeries you have had.  Any medical conditions you have.  Whether you are pregnant or may be pregnant. What are the risks? Generally, this is a safe procedure. However, problems may occur, including:  Nausea or light-headedness.  Low blood pressure (hypotension).  Soreness, bleeding, swelling, or bruising at the needle insertion site.  Infection. What happens before the procedure?  Follow instructions from your health care provider about eating or drinking restrictions.  Ask your health care provider about: ? Changing or stopping your regular medicines. This is especially important if you are taking diabetes medicines or blood thinners (anticoagulants). ? Taking medicines such as aspirin and ibuprofen. These medicines can thin your  blood. Do not take these medicines unless your health care provider tells you to take them. ? Taking over-the-counter medicines, vitamins, herbs, and supplements.  Wear clothing with sleeves that can be raised above the elbow.  Plan to have someone take you home from the hospital or clinic.  You may have a blood sample taken.  Your blood pressure, pulse rate, and breathing rate will be measured. What happens during the procedure?   To lower your risk of infection: ? Your health care team will wash or sanitize their hands. ? Your skin will be cleaned with an antiseptic.  You may be given a medicine to numb the area (local anesthetic).  A tourniquet will be placed on your arm.  A needle will be inserted into one of your veins.  Tubing and a collection bag will be attached to that needle.  Blood will flow through the needle and tubing into the collection bag.  The collection bag will be placed lower than your arm to allow gravity to help the flow of blood into the bag.  You may be asked to open and close your hand slowly and continually during the entire collection.  After the specified amount of blood has been removed from your body, the collection bag and tubing will be clamped.  The needle will be removed from your vein.  Pressure will be held on the site of the needle insertion to stop the bleeding.  A bandage (dressing) will be placed over the needle insertion site. The procedure may vary among health care providers and hospitals. What happens after the procedure?  Your blood pressure, pulse rate, and breathing rate will be   measured after the procedure.  You will be encouraged to drink fluids.  Your recovery will be assessed and monitored.  You can return to your normal activities as told by your health care provider. Summary  Therapeutic phlebotomy is the planned removal of blood from a person's body for the purpose of treating a medical condition.  Therapeutic  phlebotomy may be used to treat hemochromatosis, polycythemia vera, porphyria cutanea tarda, or sickle cell disease.  In the procedure, a needle is inserted and about a pint (470 mL, or 0.47 L) of blood is removed. The average adult has 9-12 pints (4.3-5.7 L) of blood in the body.  This is generally a safe procedure, but it can sometimes cause problems such as nausea, light-headedness, or low blood pressure (hypotension). This information is not intended to replace advice given to you by your health care provider. Make sure you discuss any questions you have with your health care provider. Document Revised: 11/04/2017 Document Reviewed: 11/04/2017 Elsevier Patient Education  2020 [employer] Inc[employer]  

## 2020-06-27 NOTE — Progress Notes (Signed)
[name] presents today for phlebotomy per MD orders. Phlebotomy procedure started at 1052 and ended at 1110. 509 grams removed via 20 gauge needle to Left AC. Patient observed for 30 minutes after procedure without any incident. Patient tolerated procedure well. IV needle removed intact.

## 2020-07-04 ENCOUNTER — Other Ambulatory Visit: Payer: Self-pay

## 2020-07-04 ENCOUNTER — Telehealth (INDEPENDENT_AMBULATORY_CARE_PROVIDER_SITE_OTHER): Payer: Medicare Other | Admitting: Physician Assistant

## 2020-07-04 ENCOUNTER — Encounter: Payer: Self-pay | Admitting: Physician Assistant

## 2020-07-04 DIAGNOSIS — J069 Acute upper respiratory infection, unspecified: Secondary | ICD-10-CM

## 2020-07-04 MED ORDER — BENZONATATE 100 MG PO CAPS
100.0000 mg | ORAL_CAPSULE | Freq: Three times a day (TID) | ORAL | 0 refills | Status: DC | PRN
Start: 2020-07-04 — End: 2020-09-20

## 2020-07-04 NOTE — Progress Notes (Signed)
Virtual Visit via Video   I connected with patient on 07/04/20 at 11:00 AM EDT by a video enabled telemedicine application and verified that I am speaking with the correct person using two identifiers.  Location patient: Home Location provider: [facility], Office Persons participating in the virtual visit: Patient, Provider, [place] (Patina Moore)  I discussed the limitations of evaluation and management by telemedicine and the availability of in person appointments. The patient expressed understanding and agreed to proceed.  Subjective:   HPI:   Patient presents via [facility] today 5-6 days of mild rhinorrhea, nasal congestion now with sore throat and voice hoarseness. Also notes a mild, dry cough with this. Denies any significant chest congestion but has noted a tightness in chest , mainly with exertion.  Denies fevers but notes chills. Denies sinus pain, ear pain or tooth pain. Does note a mild headache. Denies loss of taste or smell. Denies recent travel. Was around her grandson who was diagnosed with a sinus infection -- fully resolved. Notes today her voice is better. Patient has been taking Wynetta Fines for her symptoms. Is staying well hydrated.   COVID test scheduled for Sunday.    ROS:   See pertinent positives and negatives per HPI.  Patient Active Problem List   Diagnosis Date Noted  . Arthritis of midfoot 06/10/2020  . HTN (hypertension) 11/08/2019  . Hemochromatosis 09/06/2019  . Educated about COVID-19 virus infection 03/27/2019  . History of total knee replacement, left 11/04/2018  . Preventative health care 06/16/2016  . Knee pain 06/16/2016  . OSA (obstructive sleep apnea) 06/15/2016  . Depression 05/14/2016  . Multiple thyroid nodules 11/24/2015  . Degenerative arthritis of lumbar spine 09/03/2014  . Hyponatremia 12/12/2013  . Hypokalemia 12/12/2013  . OA (osteoarthritis) of knee 12/11/2013  . Osteoarthritis 11/14/2013  . BENIGN POSITIONAL  VERTIGO 01/17/2010  . Urinary incontinence 04/26/2009  . Hyperlipidemia, mixed 02/04/2009  . Vitamin D deficiency 10/23/2008  . POLYARTHRALGIA 10/23/2008  . Osteoporosis 10/23/2008  . NEOPLASM, MALIGNANT, LUNG, NON-SMALL CELL 11/21/2007  . DIVERTICULOSIS 06/15/2007  . GOITER, NODULAR 04/14/2007    Social History   Tobacco Use  . Smoking status: Former Smoker    Years: 20.00    Types: Cigarettes    Quit date: 05/03/1979    Years since quitting: 41.2  . Smokeless tobacco: Never Used  . Tobacco comment: smoked 1962-1980, up to < 1 ppd  Substance Use Topics  . Alcohol use: Yes    Alcohol/week: 7.0 standard drinks    Types: 7 Standard drinks or equivalent per week    Comment: occasional    Current Outpatient Medications:  .  acetaminophen (TYLENOL) 500 MG tablet, Take 500 mg by mouth every 6 (six) hours as needed., Disp: , Rfl:  .  amLODipine (NORVASC) 5 MG tablet, TAKE 1.5 TABLETS (7.5 MG TOTAL) BY MOUTH DAILY., Disp: 135 tablet, Rfl: 1 .  DULoxetine (CYMBALTA) 30 MG capsule, Take 1 capsule (30 mg total) by mouth daily., Disp: 90 capsule, Rfl: 0 .  ibuprofen (ADVIL) 200 MG tablet, Take 200 mg by mouth every 6 (six) hours as needed for headache., Disp: , Rfl:  .  traMADol (ULTRAM) 50 MG tablet, Take 1 tablet (50 mg total) by mouth 2 (two) times daily as needed., Disp: 30 tablet, Rfl: 0  No Known Allergies  Objective:   There were no vitals taken for this visit.  Patient is well-developed, well-nourished in no acute distress.  Resting comfortably at home.  Head is normocephalic,  atraumatic.  No labored breathing.  Speech is clear and coherent with logical content.  Patient is alert and oriented at baseline.   Assessment and Plan:   1. Viral URI Very mild symptoms already proving.  Lower concern for Covid but patient has appointment to get tested just to be cautious.  Supportive measures and OTC medications reviewed with patient in detail.  Rx Tessalon to help for cough if  needed.  Instructions sent to MyChart with patient view.  Have made a reminder to call patient tomorrow to check in on her before the weekend.    Leeanne Rio, PA-C 07/04/2020

## 2020-07-04 NOTE — Patient Instructions (Signed)
Instructions sent to MyChart

## 2020-07-05 MED ORDER — DOXYCYCLINE HYCLATE 100 MG PO TABS: 100.0000 mg | ORAL_TABLET | Freq: Two times a day (BID) | ORAL | 0 refills | Status: DC

## 2020-07-07 ENCOUNTER — Encounter: Payer: Self-pay | Admitting: Family

## 2020-07-07 ENCOUNTER — Encounter: Payer: Self-pay | Admitting: Family Medicine

## 2020-07-10 ENCOUNTER — Encounter: Payer: Self-pay | Admitting: Physician Assistant

## 2020-07-11 ENCOUNTER — Inpatient Hospital Stay: Payer: Medicare Other

## 2020-07-15 ENCOUNTER — Other Ambulatory Visit: Payer: Medicare Other

## 2020-07-21 ENCOUNTER — Ambulatory Visit (HOSPITAL_COMMUNITY)
Admission: EM | Admit: 2020-07-21 | Discharge: 2020-07-21 | Disposition: A | Discharge status: Home / Self Care | Payer: Medicare Other | Attending: Emergency Medicine | Admitting: Emergency Medicine

## 2020-07-21 ENCOUNTER — Encounter (HOSPITAL_COMMUNITY): Payer: Self-pay | Admitting: Emergency Medicine

## 2020-07-21 ENCOUNTER — Ambulatory Visit (INDEPENDENT_AMBULATORY_CARE_PROVIDER_SITE_OTHER): Payer: Medicare Other

## 2020-07-21 ENCOUNTER — Other Ambulatory Visit: Payer: Self-pay

## 2020-07-21 DIAGNOSIS — E876 Hypokalemia: Secondary | ICD-10-CM | POA: Insufficient documentation

## 2020-07-21 DIAGNOSIS — Z87891 Personal history of nicotine dependence: Secondary | ICD-10-CM | POA: Insufficient documentation

## 2020-07-21 DIAGNOSIS — Z96653 Presence of artificial knee joint, bilateral: Secondary | ICD-10-CM | POA: Insufficient documentation

## 2020-07-21 DIAGNOSIS — R05 Cough: Secondary | ICD-10-CM

## 2020-07-21 DIAGNOSIS — R059 Cough, unspecified: Secondary | ICD-10-CM

## 2020-07-21 DIAGNOSIS — Z792 Long term (current) use of antibiotics: Secondary | ICD-10-CM | POA: Insufficient documentation

## 2020-07-21 DIAGNOSIS — F329 Major depressive disorder, single episode, unspecified: Secondary | ICD-10-CM | POA: Diagnosis not present

## 2020-07-21 DIAGNOSIS — Z85118 Personal history of other malignant neoplasm of bronchus and lung: Secondary | ICD-10-CM | POA: Diagnosis not present

## 2020-07-21 DIAGNOSIS — Z20822 Contact with and (suspected) exposure to covid-19: Secondary | ICD-10-CM | POA: Insufficient documentation

## 2020-07-21 DIAGNOSIS — E782 Mixed hyperlipidemia: Secondary | ICD-10-CM | POA: Insufficient documentation

## 2020-07-21 DIAGNOSIS — I341 Nonrheumatic mitral (valve) prolapse: Secondary | ICD-10-CM | POA: Insufficient documentation

## 2020-07-21 DIAGNOSIS — Z79899 Other long term (current) drug therapy: Secondary | ICD-10-CM | POA: Insufficient documentation

## 2020-07-21 DIAGNOSIS — G4733 Obstructive sleep apnea (adult) (pediatric): Secondary | ICD-10-CM | POA: Insufficient documentation

## 2020-07-21 DIAGNOSIS — I10 Essential (primary) hypertension: Secondary | ICD-10-CM | POA: Insufficient documentation

## 2020-07-21 LAB — SARS CORONAVIRUS 2 (TAT 6-24 HRS): SARS Coronavirus 2: NEGATIVE

## 2020-07-21 MED ORDER — PREDNISONE 10 MG (21) PO TBPK
ORAL_TABLET | Freq: Every day | ORAL | 0 refills | Status: DC
Start: 2020-07-21 — End: 2020-09-20

## 2020-07-21 NOTE — ED Provider Notes (Signed)
[place]    CSN: [id number] Arrival date & time: 07/21/20  1352      History   Chief Complaint Chief Complaint  Patient presents with   Cough    HPI Maria [name] is a 84 y.o. female.   Maria [name] presents with complaints of cough. Talking triggers the cough. Once she starts it is hard to stop which makes her gag. Started approximately 6 weeks ago- had runny nose at the time of onset, as well as sore throat. Nasal drainage and sore throat have resolved. She was provided doxycycline, but cough persists. No shortness of breath. No chest pain . No fevers. No known ill contacts. Has had her covid-19 vaccine. negative covid test on 9/5. Green mucus production. Right upper lung lobectomy, history of malignancy (approximately 2001). No asthma or COPD history. History of smoking, quit >50 years ago.    ROS per HPI, negative if not otherwise mentioned.      Past Medical History:  Diagnosis Date   Cataract    Complication of anesthesia    slow to awaken   Depression    Diverticulosis    Goiter, nodular    [place] of thyroid stable 7/08   Headache    hx of   Heart murmur    due to rheumatic fever as child   Hemochromatosis, hereditary ([place]) 09/01/2019   Mitral valve prolapse    Multiple thyroid nodules 11/24/2015   Neoplasm of lung, malignant ([place]) 2002   Non-small cell surgical tx   Osteoarthritis 11/14/2013   2010 intra-articular steroids X 3 S/P Physical Therapy in 12/14 TKR 12/11/13    Osteopenia    Dr Pamala Hurry   PONV (postoperative nausea and vomiting)    n/v   Sleep apnea    Vitamin D deficiency     Patient Active Problem List   Diagnosis Date Noted   Arthritis of midfoot 06/10/2020   HTN (hypertension) 11/08/2019   Hemochromatosis 09/06/2019   Educated about COVID-19 virus infection 03/27/2019   History of total knee replacement, left 11/04/2018   Preventative health care 06/16/2016   Knee pain 06/16/2016   OSA  (obstructive sleep apnea) 06/15/2016   Depression 05/14/2016   Multiple thyroid nodules 11/24/2015   Degenerative arthritis of lumbar spine 09/03/2014   Hyponatremia 12/12/2013   Hypokalemia 12/12/2013   OA (osteoarthritis) of knee 12/11/2013   Osteoarthritis 11/14/2013   BENIGN POSITIONAL VERTIGO 01/17/2010   Urinary incontinence 04/26/2009   Hyperlipidemia, mixed 02/04/2009   Vitamin D deficiency 10/23/2008   POLYARTHRALGIA 10/23/2008   Osteoporosis 10/23/2008   NEOPLASM, MALIGNANT, LUNG, NON-SMALL CELL 11/21/2007   DIVERTICULOSIS 06/15/2007   GOITER, NODULAR 04/14/2007    Past Surgical History:  Procedure Laterality Date   ABDOMINAL HYSTERECTOMY  1998   No BSO, for dysfunctional menses   BLADDER SUSPENSION Bilateral    BREAST BIOPSY Right    COLONOSCOPY  2009 , 2014   Diverticulosis; Dr. Olevia Perches   LOBECTOMY  12/02   RU; no radiation or chemo, Dr. Arlyce Dice   TONSILLECTOMY     TOTAL KNEE ARTHROPLASTY Right 12/11/2013   Procedure: RIGHT TOTAL KNEE ARTHROPLASTY;  Surgeon: Gearlean Alf, MD;  Location: [facility];  Service: Orthopedics;  Laterality: Right;   TOTAL KNEE ARTHROPLASTY Left 06/28/2017   Procedure: LEFT TOTAL KNEE ARTHROPLASTY;  Surgeon: Gaynelle Arabian, MD;  Location: [facility];  Service: Orthopedics;  Laterality: Left;  Adductor Block    OB History   No obstetric history on file.  Home Medications    Prior to Admission medications   Medication Sig Start Date End Date Taking? Authorizing Provider  amLODipine (NORVASC) 5 MG tablet TAKE 1.5 TABLETS (7.5 MG TOTAL) BY MOUTH DAILY. 01/19/20  Yes Mosie Lukes, MD  DULoxetine (CYMBALTA) 30 MG capsule Take 1 capsule (30 mg total) by mouth daily. 06/24/20  Yes Mosie Lukes, MD  ibuprofen (ADVIL) 200 MG tablet Take 200 mg by mouth every 6 (six) hours as needed for headache.   Yes [provider]  NON FORMULARY Cough medicine OTC   Yes [provider]  acetaminophen (TYLENOL) 500  MG tablet Take 500 mg by mouth every 6 (six) hours as needed.    [provider]  benzonatate (TESSALON) 100 MG capsule Take 1 capsule (100 mg total) by mouth 3 (three) times daily as needed for cough. 07/04/20   Brunetta Jeans, PA-C  doxycycline (VIBRA-TABS) 100 MG tablet Take 1 tablet (100 mg total) by mouth 2 (two) times daily. 07/05/20   Brunetta Jeans, PA-C  predniSONE (STERAPRED UNI-PAK 21 TAB) 10 MG (21) TBPK tablet Take by mouth daily. Per box instruction 07/21/20   Zigmund Gottron, NP  traMADol (ULTRAM) 50 MG tablet Take 1 tablet (50 mg total) by mouth 2 (two) times daily as needed. 01/24/20   Mosie Lukes, MD    Family History Family History  Problem Relation Age of Onset   COPD Mother    Coronary artery disease Father    Colon cancer Father 75   Heart attack Father 19   Stroke Father        > 10   Lung cancer Paternal Aunt        smoker   Cancer Paternal Aunt        lung   Lung cancer Maternal Grandfather        smoker   COPD Maternal Grandfather    Arthritis Sister    Lung cancer Maternal Uncle        smoker   Diabetes Neg Hx     Social History Social History   Tobacco Use   Smoking status: Former Smoker    Years: 20.00    Types: Cigarettes    Quit date: 05/03/1979    Years since quitting: 41.2   Smokeless tobacco: Never Used   Tobacco comment: smoked 1962-1980, up to < 1 ppd  Vaping Use   Vaping Use: Never used  Substance Use Topics   Alcohol use: Yes    Alcohol/week: 7.0 standard drinks    Types: 7 Standard drinks or equivalent per week    Comment: occasional   Drug use: No     Allergies   Patient has no known allergies.   Review of Systems Review of Systems   Physical Exam Triage Vital Signs ED Triage Vitals  Enc Vitals Group     BP 07/21/20 1454 136/88     Pulse Rate 07/21/20 1454 87     Resp 07/21/20 1454 (!) 22     Temp 07/21/20 1454 98.7 F (37.1 C)     Temp Source 07/21/20 1454 Oral     SpO2 07/21/20  1454 97 %     Weight --      Height --      Head Circumference --      Peak Flow --      Pain Score 07/21/20 1449 1     Pain Loc --      Pain Edu? --  Excl. in GC? --    No data found.  Updated Vital Signs BP 136/88 (BP Location: Right Arm)    Pulse 87    Temp 98.7 F (37.1 C) (Oral)    Resp (!) 22    SpO2 97%    Physical Exam Constitutional:      General: She is not in acute distress.    Appearance: She is well-developed.  Cardiovascular:     Rate and Rhythm: Normal rate.  Pulmonary:     Effort: Pulmonary effort is normal.     Breath sounds: Normal breath sounds and air entry. No wheezing, rhonchi or rales.     Comments: Dry cough triggered with inspiration; no work of breathing with speaking  Skin:    General: Skin is warm and dry.  Neurological:     Mental Status: She is alert and oriented to person, place, and time.      UC Treatments / Results  Labs (all labs ordered are listed, but only abnormal results are displayed) Labs Reviewed  SARS CORONAVIRUS 2 (TAT 6-24 HRS)    EKG   Radiology DG Chest 2 View  Result Date: 07/21/2020 CLINICAL DATA:  Productive cough for 6 weeks EXAM: CHEST - 2 VIEW COMPARISON:  08/06/2016 chest radiograph. FINDINGS: Stable cardiomediastinal silhouette with normal heart size. No pneumothorax. No pleural effusion. Lungs appear clear, with no acute consolidative airspace disease and no pulmonary edema. Right hilar surgical clips again noted. IMPRESSION: No active cardiopulmonary disease. Electronically Signed   By: Ilona Sorrel M.D.   On: 07/21/2020 16:07    Procedures Procedures (including critical care time)  Medications Ordered in UC Medications - No data to display  Initial Impression / Assessment and Plan / UC Course  I have reviewed the triage vital signs and the nursing notes.  Pertinent labs & imaging results that were available during my care of the patient were reviewed by me and considered in my medical decision  making (see chart for details).     Non toxic. Benign physical exam.  No swelling. No work of breathing. No chest pain . cxr clear today. URI initially. Residual inflammatory changes triggering cough? Prednisone provided and encouraged follow up with PCP as may need further evaluation and management if persistent. Patient verbalized understanding and agreeable to plan.   Final Clinical Impressions(s) / UC Diagnoses   Final diagnoses:  Cough     Discharge Instructions     Your chest xray is normal today.  I have sent for a course of steroids to see if this is helpful with concern for residual inflammation from the URI you had at the beginning of illness.  Please follow up with your primary care provider in the next few weeks for recheck, as you may need further evaluation if your symptoms continue to persist. Return or go to the ER for any worsening of symptoms.     ED Prescriptions    Medication Sig Dispense Auth. Provider   predniSONE (STERAPRED UNI-PAK 21 TAB) 10 MG (21) TBPK tablet Take by mouth daily. Per box instruction 21 tablet Zigmund Gottron, NP     PDMP not reviewed this encounter.   Zigmund Gottron, NP 07/21/20 1658

## 2020-07-21 NOTE — ED Triage Notes (Signed)
Cough for 6 weeks.  Has had antibiotics, and it is better, but cough continues.  Patient worried about pneumonia and concerned if lung cancer has returned.    Minimal phlegm, but is green.  And slight headache.    No fever

## 2020-07-21 NOTE — Discharge Instructions (Addendum)
Your chest xray is normal today.  I have sent for a course of steroids to see if this is helpful with concern for residual inflammation from the URI you had at the beginning of illness.  Please follow up with your primary care provider in the next few weeks for recheck, as you may need further evaluation if your symptoms continue to persist. Return or go to the ER for any worsening of symptoms.

## 2020-07-22 ENCOUNTER — Inpatient Hospital Stay: Payer: Medicare Other | Attending: Hematology & Oncology

## 2020-07-22 NOTE — Progress Notes (Signed)
[name] presents today for phlebotomy per MD orders. Phlebotomy procedure started at 1015 via 16 g phlebotomy kit and ended at 1030. 542 grams removed. Patient observed for 30 minutes after procedure without any incident. Patient tolerated procedure well. IV needle removed intact.

## 2020-07-22 NOTE — Patient Instructions (Signed)
Therapeutic Phlebotomy Therapeutic phlebotomy is the planned removal of blood from a person's body for the purpose of treating a medical condition. The procedure is similar to donating blood. Usually, about a pint (470 mL, or 0.47 L) of blood is removed. The average adult has 9-12 pints (4.3-5.7 L) of blood in the body. Therapeutic phlebotomy may be used to treat the following medical conditions:  Hemochromatosis. This is a condition in which the blood contains too much iron.  Polycythemia vera. This is a condition in which the blood contains too many red blood cells.  Porphyria cutanea tarda. This is a disease in which an important part of hemoglobin is not made properly. It results in the buildup of abnormal amounts of porphyrins in the body.  Sickle cell disease. This is a condition in which the red blood cells form an abnormal crescent shape rather than a round shape. Tell a health care provider about:  Any allergies you have.  All medicines you are taking, including vitamins, herbs, eye drops, creams, and over-the-counter medicines.  Any problems you or family members have had with anesthetic medicines.  Any blood disorders you have.  Any surgeries you have had.  Any medical conditions you have.  Whether you are pregnant or may be pregnant. What are the risks? Generally, this is a safe procedure. However, problems may occur, including:  Nausea or light-headedness.  Low blood pressure (hypotension).  Soreness, bleeding, swelling, or bruising at the needle insertion site.  Infection. What happens before the procedure?  Follow instructions from your health care provider about eating or drinking restrictions.  Ask your health care provider about: ? Changing or stopping your regular medicines. This is especially important if you are taking diabetes medicines or blood thinners (anticoagulants). ? Taking medicines such as aspirin and ibuprofen. These medicines can thin your  blood. Do not take these medicines unless your health care provider tells you to take them. ? Taking over-the-counter medicines, vitamins, herbs, and supplements.  Wear clothing with sleeves that can be raised above the elbow.  Plan to have someone take you home from the hospital or clinic.  You may have a blood sample taken.  Your blood pressure, pulse rate, and breathing rate will be measured. What happens during the procedure?   To lower your risk of infection: ? Your health care team will wash or sanitize their hands. ? Your skin will be cleaned with an antiseptic.  You may be given a medicine to numb the area (local anesthetic).  A tourniquet will be placed on your arm.  A needle will be inserted into one of your veins.  Tubing and a collection bag will be attached to that needle.  Blood will flow through the needle and tubing into the collection bag.  The collection bag will be placed lower than your arm to allow gravity to help the flow of blood into the bag.  You may be asked to open and close your hand slowly and continually during the entire collection.  After the specified amount of blood has been removed from your body, the collection bag and tubing will be clamped.  The needle will be removed from your vein.  Pressure will be held on the site of the needle insertion to stop the bleeding.  A bandage (dressing) will be placed over the needle insertion site. The procedure may vary among health care providers and hospitals. What happens after the procedure?  Your blood pressure, pulse rate, and breathing rate will be   measured after the procedure.  You will be encouraged to drink fluids.  Your recovery will be assessed and monitored.  You can return to your normal activities as told by your health care provider. Summary  Therapeutic phlebotomy is the planned removal of blood from a person's body for the purpose of treating a medical condition.  Therapeutic  phlebotomy may be used to treat hemochromatosis, polycythemia vera, porphyria cutanea tarda, or sickle cell disease.  In the procedure, a needle is inserted and about a pint (470 mL, or 0.47 L) of blood is removed. The average adult has 9-12 pints (4.3-5.7 L) of blood in the body.  This is generally a safe procedure, but it can sometimes cause problems such as nausea, light-headedness, or low blood pressure (hypotension). This information is not intended to replace advice given to you by your health care provider. Make sure you discuss any questions you have with your health care provider. Document Revised: 11/04/2017 Document Reviewed: 11/04/2017 Elsevier Patient Education  2020 [employer] Inc[employer]  

## 2020-09-10 ENCOUNTER — Other Ambulatory Visit: Payer: Self-pay | Admitting: Family Medicine

## 2020-09-20 ENCOUNTER — Encounter: Payer: Self-pay | Admitting: Family

## 2020-09-20 ENCOUNTER — Inpatient Hospital Stay (HOSPITAL_BASED_OUTPATIENT_CLINIC_OR_DEPARTMENT_OTHER): Payer: Medicare Other | Admitting: Family

## 2020-09-20 ENCOUNTER — Inpatient Hospital Stay: Payer: Medicare Other

## 2020-09-20 ENCOUNTER — Other Ambulatory Visit: Payer: Self-pay

## 2020-09-20 ENCOUNTER — Inpatient Hospital Stay: Payer: Medicare Other | Attending: Hematology & Oncology

## 2020-09-20 ENCOUNTER — Telehealth: Payer: Self-pay

## 2020-09-20 DIAGNOSIS — M2550 Pain in unspecified joint: Secondary | ICD-10-CM | POA: Insufficient documentation

## 2020-09-20 DIAGNOSIS — Z79899 Other long term (current) drug therapy: Secondary | ICD-10-CM | POA: Insufficient documentation

## 2020-09-20 LAB — CBC WITH DIFFERENTIAL (CANCER CENTER ONLY)
Abs Immature Granulocytes: 0.03 10*3/uL (ref 0.00–0.07)
Basophils Absolute: 0.1 10*3/uL (ref 0.0–0.1)
Basophils Relative: 1 %
Eosinophils Absolute: 0.2 10*3/uL (ref 0.0–0.5)
Eosinophils Relative: 2 %
HCT: 49 % — ABNORMAL HIGH (ref 36.0–46.0)
Hemoglobin: 16.7 g/dL — ABNORMAL HIGH (ref 12.0–15.0)
Immature Granulocytes: 0 %
Lymphocytes Relative: 35 %
Lymphs Abs: 3.2 10*3/uL (ref 0.7–4.0)
MCH: 31.2 pg (ref 26.0–34.0)
MCHC: 34.1 g/dL (ref 30.0–36.0)
MCV: 91.6 fL (ref 80.0–100.0)
Monocytes Absolute: 0.9 10*3/uL (ref 0.1–1.0)
Monocytes Relative: 10 %
Neutro Abs: 4.8 10*3/uL (ref 1.7–7.7)
Neutrophils Relative %: 52 %
Platelet Count: 286 10*3/uL (ref 150–400)
RBC: 5.35 MIL/uL — ABNORMAL HIGH (ref 3.87–5.11)
RDW: 12.3 % (ref 11.5–15.5)
WBC Count: 9.1 10*3/uL (ref 4.0–10.5)
nRBC: 0 % (ref 0.0–0.2)

## 2020-09-20 LAB — CMP (CANCER CENTER ONLY)
ALT: 11 U/L (ref 0–44)
AST: 22 U/L (ref 15–41)
Albumin: 4.5 g/dL (ref 3.5–5.0)
Alkaline Phosphatase: 99 U/L (ref 38–126)
Anion gap: 10 (ref 5–15)
BUN: 19 mg/dL (ref 8–23)
CO2: 27 mmol/L (ref 22–32)
Calcium: 9.9 mg/dL (ref 8.9–10.3)
Chloride: 100 mmol/L (ref 98–111)
Creatinine: 0.7 mg/dL (ref 0.44–1.00)
GFR, Estimated: >60 mL/min (ref >60)
Glucose, Bld: 102 mg/dL — ABNORMAL HIGH (ref 70–99)
Potassium: 3.9 mmol/L (ref 3.5–5.1)
Sodium: 137 mmol/L (ref 135–145)
Total Bilirubin: 0.8 mg/dL (ref 0.3–1.2)
Total Protein: 7.4 g/dL (ref 6.5–8.1)

## 2020-09-20 NOTE — Progress Notes (Signed)
Maria [name] presents today for phlebotomy per MD orders. Patient tolerated procedure well and received replacement fluids after procedure.  Patient understands to call if he has any questions or concerns post discharge.   Pt discharged in no apparent distress. Pt left ambulatory without assistance. Pt aware of discharge instructions and verbalized understanding and had no further questions.

## 2020-09-20 NOTE — Telephone Encounter (Signed)
Called and left a vm with f/u appts per 09/20/20 los... AOM

## 2020-09-20 NOTE — Progress Notes (Signed)
Hematology and Oncology Follow Up Visit  Maria [name] [id number] 01-25-1936 84 y.o. 09/20/2020   Principle Diagnosis:  Hemochromatosis, homozygous for the C282Y mutation  Current Therapy: Phlebotomy to maintain iron saturation < 50% and ferritin < 100   Interim History:  Maria [name] is here today for follow-up. She is doing well but has noted increased joint pain and swelling in her hands since coming back from [place].  She had a wonderful trip with her friends for 6 weeks.  Hgb is up to 16.7 and Hct 49%. WBC count 9.1 and platelets 286.  Last phlebotomy was in August right before she left on her trip.  No fever, chills, n/v, cough, rash, dizziness, SOB, chest pain, palpitations, abdominal pain or changes in bowel or bladder habits.  No blood loss noted. No bruising or petechiae.  No numbness or tingling in her extremities at this time.  No falls or syncope.  She has maintained a good appetite and is staying well hydrated. Her weight is stable at 172 lbs.   ECOG Performance Status: 1 - Symptomatic but completely ambulatory  Medications:  Allergies as of 09/20/2020   No Known Allergies     Medication List       Accurate as of September 20, 2020 11:44 AM. If you have any questions, ask your nurse or doctor.        acetaminophen 500 MG tablet Commonly known as: TYLENOL Take 500 mg by mouth every 6 (six) hours as needed.   amLODipine 5 MG tablet Commonly known as: NORVASC Take 1.5 tablets (7.5 mg total) by mouth daily. NEEDS OV   benzonatate 100 MG capsule Commonly known as: TESSALON Take 1 capsule (100 mg total) by mouth 3 (three) times daily as needed for cough.   doxycycline 100 MG tablet Commonly known as: VIBRA-TABS Take 1 tablet (100 mg total) by mouth 2 (two) times daily.   DULoxetine 30 MG capsule Commonly known as: CYMBALTA Take 1 capsule (30 mg total) by mouth daily.   ibuprofen 200 MG tablet Commonly known as: ADVIL Take 200 mg by  mouth every 6 (six) hours as needed for headache.   NON FORMULARY Cough medicine OTC   predniSONE 10 MG (21) Tbpk tablet Commonly known as: STERAPRED UNI-PAK 21 TAB Take by mouth daily. Per box instruction   traMADol 50 MG tablet Commonly known as: ULTRAM Take 1 tablet (50 mg total) by mouth 2 (two) times daily as needed.       Allergies: No Known Allergies  Past Medical History, Surgical history, Social history, and Family History were reviewed and updated.  Review of Systems: All other 10 point review of systems is negative.   Physical Exam:  vitals were not taken for this visit.   Wt Readings from Last 3 Encounters:  06/20/20 168 lb 0.6 oz (76.2 kg)  06/10/20 167 lb (75.8 kg)  06/03/20 169 lb (76.7 kg)    Ocular: Sclerae unicteric, pupils equal, round and reactive to light Ear-nose-throat: Oropharynx clear, dentition fair Lymphatic: No cervical or supraclavicular adenopathy Lungs no rales or rhonchi, good excursion bilaterally Heart regular rate and rhythm, no murmur appreciated Abd soft, nontender, positive bowel sounds MSK no focal spinal tenderness, no joint edema Neuro: non-focal, well-oriented, appropriate affect Breasts: Deferred   Lab Results  Component Value Date   WBC 9.1 09/20/2020   HGB 16.7 (H) 09/20/2020   HCT 49.0 (H) 09/20/2020   MCV 91.6 09/20/2020   PLT 286 09/20/2020   Lab Results  Component Value Date   FERRITIN 408 (H) 06/20/2020   IRON 202 (H) 06/20/2020   TIBC 298 06/20/2020   UIBC 95 (L) 06/20/2020   IRONPCTSAT 68 (H) 06/20/2020   Lab Results  Component Value Date   RETICCTPCT 1.7 08/28/2019   RBC 5.35 (H) 09/20/2020   No results found for: KPAFRELGTCHN, LAMBDASER, KAPLAMBRATIO No results found for: IGGSERUM, IGA, IGMSERUM No results found for: Odetta Pink, SPEI   Chemistry      Component Value Date/Time   NA 137 06/20/2020 1113   NA 140 09/13/2012 0916   K 3.6  06/20/2020 1113   K 3.8 09/13/2012 0916   CL 101 06/20/2020 1113   CL 104 09/13/2012 0916   CO2 26 06/20/2020 1113   CO2 28 09/13/2012 0916   BUN 18 06/20/2020 1113   BUN 16.0 09/13/2012 0916   CREATININE 0.68 06/20/2020 1113   CREATININE 0.7 09/13/2012 0916      Component Value Date/Time   CALCIUM 9.8 06/20/2020 1113   CALCIUM 9.5 09/13/2012 0916   ALKPHOS 83 06/20/2020 1113   ALKPHOS 76 09/13/2012 0916   AST 20 06/20/2020 1113   AST 25 09/13/2012 0916   ALT 12 06/20/2020 1113   ALT 25 09/13/2012 0916   BILITOT 0.8 06/20/2020 1113   BILITOT 0.94 09/13/2012 0916       Impression and Plan: Maria [name] is a very pleasant 84yo caucasian female with hemochromatosis, heterozygous for the C282Y mutation. We will phlebotomize her today. Iron studies are pending. Once we have results we will set up her phlebotomy program.    Follow-up in 6 weeks.  She was encouraged to contact our office with any questions or concerns.   [name], NP 11/19/202111:44 AM

## 2020-09-20 NOTE — Patient Instructions (Signed)

## 2020-09-21 ENCOUNTER — Other Ambulatory Visit: Payer: Self-pay | Admitting: Family Medicine

## 2020-09-23 ENCOUNTER — Telehealth: Payer: Self-pay

## 2020-09-23 LAB — IRON AND TIBC
Iron: 232 ug/dL — ABNORMAL HIGH (ref 41–142)
Saturation Ratios: 75 % — ABNORMAL HIGH (ref 21–57)
TIBC: 310 ug/dL (ref 236–444)
UIBC: 78 ug/dL — ABNORMAL LOW (ref 120–384)

## 2020-09-23 LAB — FERRITIN: Ferritin: 436 ng/mL — ABNORMAL HIGH (ref 11–307)

## 2020-09-23 NOTE — Telephone Encounter (Signed)
Called pt back as she left a vm to r/s the 11/23 as she feels this is too close due to having one on 11/19.  She will be out of town week of 12/13 so the 3rd one will be 12/21.    AOM

## 2020-09-23 NOTE — Telephone Encounter (Signed)
Called pt per inbasket and she is aware of her added phlebot appts    AOM

## 2020-10-01 ENCOUNTER — Other Ambulatory Visit: Payer: Self-pay

## 2020-10-01 ENCOUNTER — Inpatient Hospital Stay: Payer: Medicare Other

## 2020-10-01 NOTE — Patient Instructions (Signed)

## 2020-10-01 NOTE — Progress Notes (Signed)
Maria [name] presents today for phlebotomy per MD orders.  501 grams removed via 16 gauge needle to right AC.  Patient observed for 30 minutes after procedure without any incident. Patient tolerated procedure well and received replacement fluids after procedure.  Pt discharged in no apparent distress. Pt left ambulatory without assistance. Pt aware of discharge instructions and verbalized understanding and had no further questions.

## 2020-10-04 ENCOUNTER — Other Ambulatory Visit (HOSPITAL_BASED_OUTPATIENT_CLINIC_OR_DEPARTMENT_OTHER): Payer: Self-pay | Admitting: Internal Medicine

## 2020-10-04 ENCOUNTER — Ambulatory Visit: Payer: Medicare Other | Attending: Internal Medicine

## 2020-10-04 DIAGNOSIS — Z23 Encounter for immunization: Secondary | ICD-10-CM

## 2020-10-04 NOTE — Progress Notes (Signed)
   Covid-19 Vaccination Clinic  Name:  Maria [name]    MRN: [id number] DOB: 09-18-1936  10/04/2020  [name] was observed post Covid-19 immunization for 15 minutes without incident. She was provided with Vaccine Information Sheet and instruction to access the V-Safe system.   [name] was instructed to call 911 with any severe reactions post vaccine: Marland Kitchen Difficulty breathing  . Swelling of face and throat  . A fast heartbeat  . A bad rash all over body  . Dizziness and weakness   Immunizations Administered    No immunizations on file.

## 2020-10-08 ENCOUNTER — Other Ambulatory Visit: Payer: Self-pay

## 2020-10-08 ENCOUNTER — Inpatient Hospital Stay: Payer: Medicare Other | Attending: Hematology & Oncology

## 2020-10-08 MED FILL — MODERNA COVID-19 VACCINE 10: 100 | 1 days supply | Qty: 0 | Fill #0

## 2020-10-08 NOTE — Progress Notes (Signed)
One unit phlebotomy to right [facility] with a 16G needle over 15 minutes. Patient tolerated well. Nourishments provided. Patient stayed 30 minutes to be observed.  Pt discharged in no apparent distress. Pt left ambulatory without assistance. Pt aware of discharge instructions and verbalized understanding and had no further questions.

## 2020-10-08 NOTE — Patient Instructions (Signed)

## 2020-10-09 ENCOUNTER — Ambulatory Visit: Payer: Medicare Other | Admitting: Physician Assistant

## 2020-10-09 ENCOUNTER — Encounter: Payer: Self-pay | Admitting: Physician Assistant

## 2020-10-09 VITALS — BP 110/78 | HR 80 | Temp 98.3°F | Resp 14 | Ht 65.0 in | Wt 173.0 lb

## 2020-10-09 DIAGNOSIS — M810 Age-related osteoporosis without current pathological fracture: Secondary | ICD-10-CM | POA: Diagnosis not present

## 2020-10-09 DIAGNOSIS — I10 Essential (primary) hypertension: Secondary | ICD-10-CM | POA: Diagnosis not present

## 2020-10-09 MED ORDER — DULOXETINE HCL 30 MG PO CPEP: 30.0000 mg | ORAL_CAPSULE | Freq: Every day | ORAL | 1 refills | Status: DC

## 2020-10-09 NOTE — Progress Notes (Signed)
Patient presents to clinic today as a transfer of care from Dr. Charlett Blake at our [facility] location. Patient with a history of hereditary hemochromatosis, followed by Hematology with last phlebotomy yesterday AM. Has followed up with the specialist scheduled in 2 weeks. Also with history of hypertension, currently on a regimen of amlodipine 7.5 mg daily. Endorses taking medications as directed and tolerating well. Patient denies chest pain, palpitations, lightheadedness, dizziness, vision changes or frequent headaches.  BP Readings from Last 3 Encounters:  10/09/20 110/78  10/08/20 131/84  10/01/20 117/80   Patient also with a history of obstructive sleep apnea for which she uses CPAP nightly with good results.  Also endorses history of osteoporosis with last bone density in June of this year.  States she was never called with her results and would like to know if I can locate these and discussed with her.  States she does take a calcium and vitamin D supplement.  Does follow resistance training.  Previously recommended to be started on Fosamax but refused.  States she does not want take a prescription medication for this.  Past Medical History:  Diagnosis Date  . Cataract   . Complication of anesthesia    slow to awaken  . Depression   . Diverticulosis   . Goiter, nodular    [place] of thyroid stable 7/08  . Headache    hx of  . Heart murmur    due to rheumatic fever as child  . Hemochromatosis, hereditary ([place]) 09/01/2019  . Mitral valve prolapse   . Multiple thyroid nodules 11/24/2015  . Neoplasm of lung, malignant ([place]) 2002   Non-small cell surgical tx  . Osteoarthritis 11/14/2013   2010 intra-articular steroids X 3 S/P Physical Therapy in 12/14 TKR 12/11/13   . Osteopenia    Dr Pamala Hurry  . PONV (postoperative nausea and vomiting)    n/v  . Sleep apnea   . Vitamin D deficiency     Current Outpatient Medications on File Prior to Visit  Medication Sig Dispense Refill  . acetaminophen  (TYLENOL) 500 MG tablet Take 500 mg by mouth every 6 (six) hours as needed.    Marland Kitchen amLODipine (NORVASC) 5 MG tablet Take 1.5 tablets (7.5 mg total) by mouth daily. NEEDS OV 135 tablet 0  . DULoxetine (CYMBALTA) 30 MG capsule Take 1 capsule (30 mg total) by mouth daily. 90 capsule 0  . ibuprofen (ADVIL) 200 MG tablet Take 200 mg by mouth every 6 (six) hours as needed for headache.     No current facility-administered medications on file prior to visit.    No Known Allergies  Family History  Problem Relation Age of Onset  . COPD Mother   . Coronary artery disease Father   . Colon cancer Father 34  . Heart attack Father 82  . Stroke Father        > 97  . Lung cancer Paternal Aunt        smoker  . Cancer Paternal Aunt        lung  . Lung cancer Maternal Grandfather        smoker  . COPD Maternal Grandfather   . Arthritis Sister   . Lung cancer Maternal Uncle        smoker  . Diabetes Neg Hx     Social History   Socioeconomic History  . Marital status: Divorced    Spouse name: Not on file  . Number of children: Not on file  . Years  of education: college  . Highest education level: Not on file  Occupational History  . Occupation: Retired    Fish farm manager: RETIRED  Tobacco Use  . Smoking status: Former Smoker    Years: 20.00    Types: Cigarettes    Quit date: 05/03/1979    Years since quitting: 41.4  . Smokeless tobacco: Never Used  . Tobacco comment: smoked 1962-1980, up to < 1 ppd  Vaping Use  . Vaping Use: Never used  Substance and Sexual Activity  . Alcohol use: Yes    Alcohol/week: 7.0 standard drinks    Types: 7 Standard drinks or equivalent per week    Comment: occasional  . Drug use: No  . Sexual activity: Not Currently    Comment: lives alone, dog, no dietary restrictions, eating heart healthy  Other Topics Concern  . Not on file  Social History Narrative   Pt gets regular exercise.   Drinks 2 cups of coffee a day    Social Determinants of Health    Financial Resource Strain: Low Risk   . Difficulty of Paying Living Expenses: Not hard at all  Food Insecurity: No Food Insecurity  . Worried About Charity fundraiser in the Last Year: Never true  . Ran Out of Food in the Last Year: Never true  Transportation Needs: No Transportation Needs  . Lack of Transportation (Medical): No  . Lack of Transportation (Non-Medical): No  Physical Activity:   . Days of Exercise per Week: Not on file  . Minutes of Exercise per Session: Not on file  Stress:   . Feeling of Stress : Not on file  Social Connections:   . Frequency of Communication with Friends and Family: Not on file  . Frequency of Social Gatherings with Friends and Family: Not on file  . Attends Religious Services: Not on file  . Active Member of Clubs or Organizations: Not on file  . Attends Archivist Meetings: Not on file  . Marital Status: Not on file    Review of Systems - See HPI.  All other ROS are negative.  BP 110/78   Pulse 80   Temp 98.3 F (36.8 C) (Temporal)   Resp 14   Ht 5\' 5"  (1.651 m)   Wt 173 lb (78.5 kg)   SpO2 99%   BMI 28.79 kg/m   Physical Exam Vitals reviewed.  Constitutional:      Appearance: Normal appearance.  HENT:     Head: Normocephalic and atraumatic.  Cardiovascular:     Rate and Rhythm: Normal rate and regular rhythm.     Pulses: Normal pulses.     Heart sounds: Normal heart sounds.  Pulmonary:     Effort: Pulmonary effort is normal.     Breath sounds: Normal breath sounds.  Musculoskeletal:     Cervical back: Neck supple.  Neurological:     General: No focal deficit present.     Mental Status: She is alert and oriented to person, place, and time.     Recent Results (from the past 2160 hour(s))  SARS CORONAVIRUS 2 (TAT 6-24 HRS) Nasopharyngeal Nasopharyngeal Swab     Status: None   Collection Time: 07/21/20  4:27 PM   Specimen: Nasopharyngeal Swab  Result Value Ref Range   SARS Coronavirus 2 NEGATIVE NEGATIVE     Comment: (NOTE) SARS-CoV-2 target nucleic acids are NOT DETECTED.  The SARS-CoV-2 RNA is generally detectable in upper and lower respiratory specimens during the acute phase of infection.  Negative results do not preclude SARS-CoV-2 infection, do not rule out co-infections with other pathogens, and should not be used as the sole basis for treatment or other patient management decisions. Negative results must be combined with clinical observations, patient history, and epidemiological information. The expected result is Negative.  Fact Sheet for Patients: SugarRoll.be  Fact Sheet for Healthcare Providers: https://www.woods-mathews.com/  This test is not yet approved or cleared by the [place] FDA and  has been authorized for detection and/or diagnosis of SARS-CoV-2 by FDA under an Emergency Use Authorization (EUA). This EUA will remain  in effect (meaning this test can be used) for the duration of the COVID-19 declaration under Se ction 564(b)(1) of the Act, 21 U.S.C. section 360bbb-3(b)(1), unless the authorization is terminated or revoked sooner.  Performed at [facility], [place] [address], [place], [place] [address]   Ferritin     Status: Abnormal   Collection Time: 09/20/20 11:21 AM  Result Value Ref Range   Ferritin 436 (H) 11 - 307 ng/mL    Comment: Performed at [facility], [place] [address], [place], [place] [address]  Iron and TIBC     Status: Abnormal   Collection Time: 09/20/20 11:21 AM  Result Value Ref Range   Iron 232 (H) 41 - 142 ug/dL   TIBC 310 236 - 444 ug/dL   Saturation Ratios 75 (H) 21 - 57 %   UIBC 78 (L) 120 - 384 ug/dL    Comment: Performed at [facility], [place] [address], [place], [place] [address]  CMP ([place] only)     Status: Abnormal   Collection Time: 09/20/20 11:21 AM  Result Value Ref Range   Sodium 137 135 - 145 mmol/L   Potassium 3.9  3.5 - 5.1 mmol/L   Chloride 100 98 - 111 mmol/L   CO2 27 22 - 32 mmol/L   Glucose, Bld 102 (H) 70 - 99 mg/dL    Comment: Glucose reference range applies only to samples taken after fasting for at least 8 hours.   BUN 19 8 - 23 mg/dL   Creatinine 0.70 0.44 - 1.00 mg/dL   Calcium 9.9 8.9 - 10.3 mg/dL   Total Protein 7.4 6.5 - 8.1 g/dL   Albumin 4.5 3.5 - 5.0 g/dL   AST 22 15 - 41 U/L   ALT 11 0 - 44 U/L   Alkaline Phosphatase 99 38 - 126 U/L   Total Bilirubin 0.8 0.3 - 1.2 mg/dL   GFR, Estimated >60 >60 mL/min    Comment: (NOTE) Calculated using the CKD-EPI Creatinine Equation (2021)    Anion gap 10 5 - 15    Comment: Performed at [facility] at [facility], [address] [place], [place] [address]  CBC with Differential ([place] Only)     Status: Abnormal   Collection Time: 09/20/20 11:21 AM  Result Value Ref Range   WBC Count 9.1 4.0 - 10.5 K/uL   RBC 5.35 (H) 3.87 - 5.11 MIL/uL   Hemoglobin 16.7 (H) 12.0 - 15.0 g/dL   HCT 49.0 (H) 36 - 46 %   MCV 91.6 80.0 - 100.0 fL   MCH 31.2 26.0 - 34.0 pg   MCHC 34.1 30.0 - 36.0 g/dL   RDW 12.3 11.5 - 15.5 %   Platelet Count 286 150 - 400 K/uL   nRBC 0.0 0.0 - 0.2 %   Neutrophils Relative % 52 %  Neutro Abs 4.8 1.7 - 7.7 K/uL   Lymphocytes Relative 35 %   Lymphs Abs 3.2 0.7 - 4.0 K/uL   Monocytes Relative 10 %   Monocytes Absolute 0.9 0.1 - 1.0 K/uL   Eosinophils Relative 2 %   Eosinophils Absolute 0.2 0.0 - 0.5 K/uL   Basophils Relative 1 %   Basophils Absolute 0.1 0.0 - 0.1 K/uL   Immature Granulocytes 0 %   Abs Immature Granulocytes 0.03 0.00 - 0.07 K/uL    Comment: Performed at [facility] at [facility], [address], [place], [place] [address]   Assessment/Plan: 1. Hypertension, unspecified type BP normotensive.  Asymptomatic.  Continue current medication regimen.  Refill sent to pharmacy.  2. Osteoporosis, unspecified osteoporosis type, unspecified  pathological fracture presence Reviewed last DEXA results.  T score worsened from -3.0 in 2017 to -3.5.  Discussed options for treatment of osteoporosis but she declines.  She will continue her calcium and vitamin D levels.  Discussed at next checkup we will need to repeat those levels.  Lab Results  Component Value Date   CALCIUM 9.9 09/20/2020   PHOS 2.4 01/17/2010   3. Hereditary hemochromatosis ([place]) Doing well.  Continue management per specialist.  This visit occurred during the SARS-CoV-2 public health emergency.  Safety protocols were in place, including screening questions prior to the visit, additional usage of staff PPE, and extensive cleaning of exam room while observing appropriate contact time as indicated for disinfecting solutions.     Leeanne Rio, PA-C

## 2020-10-09 NOTE — Patient Instructions (Signed)
Please continue chronic medications as directed and follow-up for your phlebotomy sessions.   Keep up with Calcium and Vitamin D.  Please reconsider medication for osteoporosis.   It was very nice meeting you today. Welcome to [facility]!

## 2020-10-22 ENCOUNTER — Other Ambulatory Visit: Payer: Self-pay

## 2020-10-22 ENCOUNTER — Inpatient Hospital Stay: Payer: Medicare Other

## 2020-10-22 NOTE — Progress Notes (Signed)
Maria [name] presents today for phlebotomy per MD orders. Phlebotomy procedure started at [place] and ended at 212p 510 grams removed via 16 gauge needle to left AC.  Patient observed for 30 minutes after procedure without any incident. Patient tolerated procedure well and received replacement fluids after procedure.  Patient understands to call if he has any questions or concerns post discharge.

## 2020-10-22 NOTE — Patient Instructions (Signed)
Therapeutic Phlebotomy Therapeutic phlebotomy is the planned removal of blood from a person's body for the purpose of treating a medical condition. The procedure is similar to donating blood. Usually, about a pint (470 mL, or 0.47 L) of blood is removed. The average adult has 9-12 pints (4.3-5.7 L) of blood in the body. Therapeutic phlebotomy may be used to treat the following medical conditions:  Hemochromatosis. This is a condition in which the blood contains too much iron.  Polycythemia vera. This is a condition in which the blood contains too many red blood cells.  Porphyria cutanea tarda. This is a disease in which an important part of hemoglobin is not made properly. It results in the buildup of abnormal amounts of porphyrins in the body.  Sickle cell disease. This is a condition in which the red blood cells form an abnormal crescent shape rather than a round shape. Tell a health care provider about:  Any allergies you have.  All medicines you are taking, including vitamins, herbs, eye drops, creams, and over-the-counter medicines.  Any problems you or family members have had with anesthetic medicines.  Any blood disorders you have.  Any surgeries you have had.  Any medical conditions you have.  Whether you are pregnant or may be pregnant. What are the risks? Generally, this is a safe procedure. However, problems may occur, including:  Nausea or light-headedness.  Low blood pressure (hypotension).  Soreness, bleeding, swelling, or bruising at the needle insertion site.  Infection. What happens before the procedure?  Follow instructions from your health care provider about eating or drinking restrictions.  Ask your health care provider about: ? Changing or stopping your regular medicines. This is especially important if you are taking diabetes medicines or blood thinners (anticoagulants). ? Taking medicines such as aspirin and ibuprofen. These medicines can thin your  blood. Do not take these medicines unless your health care provider tells you to take them. ? Taking over-the-counter medicines, vitamins, herbs, and supplements.  Wear clothing with sleeves that can be raised above the elbow.  Plan to have someone take you home from the hospital or clinic.  You may have a blood sample taken.  Your blood pressure, pulse rate, and breathing rate will be measured. What happens during the procedure?   To lower your risk of infection: ? Your health care team will wash or sanitize their hands. ? Your skin will be cleaned with an antiseptic.  You may be given a medicine to numb the area (local anesthetic).  A tourniquet will be placed on your arm.  A needle will be inserted into one of your veins.  Tubing and a collection bag will be attached to that needle.  Blood will flow through the needle and tubing into the collection bag.  The collection bag will be placed lower than your arm to allow gravity to help the flow of blood into the bag.  You may be asked to open and close your hand slowly and continually during the entire collection.  After the specified amount of blood has been removed from your body, the collection bag and tubing will be clamped.  The needle will be removed from your vein.  Pressure will be held on the site of the needle insertion to stop the bleeding.  A bandage (dressing) will be placed over the needle insertion site. The procedure may vary among health care providers and hospitals. What happens after the procedure?  Your blood pressure, pulse rate, and breathing rate will be   measured after the procedure.  You will be encouraged to drink fluids.  Your recovery will be assessed and monitored.  You can return to your normal activities as told by your health care provider. Summary  Therapeutic phlebotomy is the planned removal of blood from a person's body for the purpose of treating a medical condition.  Therapeutic  phlebotomy may be used to treat hemochromatosis, polycythemia vera, porphyria cutanea tarda, or sickle cell disease.  In the procedure, a needle is inserted and about a pint (470 mL, or 0.47 L) of blood is removed. The average adult has 9-12 pints (4.3-5.7 L) of blood in the body.  This is generally a safe procedure, but it can sometimes cause problems such as nausea, light-headedness, or low blood pressure (hypotension). This information is not intended to replace advice given to you by your health care provider. Make sure you discuss any questions you have with your health care provider. Document Revised: 11/04/2017 Document Reviewed: 11/04/2017 Elsevier Patient Education  2020 [employer] Inc[employer]  

## 2020-10-29 ENCOUNTER — Inpatient Hospital Stay: Payer: Medicare Other

## 2020-10-29 ENCOUNTER — Encounter: Payer: Self-pay | Admitting: Family

## 2020-10-29 ENCOUNTER — Inpatient Hospital Stay (HOSPITAL_BASED_OUTPATIENT_CLINIC_OR_DEPARTMENT_OTHER): Payer: Medicare Other | Admitting: Family

## 2020-10-29 ENCOUNTER — Other Ambulatory Visit: Payer: Self-pay

## 2020-10-29 LAB — CMP (CANCER CENTER ONLY)
ALT: 10 U/L (ref 0–44)
AST: 19 U/L (ref 15–41)
Albumin: 4.2 g/dL (ref 3.5–5.0)
Alkaline Phosphatase: 82 U/L (ref 38–126)
Anion gap: 7 (ref 5–15)
BUN: 20 mg/dL (ref 8–23)
CO2: 30 mmol/L (ref 22–32)
Calcium: 9.7 mg/dL (ref 8.9–10.3)
Chloride: 99 mmol/L (ref 98–111)
Creatinine: 0.84 mg/dL (ref 0.44–1.00)
GFR, Estimated: >60 mL/min (ref >60)
Glucose, Bld: 93 mg/dL (ref 70–99)
Potassium: 4 mmol/L (ref 3.5–5.1)
Sodium: 136 mmol/L (ref 135–145)
Total Bilirubin: 0.8 mg/dL (ref 0.3–1.2)
Total Protein: 6.7 g/dL (ref 6.5–8.1)

## 2020-10-29 LAB — CBC WITH DIFFERENTIAL (CANCER CENTER ONLY)
Abs Immature Granulocytes: 0.02 10*3/uL (ref 0.00–0.07)
Basophils Absolute: 0 10*3/uL (ref 0.0–0.1)
Basophils Relative: 1 %
Eosinophils Absolute: 0.2 10*3/uL (ref 0.0–0.5)
Eosinophils Relative: 2 %
HCT: 41.7 % (ref 36.0–46.0)
Hemoglobin: 13.8 g/dL (ref 12.0–15.0)
Immature Granulocytes: 0 %
Lymphocytes Relative: 38 %
Lymphs Abs: 2.9 10*3/uL (ref 0.7–4.0)
MCH: 30.9 pg (ref 26.0–34.0)
MCHC: 33.1 g/dL (ref 30.0–36.0)
MCV: 93.3 fL (ref 80.0–100.0)
Monocytes Absolute: 0.9 10*3/uL (ref 0.1–1.0)
Monocytes Relative: 12 %
Neutro Abs: 3.7 10*3/uL (ref 1.7–7.7)
Neutrophils Relative %: 47 %
Platelet Count: 293 10*3/uL (ref 150–400)
RBC: 4.47 MIL/uL (ref 3.87–5.11)
RDW: 13.3 % (ref 11.5–15.5)
WBC Count: 7.6 10*3/uL (ref 4.0–10.5)
nRBC: 0 % (ref 0.0–0.2)

## 2020-10-29 NOTE — Progress Notes (Signed)
Hematology and Oncology Follow Up Visit  Maria [name] [id number] 1936-03-26 84 y.o. 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 Maria [name] is doing fairly well but notes fatigue over the last week or so.  Maria [name] has had a weekly phlebotomy for the last 4 weeks. Iron studies are pending.  Hgb is improved at 13.8, WBC count 7.6 and platelets 293.  No other blood loss noted. No abnormal bruising, no petechiae.  No fever, chills, n/v, cough, rash, dizziness, SOB, chest pain, palpitations, abdominal pain or changes in bowel or bladder habits.  No swelling, tenderness, numbness or tingling in Maria [name] extremities.  No falls or syncope.  Maria [name] has a good appetite and is staying well hydrated. Maria [name] weight is stable.   ECOG Performance Status: 1 - Symptomatic but completely ambulatory  Medications:  Allergies as of 10/29/2020   No Known Allergies     Medication List       Accurate as of October 29, 2020 11:51 AM. If you have any questions, ask your nurse or doctor.        acetaminophen 500 MG tablet Commonly known as: TYLENOL Take 500 mg by mouth every 6 (six) hours as needed.   amLODipine 5 MG tablet Commonly known as: NORVASC Take 1.5 tablets (7.5 mg total) by mouth daily. NEEDS OV   DULoxetine 30 MG capsule Commonly known as: CYMBALTA Take 1 capsule (30 mg total) by mouth daily.   ibuprofen 200 MG tablet Commonly known as: ADVIL Take 200 mg by mouth every 6 (six) hours as needed for headache.       Allergies: No Known Allergies  Past Medical History, Surgical history, Social history, and Family History were reviewed and updated.  Review of Systems: All other 10 point review of systems is negative.   Physical Exam:  height is 5\' 5"  (1.651 m) and weight is 172 lb 1.3 oz (78.1  kg). Maria [name] oral temperature is 97.8 F (36.6 C). Maria [name] blood pressure is 111/74 and Maria [name] pulse is 83. Maria [name] respiration is 18 and oxygen saturation is 100%.   Wt Readings from Last 3 Encounters:  10/29/20 172 lb 1.3 oz (78.1 kg)  10/09/20 173 lb (78.5 kg)  09/20/20 172 lb (78 kg)    Ocular: Sclerae unicteric, pupils equal, round and reactive to light Ear-nose-throat: Oropharynx clear, dentition fair Lymphatic: No cervical or supraclavicular adenopathy Lungs no rales or rhonchi, good excursion bilaterally Heart regular rate and rhythm, no murmur appreciated Abd soft, nontender, positive bowel sounds MSK no focal spinal tenderness, no joint edema Neuro: non-focal, well-oriented, appropriate affect Breasts: Deferred   Lab Results  Component Value Date   WBC 7.6 10/29/2020   HGB 13.8 10/29/2020   HCT 41.7 10/29/2020   MCV 93.3 10/29/2020   PLT 293 10/29/2020   Lab Results  Component Value Date   FERRITIN 436 (H) 09/20/2020   IRON 232 (H) 09/20/2020   TIBC 310 09/20/2020   UIBC 78 (L) 09/20/2020   IRONPCTSAT 75 (H) 09/20/2020   Lab Results  Component Value Date   RETICCTPCT 1.7 08/28/2019   RBC 4.47 10/29/2020   No results found for: KPAFRELGTCHN, LAMBDASER, KAPLAMBRATIO No results found for: IGGSERUM, IGA, IGMSERUM No results found for: TOTALPROTELP, ALBUMINELP, A1GS, A2GS, BETS, BETA2SER, GAMS, MSPIKE, SPEI   Chemistry      Component Value  Date/Time   NA 136 10/29/2020 1100   NA 140 09/13/2012 0916   K 4.0 10/29/2020 1100   K 3.8 09/13/2012 0916   CL 99 10/29/2020 1100   CL 104 09/13/2012 0916   CO2 30 10/29/2020 1100   CO2 28 09/13/2012 0916   BUN 20 10/29/2020 1100   BUN 16.0 09/13/2012 0916   CREATININE 0.84 10/29/2020 1100   CREATININE 0.7 09/13/2012 0916      Component Value Date/Time   CALCIUM 9.7 10/29/2020 1100   CALCIUM 9.5 09/13/2012 0916   ALKPHOS 82 10/29/2020 1100   ALKPHOS 76 09/13/2012 0916   AST 19 10/29/2020 1100   AST 25 09/13/2012 0916   ALT  10 10/29/2020 1100   ALT 25 09/13/2012 0916   BILITOT 0.8 10/29/2020 1100   BILITOT 0.94 09/13/2012 0916       Impression and Plan: Maria [name] is a very pleasant 84yo caucasian female with hemochromatosis, heterozygous for the C282Y mutation. Maria [name] is noting some fatigue which Maria [name] states is "annoying" as Maria [name] is typically a go getter and has lots of energy.  Iron studies are pending.  No phlebotomy today we will wait for Maria [name] lab work to come back.  Follow-up in 6 weeks with lab only in 3 weeks.  Maria [name] can contact our office with any questions or concerns. We can certainly see Maria [name] sooner if needed.   [name], NP 12/28/202111:51 AM

## 2020-10-30 ENCOUNTER — Telehealth: Payer: Self-pay

## 2020-10-30 ENCOUNTER — Telehealth: Payer: Self-pay | Admitting: Family

## 2020-10-30 LAB — IRON AND TIBC
Iron: 176 ug/dL — ABNORMAL HIGH (ref 41–142)
Saturation Ratios: 58 % — ABNORMAL HIGH (ref 21–57)
TIBC: 305 ug/dL (ref 236–444)
UIBC: 129 ug/dL (ref 120–384)

## 2020-10-30 LAB — FERRITIN: Ferritin: 283 ng/mL (ref 11–307)

## 2020-10-30 NOTE — Telephone Encounter (Signed)
No 10/29/20 los entered....aom

## 2020-10-30 NOTE — Telephone Encounter (Signed)
Called and LMVM for patient regarding appointments scheduled per 12/29 sch msg

## 2020-11-14 ENCOUNTER — Telehealth: Payer: Medicare Other | Admitting: Physician Assistant

## 2020-11-20 ENCOUNTER — Other Ambulatory Visit: Payer: Self-pay

## 2020-11-20 ENCOUNTER — Inpatient Hospital Stay: Payer: Medicare Other | Attending: Hematology & Oncology

## 2020-11-20 LAB — CMP (CANCER CENTER ONLY)
ALT: 7 U/L (ref 0–44)
AST: 17 U/L (ref 15–41)
Albumin: 4.1 g/dL (ref 3.5–5.0)
Alkaline Phosphatase: 90 U/L (ref 38–126)
Anion gap: 8 (ref 5–15)
BUN: 16 mg/dL (ref 8–23)
CO2: 27 mmol/L (ref 22–32)
Calcium: 9.4 mg/dL (ref 8.9–10.3)
Chloride: 100 mmol/L (ref 98–111)
Creatinine: 0.73 mg/dL (ref 0.44–1.00)
GFR, Estimated: >60 mL/min (ref >60)
Glucose, Bld: 124 mg/dL — ABNORMAL HIGH (ref 70–99)
Potassium: 3.7 mmol/L (ref 3.5–5.1)
Sodium: 135 mmol/L (ref 135–145)
Total Bilirubin: 0.7 mg/dL (ref 0.3–1.2)
Total Protein: 6.7 g/dL (ref 6.5–8.1)

## 2020-11-20 LAB — CBC WITH DIFFERENTIAL (CANCER CENTER ONLY)
Abs Immature Granulocytes: 0.01 10*3/uL (ref 0.00–0.07)
Basophils Absolute: 0.1 10*3/uL (ref 0.0–0.1)
Basophils Relative: 1 %
Eosinophils Absolute: 0.1 10*3/uL (ref 0.0–0.5)
Eosinophils Relative: 2 %
HCT: 43.2 % (ref 36.0–46.0)
Hemoglobin: 14.7 g/dL (ref 12.0–15.0)
Immature Granulocytes: 0 %
Lymphocytes Relative: 37 %
Lymphs Abs: 2.6 10*3/uL (ref 0.7–4.0)
MCH: 31.5 pg (ref 26.0–34.0)
MCHC: 34 g/dL (ref 30.0–36.0)
MCV: 92.5 fL (ref 80.0–100.0)
Monocytes Absolute: 0.7 10*3/uL (ref 0.1–1.0)
Monocytes Relative: 10 %
Neutro Abs: 3.4 10*3/uL (ref 1.7–7.7)
Neutrophils Relative %: 50 %
Platelet Count: 266 10*3/uL (ref 150–400)
RBC: 4.67 MIL/uL (ref 3.87–5.11)
RDW: 12.5 % (ref 11.5–15.5)
WBC Count: 6.9 10*3/uL (ref 4.0–10.5)
nRBC: 0 % (ref 0.0–0.2)

## 2020-11-21 ENCOUNTER — Telehealth: Payer: Self-pay

## 2020-11-21 ENCOUNTER — Encounter: Payer: Self-pay | Admitting: Physician Assistant

## 2020-11-21 ENCOUNTER — Ambulatory Visit: Payer: Medicare Other | Admitting: Physician Assistant

## 2020-11-21 DIAGNOSIS — R5382 Chronic fatigue, unspecified: Secondary | ICD-10-CM

## 2020-11-21 DIAGNOSIS — H8113 Benign paroxysmal vertigo, bilateral: Secondary | ICD-10-CM

## 2020-11-21 DIAGNOSIS — M2540 Effusion, unspecified joint: Secondary | ICD-10-CM | POA: Diagnosis not present

## 2020-11-21 LAB — IRON AND TIBC
Iron: 166 ug/dL — ABNORMAL HIGH (ref 41–142)
Saturation Ratios: 54 % (ref 21–57)
TIBC: 308 ug/dL (ref 236–444)
UIBC: 141 ug/dL (ref 120–384)

## 2020-11-21 LAB — TSH: TSH: 3.64 u[IU]/mL (ref 0.35–4.50)

## 2020-11-21 LAB — B12 AND FOLATE PANEL
Folate: >23.6 ng/mL (ref >5.9)
Vitamin B-12: 886 pg/mL (ref 211–911)

## 2020-11-21 LAB — FERRITIN: Ferritin: 269 ng/mL (ref 11–307)

## 2020-11-21 LAB — VITAMIN D 25 HYDROXY (VIT D DEFICIENCY, FRACTURES): VITD: 42.81 ng/mL (ref 30.00–100.00)

## 2020-11-21 NOTE — Patient Instructions (Signed)
Please go to the lab today for blood work.  I will call you with your results. We will alter treatment regimen(s) if indicated by your results.   Please keep well-hydrated and get plenty of rest.   You will be contacted by new Hematologist at [facility].  You will also be contacted by ENT for episodic positional vertigo.  Findings today also reveal you are getting hypotensive with certain position changes so this is causing the occasional lightheadedness. I am decreasing your amlodipine from 7.5 mg daily to 5 mg daily.   Let me know over the next 7-10 days how you are feeling.

## 2020-11-21 NOTE — Telephone Encounter (Signed)
Called pt per sch message that she need a phlebot next week and then the following week, pt states that she is having joint pain and req to c.b the first of the week to sch, sarah is aware   Maria [name]

## 2020-11-21 NOTE — Progress Notes (Signed)
Patient presents to clinic today to discuss multiple concerns.  Patient with history of hemochromatosis, currently followed by hematology at the [facility] in [facility].  Notes she wants to switch to a provider at [facility] as this location is 5 minutes from her home.  Is wondering if we can help assist her with this.  Patient also notes a history of vertigo about 10 years ago, needing assessment and treatment by ENT.  States recently when doing yoga she had a few episodes on separate occasions of mild vertigo.  Notes she was very dizzy and unbalanced but episode was short-lived.  Did note episode of emesis with 1 of these spells.  Will note some occasional lightheadedness with positional change.  Denies any chest pain, racing heart, shortness of breath, vision changes or confusion.  Denies frequent headache.  Asymptomatic at present.  Patient has noted some increased levels of fatigue over the past several months associated with swelling of the joints of her hands bilaterally and associated pain.  Notes history of osteoarthritis of multiple sites of her body including her hands, but states this feels different.  Denies fever, chills or night sweats.  Denies unexplainable changes in weight.  Denies change to bowel or bladder habits.  Past Medical History:  Diagnosis Date  . Cataract   . Complication of anesthesia    slow to awaken  . Depression   . Diverticulosis   . Goiter, nodular    [place] of thyroid stable 7/08  . Headache    hx of  . Heart murmur    due to rheumatic fever as child  . Hemochromatosis, hereditary ([place]) 09/01/2019  . Mitral valve prolapse   . Multiple thyroid nodules 11/24/2015  . Neoplasm of lung, malignant ([place]) 2002   Non-small cell surgical tx  . Osteoarthritis 11/14/2013   2010 intra-articular steroids X 3 S/P Physical Therapy in 12/14 TKR 12/11/13   . Osteopenia    Dr Pamala Hurry  . PONV (postoperative nausea and vomiting)    n/v  .  Sleep apnea   . Vitamin D deficiency     Current Outpatient Medications on File Prior to Visit  Medication Sig Dispense Refill  . acetaminophen (TYLENOL) 500 MG tablet Take 500 mg by mouth every 6 (six) hours as needed.    Marland Kitchen amLODipine (NORVASC) 5 MG tablet Take 1.5 tablets (7.5 mg total) by mouth daily. NEEDS OV 135 tablet 0  . DULoxetine (CYMBALTA) 30 MG capsule Take 1 capsule (30 mg total) by mouth daily. 90 capsule 1  . ibuprofen (ADVIL) 200 MG tablet Take 200 mg by mouth every 6 (six) hours as needed for headache.     No current facility-administered medications on file prior to visit.    No Known Allergies  Family History  Problem Relation Age of Onset  . COPD Mother   . Coronary artery disease Father   . Colon cancer Father 7  . Heart attack Father 75  . Stroke Father        > 35  . Lung cancer Paternal Aunt        smoker  . Cancer Paternal Aunt        lung  . Lung cancer Maternal Grandfather        smoker  . COPD Maternal Grandfather   . Arthritis Sister   . Lung cancer Maternal Uncle        smoker  . Diabetes Neg Hx  Social History   Socioeconomic History  . Marital status: Divorced    Spouse name: Not on file  . Number of children: Not on file  . Years of education: college  . Highest education level: Not on file  Occupational History  . Occupation: Retired    Fish farm manager: RETIRED  Tobacco Use  . Smoking status: Former Smoker    Years: 20.00    Types: Cigarettes    Quit date: 05/03/1979    Years since quitting: 41.5  . Smokeless tobacco: Never Used  . Tobacco comment: smoked 1962-1980, up to < 1 ppd  Vaping Use  . Vaping Use: Never used  Substance and Sexual Activity  . Alcohol use: Yes    Alcohol/week: 7.0 standard drinks    Types: 7 Standard drinks or equivalent per week    Comment: occasional  . Drug use: No  . Sexual activity: Not Currently    Comment: lives alone, dog, no dietary restrictions, eating heart healthy  Other Topics Concern   . Not on file  Social History Narrative   Pt gets regular exercise.   Drinks 2 cups of coffee a day    Social Determinants of Health   Financial Resource Strain: Low Risk   . Difficulty of Paying Living Expenses: Not hard at all  Food Insecurity: No Food Insecurity  . Worried About Charity fundraiser in the Last Year: Never true  . Ran Out of Food in the Last Year: Never true  Transportation Needs: No Transportation Needs  . Lack of Transportation (Medical): No  . Lack of Transportation (Non-Medical): No  Physical Activity: Not on file  Stress: Not on file  Social Connections: Not on file   Review of Systems - See HPI.  All other ROS are negative.  BP 112/72   Pulse 85   Temp 98 F (36.7 C) (Temporal)   Resp 16   Ht $R'5\' 5"'aw$  (1.651 m)   Wt 172 lb (78 kg)   SpO2 98%   BMI 28.62 kg/m   Physical Exam Vitals reviewed.  Constitutional:      Appearance: Normal appearance.  HENT:     Head: Normocephalic and atraumatic.     Comments: Negative Dix-Hallpike maneuvers    Right Ear: Tympanic membrane, ear canal and external ear normal. There is no impacted cerumen.     Left Ear: Tympanic membrane, ear canal and external ear normal. There is no impacted cerumen.     Nose: Nose normal.     Mouth/Throat:     Mouth: Mucous membranes are moist.  Eyes:     Conjunctiva/sclera: Conjunctivae normal.     Pupils: Pupils are equal, round, and reactive to light.  Cardiovascular:     Rate and Rhythm: Normal rate and regular rhythm.     Pulses: Normal pulses.     Heart sounds: Normal heart sounds.  Pulmonary:     Effort: Pulmonary effort is normal.     Breath sounds: Normal breath sounds.  Musculoskeletal:     Right wrist: Normal.     Left wrist: Normal.     Right hand: Swelling and deformity present. No bony tenderness. Normal range of motion. Normal strength. Normal sensation. Normal capillary refill.     Left hand: Swelling and deformity present. No bony tenderness. Normal range of  motion. Normal strength. Normal sensation. Normal capillary refill.     Cervical back: Neck supple.     Comments: Enlargement of several PIP and DIP joints of hands bilaterally.  Evidence of OA noted but also note some mild swelling of joints and slight ulnar deviation bilaterally.  Neurological:     General: No focal deficit present.     Mental Status: She is alert and oriented to person, place, and time.  Psychiatric:        Mood and Affect: Mood normal.        Behavior: Behavior normal.     Recent Results (from the past 2160 hour(s))  Ferritin     Status: Abnormal   Collection Time: 09/20/20 11:21 AM  Result Value Ref Range   Ferritin 436 (H) 11 - 307 ng/mL    Comment: Performed at [facility], [address] [place], [place] [address]  Iron and TIBC     Status: Abnormal   Collection Time: 09/20/20 11:21 AM  Result Value Ref Range   Iron 232 (H) 41 - 142 ug/dL   TIBC 310 236 - 444 ug/dL   Saturation Ratios 75 (H) 21 - 57 %   UIBC 78 (L) 120 - 384 ug/dL    Comment: Performed at  [facility], [place] [address] [place], [place] [address]  CMP ([place] only)     Status: Abnormal   Collection Time: 09/20/20 11:21 AM  Result Value Ref Range   Sodium 137 135 - 145 mmol/L   Potassium 3.9 3.5 - 5.1 mmol/L   Chloride 100 98 - 111 mmol/L   CO2 27 22 - 32 mmol/L   Glucose, Bld 102 (H) 70 - 99 mg/dL    Comment: Glucose reference range applies only to samples taken after fasting for at least 8 hours.   BUN 19 8 - 23 mg/dL   Creatinine 0.70 0.44 - 1.00 mg/dL   Calcium 9.9 8.9 - 10.3 mg/dL   Total Protein 7.4 6.5 - 8.1 g/dL   Albumin 4.5 3.5 - 5.0 g/dL   AST 22 15 - 41 U/L   ALT 11 0 - 44 U/L   Alkaline Phosphatase 99 38 - 126 U/L   Total Bilirubin 0.8 0.3 - 1.2 mg/dL   GFR, Estimated >60 >60 mL/min    Comment: (NOTE) Calculated using the CKD-EPI Creatinine Equation (2021)    Anion gap 10 5 - 15    Comment: Performed at [facility] at [facility], [address] [place], [place] [address]  CBC with Differential ([place] Only)     Status: Abnormal   Collection Time: 09/20/20 11:21 AM  Result Value Ref Range   WBC Count 9.1 4.0 - 10.5 K/uL   RBC 5.35 (H) 3.87 - 5.11 MIL/uL   Hemoglobin 16.7 (H) 12.0 - 15.0 g/dL   HCT 49.0 (H) 36.0 - 46.0 %   MCV 91.6 80.0 - 100.0 fL   MCH 31.2 26.0 - 34.0 pg   MCHC 34.1 30.0 - 36.0 g/dL   RDW 12.3 11.5 - 15.5 %   Platelet Count 286 150 - 400 K/uL   nRBC 0.0 0.0 - 0.2 %   Neutrophils Relative % 52 %   Neutro Abs 4.8 1.7 - 7.7 K/uL   Lymphocytes Relative 35 %   Lymphs Abs 3.2 0.7 - 4.0 K/uL   Monocytes Relative 10 %   Monocytes Absolute 0.9 0.1 - 1.0 K/uL   Eosinophils Relative 2 %   Eosinophils Absolute 0.2 0.0 - 0.5 K/uL   Basophils Relative 1 %   Basophils Absolute 0.1 0.0 - 0.1 K/uL  Immature Granulocytes 0 %   Abs Immature Granulocytes 0.03 0.00 - 0.07 K/uL    Comment: Performed at [facility] at [facility], [address], [place], [place] [address]  CBC with Differential ([place] Only)     Status: None   Collection Time: 10/29/20 11:00 AM  Result Value Ref Range   WBC Count 7.6 4.0 - 10.5 K/uL   RBC 4.47 3.87 - 5.11 MIL/uL   Hemoglobin 13.8 12.0 - 15.0 g/dL   HCT 41.7 36.0 - 46.0 %   MCV 93.3 80.0 - 100.0 fL   MCH 30.9 26.0 - 34.0 pg   MCHC 33.1 30.0 - 36.0 g/dL   RDW 13.3 11.5 - 15.5 %   Platelet Count 293 150 - 400 K/uL   nRBC 0.0 0.0 - 0.2 %   Neutrophils Relative % 47 %   Neutro Abs 3.7 1.7 - 7.7 K/uL   Lymphocytes Relative 38 %   Lymphs Abs 2.9 0.7 - 4.0 K/uL   Monocytes Relative 12 %   Monocytes Absolute 0.9 0.1 - 1.0 K/uL   Eosinophils Relative 2 %   Eosinophils Absolute 0.2 0.0 - 0.5 K/uL   Basophils Relative 1 %   Basophils Absolute 0.0 0.0 - 0.1 K/uL   Immature Granulocytes 0 %   Abs Immature Granulocytes 0.02 0.00 - 0.07 K/uL    Comment: Performed at [facility] at [facility], [address], [place], [place] [address]  Iron and TIBC     Status: Abnormal   Collection Time: 10/29/20 11:00 AM  Result Value Ref Range   Iron 176 (H) 41 - 142 ug/dL   TIBC 305 236 - 444 ug/dL   Saturation Ratios 58 (H) 21 - 57 %   UIBC 129 120 - 384 ug/dL    Comment: Performed at [facility], [address], [place], [place] [address]  Ferritin     Status: None   Collection Time: 10/29/20 11:00 AM  Result Value Ref Range   Ferritin 283 11 - 307 ng/mL    Comment: Performed at [facility], [place] [address], [place], [place] [address]  CMP ([place] only)     Status: None   Collection Time: 10/29/20 11:00 AM  Result Value Ref Range   Sodium 136 135 - 145 mmol/L   Potassium 4.0 3.5 - 5.1 mmol/L   Chloride 99 98 - 111 mmol/L   CO2 30 22 - 32 mmol/L   Glucose, Bld 93 70 - 99 mg/dL    Comment: Glucose reference range applies only to samples taken after fasting for at least 8 hours.   BUN 20 8 - 23 mg/dL   Creatinine 0.84 0.44 - 1.00 mg/dL   Calcium 9.7 8.9 - 10.3 mg/dL   Total Protein 6.7 6.5 - 8.1 g/dL   Albumin 4.2 3.5 - 5.0 g/dL   AST 19 15 - 41 U/L   ALT 10 0 - 44 U/L   Alkaline Phosphatase 82 38 - 126 U/L   Total Bilirubin 0.8 0.3 - 1.2 mg/dL   GFR, Estimated >60 >60 mL/min    Comment: (NOTE) Calculated using the CKD-EPI Creatinine Equation (2021)    Anion gap 7 5 - 15    Comment: Performed at [facility] at [facility], [address] [place], [place] [address]  CBC with Differential ([place])  Status: None   Collection Time: 11/20/20  2:00 PM  Result Value Ref Range   WBC Count 6.9 4.0 - 10.5 K/uL   RBC 4.67 3.87 - 5.11 MIL/uL   Hemoglobin 14.7 12.0 - 15.0 g/dL   HCT 43.2 36.0 - 46.0 %   MCV 92.5 80.0 - 100.0 fL   MCH 31.5 26.0 - 34.0 pg   MCHC 34.0 30.0 - 36.0 g/dL   RDW 12.5 11.5 - 15.5 %   Platelet Count 266 150 - 400 K/uL    nRBC 0.0 0.0 - 0.2 %   Neutrophils Relative % 50 %   Neutro Abs 3.4 1.7 - 7.7 K/uL   Lymphocytes Relative 37 %   Lymphs Abs 2.6 0.7 - 4.0 K/uL   Monocytes Relative 10 %   Monocytes Absolute 0.7 0.1 - 1.0 K/uL   Eosinophils Relative 2 %   Eosinophils Absolute 0.1 0.0 - 0.5 K/uL   Basophils Relative 1 %   Basophils Absolute 0.1 0.0 - 0.1 K/uL   Immature Granulocytes 0 %   Abs Immature Granulocytes 0.01 0.00 - 0.07 K/uL    Comment: Performed at [facility] at [facility], [address] [place], [place] [address]  CMP ([place] only)     Status: Abnormal   Collection Time: 11/20/20  2:00 PM  Result Value Ref Range   Sodium 135 135 - 145 mmol/L   Potassium 3.7 3.5 - 5.1 mmol/L   Chloride 100 98 - 111 mmol/L   CO2 27 22 - 32 mmol/L   Glucose, Bld 124 (H) 70 - 99 mg/dL    Comment: Glucose reference range applies only to samples taken after fasting for at least 8 hours.   BUN 16 8 - 23 mg/dL   Creatinine 0.73 0.44 - 1.00 mg/dL   Calcium 9.4 8.9 - 10.3 mg/dL   Total Protein 6.7 6.5 - 8.1 g/dL   Albumin 4.1 3.5 - 5.0 g/dL   AST 17 15 - 41 U/L   ALT 7 0 - 44 U/L   Alkaline Phosphatase 90 38 - 126 U/L   Total Bilirubin 0.7 0.3 - 1.2 mg/dL   GFR, Estimated >60 >60 mL/min    Comment: (NOTE) Calculated using the CKD-EPI Creatinine Equation (2021)    Anion gap 8 5 - 15    Comment: Performed at [facility] at [facility], [address], [place], [place] [address]    Assessment/Plan: 1. Hereditary hemochromatosis ([place]) Placed referral to get her transferred to [name] location for the [place]. - Ambulatory referral to Hematology  2. Benign paroxysmal positional vertigo due to bilateral vestibular disorder Prior history.  Recent episodes very well could be positional vertigo in nature.  Unable to reproduce on examination today.  She has however noted to be slightly hypotensive when OVS checked. Will plan  to reduce amlodipine to 5 mg daily. Recheck in 2 weeks. Will proceed with ENT referral to be cautious.  - Ambulatory referral to ENT  3. Joint swelling Inpatient with history of known OA.  Some symptoms and signs present today that would be more concerning for an inflammatory arthritis.  Suspect that a ESR would be elevated in my standing 85 years of age.  We will proceed with checking rheumatoid factor and ANA.  May need to see rheumatology for further evaluation and management. - Rheumatoid Factor - Antinuclear Antib (ANA)  4. Chronic fatigue Unspecified.  Could  be related to joint swelling/autoimmune issue which is being worked up.  We will also check labs as follows. - TSH - Vitamin D (25 hydroxy) - Rheumatoid Factor - Antinuclear Antib (ANA) - B12 and Folate Panel  This visit occurred during the SARS-CoV-2 public health emergency.  Safety protocols were in place, including screening questions prior to the visit, additional usage of staff PPE, and extensive cleaning of exam room while observing appropriate contact time as indicated for disinfecting solutions.     Leeanne Rio, PA-C

## 2020-11-22 LAB — RHEUMATOID FACTOR: Rheumatoid fact SerPl-aCnc: <14 IU/mL (ref <14)

## 2020-11-22 LAB — ANA: Anti Nuclear Antibody (ANA): NEGATIVE

## 2020-11-25 ENCOUNTER — Telehealth: Payer: Self-pay | Admitting: Hematology

## 2020-11-25 NOTE — Telephone Encounter (Signed)
Received a new hem referral from [place], [place] for Hereditary hemochromatosis. Maria [name] wanted to transfer her care closer to home. She has been cld and scheduled to see Dr. Irene Limbo on 2/10 at 1pm. Pt aware to arrive 20 minutes early.

## 2020-12-04 ENCOUNTER — Other Ambulatory Visit: Payer: Self-pay | Admitting: Family Medicine

## 2020-12-06 ENCOUNTER — Telehealth: Payer: Self-pay | Admitting: *Deleted

## 2020-12-06 NOTE — Telephone Encounter (Signed)
Left message on machine that we sent in refill for her medication and that Dr. Charlett Blake will take her back on as a patient if she chooses to.  Also we will do refills until she finds new pcp if she does not choose to come back.

## 2020-12-06 NOTE — Telephone Encounter (Signed)
Spoke with patient and gave her the info for [facility] for her to call for new pt appt.

## 2020-12-09 ENCOUNTER — Telehealth: Payer: Self-pay

## 2020-12-09 NOTE — Telephone Encounter (Signed)
Pt called in and cancelled her 12/11/20 appt as she had transferred her care to dr kale at [facility] as she lives closer to there      [place]

## 2020-12-11 ENCOUNTER — Inpatient Hospital Stay: Payer: Medicare Other

## 2020-12-11 ENCOUNTER — Inpatient Hospital Stay: Payer: Medicare Other | Admitting: Family

## 2020-12-11 NOTE — Progress Notes (Signed)
HEMATOLOGY/ONCOLOGY CONSULTATION NOTE  Date of Service: 12/12/2020  Patient Care Team: Delorse Limber as PCP - General (Family Medicine) Gaynelle Arabian, MD as Consulting Physician (Orthopedic Surgery) Crista Luria, MD as Consulting Physician (Dermatology) Aloha Gell, MD as Consulting Physician (Obstetrics and Gynecology) Star Age, MD as Consulting Physician (Neurology) [place], Holli Humbles, NP as Nurse Practitioner (Nurse Practitioner)  CHIEF COMPLAINTS/PURPOSE OF CONSULTATION:  Hereditary Hemochromatosis  HISTORY OF PRESENTING ILLNESS:   Maria [name] is a wonderful 85 y.o. female who has been referred to [place] by Raiford Noble, PA-C for evaluation and management of hereditary hemochromatosis. The pt reports that she is doing well overall.  The pt reports that she is trying to move care from her current providers at [facility] to here due to relative location to her house. The pt notes that she was diagnosed on 08/28/2019 with Hereditary Hemochromatosis that was inherited from both her parents after presenting to Dr. Tamala Julian due to presenting with a back ache and doing an extensive blood panel. At the time, the pt notes that she was feeling baseline and asymptomatic. The pt notes that since then, she feels fatigued at this time and joint pain.    The pt notes that she has a skin lesion on her lower, back left leg within the last 2-3 weeks and was given a topical cream. This has not improved her symptoms. Her doctor is planning on doing a biopsy of the lesion soon.   The pt notes that after starting phlebotomies every week in 2020, she became very fatigued. The pt notes that it has been over a month since she last had one. The pt describes the current plan as monitoring her symptoms versus demands for phlebotomies. The pt denies being on any supplement containing iron or Vitamin C. The pt has received her COVID vaccines and booster.  On review of systems,  pt reports fatigue and denies back pain, abdominal pain, leg swelling and any other symptoms.  MEDICAL HISTORY:  Past Medical History:  Diagnosis Date  . Cataract   . Complication of anesthesia    slow to awaken  . Depression   . Diverticulosis   . Goiter, nodular    [place] of thyroid stable 7/08  . Headache    hx of  . Heart murmur    due to rheumatic fever as child  . Hemochromatosis, hereditary ([place]) 09/01/2019  . Mitral valve prolapse   . Multiple thyroid nodules 11/24/2015  . Neoplasm of lung, malignant ([place]) 2002   Non-small cell surgical tx  . Osteoarthritis 11/14/2013   2010 intra-articular steroids X 3 S/P Physical Therapy in 12/14 TKR 12/11/13   . Osteopenia    Dr Pamala Hurry  . PONV (postoperative nausea and vomiting)    n/v  . Sleep apnea   . Vitamin D deficiency     SURGICAL HISTORY: Past Surgical History:  Procedure Laterality Date  . ABDOMINAL HYSTERECTOMY  1998   No BSO, for dysfunctional menses  . BLADDER SUSPENSION Bilateral   . BREAST BIOPSY Right   . COLONOSCOPY  2009 , 2014   Diverticulosis; Dr. Olevia Perches  . LOBECTOMY  12/02   RU; no radiation or chemo, Dr. Arlyce Dice  . TONSILLECTOMY    . TOTAL KNEE ARTHROPLASTY Right 12/11/2013   Procedure: RIGHT TOTAL KNEE ARTHROPLASTY;  Surgeon: Gearlean Alf, MD;  Location: [facility];  Service: Orthopedics;  Laterality: Right;  . TOTAL KNEE ARTHROPLASTY Left 06/28/2017   Procedure: LEFT TOTAL  KNEE ARTHROPLASTY;  Surgeon: Gaynelle Arabian, MD;  Location: [facility];  Service: Orthopedics;  Laterality: Left;  Adductor Block    SOCIAL HISTORY: Social History   Socioeconomic History  . Marital status: Divorced    Spouse name: Not on file  . Number of children: Not on file  . Years of education: college  . Highest education level: Not on file  Occupational History  . Occupation: Retired    Fish farm manager: RETIRED  Tobacco Use  . Smoking status: Former Smoker    Years: 20.00    Types: Cigarettes    Quit date: 05/03/1979    Years  since quitting: 41.6  . Smokeless tobacco: Never Used  . Tobacco comment: smoked 1962-1980, up to < 1 ppd  Vaping Use  . Vaping Use: Never used  Substance and Sexual Activity  . Alcohol use: Yes    Alcohol/week: 7.0 standard drinks    Types: 7 Standard drinks or equivalent per week    Comment: occasional  . Drug use: No  . Sexual activity: Not Currently    Comment: lives alone, dog, no dietary restrictions, eating heart healthy  Other Topics Concern  . Not on file  Social History Narrative   Pt gets regular exercise.   Drinks 2 cups of coffee a day    Social Determinants of Health   Financial Resource Strain: Low Risk   . Difficulty of Paying Living Expenses: Not hard at all  Food Insecurity: No Food Insecurity  . Worried About Charity fundraiser in the Last Year: Never true  . Ran Out of Food in the Last Year: Never true  Transportation Needs: No Transportation Needs  . Lack of Transportation (Medical): No  . Lack of Transportation (Non-Medical): No  Physical Activity: Not on file  Stress: Not on file  Social Connections: Not on file  Intimate Partner Violence: Not on file    FAMILY HISTORY: Family History  Problem Relation Age of Onset  . COPD Mother   . Coronary artery disease Father   . Colon cancer Father 22  . Heart attack Father 85  . Stroke Father        > 31  . Lung cancer Paternal Aunt        smoker  . Cancer Paternal Aunt        lung  . Lung cancer Maternal Grandfather        smoker  . COPD Maternal Grandfather   . Arthritis Sister   . Lung cancer Maternal Uncle        smoker  . Diabetes Neg Hx     ALLERGIES:  has No Known Allergies.  MEDICATIONS:  Current Outpatient Medications  Medication Sig Dispense Refill  . acetaminophen (TYLENOL) 500 MG tablet Take 500 mg by mouth every 6 (six) hours as needed.    Marland Kitchen amLODipine (NORVASC) 5 MG tablet TAKE 1.5 TABLETS (7.5 MG TOTAL) BY MOUTH DAILY. NEEDS OV 135 tablet 0  . DULoxetine (CYMBALTA) 30 MG  capsule Take 1 capsule (30 mg total) by mouth daily. 90 capsule 1  . ibuprofen (ADVIL) 200 MG tablet Take 200 mg by mouth every 6 (six) hours as needed for headache.     No current facility-administered medications for this visit.    REVIEW OF SYSTEMS:   10 Point review of Systems was done is negative except as noted above.  PHYSICAL EXAMINATION: ECOG PERFORMANCE STATUS: 1 - Symptomatic but completely ambulatory  Vitals:   12/12/20 1313  BP: 140/85  Pulse: 97  Resp: 16  Temp: 98 F (36.7 C)  SpO2: 97%   Filed Weights   12/12/20 1313  Weight: 174 lb (78.9 kg)   .Body mass index is 28.96 kg/m.   GENERAL:alert, in no acute distress and comfortable SKIN: no acute rashes, no significant lesions EYES: conjunctiva are pink and non-injected, sclera anicteric OROPHARYNX: MMM, no exudates, no oropharyngeal erythema or ulceration NECK: supple, no JVD LYMPH:  no palpable lymphadenopathy in the cervical, axillary or inguinal regions LUNGS: clear to auscultation b/l with normal respiratory effort HEART: regular rate & rhythm ABDOMEN:  normoactive bowel sounds , non tender, not distended. Extremity: no pedal edema PSYCH: alert & oriented x 3 with fluent speech NEURO: no focal motor/sensory deficits  LABORATORY DATA:  I have reviewed the data as listed  . CBC Latest Ref Rng & Units 11/20/2020 10/29/2020 09/20/2020  WBC 4.0 - 10.5 K/uL 6.9 7.6 9.1  Hemoglobin 12.0 - 15.0 g/dL 14.7 13.8 16.7(H)  Hematocrit 36.0 - 46.0 % 43.2 41.7 49.0(H)  Platelets 150 - 400 K/uL 266 293 286    . CMP Latest Ref Rng & Units 11/20/2020 10/29/2020 09/20/2020  Glucose 70 - 99 mg/dL 124(H) 93 102(H)  BUN 8 - 23 mg/dL 16 20 19   Creatinine 0.44 - 1.00 mg/dL 0.73 0.84 0.70  Sodium 135 - 145 mmol/L 135 136 137  Potassium 3.5 - 5.1 mmol/L 3.7 4.0 3.9  Chloride 98 - 111 mmol/L 100 99 100  CO2 22 - 32 mmol/L 27 30 27   Calcium 8.9 - 10.3 mg/dL 9.4 9.7 9.9  Total Protein 6.5 - 8.1 g/dL 6.7 6.7 7.4   Total Bilirubin 0.3 - 1.2 mg/dL 0.7 0.8 0.8  Alkaline Phos 38 - 126 U/L 90 82 99  AST 15 - 41 U/L 17 19 22   ALT 0 - 44 U/L 7 10 11    08/28/2019    RADIOGRAPHIC STUDIES: I have personally reviewed the radiological images as listed and agreed with the findings in the report. No results found.  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 Advised pt that her mutation (C282Y) is the highest risk for having symptoms related to this disorder.  -Advised pt that her heavier periods protected her from iron and ferritin levels being too high. Unusual for people to diagnose at this age with no organ failure.  -Advised pt that the organs most sensitive to increased iron levels are the heart and the liver.  -Discussed amount of phlebotomies needed and diet. Red meats and alcohol increase iron absorption and would require increased phlebotomies. Advised pt that tolerance plays a role in amount needed. -Recommended pt goal Ferritin <100 if tolerable, <200 if not as tolerable. This would prevent any organ failure due to the excess iron. -Discuss iron saturation and goal <50 if possible, <60 mandatory.  -Discussed differences between Ferritin levels and Iron Sat levels. Advised pt free iron in blood is toxic to organs. -Recommended pt avoid excessive alcohol intake.  -Advised pt her iron levels are not concerning at this time. Will switch to phlebotomies every 3 months once Ferritin levels reach <100.  -Discussed testing for organ failure related to increased iron levels. No indication for extensive testing at this time, as liver enzymes not elevated. -Will start with phlebotomies every 2 months at this time. Once below 200, will switch to 3 months. -Recommend pt avoid raw seafood. -Continue Vitamin B12, Vitamin D3, Folate  capsule daily. -Will see back in 2 weeks with labs and phlebotomy. Will see  back in 6 months for f/u.     FOLLOW UP: Labs and therapeutic phlebotomy every 2 monthsx 3 starting in 2 weeks MD visit in 6 months  All of the patients questions were answered with apparent satisfaction. The patient knows to call the clinic with any problems, questions or concerns.  I spent 40 minutes counseling the patient face to face. The total time spent in the appointment was 45 minutes and more than 50% was on counseling and direct patient cares.    Sullivan Lone MD [place] AAHIVMS [facility] Hematology/Oncology Physician [facility]  (Office):       [phone] (Work cell):  [phone] (Fax):           [phone]  12/12/2020 1:52 PM  I, Reinaldo Raddle, am acting as scribe for Dr. Sullivan Lone, MD.  .I have reviewed the above documentation for accuracy and completeness, and I agree with the above. Brunetta Genera MD

## 2020-12-12 ENCOUNTER — Inpatient Hospital Stay: Payer: Medicare Other | Attending: Hematology & Oncology | Admitting: Hematology

## 2020-12-12 ENCOUNTER — Other Ambulatory Visit: Payer: Self-pay

## 2020-12-12 DIAGNOSIS — M8580 Other specified disorders of bone density and structure, unspecified site: Secondary | ICD-10-CM | POA: Insufficient documentation

## 2020-12-12 DIAGNOSIS — Z87891 Personal history of nicotine dependence: Secondary | ICD-10-CM | POA: Diagnosis not present

## 2020-12-12 DIAGNOSIS — F32A Depression, unspecified: Secondary | ICD-10-CM | POA: Diagnosis not present

## 2020-12-12 DIAGNOSIS — Z79899 Other long term (current) drug therapy: Secondary | ICD-10-CM | POA: Diagnosis not present

## 2020-12-12 DIAGNOSIS — I341 Nonrheumatic mitral (valve) prolapse: Secondary | ICD-10-CM | POA: Insufficient documentation

## 2020-12-12 DIAGNOSIS — R011 Cardiac murmur, unspecified: Secondary | ICD-10-CM | POA: Diagnosis not present

## 2020-12-12 DIAGNOSIS — R5383 Other fatigue: Secondary | ICD-10-CM | POA: Diagnosis not present

## 2020-12-12 DIAGNOSIS — E559 Vitamin D deficiency, unspecified: Secondary | ICD-10-CM | POA: Diagnosis not present

## 2020-12-12 DIAGNOSIS — M1990 Unspecified osteoarthritis, unspecified site: Secondary | ICD-10-CM | POA: Insufficient documentation

## 2020-12-31 ENCOUNTER — Encounter: Payer: Self-pay | Admitting: Internal Medicine

## 2020-12-31 ENCOUNTER — Other Ambulatory Visit: Payer: Self-pay

## 2020-12-31 ENCOUNTER — Ambulatory Visit: Payer: Medicare Other | Admitting: Internal Medicine

## 2020-12-31 VITALS — BP 124/82 | HR 94 | Temp 98.1°F | Ht 65.0 in | Wt 172.0 lb

## 2020-12-31 DIAGNOSIS — I10 Essential (primary) hypertension: Secondary | ICD-10-CM

## 2020-12-31 DIAGNOSIS — M542 Cervicalgia: Secondary | ICD-10-CM | POA: Diagnosis not present

## 2020-12-31 DIAGNOSIS — R270 Ataxia, unspecified: Secondary | ICD-10-CM

## 2020-12-31 DIAGNOSIS — E042 Nontoxic multinodular goiter: Secondary | ICD-10-CM

## 2020-12-31 DIAGNOSIS — R29898 Other symptoms and signs involving the musculoskeletal system: Secondary | ICD-10-CM | POA: Diagnosis not present

## 2020-12-31 NOTE — Patient Instructions (Signed)

## 2020-12-31 NOTE — Progress Notes (Signed)
Subjective:  Patient ID: [name], female    DOB: 19-Feb-1936  Age: 85 y.o. MRN: [id number]  CC: Gait Problem  This visit occurred during the SARS-CoV-2 public health emergency.  Safety protocols were in place, including screening questions prior to the visit, additional usage of staff PPE, and extensive cleaning of exam room while observing appropriate contact time as indicated for disinfecting solutions.    HPI Maria [name] presents for establishing.  she was in her usual state of health until about 2 months ago when she started to experience episodes of lightheadedness.  She has had 2 episodes, both associated with yoga, when she developed dizziness and lightheadedness that was so severe that she had an episode of nausea and vomiting.  She has a history of vertigo.  She also complains of mild ataxia, feeling like she is going to fall forward.  She has a generalized sensation of weakness but more in the right hand than elsewhere.  She complains of chronic, nonradiating neck and low back pain.  History Maria [name] has a past medical history of Cataract, Complication of anesthesia, Depression, Diverticulosis, Goiter, nodular, Headache, Heart murmur, Hemochromatosis, hereditary ([place]) (09/01/2019), Mitral valve prolapse, Multiple thyroid nodules (11/24/2015), Neoplasm of lung, malignant ([place]) (2002), Osteoarthritis (11/14/2013), Osteopenia, PONV (postoperative nausea and vomiting), Sleep apnea, and Vitamin D deficiency.   She has a past surgical history that includes Lobectomy (12/02); Colonoscopy (2009 , 2014); Tonsillectomy; Breast biopsy (Right); Total knee arthroplasty (Right, 12/11/2013); Bladder suspension (Bilateral); Abdominal hysterectomy (1998); and Total knee arthroplasty (Left, 06/28/2017).   Her family history includes Arthritis in her sister; COPD in her maternal grandfather and mother; Cancer in her paternal aunt; Colon cancer (age of onset: 7) in her father; Coronary artery  disease in her father; Heart attack (age of onset: 74) in her father; Lung cancer in her maternal grandfather, maternal uncle, and paternal aunt; Stroke in her father.She reports that she quit smoking about 41 years ago. Her smoking use included cigarettes. She quit after 20.00 years of use. She has never used smokeless tobacco. She reports current alcohol use of about 7.0 standard drinks of alcohol per week. She reports that she does not use drugs.  Outpatient Medications Prior to Visit  Medication Sig Dispense Refill  . acetaminophen (TYLENOL) 500 MG tablet Take 500 mg by mouth every 6 (six) hours as needed.    . DULoxetine (CYMBALTA) 30 MG capsule Take 1 capsule (30 mg total) by mouth daily. 90 capsule 1  . amLODipine (NORVASC) 5 MG tablet TAKE 1.5 TABLETS (7.5 MG TOTAL) BY MOUTH DAILY. NEEDS OV 135 tablet 0  . ibuprofen (ADVIL) 200 MG tablet Take 200 mg by mouth every 6 (six) hours as needed for headache.     No facility-administered medications prior to visit.    ROS Review of Systems  Constitutional: Negative.  Negative for diaphoresis and fatigue.  HENT: Negative.  Negative for trouble swallowing.   Eyes: Negative.  Negative for visual disturbance.  Respiratory: Positive for apnea. Negative for cough and chest tightness.   Cardiovascular: Negative for chest pain, palpitations and leg swelling.  Gastrointestinal: Negative for abdominal pain, constipation, diarrhea, nausea and vomiting.  Endocrine: Negative.   Genitourinary: Negative.  Negative for difficulty urinating.  Musculoskeletal: Positive for back pain, gait problem and neck pain. Negative for arthralgias and myalgias.  Skin: Negative.   Neurological: Positive for dizziness, weakness and light-headedness. Negative for tremors, syncope, facial asymmetry, speech difficulty, numbness and headaches.  Hematological: Negative for adenopathy.  Does not bruise/bleed easily.  Psychiatric/Behavioral: Negative.     Objective:  BP  124/82   Pulse 94   Temp 98.1 F (36.7 C) (Oral)   Ht 5\' 5"  (1.651 m)   Wt 172 lb (78 kg)   SpO2 97%   BMI 28.62 kg/m   Physical Exam Vitals reviewed.  Constitutional:      Appearance: Normal appearance.  HENT:     Mouth/Throat:     Mouth: Mucous membranes are moist.  Eyes:     General: No scleral icterus.    Extraocular Movements: Extraocular movements intact.     Pupils: Pupils are equal, round, and reactive to light.  Cardiovascular:     Rate and Rhythm: Normal rate and regular rhythm.     Heart sounds: No murmur heard.   Pulmonary:     Effort: Pulmonary effort is normal.     Breath sounds: No stridor. No wheezing, rhonchi or rales.  Abdominal:     General: Abdomen is flat. Bowel sounds are normal. There is no distension.     Palpations: Abdomen is soft. There is no hepatomegaly, splenomegaly or mass.     Tenderness: There is no abdominal tenderness.  Musculoskeletal:        General: Normal range of motion.     Cervical back: Neck supple.     Right lower leg: No edema.     Left lower leg: No edema.  Lymphadenopathy:     Cervical: No cervical adenopathy.  Skin:    General: Skin is warm and dry.  Neurological:     Mental Status: She is alert.     Cranial Nerves: Cranial nerves are intact.     Sensory: Sensation is intact.     Motor: Weakness (right hand) present. No tremor or abnormal muscle tone.     Coordination: Romberg sign positive. Coordination normal. Finger-Nose-Finger Test and Heel to [facility] Test normal. Rapid alternating movements normal.     Deep Tendon Reflexes: Reflexes normal. Babinski sign absent on the right side. Babinski sign absent on the left side.     Reflex Scores:      Tricep reflexes are 1+ on the right side and 1+ on the left side.      Bicep reflexes are 1+ on the right side and 1+ on the left side.      Brachioradialis reflexes are 1+ on the right side and 1+ on the left side.      Patellar reflexes are 1+ on the right side and 1+ on the  left side.      Achilles reflexes are 1+ on the right side and 1+ on the left side.    Comments: She leaned forward on the rhomberg test     Lab Results  Component Value Date   WBC 6.9 11/20/2020   HGB 14.7 11/20/2020   HCT 43.2 11/20/2020   PLT 266 11/20/2020   GLUCOSE 124 (H) 11/20/2020   CHOL 205 (H) 07/07/2019   TRIG 128.0 07/07/2019   HDL 64.70 07/07/2019   LDLDIRECT 137.2 10/23/2008   LDLCALC 115 (H) 07/07/2019   ALT 7 11/20/2020   AST 17 11/20/2020   NA 135 11/20/2020   K 3.7 11/20/2020   CL 100 11/20/2020   CREATININE 0.73 11/20/2020   BUN 16 11/20/2020   CO2 27 11/20/2020   TSH 3.64 11/21/2020   INR 0.95 06/17/2017   HGBA1C 5.6 02/13/2014    Assessment & Plan:   Eren was seen today for gait  problem.  Diagnoses and all orders for this visit:  Multiple thyroid nodules  Hypertension, unspecified type- For her age her blood pressure is overcontrolled.  This may be contributing to her symptoms.  I recommended that she stop taking amlodipine.  Ataxia- She has multiple neurological symptoms and an abnormal neurologic exam.  I recommended that she undergo an MRI of the brain to screen for CVA, mass, NPH, demyelination, or degenerative brain disorder. -     MR Brain Wo Contrast; Future -     MR Cervical Spine Wo Contrast; Future  Bilateral neck pain- She has weakness in her right hand.  I recommended that she undergo an MRI of the cervical spine to see if there is spinal stenosis, nerve impingement, or tumor. -     MR Cervical Spine Wo Contrast; Future  Cervicalgia -     MR Cervical Spine Wo Contrast; Future  Weakness of right hand -     MR Brain Wo Contrast; Future -     MR Cervical Spine Wo Contrast; Future   I have discontinued Hoyle Sauer B. Thurmon's ibuprofen and amLODipine. I am also having her maintain her acetaminophen and DULoxetine.  No orders of the defined types were placed in this encounter.    Follow-up: Return in about 4 weeks (around  01/28/2021).  Scarlette Calico, MD

## 2021-01-19 ENCOUNTER — Other Ambulatory Visit: Payer: Self-pay

## 2021-01-19 ENCOUNTER — Ambulatory Visit
Admission: RE | Admit: 2021-01-19 | Discharge: 2021-01-19 | Disposition: A | Discharge status: Home / Self Care | Payer: Medicare Other | Source: Ambulatory Visit | Attending: Internal Medicine | Admitting: Internal Medicine

## 2021-01-19 DIAGNOSIS — R29898 Other symptoms and signs involving the musculoskeletal system: Secondary | ICD-10-CM

## 2021-01-19 DIAGNOSIS — R270 Ataxia, unspecified: Secondary | ICD-10-CM

## 2021-01-19 DIAGNOSIS — M542 Cervicalgia: Secondary | ICD-10-CM

## 2021-01-20 ENCOUNTER — Encounter: Payer: Self-pay | Admitting: Internal Medicine

## 2021-01-28 ENCOUNTER — Ambulatory Visit (INDEPENDENT_AMBULATORY_CARE_PROVIDER_SITE_OTHER): Payer: Medicare Other | Admitting: Otolaryngology

## 2021-02-03 ENCOUNTER — Ambulatory Visit: Payer: Medicare Other | Admitting: Internal Medicine

## 2021-02-03 ENCOUNTER — Encounter: Payer: Self-pay | Admitting: Internal Medicine

## 2021-02-03 ENCOUNTER — Other Ambulatory Visit: Payer: Self-pay

## 2021-02-03 VITALS — BP 126/82 | HR 95 | Temp 98.2°F | Ht 65.0 in | Wt 169.0 lb

## 2021-02-03 DIAGNOSIS — M4802 Spinal stenosis, cervical region: Secondary | ICD-10-CM | POA: Diagnosis not present

## 2021-02-03 DIAGNOSIS — Z0000 Encounter for general adult medical examination without abnormal findings: Secondary | ICD-10-CM | POA: Diagnosis not present

## 2021-02-03 DIAGNOSIS — I10 Essential (primary) hypertension: Secondary | ICD-10-CM

## 2021-02-03 DIAGNOSIS — R739 Hyperglycemia, unspecified: Secondary | ICD-10-CM | POA: Diagnosis not present

## 2021-02-03 DIAGNOSIS — Z23 Encounter for immunization: Secondary | ICD-10-CM | POA: Diagnosis not present

## 2021-02-03 DIAGNOSIS — E782 Mixed hyperlipidemia: Secondary | ICD-10-CM

## 2021-02-03 NOTE — Patient Instructions (Signed)
Health Maintenance, Female Adopting a healthy lifestyle and getting preventive care are important in promoting health and wellness. Ask your health care provider about:  The right schedule for you to have regular tests and exams.  Things you can do on your own to prevent diseases and keep yourself healthy. What should I know about diet, weight, and exercise? Eat a healthy diet  Eat a diet that includes plenty of vegetables, fruits, low-fat dairy products, and lean protein.  Do not eat a lot of foods that are high in solid fats, added sugars, or sodium.   Maintain a healthy weight Body mass index (BMI) is used to identify weight problems. It estimates body fat based on height and weight. Your health care provider can help determine your BMI and help you achieve or maintain a healthy weight. Get regular exercise Get regular exercise. This is one of the most important things you can do for your health. Most adults should:  Exercise for at least 150 minutes each week. The exercise should increase your heart rate and make you sweat (moderate-intensity exercise).  Do strengthening exercises at least twice a week. This is in addition to the moderate-intensity exercise.  Spend less time sitting. Even light physical activity can be beneficial. Watch cholesterol and blood lipids Have your blood tested for lipids and cholesterol at 83 years of age, then have this test every 5 years. Have your cholesterol levels checked more often if:  Your lipid or cholesterol levels are high.  You are older than 83 years of age.  You are at high risk for heart disease. What should I know about cancer screening? Depending on your health history and family history, you may need to have cancer screening at various ages. This may include screening for:  Breast cancer.  Cervical cancer.  Colorectal cancer.  Skin cancer.  Lung cancer. What should I know about heart disease, diabetes, and high blood  pressure? Blood pressure and heart disease  High blood pressure causes heart disease and increases the risk of stroke. This is more likely to develop in people who have high blood pressure readings, are of African descent, or are overweight.  Have your blood pressure checked: ? Every 3-5 years if you are 18-39 years of age. ? Every year if you are 40 years old or older. Diabetes Have regular diabetes screenings. This checks your fasting blood sugar level. Have the screening done:  Once every three years after age 40 if you are at a normal weight and have a low risk for diabetes.  More often and at a younger age if you are overweight or have a high risk for diabetes. What should I know about preventing infection? Hepatitis B If you have a higher risk for hepatitis B, you should be screened for this virus. Talk with your health care provider to find out if you are at risk for hepatitis B infection. Hepatitis C Testing is recommended for:  Everyone born from 1945 through 1965.  Anyone with known risk factors for hepatitis C. Sexually transmitted infections (STIs)  Get screened for STIs, including gonorrhea and chlamydia, if: ? You are sexually active and are younger than 83 years of age. ? You are older than 83 years of age and your health care provider tells you that you are at risk for this type of infection. ? Your sexual activity has changed since you were last screened, and you are at increased risk for chlamydia or gonorrhea. Ask your health care provider   if you are at risk.  Ask your health care provider about whether you are at high risk for HIV. Your health care provider may recommend a prescription medicine to help prevent HIV infection. If you choose to take medicine to prevent HIV, you should first get tested for HIV. You should then be tested every 3 months for as long as you are taking the medicine. Pregnancy  If you are about to stop having your period (premenopausal) and  you may become pregnant, seek counseling before you get pregnant.  Take 400 to 800 micrograms (mcg) of folic acid every day if you become pregnant.  Ask for birth control (contraception) if you want to prevent pregnancy. Osteoporosis and menopause Osteoporosis is a disease in which the bones lose minerals and strength with aging. This can result in bone fractures. If you are 65 years old or older, or if you are at risk for osteoporosis and fractures, ask your health care provider if you should:  Be screened for bone loss.  Take a calcium or vitamin D supplement to lower your risk of fractures.  Be given hormone replacement therapy (HRT) to treat symptoms of menopause. Follow these instructions at home: Lifestyle  Do not use any products that contain nicotine or tobacco, such as cigarettes, e-cigarettes, and chewing tobacco. If you need help quitting, ask your health care provider.  Do not use street drugs.  Do not share needles.  Ask your health care provider for help if you need support or information about quitting drugs. Alcohol use  Do not drink alcohol if: ? Your health care provider tells you not to drink. ? You are pregnant, may be pregnant, or are planning to become pregnant.  If you drink alcohol: ? Limit how much you use to 0-1 drink a day. ? Limit intake if you are breastfeeding.  Be aware of how much alcohol is in your drink. In the [place] one drink equals one 12 oz bottle of beer (355 mL), one 5 oz glass of wine (148 mL), or one 1 oz glass of hard liquor (44 mL). General instructions  Schedule regular health, dental, and eye exams.  Stay current with your vaccines.  Tell your health care provider if: ? You often feel depressed. ? You have ever been abused or do not feel safe at home. Summary  Adopting a healthy lifestyle and getting preventive care are important in promoting health and wellness.  Follow your health care provider's instructions about healthy  diet, exercising, and getting tested or screened for diseases.  Follow your health care provider's instructions on monitoring your cholesterol and blood pressure. This information is not intended to replace advice given to you by your health care provider. Make sure you discuss any questions you have with your health care provider. Document Revised: 10/12/2018 Document Reviewed: 10/12/2018 Elsevier Patient Education  2021 [employer]vier Inc.  

## 2021-02-03 NOTE — Progress Notes (Signed)
Subjective:  Patient ID: Maria [name], female    DOB: 08-Feb-1936  Age: 85 y.o. MRN: [id number]  CC: Annual Exam and Follow-up  This visit occurred during the SARS-CoV-2 public health emergency.  Safety protocols were in place, including screening questions prior to the visit, additional usage of staff PPE, and extensive cleaning of exam room while observing appropriate contact time as indicated for disinfecting solutions.    HPI Maria [name] presents for a CPX and f/up -   She recently underwent MRI of brain and c-spine for neuro s/s. The brain was ok but the c-spine was positive for DDD with stenosis.  Outpatient Medications Prior to Visit  Medication Sig Dispense Refill  . acetaminophen (TYLENOL) 500 MG tablet Take 500 mg by mouth every 6 (six) hours as needed.    Marland Kitchen COVID-19 mRNA vaccine, Moderna, 100 MCG/0.5ML injection INJECT AS DIRECTED .25 mL 0  . DULoxetine (CYMBALTA) 30 MG capsule Take 1 capsule (30 mg total) by mouth daily. 90 capsule 1   No facility-administered medications prior to visit.    ROS Review of Systems  Constitutional: Negative.  Negative for appetite change, chills, diaphoresis, fatigue and fever.  HENT: Negative.  Negative for trouble swallowing.   Eyes: Negative.  Eye discharge:     Eye itching:       Respiratory: Negative for cough, chest tightness (          ), shortness of breath and wheezing.   Cardiovascular: Negative for chest pain, palpitations and leg swelling.  Gastrointestinal: Negative for abdominal pain, constipation, diarrhea, nausea and vomiting.  Endocrine: Negative.  Negative for polydipsia, polyphagia and polyuria (                    ). Heat intolerance:                          Genitourinary: Negative.  Negative for frequency.  Musculoskeletal: Positive for back pain, gait problem and neck pain. Negative for myalgias.       Pain in LUE  Skin: Negative.    Neurological: Positive for weakness. Negative for numbness.  Hematological: Negative for adenopathy. Does not bruise/bleed easily.  Psychiatric/Behavioral: Negative.     Objective:  BP 126/82   Pulse 95   Temp 98.2 F (36.8 C) (Oral)   Ht 5\' 5"  (1.651 m)   Wt 169 lb (76.7 kg)   SpO2 96%   BMI 28.12 kg/m   BP Readings from Last 3 Encounters:  02/03/21 126/82  12/31/20 124/82  12/12/20 140/85    Wt Readings from Last 3 Encounters:  02/03/21 169 lb (76.7 kg)  12/31/20 172 lb (78 kg)  12/12/20 174 lb (78.9 kg)    Physical Exam Vitals reviewed.  Constitutional:      Appearance: Normal appearance.  HENT:     Nose: Nose normal.     Mouth/Throat:     Mouth: Mucous membranes are moist.  Eyes:     General: No scleral icterus.    Conjunctiva/sclera: Conjunctivae normal.  Cardiovascular:     Rate and Rhythm: Normal rate.     Heart sounds: No murmur heard.   Pulmonary:     Effort: Pulmonary effort is normal.     Breath sounds: No stridor. No wheezing, rhonchi or rales.  Abdominal:     General: Abdomen is flat. Bowel sounds are normal. There is no distension.     Palpations: Abdomen is soft.  There is no hepatomegaly, splenomegaly or mass.  Musculoskeletal:        General: Normal range of motion.     Cervical back: Neck supple.     Right lower leg: No edema.     Left lower leg: No edema.  Skin:    General: Skin is warm and dry.     Coloration: Skin is not pale.  Neurological:     Mental Status: She is alert. Mental status is at baseline.  Psychiatric:        Mood and Affect: Mood normal.     Lab Results  Component Value Date   WBC 6.9 11/20/2020   HGB 14.7 11/20/2020   HCT 43.2 11/20/2020   PLT 266 11/20/2020   GLUCOSE 124 (H) 11/20/2020   CHOL 205 (H) 07/07/2019   TRIG 128.0 07/07/2019   HDL 64.70 07/07/2019   LDLDIRECT 137.2 10/23/2008   LDLCALC 115 (H) 07/07/2019   ALT 7 11/20/2020   AST 17 11/20/2020   NA 135 11/20/2020   K 3.7 11/20/2020   CL  100 11/20/2020   CREATININE 0.73 11/20/2020   BUN 16 11/20/2020   CO2 27 11/20/2020   TSH 3.64 11/21/2020   INR 0.95 06/17/2017   HGBA1C 5.6 02/13/2014    MR Brain Wo Contrast  Result Date: 01/20/2021 CLINICAL DATA:  Ataxia EXAM: MRI HEAD WITHOUT CONTRAST TECHNIQUE: Multiplanar, multiecho pulse sequences of the brain and surrounding structures were obtained without intravenous contrast. COMPARISON:  None. FINDINGS: Brain: There is no acute infarction or intracranial hemorrhage. There is no intracranial mass, mass effect, or edema. There is no hydrocephalus or extra-axial fluid collection. Prominence of the ventricles and sulci reflects mild generalized parenchymal volume loss. Patchy T2 hyperintensity in the supratentorial white matter is nonspecific but probably reflects mild chronic microvascular ischemic changes. Vascular: Major vessel flow voids at the skull base are preserved. Skull and upper cervical spine: Normal marrow signal is preserved. Sinuses/Orbits: Moderate circumferential left maxillary sinus mucosal. Mild ethmoid mucosal thickening. Bilateral lens replacements. Other: Sella is unremarkable.  Mastoid air cells are clear. IMPRESSION: No evidence of recent infarction, hemorrhage, or mass. Mild chronic microvascular ischemic changes. Electronically Signed   By: Macy Mis M.D.   On: 01/20/2021 09:16   MR Cervical Spine Wo Contrast  Result Date: 01/20/2021 CLINICAL DATA:  Initial evaluation for chronic neck pain with 2 month history of difficulty walking, weakness at lower right side. Remote history of lung cancer. EXAM: MRI CERVICAL SPINE WITHOUT CONTRAST TECHNIQUE: Multiplanar, multisequence MR imaging of the cervical spine was performed. No intravenous contrast was administered. COMPARISON:  None available. FINDINGS: Alignment: Straightening with mild reversal of the normal cervical lordosis. Trace anterolisthesis of C2 on C3, C3 on C4, and C7 on T1. Vertebrae: Vertebral body height  maintained without acute or chronic fracture. Bone marrow signal intensity within normal limits. No worrisome osseous lesions. Discogenic reactive endplate change present about the C6-7 interspace. Reactive marrow edema seen about the right C3-4 and left C4-5 and C7-T1 facets due to facet arthritis. No other abnormal marrow edema. Cord: Normal signal and morphology. Posterior Fossa, vertebral arteries, paraspinal tissues: Visualized brain and posterior fossa within normal limits. Craniocervical junction normal. Paraspinous and prevertebral soft tissues within normal limits. Normal intravascular flow voids seen within the vertebral arteries bilaterally. Disc levels: C2-C3: Trace anterolisthesis. Disc desiccation without significant disc bulge. Moderate left with mild right facet hypertrophy. No canal or foraminal stenosis. C3-C4: Trace anterolisthesis. Mild disc bulge with uncovertebral hypertrophy. Moderate left  worse than right facet degeneration. No spinal stenosis. Mild to moderate bilateral C4 foraminal narrowing. C4-C5: Diffuse disc bulge with bilateral uncovertebral hypertrophy. Superimposed left paracentral disc protrusion indents the left ventral thecal sac, contacting and flattening the ventral cord (series 6, image 13). Moderate left with mild right facet degeneration. Mild spinal stenosis. Moderate left with mild to moderate right C5 foraminal narrowing. C5-C6: Degenerative intervertebral disc space narrowing with diffuse disc bulge and bilateral uncovertebral hypertrophy. Right paracentral disc osteophyte complex indents the right ventral thecal sac, contacting mildly flattening the right ventral cord (series 7, image 17). Mild spinal stenosis. Moderate right C6 foraminal narrowing. Left neural foramina remains patent. C6-C7: Degenerative intervertebral disc space narrowing with diffuse disc osteophyte, asymmetric to the right. Broad posterior component flattens and partially faces the ventral thecal  sac, greater on the right. Mild spinal stenosis with flattening of the right ventral cord. No cord signal changes. Moderate right worse than left C7 foraminal narrowing. C7-T1: Anterolisthesis. Mild diffuse disc bulge. Severe left with mild to moderate right facet degeneration. No significant spinal stenosis. Mild left C8 foraminal narrowing. Right neural foramina remains patent. Visualized upper thoracic spine demonstrates mild noncompressive disc bulging without significant stenosis. IMPRESSION: 1. Multilevel cervical spondylosis with resultant mild spinal stenosis at C4-5 through C6-7. 2. Multifactorial degenerative changes with resultant multilevel foraminal narrowing as above. Notable findings include moderate left C5 and right C6 foraminal stenosis, with moderate right worse than left C7 foraminal narrowing. 3. Moderate to advanced multilevel facet arthrosis with reactive marrow edema about the right C3-4, left C4-5, and left C7-T1 facets. Findings could contribute to underlying neck pain. Electronically Signed   By: Jeannine Boga M.D.   On: 01/20/2021 03:23    Assessment & Plan:   Lycia was seen today for annual exam and follow-up.  Diagnoses and all orders for this visit:  Routine general medical examination at a health care facility- Exam completed, labs reviewed, vaccines reviewed and updated, no cancer screenings indicated, patient education was given.  Degenerative cervical spinal stenosis -     Ambulatory referral to Neurosurgery  Hypertension, unspecified type- Her blood pressure is adequately well controlled.  I will monitor her electrolytes and renal function. -     Basic metabolic panel; Future  Hyperlipidemia, mixed- I will check a fasting lipid panel and will treat with if indicated. -     Lipid panel; Future  Chronic hyperglycemia- I will screen her for DM 2. -     Hemoglobin A1c; Future -     Basic metabolic panel; Future  Other orders -     Pneumococcal  polysaccharide vaccine 23-valent greater than or equal to 2yo subcutaneous/IM   I am having Alden B. Zwack maintain her acetaminophen, DULoxetine, and COVID-19 mRNA vaccine (Moderna).  No orders of the defined types were placed in this encounter.    Follow-up: Return in about 6 months (around 08/05/2021).  Scarlette Calico, MD

## 2021-02-05 ENCOUNTER — Other Ambulatory Visit (INDEPENDENT_AMBULATORY_CARE_PROVIDER_SITE_OTHER): Payer: Medicare Other

## 2021-02-05 DIAGNOSIS — R739 Hyperglycemia, unspecified: Secondary | ICD-10-CM | POA: Diagnosis not present

## 2021-02-05 DIAGNOSIS — I10 Essential (primary) hypertension: Secondary | ICD-10-CM

## 2021-02-05 DIAGNOSIS — E782 Mixed hyperlipidemia: Secondary | ICD-10-CM | POA: Diagnosis not present

## 2021-02-05 LAB — BASIC METABOLIC PANEL
BUN: 13 mg/dL (ref 6–23)
CO2: 31 mEq/L (ref 19–32)
Calcium: 9.3 mg/dL (ref 8.4–10.5)
Chloride: 100 mEq/L (ref 96–112)
Creatinine, Ser: 0.75 mg/dL (ref 0.40–1.20)
GFR: 73.75 mL/min (ref >60.00)
Glucose, Bld: 90 mg/dL (ref 70–99)
Potassium: 4.1 mEq/L (ref 3.5–5.1)
Sodium: 138 mEq/L (ref 135–145)

## 2021-02-05 LAB — LIPID PANEL
Cholesterol: 198 mg/dL (ref 0–200)
HDL: 53.3 mg/dL (ref >39.00)
LDL Cholesterol: 121 mg/dL — ABNORMAL HIGH (ref 0–99)
NonHDL: 144.89
Total CHOL/HDL Ratio: 4
Triglycerides: 121 mg/dL (ref 0.0–149.0)
VLDL: 24.2 mg/dL (ref 0.0–40.0)

## 2021-02-05 LAB — HEMOGLOBIN A1C: Hgb A1c MFr Bld: 5.8 % (ref 4.6–6.5)

## 2021-02-10 ENCOUNTER — Telehealth: Payer: Self-pay | Admitting: Gastroenterology

## 2021-02-10 NOTE — Telephone Encounter (Signed)
Hi Dr. Loletha Carrow,   This patient is requesting a transfer of care over to a female provider and has requested Dr. Silverio Decamp.   Please advise on approval for scheduling.   Thank you

## 2021-02-11 NOTE — Telephone Encounter (Signed)
Of course, if that is the patient's preference.  - HD

## 2021-02-11 NOTE — Telephone Encounter (Signed)
Hi Dr. Silverio Decamp,  Would you be ok with this transfer of care?

## 2021-02-13 ENCOUNTER — Inpatient Hospital Stay: Payer: Medicare Other

## 2021-02-13 ENCOUNTER — Inpatient Hospital Stay: Payer: Medicare Other | Attending: Hematology & Oncology

## 2021-02-13 ENCOUNTER — Other Ambulatory Visit: Payer: Self-pay

## 2021-02-13 LAB — CMP (CANCER CENTER ONLY)
ALT: 9 U/L (ref 0–44)
AST: 30 U/L (ref 15–41)
Albumin: 3.8 g/dL (ref 3.5–5.0)
Alkaline Phosphatase: 207 U/L — ABNORMAL HIGH (ref 38–126)
Anion gap: 11 (ref 5–15)
BUN: 13 mg/dL (ref 8–23)
CO2: 24 mmol/L (ref 22–32)
Calcium: 9.1 mg/dL (ref 8.9–10.3)
Chloride: 104 mmol/L (ref 98–111)
Creatinine: 0.74 mg/dL (ref 0.44–1.00)
GFR, Estimated: >60 mL/min (ref >60)
Glucose, Bld: 127 mg/dL — ABNORMAL HIGH (ref 70–99)
Potassium: 3.9 mmol/L (ref 3.5–5.1)
Sodium: 139 mmol/L (ref 135–145)
Total Bilirubin: 1 mg/dL (ref 0.3–1.2)
Total Protein: 6.5 g/dL (ref 6.5–8.1)

## 2021-02-13 LAB — CBC WITH DIFFERENTIAL (CANCER CENTER ONLY)
Abs Immature Granulocytes: 0.01 10*3/uL (ref 0.00–0.07)
Basophils Absolute: 0 10*3/uL (ref 0.0–0.1)
Basophils Relative: 1 %
Eosinophils Absolute: 0.1 10*3/uL (ref 0.0–0.5)
Eosinophils Relative: 2 %
HCT: 45.4 % (ref 36.0–46.0)
Hemoglobin: 15.4 g/dL — ABNORMAL HIGH (ref 12.0–15.0)
Immature Granulocytes: 0 %
Lymphocytes Relative: 31 %
Lymphs Abs: 2.1 10*3/uL (ref 0.7–4.0)
MCH: 30.6 pg (ref 26.0–34.0)
MCHC: 33.9 g/dL (ref 30.0–36.0)
MCV: 90.3 fL (ref 80.0–100.0)
Monocytes Absolute: 0.7 10*3/uL (ref 0.1–1.0)
Monocytes Relative: 10 %
Neutro Abs: 3.8 10*3/uL (ref 1.7–7.7)
Neutrophils Relative %: 56 %
Platelet Count: 246 10*3/uL (ref 150–400)
RBC: 5.03 MIL/uL (ref 3.87–5.11)
RDW: 12.8 % (ref 11.5–15.5)
WBC Count: 6.8 10*3/uL (ref 4.0–10.5)
nRBC: 0 % (ref 0.0–0.2)

## 2021-02-13 LAB — IRON AND TIBC
Iron: 182 ug/dL — ABNORMAL HIGH (ref 41–142)
Saturation Ratios: 68 % — ABNORMAL HIGH (ref 21–57)
TIBC: 268 ug/dL (ref 236–444)
UIBC: 85 ug/dL — ABNORMAL LOW (ref 120–384)

## 2021-02-13 LAB — FERRITIN: Ferritin: 343 ng/mL — ABNORMAL HIGH (ref 11–307)

## 2021-02-13 NOTE — Progress Notes (Signed)
[name] presents today for phlebotomy per MD orders. Phlebotomy procedure started at 1328 and ended at 1338. 500 grams removed. Patient observed for 30 minutes after procedure without any incident. Snack provided.  Patient tolerated procedure well. IV needle removed intact.

## 2021-02-13 NOTE — Patient Instructions (Signed)
Therapeutic Phlebotomy, Care After This sheet gives you information about how to care for yourself after your procedure. Your health care provider may also give you more specific instructions. If you have problems or questions, contact your health care provider. What can I expect after the procedure? After the procedure, it is common to have:  Light-headedness or dizziness. You may feel faint.  Nausea.  Tiredness (fatigue). Follow these instructions at home: Eating and drinking  Be sure to eat well-balanced meals for the next 24 hours.  Drink enough fluid to keep your urine pale yellow.  Avoid drinking alcohol on the day that you had the procedure. Activity  Return to your normal activities as told by your health care provider. Most people can go back to their normal activities right away.  Avoid activities that take a lot of effort for about 5 hours after the procedure. Athletes should avoid strenuous exercise for at least 12 hours.  Avoid heavy lifting or pulling for about 5 hours after the procedure. Do not lift anything that is heavier than 10 lb (4.5 kg).  Change positions slowly for the remainder of the day. This will help to prevent light-headedness or fainting.  If you feel light-headed, lie down until the feeling goes away.   Needle insertion site care  Keep your bandage (dressing) dry. You can remove the bandage after about 5 hours or as told by your health care provider.  If you have bleeding from the needle insertion site, raise (elevate) your arm and press firmly on the site until the bleeding stops.  If you have bruising at the site, apply ice to the area: ? Remove the dressing. ? Put ice in a plastic bag. ? Place a towel between your skin and the bag. ? Leave the ice on for 20 minutes, 2-3 times a day for the first 24 hours.  If the swelling does not go away after 24 hours, apply a warm, moist cloth (warm compress) to the area for 20 minutes, 2-3 times a day.    General instructions  Do not use any products that contain nicotine or tobacco, such as cigarettes and e-cigarettes, for at least 30 minutes after the procedure.  Keep all follow-up visits as told by your health care provider. This is important. You may need to continue having regular therapeutic phlebotomy treatments as directed. Contact a health care provider if you:  Have redness, swelling, or pain at the needle insertion site.  Have fluid or blood coming from the needle insertion site.  Have pus or a bad smell coming from the needle insertion site.  Notice that the needle insertion site feels warm to the touch.  Feel light-headed, dizzy, or nauseous, and the feeling does not go away.  Have new bruising at the needle insertion site.  Feel weaker than normal.  Have a fever or chills. Get help right away if:  You faint.  You have chest pain.  You have trouble breathing.  You have severe nausea or vomiting. Summary  After the procedure, it is common to have some light-headedness, dizziness, nausea, or tiredness (fatigue).  Be sure to eat well-balanced meals for the next 24 hours. Drink enough fluid to keep your urine pale yellow.  Return to your normal activities as told by your health care provider.  Keep all follow-up visits as told by your health care provider. You may need to continue having regular therapeutic phlebotomy treatments as directed. This information is not intended to replace advice given  to you by your health care provider. Make sure you discuss any questions you have with your health care provider. Document Revised: 11/05/2017 Document Reviewed: 11/04/2017 Elsevier Patient Education  2021 [employer]. Therapeutic Phlebotomy Discharge Instructions  - Increase your fluid intake over the next 4 hours  - No smoking for 30 minutes  - Avoid using the affected arm (the one you had the blood drawn from) for heavy lifting or other activities.  - You  may resume all normal activities after 30 minutes.  You are to notify the office if you experience:   - Persistent dizziness and/or lightheadedness -Uncontrolled or excessive bleeding at the site.

## 2021-02-14 MED ORDER — PEGFILGRASTIM-BMEZ 6 MG/0.6ML ~~LOC~~ SOSY
PREFILLED_SYRINGE | SUBCUTANEOUS | Status: AC
  Filled 2021-02-14: qty 0.6

## 2021-02-17 NOTE — Telephone Encounter (Signed)
Its fine, thanks 

## 2021-02-18 NOTE — Telephone Encounter (Signed)
Thank you both.  Patient requested soonest appt; scheduled for 02/21/21 with an APP.

## 2021-02-19 ENCOUNTER — Other Ambulatory Visit: Payer: Self-pay

## 2021-02-19 ENCOUNTER — Ambulatory Visit (INDEPENDENT_AMBULATORY_CARE_PROVIDER_SITE_OTHER): Payer: Medicare Other | Admitting: Otolaryngology

## 2021-02-19 ENCOUNTER — Encounter (INDEPENDENT_AMBULATORY_CARE_PROVIDER_SITE_OTHER): Payer: Self-pay | Admitting: Otolaryngology

## 2021-02-19 VITALS — Temp 97.0°F

## 2021-02-19 DIAGNOSIS — R42 Dizziness and giddiness: Secondary | ICD-10-CM

## 2021-02-19 DIAGNOSIS — H8110 Benign paroxysmal vertigo, unspecified ear: Secondary | ICD-10-CM | POA: Diagnosis not present

## 2021-02-19 NOTE — Progress Notes (Signed)
HPI: Maria [name] is a 85 y.o. female who presents is referred by Raiford Noble, PA-C for evaluation of vertigo.  Patient used to have vertigo years ago.  But then in November she initially developed a bad episode of vertigo while at home in the morning that lasted for couple of minutes and was associated with nausea and vomiting and feeling a little imbalance the rest of the day.  She had another episode of vertigo while doing yoga that lasted for about a minute.  Is subsequently subsided and she was able to drive home.  She has a little imbalance" fuzzy headed" since the bad episodes of vertigo..  She had an MRI scan performed a month ago that did not show any acute findings.  She did have some microvascular disease as would be expected.  She also has chronic neck problems with cervical stenosis. She is doing reasonably well today. She does not complain of any hearing problems.  Past Medical History:  Diagnosis Date  . Cataract   . Complication of anesthesia    slow to awaken  . Depression   . Diverticulosis   . Goiter, nodular    [place] of thyroid stable 7/08  . Headache    hx of  . Heart murmur    due to rheumatic fever as child  . Hemochromatosis, hereditary ([place]) 09/01/2019  . Mitral valve prolapse   . Multiple thyroid nodules 11/24/2015  . Neoplasm of lung, malignant ([place]) 2002   Non-small cell surgical tx  . Osteoarthritis 11/14/2013   2010 intra-articular steroids X 3 S/P Physical Therapy in 12/14 TKR 12/11/13   . Osteopenia    Dr Pamala Hurry  . PONV (postoperative nausea and vomiting)    n/v  . Sleep apnea   . Vitamin D deficiency    Past Surgical History:  Procedure Laterality Date  . ABDOMINAL HYSTERECTOMY  1998   No BSO, for dysfunctional menses  . BLADDER SUSPENSION Bilateral   . BREAST BIOPSY Right   . COLONOSCOPY  2009 , 2014   Diverticulosis; Dr. Olevia Perches  . LOBECTOMY  12/02   RU; no radiation or chemo, Dr. Arlyce Dice  . TONSILLECTOMY    . TOTAL KNEE ARTHROPLASTY  Right 12/11/2013   Procedure: RIGHT TOTAL KNEE ARTHROPLASTY;  Surgeon: Gearlean Alf, MD;  Location: [facility];  Service: Orthopedics;  Laterality: Right;  . TOTAL KNEE ARTHROPLASTY Left 06/28/2017   Procedure: LEFT TOTAL KNEE ARTHROPLASTY;  Surgeon: Gaynelle Arabian, MD;  Location: [facility];  Service: Orthopedics;  Laterality: Left;  Adductor Block   Social History   Socioeconomic History  . Marital status: Divorced    Spouse name: Not on file  . Number of children: Not on file  . Years of education: college  . Highest education level: Not on file  Occupational History  . Occupation: Retired    Fish farm manager: RETIRED  Tobacco Use  . Smoking status: Former Smoker    Years: 20.00    Types: Cigarettes    Quit date: 05/03/1979    Years since quitting: 41.8  . Smokeless tobacco: Never Used  . Tobacco comment: smoked 1962-1980, up to < 1 ppd  Vaping Use  . Vaping Use: Never used  Substance and Sexual Activity  . Alcohol use: Yes    Alcohol/week: 7.0 standard drinks    Types: 7 Standard drinks or equivalent per week    Comment: occasional  . Drug use: No  . Sexual activity: Not Currently    Comment: lives alone, dog,  no dietary restrictions, eating heart healthy  Other Topics Concern  . Not on file  Social History Narrative   Pt gets regular exercise.   Drinks 2 cups of coffee a day    Social Determinants of Health   Financial Resource Strain: Low Risk   . Difficulty of Paying Living Expenses: Not hard at all  Food Insecurity: No Food Insecurity  . Worried About Charity fundraiser in the Last Year: Never true  . Ran Out of Food in the Last Year: Never true  Transportation Needs: No Transportation Needs  . Lack of Transportation (Medical): No  . Lack of Transportation (Non-Medical): No  Physical Activity: Not on file  Stress: Not on file  Social Connections: Not on file   Family History  Problem Relation Age of Onset  . COPD Mother   . Coronary artery disease Father   . Colon  cancer Father 12  . Heart attack Father 34  . Stroke Father        > 80  . Lung cancer Paternal Aunt        smoker  . Cancer Paternal Aunt        lung  . Lung cancer Maternal Grandfather        smoker  . COPD Maternal Grandfather   . Arthritis Sister   . Lung cancer Maternal Uncle        smoker  . Diabetes Neg Hx    No Known Allergies Prior to Admission medications   Medication Sig Start Date End Date Taking? Authorizing Provider  acetaminophen (TYLENOL) 500 MG tablet Take 500 mg by mouth every 6 (six) hours as needed.    [provider]  COVID-19 mRNA vaccine, Moderna, 100 MCG/0.5ML injection INJECT AS DIRECTED 10/04/20 10/04/21  Carlyle Basques, MD  DULoxetine (CYMBALTA) 30 MG capsule Take 1 capsule (30 mg total) by mouth daily. 10/09/20   Brunetta Jeans, PA-C     Positive ROS: Otherwise negative  All other systems have been reviewed and were otherwise negative with the exception of those mentioned in the HPI and as above.  Physical Exam: Constitutional: Alert, well-appearing, no acute distress Ears: External ears without lesions or tenderness. Ear canals are clear bilaterally with intact, clear TMs bilaterally.  On Dix-Hallpike testing she had no clinical evidence of BPPV in the office today.  On hearing screening with a 512 and 1024 tuning fork she heard well in both ears with no significant hearing loss. Nasal: External nose without lesions. Septum relatively midline.. Clear nasal passages bilaterally. Oral: Lips and gums without lesions. Tongue and palate mucosa without lesions. Posterior oropharynx clear. Neck: No palpable adenopathy or masses Respiratory: Breathing comfortably  Skin: No facial/neck lesions or rash noted.  Procedures  Assessment: It sounds like she might of had an episode of BPPV.  She has no clinical evidence presently of any significant inner ear abnormality such as Mnire's disease. Some of her imbalance may be related to the cervical  stenosis as well as age.  Plan: Reviewed with her concerning BPPV and gave her information on BPPV as well as treatment with the Epley maneuver if she has episodes of vertigo.  I also reviewed with her that cervical stenosis could contribute some to her imbalance as well as her age. I encouraged her to continue with the yoga. If she continues to have repeated bouts of the vertigo she will call us back to schedule VNG testing if symptoms do not improve with the Epley maneuver.  Radene Journey, MD   CC:

## 2021-02-21 ENCOUNTER — Ambulatory Visit: Payer: Medicare Other | Admitting: Physician Assistant

## 2021-02-21 ENCOUNTER — Encounter: Payer: Self-pay | Admitting: Physician Assistant

## 2021-02-21 VITALS — BP 126/80 | HR 96 | Ht 65.0 in | Wt 165.2 lb

## 2021-02-21 DIAGNOSIS — R634 Abnormal weight loss: Secondary | ICD-10-CM | POA: Diagnosis not present

## 2021-02-21 DIAGNOSIS — R1031 Right lower quadrant pain: Secondary | ICD-10-CM

## 2021-02-21 DIAGNOSIS — R110 Nausea: Secondary | ICD-10-CM | POA: Diagnosis not present

## 2021-02-21 DIAGNOSIS — R1032 Left lower quadrant pain: Secondary | ICD-10-CM

## 2021-02-21 DIAGNOSIS — K5900 Constipation, unspecified: Secondary | ICD-10-CM | POA: Diagnosis not present

## 2021-02-21 NOTE — Progress Notes (Signed)
Subjective:    Patient ID: Maria [name], female    DOB: [birth year], 85 y.o.   MRN: [id number]  HPI Maria [name] is a pleasant 85 year old white female, previously established with Dr. Loletha Carrow who had just requested change to female/Dr. Silverio Decamp.  She comes in today with concerns about her bowel habits, and recent nausea/queasiness. She has history of hypertension, remote history of non-small cell lung cancer, sleep apnea, diverticulosis and osteoarthritis.  She is also followed by hematology for hemochromatosis. She had undergone colonoscopy in August 2021 per Dr. Rachael Darby with finding of multiple left colon diverticuli with associated tortuosity and a fair prep. She says she has been having difficulty with her bowels over the past 3 years.  Prior to that she would take Metamucil on a daily basis and never had any difficulty.  He said the Metamucil stopped working, she is now trying Citrucel which she says has been somewhat helpful.  She had tried MiraLAX in the past which she says makes her stools to mushy or soft. Most days she is having small pellet-like stools and generally is able to have at least a small bowel movement every day.  She has not noted any melena or hematochezia.  She says she has been having some intermittent partial incontinence usually while walking with passage of a small amount of syllabic stool. She also says she has lost about 8 pounds over the past 3 months and has had a decrease in appetite.  She has a fairly persistent queasy sensation in her abdomen, and says she has not been as hungry.  She is also had some episodes of nausea that were associated with dizziness or vertiginous symptoms but those were more positional related to attempts at doing yoga recently.  She has not been started on any new medications.   Review of Systems Pertinent positive and negative review of systems were noted in the above HPI section.  All other review of systems was otherwise  negative.  Outpatient Encounter Medications as of 02/21/2021  Medication Sig  . acetaminophen (TYLENOL) 500 MG tablet Take 500 mg by mouth every 6 (six) hours as needed.  . Cholecalciferol (VITAMIN D3 PO) Take 1 tablet by mouth daily.  . DULoxetine (CYMBALTA) 30 MG capsule Take 1 capsule (30 mg total) by mouth daily.  . [DISCONTINUED] COVID-19 mRNA vaccine, Moderna, 100 MCG/0.5ML injection INJECT AS DIRECTED   No facility-administered encounter medications on file as of 02/21/2021.   No Known Allergies Patient Active Problem List   Diagnosis Date Noted  . Degenerative cervical spinal stenosis 02/03/2021  . Routine general medical examination at a health care facility 02/03/2021  . Weakness of right hand 12/31/2020  . Arthritis of midfoot 06/10/2020  . HTN (hypertension) 11/08/2019  . Hemochromatosis 09/06/2019  . Preventative health care 06/16/2016  . OSA (obstructive sleep apnea) 06/15/2016  . Depression 05/14/2016  . Multiple thyroid nodules 11/24/2015  . Degenerative arthritis of lumbar spine 09/03/2014  . OA (osteoarthritis) of knee 12/11/2013  . Osteoarthritis 11/14/2013  . BENIGN POSITIONAL VERTIGO 01/17/2010  . Urinary incontinence 04/26/2009  . Hyperlipidemia, mixed 02/04/2009  . Vitamin D deficiency 10/23/2008  . Osteoporosis 10/23/2008  . NEOPLASM, MALIGNANT, LUNG, NON-SMALL CELL 11/21/2007  . DIVERTICULOSIS 06/15/2007  . GOITER, NODULAR 04/14/2007   Social History   Socioeconomic History  . Marital status: Divorced    Spouse name: Not on file  . Number of children: Not on file  . Years of education: college  . Highest education level: Not  on file  Occupational History  . Occupation: Retired    Fish farm manager: RETIRED  Tobacco Use  . Smoking status: Former Smoker    Years: 20.00    Types: Cigarettes    Quit date: 05/03/1979    Years since quitting: 41.8  . Smokeless tobacco: Never Used  . Tobacco comment: smoked 1962-1980, up to < 1 ppd  Vaping Use  . Vaping  Use: Never used  Substance and Sexual Activity  . Alcohol use: Yes    Alcohol/week: 7.0 standard drinks    Types: 7 Standard drinks or equivalent per week    Comment: occasional  . Drug use: No  . Sexual activity: Not Currently    Comment: lives alone, dog, no dietary restrictions, eating heart healthy  Other Topics Concern  . Not on file  Social History Narrative   Pt gets regular exercise.   Drinks 2 cups of coffee a day    Social Determinants of Health   Financial Resource Strain: Low Risk   . Difficulty of Paying Living Expenses: Not hard at all  Food Insecurity: No Food Insecurity  . Worried About Charity fundraiser in the Last Year: Never true  . Ran Out of Food in the Last Year: Never true  Transportation Needs: No Transportation Needs  . Lack of Transportation (Medical): No  . Lack of Transportation (Non-Medical): No  Physical Activity: Not on file  Stress: Not on file  Social Connections: Not on file  Intimate Partner Violence: Not on file    Maria [name]'s family history includes Arthritis in her sister; COPD in her maternal grandfather and mother; Cancer in her paternal aunt; Colon cancer (age of onset: 21) in her father; Coronary artery disease in her father; Heart attack (age of onset: 24) in her father; Lung cancer in her maternal grandfather, maternal uncle, and paternal aunt; Stroke in her father.      Objective:    Vitals:   02/21/21 1021  BP: 126/80  Pulse: 96    Physical Exam Well-developed well-nourished elderly white female in no acute distress.  Height, Weight, 165 BMI 27.5  HEENT; nontraumatic normocephalic, EOMI, PE R LA, sclera anicteric. Oropharynx; not examined today Neck; supple, no JVD Cardiovascular; regular rate and rhythm with S1-S2, no murmur rub or gallop Pulmonary; Clear bilaterally Abdomen; soft, some mild bilateral lower quadrant tenderness and rounded fullness in the pelvic area most consistent with distended bladder, , no  palpable mass or hepatosplenomegaly, bowel sounds are active Rectal; not done today Skin; benign exam, no jaundice rash or appreciable lesions Extremities; no clubbing cyanosis or edema skin warm and dry Neuro/Psych; alert and oriented x4, grossly nonfocal mood and affect appropriate       Assessment & Plan:   #64 85 year old white female with chronic mild constipation, and occasional partial fecal seepage/incontinence  #2  Significant left colon diverticulosis with tortuosity #3 recent nausea/queasiness over the past 3 months and associated 8 pound weight loss Etiology not clear, she has some mild tenderness bilateral lower quadrants on exam, will rule out neoplasm, rule out intra-abdominal inflammatory process  #4 remote history of non-small cell lung cancer he 5.  Hypertension 6.  Sleep apnea 7.  Osteoarthritis  Plan; recent labs from 4/14 reviewed, including iron studies which were unremarkable Patient will be scheduled for CT scan of the abdomen and pelvis with contrast We discussed importance of liberal water intake with at least 60 ounces of water per day. She will start trial of Benefiber 2  scoops and 1 glass of water daily Also start prune juice 1 glass/day or 4-5 prunes per day.  Offered an antiemetic but she does not feel that she is so nauseated that she would use this at present. Further recommendations pending findings of CT.   Jamaia Brum Genia Harold PA-C 02/21/2021   Cc: Janith Lima, MD

## 2021-02-21 NOTE — Patient Instructions (Addendum)
If you are age 83 or older, your body mass index should be between 23-30. Your Body mass index is 27.5 kg/m. If this is out of the aforementioned range listed, please consider follow up with your Primary Care Provider.  If you are age 22 or younger, your body mass index should be between 19-25. Your Body mass index is 27.5 kg/m. If this is out of the aformentioned range listed, please consider follow up with your Primary Care Provider.   You have been scheduled for a CT scan of the abdomen and pelvis at [facility], 1st floor Radiology. You are scheduled on 02/28/21  at 7:30. You should arrive 15 minutes prior to your appointment time for registration.  Please pick up 2 bottles of contrast from [place] at least 3 days prior to your scan. The solution may taste better if refrigerated, but do NOT add ice or any other liquid to this solution. Shake well before drinking.   Please follow the written instructions below on the day of your exam:   1) Do not eat anything after (4 hours prior to your test)   2) Drink 1 bottle of contrast @ 5:30 am (2 hours prior to your exam)  Remember to shake well before drinking and do NOT pour over ice.     Drink 1 bottle of contrast @ 6:30 am (1 hour prior to your exam)   You may take any medications as prescribed with a small amount of water, if necessary. If you take any of the following medications: METFORMIN, GLUCOPHAGE, GLUCOVANCE, AVANDAMET, RIOMET, FORTAMET, [employer] MET, JANUMET, GLUMETZA or METAGLIP, you MAY be asked to HOLD this medication 48 hours AFTER the exam.   The purpose of you drinking the oral contrast is to aid in the visualization of your intestinal tract. The contrast solution may cause some diarrhea. Depending on your individual set of symptoms, you may also receive an intravenous injection of x-ray contrast/dye. Plan on being at [facility] for 45 minutes or longer, depending on the type of exam you are having performed.   If you  have any questions regarding your exam or if you need to reschedule, you may call [name] at [phone] between the hours of 8:00 am and 5:00 pm, Monday-Friday.   Start Benefiber in 8 ounces of liquid daily as directed.  Start prune juice daily.  Drink at least 60 ounces of water daily.  Follow up pending the results of your CT or as needed.  Thank you for entrusting me with your care and choosing [facility].  Amy Esterwood, PA-C

## 2021-02-24 NOTE — Progress Notes (Signed)
Reviewed and agree with documentation and assessment and plan. K. Veena Jabari Swoveland , MD   

## 2021-02-28 ENCOUNTER — Encounter (HOSPITAL_COMMUNITY): Payer: Self-pay

## 2021-02-28 ENCOUNTER — Ambulatory Visit (HOSPITAL_COMMUNITY)
Admission: RE | Admit: 2021-02-28 | Discharge: 2021-02-28 | Disposition: A | Discharge status: Home / Self Care | Payer: Medicare Other | Source: Ambulatory Visit | Attending: Physician Assistant | Admitting: Physician Assistant

## 2021-02-28 ENCOUNTER — Other Ambulatory Visit: Payer: Self-pay

## 2021-02-28 DIAGNOSIS — R110 Nausea: Secondary | ICD-10-CM | POA: Insufficient documentation

## 2021-02-28 DIAGNOSIS — R1031 Right lower quadrant pain: Secondary | ICD-10-CM

## 2021-02-28 DIAGNOSIS — R634 Abnormal weight loss: Secondary | ICD-10-CM

## 2021-02-28 DIAGNOSIS — K5900 Constipation, unspecified: Secondary | ICD-10-CM

## 2021-02-28 DIAGNOSIS — R1032 Left lower quadrant pain: Secondary | ICD-10-CM | POA: Insufficient documentation

## 2021-02-28 MED ORDER — IOHEXOL 300 MG/ML  SOLN
100.0000 mL | Freq: Once | INTRAMUSCULAR | Status: AC | PRN
  Administered 2021-02-28: 100 mL via INTRAVENOUS

## 2021-03-01 ENCOUNTER — Other Ambulatory Visit: Payer: Self-pay | Admitting: Family Medicine

## 2021-03-03 ENCOUNTER — Telehealth: Payer: Self-pay | Admitting: Physician Assistant

## 2021-03-03 NOTE — Telephone Encounter (Signed)
Discussed with Nicoletta Ba, and spoke with the patient. See the CT imaging report for full details. Patient referred to [facility] per her preference.

## 2021-03-03 NOTE — Telephone Encounter (Signed)
Inbound call from patient. She first wanted to say thank you to Amy for recommending the scan. Second, she asked if she can send a referral with the CT scan to [facility]. States they would like to get her in as soon as possible. Fax [phone] attention [place].

## 2021-03-10 ENCOUNTER — Other Ambulatory Visit: Payer: Medicare Other

## 2021-03-10 ENCOUNTER — Telehealth: Payer: Self-pay | Admitting: Neurology

## 2021-03-10 ENCOUNTER — Ambulatory Visit: Payer: Medicare Other | Admitting: Family

## 2021-03-10 NOTE — Telephone Encounter (Signed)
I have reached out to the pt via mychart message advising we can see her on 03/17/21 at 54 am. Pt asked to message back if this works for her.

## 2021-03-10 NOTE — Telephone Encounter (Signed)
Pt called, having colon cancer surgery and would like to take care of my appt for my CPAP supplies. Would like a call from the nurse to discuss a sooner appt than 5/23.

## 2021-03-17 ENCOUNTER — Ambulatory Visit: Payer: Self-pay | Admitting: Adult Health

## 2021-03-24 ENCOUNTER — Ambulatory Visit: Payer: Medicare Other | Admitting: Neurology

## 2021-03-26 ENCOUNTER — Telehealth: Payer: Self-pay | Admitting: Internal Medicine

## 2021-03-26 NOTE — Telephone Encounter (Signed)
Verbal orders given via VM 

## 2021-03-26 NOTE — Telephone Encounter (Signed)
Maria [name] [phone] is requesting PT verbals for 1w2, 2w2, 1w1. Please advise   RVIFB:[phone]

## 2021-04-07 ENCOUNTER — Telehealth: Payer: Self-pay | Admitting: Internal Medicine

## 2021-04-07 NOTE — Telephone Encounter (Signed)
LVM for pt to rtn my call to schedule AWV with [facility]. Please schedule AWV if pt calls the office  

## 2021-04-09 ENCOUNTER — Ambulatory Visit: Payer: Medicare Other | Admitting: Neurology

## 2021-04-13 ENCOUNTER — Encounter: Payer: Self-pay | Admitting: Neurology

## 2021-04-14 ENCOUNTER — Encounter: Payer: Self-pay | Admitting: Hematology

## 2021-04-15 ENCOUNTER — Other Ambulatory Visit: Payer: Self-pay

## 2021-04-15 ENCOUNTER — Ambulatory Visit: Payer: Medicare Other | Admitting: Neurology

## 2021-04-15 ENCOUNTER — Encounter: Payer: Self-pay | Admitting: Neurology

## 2021-04-15 VITALS — BP 151/97 | HR 93 | Ht 66.0 in | Wt 156.0 lb

## 2021-04-15 DIAGNOSIS — G4733 Obstructive sleep apnea (adult) (pediatric): Secondary | ICD-10-CM

## 2021-04-15 DIAGNOSIS — Z9989 Dependence on other enabling machines and devices: Secondary | ICD-10-CM | POA: Diagnosis not present

## 2021-04-15 NOTE — Patient Instructions (Signed)
Verbal AVS given: FU 1 year, continue CPAP at current settings, supplies UTD.

## 2021-04-15 NOTE — Progress Notes (Signed)
Subjective:    Patient ID: [name] is a 85 y.o. female.  HPI    Interim history:   [name] is a very pleasant 85 year old right-handed lady with an underlying medical history non-small cell lung cancer, status post lobectomy in 2002, hypertension, depression, thyroid nodules, arthritis of both knees, status post right total knee arthroplasty, hyponatremia, hypokalemia, hyperlipidemia, vitamin D deficiency, osteoporosis, hemochromatosis, and ovarian cancer with recent s/p surgery in May 2022, who presents for follow-up consultation of her sleep apnea, established on CPAP therapy. The patient is unaccompanied today and presents for her yearly checkup. I last saw her on 03/21/2020, at which time she was compliant with her CPAP and doing well with it. She was recently diagnosed with hemochromatosis and needed regular phlebotomies.  She had seen doctor Hulan Saas for her back pain and also a chiropractor.  She had acupuncture done as well.  Today, 04/15/2020: I reviewed her CPAP compliance data from 03/15/2021 through 04/13/2021, which is a total of 30 days, during which time she used her machine 26 days with percent use days greater than 4 hours at 83%, indicating very good compliance with an average usage of 6 hours and 56 minutes, residual AHI at goal at 1.1/h, leak on the higher side with a 95th percentile at 19.8 L/min on a pressure of 7 cm with EPR of 3.  She reports doing well with her CPAP.  She is recovering from her recent surgery, she has a scan pending for later this month and may start a trial at [name].  She will be on standard chemotherapy and also a study drug versus placebo, it is a double-blind study and she is hoping that she can participate.  She was diagnosed in the interim with a pelvic mass and had recent surgery for ovarian cancer she is followed by GYN oncology and also by hematology. She drove to [place] last year but has decided that she will not make a road trip  again, she will take the plane.  She was supposed to stay in [place] at an air B&B this August but had to postpone this because of her cancer treatment.  She is in good spirits.   The patient's allergies, current medications, family history, past medical history, past social history, past surgical history and problem list were reviewed and updated as appropriate.    Previously (copied from previous notes for reference):      I saw her on 03/21/19, At which time she was compliant with her CPAP and doing well.  She had ongoing issues with left knee pain and lower back pain as well as hip pain.  She has been going to restart physical therapy.  She was advised to follow-up routinely in 1 year for sleep apnea.   I reviewed her CPAP compliance data from 02/19/2020 through 03/19/2020, which is a total of 30 days, during which time she used her machine 27 days with percent use days greater than 4 hours at 90%, indicating excellent compliance with an average usage of 7 hours and 46 minutes for days on treatment, residual AHI at goal at 1.9/h, leak on the higher side with a 95th percentile at 19.5 L/min on a pressure of 7 cm with EPR of 3.     I saw her on 03/16/2018, at which time she had some lapses in treatment, she was housesitting for a friend in [place].  She had a total knee replacement on the left in August 2018.  She was traveling to [place].     I reviewed her CPAP compliance data from 02/18/2019 through 03/19/2019 which is a total of 30 days, during which time she used her machine every night with percent used days greater than 4 hours at 93%, indicating excellent compliance with an average usage of 6 hours and 55 minutes, residual AHI at goal at 1.6/h, leak acceptable with a 95th percentile at 12.7 L/min on a pressure of 7 cm with EPR of 3.     I saw her on 02/11/2017, at which time she reported doing well with CPAP, she was sleeping well with it. She was complaining of left knee pain, she was  status post right total knee replacement.   I reviewed her CPAP compliance data from the last 30 days from 02/12/2018 through 03/13/2018 which is 30 days, during which time she used her CPAP 24 days with percent used days greater than 4 hours at 63%, indicating suboptimal compliance with an average usage of 6 hours and 36 minutes 4 days on treatment, residual AHI at goal at 1.2 per hour, leak acceptable with the 95th percentile at 13.6 L/m on a pressure of 7 cm with EPR of 3. In the past 90 days she has had a 4 hour or more compliant percentage of 70%.   I saw her on 08/12/2016, at which time we talked about her sleep study results from her baseline sleep study from 05/18/2016 as well as her CPAP titration results from 06/05/2016. She was established on CPAP therapy and compliant with it. She reported that her regarding her sleep with more restful sleep, better sleep consolidation. She was planning to take her CPAP machine to her trip to [place]. Stays busy, particularly with her young grandchildren, her older son's kids (from his second marriage). She has 2 sons (in their 30s).   I reviewed her CPAP compliance data from 01/10/2017 through 02/08/2017, which is a total of 30 days, during which time she used her machine 29 days with percent used days greater than 4 hours at 93%, indicating excellent compliance with an average usage of 6 hours and 21 minutes, residual AHI 1.8 per hour, leak on the higher side for the 95th percentile at 23 L/m, pressure of 7 cm with EPR of 3.   I first met her on 04/28/2016 at the request of her primary care physician, at which time she reported snoring and excessive daytime somnolence, morning headaches, sleep disruption and nonrestorative sleep. I invited her back for sleep study. She had a baseline sleep study, followed by a CPAP titration study. Baseline sleep study from 05/18/2016 showed a sleep efficiency of 70.9%, latency to sleep was prolonged at 26 minutes and wake  after sleep onset was increased at 91 minutes. She had an increased percentage of light stage sleep, slow-wave sleep was 30.5% and there was no REM sleep. She had occasional PVCs on EKG. She had mild snoring. Total AHI was elevated at 28.7 per hour. Average oxygen saturation was only 89%, she was treated with supplemental oxygen which was started and 11:55 PM. Based on her sleep-related complaints and her test results I invited her for a full night CPAP titration study.    She had her CPAP study on 06/05/2016. Sleep efficiency was 75.9%, sleep latency was 26 minutes and wake after sleep onset was 71.5 minutes. She had an increased percentage of slow-wave sleep and REM sleep was 6.4%, REM latency markedly prolonged at 226.5 minutes. She had no  significant PLMS. CPAP was titrated from 5 cm to 7 cm. AHI was 0 per hour at the final pressure. Average oxygen saturation was 92%, nadir was 87%. Time below 88% saturation was less than 1 minute. Based on her test results are prescribed CPAP therapy for home use.   I reviewed her CPAP compliance data from 07/12/2016 through 08/10/2016 which is a total of 30 days, during which time she used her machine 29 days with percent used days greater than 4 hours at 93%, indicating excellent compliance with an average usage of 5 hours and 55 minutes, residual AHI 2.3 per hour, leak on the higher side with the 95th percentile at 22.4 L/m on a pressure of 7 cm with EPR of 3.   04/28/2016: She reports snoring, excessive daytime somnolence, nonrestorative sleep, morning headaches and sleep disruption. I reviewed your office note from 04/15/2016. She is s/p R TKA in 2015 and has L knee pain, s/p multiple knee injection, sees Dr. Maureen Ralphs. She goes to bed around 9:30 to 10 PM. She has occasional morning headaches, not enough to take medication, but BP has been a little up in the AMs. She denies restless leg symptoms. She has a rise time around 5 AM. She goes to the bathroom once or twice  per average night. She lives alone. She is divorced for about 41 years. She is retired from [employer]. She has 2 grown children and 6 grandchildren. She likes to travel. She stays very active, walks about 2 miles per day. She drinks alcohol occasionally, usually 2 cups of coffee per day, quit smoking in 1980. She recently started Cymbalta 30 mg once daily about 2 weeks ago and feels improved in her mood. She has no TV in the bedroom. She has a small dog sleeps at the end of the bed in a queen size bed. She is not aware of any family history of obstructive sleep apnea. Her main concern is that she is tired during the day and does not wake up rested. Her Epworth sleepiness score is 12 out of 24 today, her fatigue score is 33 out of 63.  Her Past Medical History Is Significant For: Past Medical History:  Diagnosis Date   Cataract    Complication of anesthesia    slow to awaken   Depression    Diverticulosis    Goiter, nodular    [place] of thyroid stable 7/08   Headache    hx of   Heart murmur    due to rheumatic fever as child   Hemochromatosis, hereditary ([facility]) 09/01/2019   Mitral valve prolapse    Multiple thyroid nodules 11/24/2015   Neoplasm of lung, malignant ([place]) 2002   Non-small cell surgical tx   Osteoarthritis 11/14/2013   2010 intra-articular steroids X 3 S/P Physical Therapy in 12/14 TKR 12/11/13    Osteopenia    Dr Pamala Hurry   PONV (postoperative nausea and vomiting)    n/v   Sleep apnea    Vitamin D deficiency     Her Past Surgical History Is Significant For: Past Surgical History:  Procedure Laterality Date   ABDOMINAL HYSTERECTOMY  1998   No BSO, for dysfunctional menses   BLADDER SUSPENSION Bilateral    BREAST BIOPSY Right    COLONOSCOPY  2009 , 2014   Diverticulosis; Dr. Olevia Perches   LOBECTOMY  12/02   RU; no radiation or chemo, Dr. Arlyce Dice   TONSILLECTOMY     TOTAL KNEE ARTHROPLASTY Right 12/11/2013   Procedure: RIGHT  TOTAL KNEE ARTHROPLASTY;  Surgeon: Gearlean Alf, MD;  Location: [facility];  Service: Orthopedics;  Laterality: Right;   TOTAL KNEE ARTHROPLASTY Left 06/28/2017   Procedure: LEFT TOTAL KNEE ARTHROPLASTY;  Surgeon: Gaynelle Arabian, MD;  Location: [facility];  Service: Orthopedics;  Laterality: Left;  Adductor Block    Her Family History Is Significant For: Family History  Problem Relation Age of Onset   COPD Mother    Coronary artery disease Father    Colon cancer Father 22   Heart attack Father 50   Stroke Father        > 64   Lung cancer Paternal Aunt        smoker   Cancer Paternal Aunt        lung   Lung cancer Maternal Grandfather        smoker   COPD Maternal Grandfather    Arthritis Sister    Lung cancer Maternal Uncle        smoker   Diabetes Neg Hx     Her Social History Is Significant For: Social History   Socioeconomic History   Marital status: Divorced    Spouse name: Not on file   Number of children: Not on file   Years of education: college   Highest education level: Not on file  Occupational History   Occupation: Retired    Fish farm manager: RETIRED  Tobacco Use   Smoking status: Former    Years: 20.00    Pack years: 0.00    Types: Cigarettes    Quit date: 05/03/1979    Years since quitting: 41.9   Smokeless tobacco: Never   Tobacco comments:    smoked 1962-1980, up to < 1 ppd  Vaping Use   Vaping Use: Never used  Substance and Sexual Activity   Alcohol use: Yes    Alcohol/week: 7.0 standard drinks    Types: 7 Standard drinks or equivalent per week    Comment: occasional   Drug use: No   Sexual activity: Not Currently    Comment: lives alone, dog, no dietary restrictions, eating heart healthy  Other Topics Concern   Not on file  Social History Narrative   Pt gets regular exercise.   Drinks 2 cups of coffee a day    Social Determinants of Radio broadcast assistant Strain: Not on file  Food Insecurity: Not on file  Transportation Needs: Not on file  Physical Activity: Not on file  Stress:  Not on file  Social Connections: Not on file    Her Allergies Are:  No Known Allergies:   Her Current Medications Are:  Outpatient Encounter Medications as of 04/15/2021  Medication Sig   acetaminophen (TYLENOL) 500 MG tablet Take 500 mg by mouth every 6 (six) hours as needed.   CALCIUM PO Take 1 tablet by mouth 3 (three) times daily with meals.   Cholecalciferol (VITAMIN D3 PO) Take 1 tablet by mouth daily.   cyanocobalamin 1000 MCG tablet Take by mouth.   DULoxetine (CYMBALTA) 30 MG capsule Take 1 capsule (30 mg total) by mouth daily.   No facility-administered encounter medications on file as of 04/15/2021.  :  Review of Systems:  Out of a complete 14 point review of systems, all are reviewed and negative with the exception of these symptoms as listed below:  Review of Systems  Neurological:        Pt presents today for cpap follow up. Overall things with CPAP is stable.  DME:  [facility] was set up with the current machine 06/22/2016. Pt states machine is still working well.  She was recently diagnosed with stage 3 ovarian cancer. She had surgery and they believe they got it all. She is getting ready to start a clinical trial.     Objective:  Neurological Exam  Physical Exam Physical Examination:   Vitals:   04/15/21 0728  BP: (!) 151/97  Pulse: 93    General Examination: The patient is a very pleasant 85 y.o. female in no acute distress. She appears well-developed and well-nourished and well groomed.   HEENT: Normocephalic, atraumatic, pupils are equal, round and reactive to light, she is status post cataract repairs, she has corrective eyeglasses in place, extraocular tracking is good, hearing is grossly intact. Face is symmetric with normal facial animation and normal facial sensation. Speech is clear with no dysarthria noted. There is no hypophonia. There is no lip, neck/head, jaw or voice tremor. Neck with FROM. No carotid bruits. Oropharynx exam (we  removed her mask briefly) reveals: mild mouth dryness, adequate dental hygiene and moderate airway crowding. Mallampati is class II. Tongue protrudes centrally and palate elevates symmetrically. Tonsils are absent.   Chest: Clear to auscultation without wheezing, rhonchi or crackles noted.   Heart: S1+S2+0, regular and normal without murmurs, rubs or gallops noted.   Abdomen: Soft, non-tender and non-distended.   Extremities: There is no pitting edema in the distal lower extremities bilaterally.   Skin: Warm and dry without trophic changes noted.   Musculoskeletal: exam reveals no obvious joint deformities, she is status post b/l knee replacement surgeries, mildly larger L knee.  Increase in lumbar kyphosis when she stands up.   Neurologically: Mental status: The patient is awake, alert and oriented in all 4 spheres. Her immediate and remote memory, attention, language skills and fund of knowledge are appropriate. There is no evidence of aphasia, agnosia, apraxia or anomia. Speech is clear with normal prosody and enunciation. Thought process is linear. Mood is normal and affect is normal. Cranial nerves II - XII are as described above under HEENT exam. Motor exam: Normal bulk, strength and tone is noted. There is no tremor. Fine motor skills and coordination: grossly intact. Cerebellar testing: No dysmetria or intention tremor. There is no truncal or gait ataxia. Sensory exam: intact to light touch in the upper and lower extremities. Gait, station and balance: She stands with no significant difficulty. Posture is stooped, she has increased lumbar kyphosis and probably some scoliosis, stable appearing. She walks with a limp on the R, has no cane. Tandem walk and Romberg were not tested for safety reasons.                 Assessment and Plan:    In summary, KELL[facility] is a very pleasant 85 year old right-handed lady with an underlying medical history non-small cell lung cancer, status  post lobectomy in 2002, hypertension, depression, thyroid nodules, arthritis of both knees, status post right total knee arthroplasty, hyponatremia, hypokalemia, hyperlipidemia, vitamin D deficiency, osteoporosis, hemochromatosis, and ovarian cancer with recent s/p surgery in May 2022, who presents for follow-up consultation of her sleep apnea for her yearly checkup, well established on CPAP therapy at a pressure of 7 cm. She is tolerating the treatment, she feels well with regards to her sleep. She continues to be compliant with it and is commended for her CPAP adherence. Of note, her sleep study from 05/18/2016 showed moderate to severe obstructive sleep apnea. She is up-to-date  with his supplies and motivated to continue with treatment. She has benefited from sleep apnea treatment and reports sleeping well with it now.  In the context of her ovarian cancer diagnosis, in the preceding months, she had lost weight.  She is now stable and her appetite is reasonable.  She is in good spirits and is hoping to be able to participate in trial at [name].  She has her cancer treatment at [name].  She is very pleased with their care.  She is advised to follow-up for sleep apnea in 1 year routinely.  She did not need written instructions for today's visit.  I answered all her questions today and she was in agreement with the plan.

## 2021-04-16 ENCOUNTER — Other Ambulatory Visit: Payer: Self-pay

## 2021-04-17 ENCOUNTER — Inpatient Hospital Stay: Payer: Medicare Other | Attending: Hematology & Oncology

## 2021-04-17 ENCOUNTER — Other Ambulatory Visit: Payer: Self-pay

## 2021-04-17 ENCOUNTER — Inpatient Hospital Stay: Payer: Medicare Other

## 2021-04-17 LAB — CBC WITH DIFFERENTIAL (CANCER CENTER ONLY)
Abs Immature Granulocytes: 0.02 10*3/uL (ref 0.00–0.07)
Basophils Absolute: 0.1 10*3/uL (ref 0.0–0.1)
Basophils Relative: 1 %
Eosinophils Absolute: 0.4 10*3/uL (ref 0.0–0.5)
Eosinophils Relative: 4 %
HCT: 43.4 % (ref 36.0–46.0)
Hemoglobin: 14.6 g/dL (ref 12.0–15.0)
Immature Granulocytes: 0 %
Lymphocytes Relative: 27 %
Lymphs Abs: 2.6 10*3/uL (ref 0.7–4.0)
MCH: 31.3 pg (ref 26.0–34.0)
MCHC: 33.6 g/dL (ref 30.0–36.0)
MCV: 92.9 fL (ref 80.0–100.0)
Monocytes Absolute: 1 10*3/uL (ref 0.1–1.0)
Monocytes Relative: 10 %
Neutro Abs: 5.4 10*3/uL (ref 1.7–7.7)
Neutrophils Relative %: 58 %
Platelet Count: 318 10*3/uL (ref 150–400)
RBC: 4.67 MIL/uL (ref 3.87–5.11)
RDW: 12.7 % (ref 11.5–15.5)
WBC Count: 9.4 10*3/uL (ref 4.0–10.5)
nRBC: 0 % (ref 0.0–0.2)

## 2021-04-17 LAB — CMP (CANCER CENTER ONLY)
ALT: 7 U/L (ref 0–44)
AST: 15 U/L (ref 15–41)
Albumin: 3.9 g/dL (ref 3.5–5.0)
Alkaline Phosphatase: 78 U/L (ref 38–126)
Anion gap: 12 (ref 5–15)
BUN: 16 mg/dL (ref 8–23)
CO2: 24 mmol/L (ref 22–32)
Calcium: 9.4 mg/dL (ref 8.9–10.3)
Chloride: 103 mmol/L (ref 98–111)
Creatinine: 0.73 mg/dL (ref 0.44–1.00)
GFR, Estimated: >60 mL/min (ref >60)
Glucose, Bld: 87 mg/dL (ref 70–99)
Potassium: 3.9 mmol/L (ref 3.5–5.1)
Sodium: 139 mmol/L (ref 135–145)
Total Bilirubin: 0.6 mg/dL (ref 0.3–1.2)
Total Protein: 7.2 g/dL (ref 6.5–8.1)

## 2021-04-17 LAB — FERRITIN: Ferritin: 213 ng/mL (ref 11–307)

## 2021-04-17 NOTE — Patient Instructions (Signed)

## 2021-04-17 NOTE — Progress Notes (Signed)
Per Dr. Lorenso Courier, ok for phlebotomy today without waiting on Ferritin result and Hematocrit 43. 4.  [name] presents today for phlebotomy per MD orders Phlebotomy procedure started at 1340 and ended at 1348. 540 grams removed. Patient observed for 30 minutes after procedure without any incident. Patient tolerated procedure well. IV needle removed intact. Food and fluids given.

## 2021-04-28 ENCOUNTER — Telehealth: Payer: Self-pay | Admitting: Internal Medicine

## 2021-04-28 ENCOUNTER — Other Ambulatory Visit: Payer: Self-pay | Admitting: Internal Medicine

## 2021-04-28 DIAGNOSIS — M170 Bilateral primary osteoarthritis of knee: Secondary | ICD-10-CM

## 2021-04-28 MED ORDER — DULOXETINE HCL 30 MG PO CPEP: 30.0000 mg | ORAL_CAPSULE | Freq: Every day | ORAL | 1 refills | Status: DC

## 2021-04-28 NOTE — Telephone Encounter (Signed)
Patient requesting refill for DULoxetine (CYMBALTA) 30 MG capsule  Pharmacy [id number] [place]

## 2021-06-09 IMAGING — MR MR HEAD W/O CM
10 series · 48 of 48 positions shown · non-contrast
Comparison: None.

CLINICAL DATA: Ataxia

EXAM:
MRI HEAD WITHOUT CONTRAST
TECHNIQUE: Multiplanar, multiecho pulse sequences of the brain and surrounding
structures were obtained without intravenous contrast.

[Series 3: t1_se_sag · sagittal · 5.0mm · 0.45mm/px · 3 of 23 slices shown]
[im 1/23]
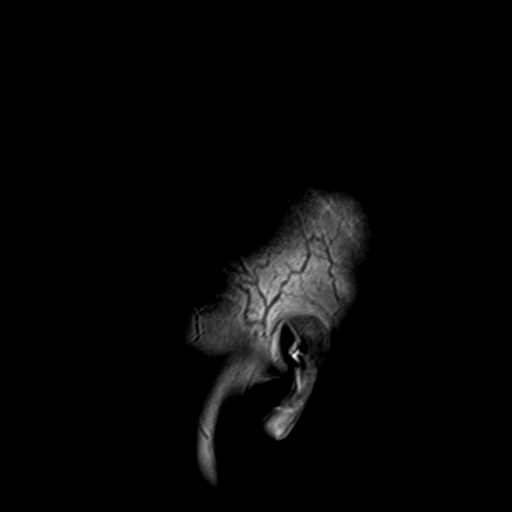
[im 12/23]
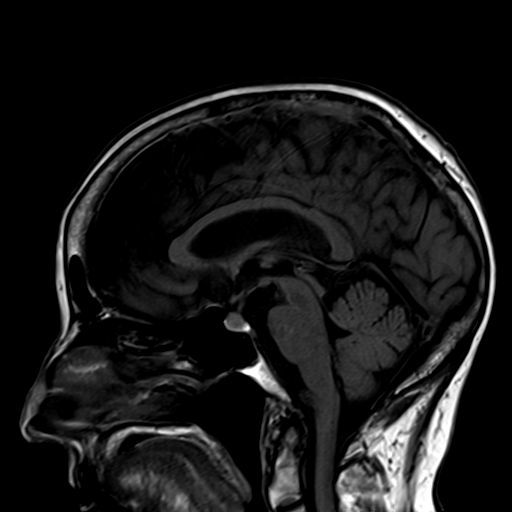
[im 23/23]
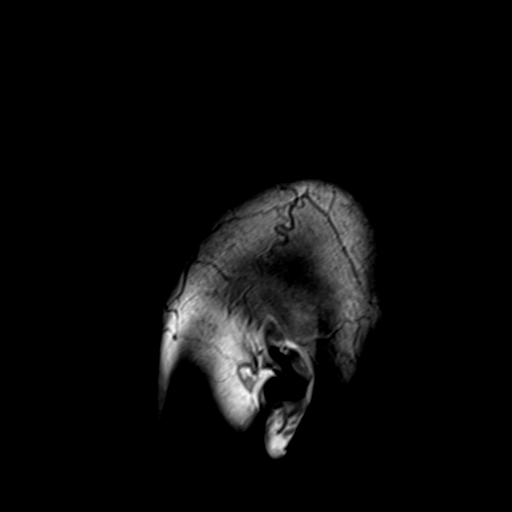

[Series 4: ep2d_diff_3 · axial · 3.0mm · 1.80mm/px · z∈[-72,+75]mm · 9 of 102 slices shown]
[im 1/102]
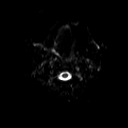
[im 13/102]
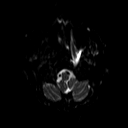
[im 26/102]
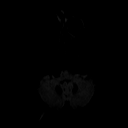
[im 38/102]
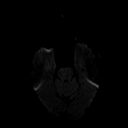
[im 51/102]
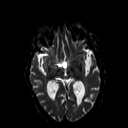
[im 64/102]
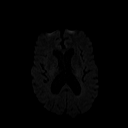
[im 76/102]
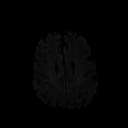
[im 89/102]
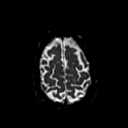
[im 102/102]
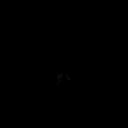

[Series 5: ep2d_diff_3_adc · axial · 3.0mm · 1.80mm/px · z∈[-72,+75]mm · 4 of 50 slices shown]
[im 1/50]
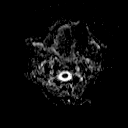
[im 17/50]
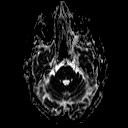
[im 33/50]
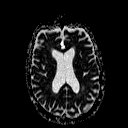
[im 50/50]
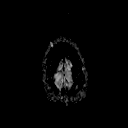

[Series 6: ep2d_diff_cor · coronal · 5.0mm · 1.77mm/px · 5 of 57 slices shown]
[im 1/57]
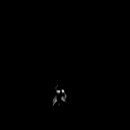
[im 15/57]
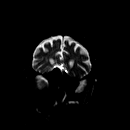
[im 29/57]
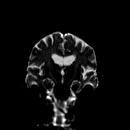
[im 43/57]
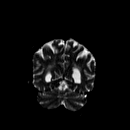
[im 57/57]
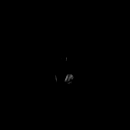

[Series 7: ep2d_diff_cor_adc · coronal · 5.0mm · 1.77mm/px · 2 of 29 slices shown]
[im 1/29]
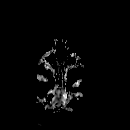
[im 29/29]
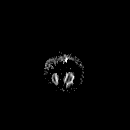

[Series 9: swi_images · axial · 2.0mm · 0.90mm/px · z∈[-71,+68]mm · 6 of 72 slices shown]
[im 1/72]
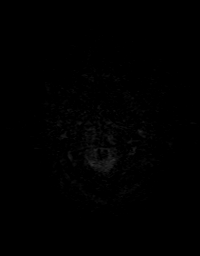
[im 15/72]
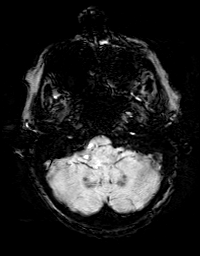
[im 29/72]
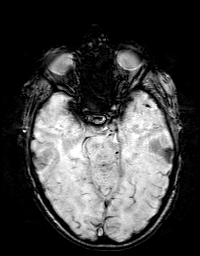
[im 43/72]
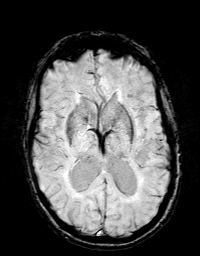
[im 57/72]
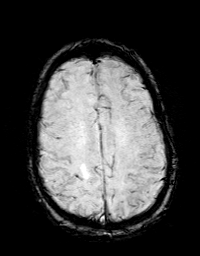
[im 72/72]
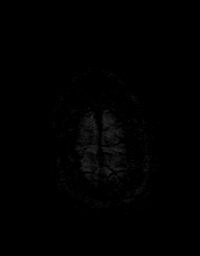

[Series 11: t2_tse_tra_512 · axial · 5.0mm · 0.60mm/px · z∈[-69,+66]mm · 2 of 24 slices shown]
[im 1/24]
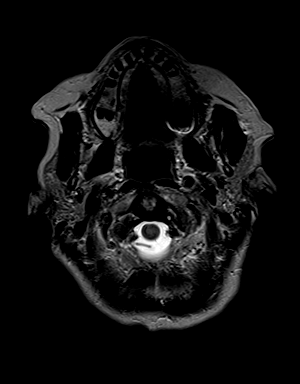
[im 24/24]
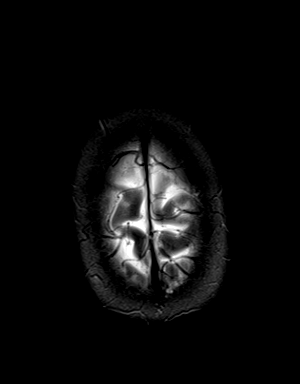

[Series 12: t1_mpr_tra · axial · 1.0mm · 0.72mm/px · z∈[-72,+69]mm · 12 of 144 slices shown]
[im 1/144]
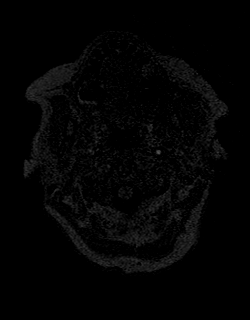
[im 14/144]
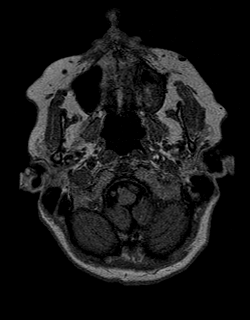
[im 27/144]
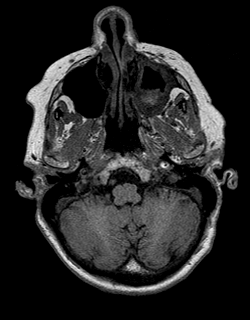
[im 40/144]
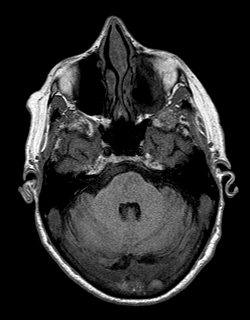
[im 53/144]
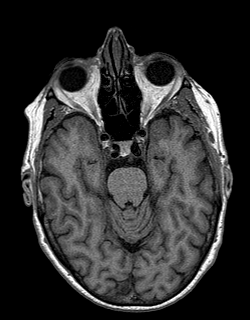
[im 66/144]
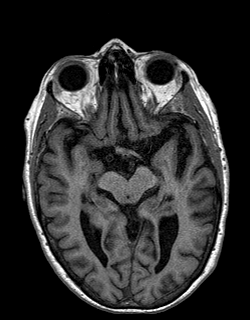
[im 79/144]
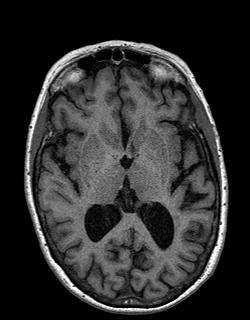
[im 92/144]
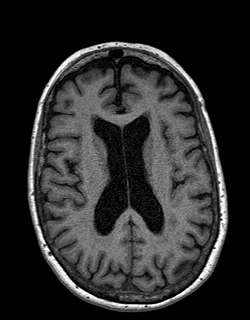
[im 105/144]
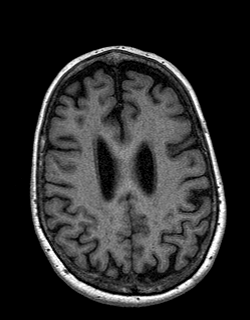
[im 118/144]
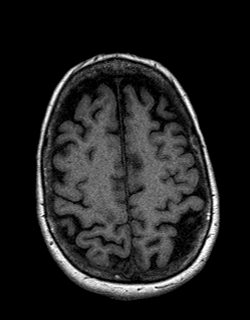
[im 131/144]
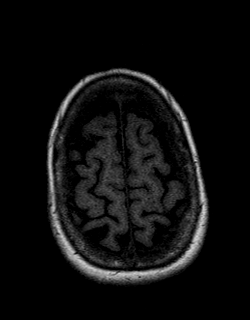
[im 144/144]
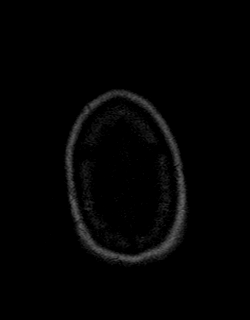

[Series 13: T2 · coronal · 5.0mm · 0.45mm/px · 2 of 28 slices shown]
[im 1/28]
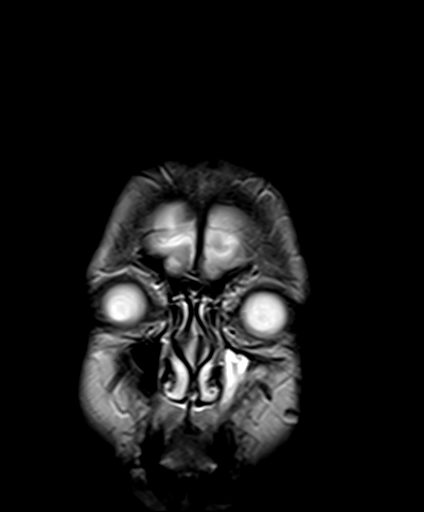
[im 28/28]
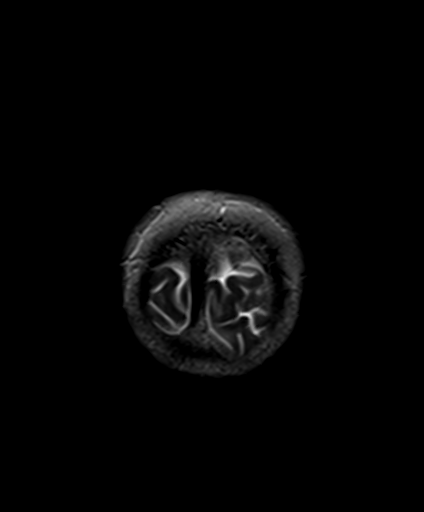

[Series 14: FLAIR · axial · 3.0mm · 0.43mm/px · z∈[-69,+72]mm · 3 of 38 slices shown]
[im 1/38]
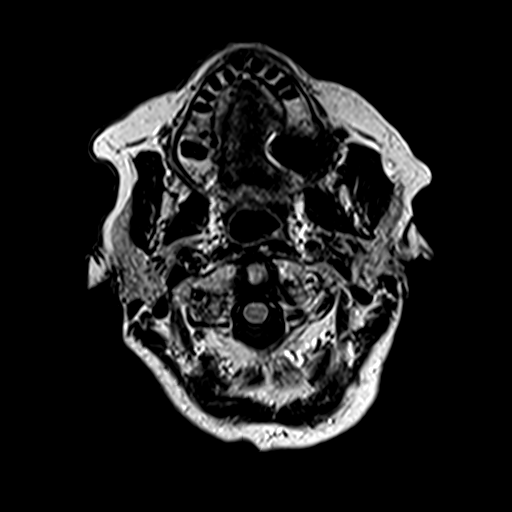
[im 19/38]
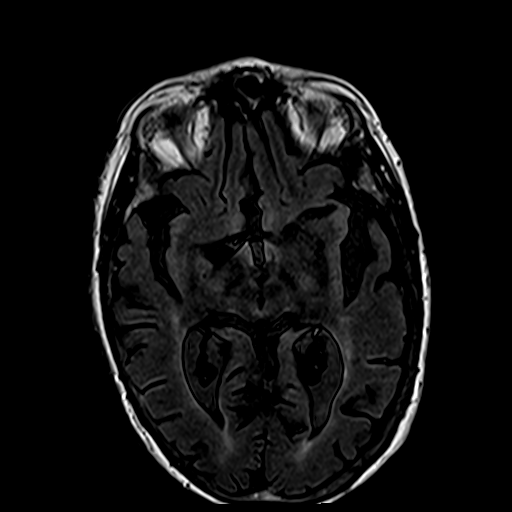
[im 38/38]
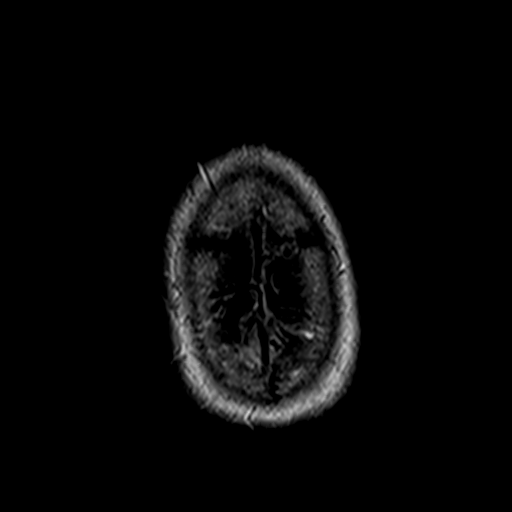

[48 of 48 positions shown; findings below may reference images not displayed]

FINDINGS: Brain: There is no acute infarction or intracranial hemorrhage.
There is no intracranial mass, mass effect, or edema. There is no
hydrocephalus or extra-axial fluid collection. Prominence of the
ventricles and sulci reflects mild generalized parenchymal volume
loss. Patchy T2 hyperintensity in the supratentorial white matter is
nonspecific but probably reflects mild chronic microvascular
ischemic changes.

Vascular: Major vessel flow voids at the skull base are preserved.

Skull and upper cervical spine: Normal marrow signal is preserved.

Sinuses/Orbits: Moderate circumferential left maxillary sinus
mucosal. Mild ethmoid mucosal thickening. Bilateral lens
replacements.

Other: Sella is unremarkable.  Mastoid air cells are clear.
IMPRESSION: No evidence of recent infarction, hemorrhage, or mass. Mild chronic
microvascular ischemic changes.

## 2021-06-12 ENCOUNTER — Inpatient Hospital Stay: Payer: Medicare Other

## 2021-06-12 ENCOUNTER — Inpatient Hospital Stay: Payer: Medicare Other | Admitting: Hematology

## 2021-06-17 ENCOUNTER — Ambulatory Visit: Payer: Medicare Other | Admitting: Family Medicine

## 2021-06-17 NOTE — Progress Notes (Deleted)
[place] Phone: [phone] Subjective:    I'm seeing this patient by the request  of:  Janith Lima, MD  CC: Multiple complaints but primarily right foot pain  [id number]  Maria [name] is a 85 y.o. female coming in with complaint of R foot pain. Patient states      Patient did have x-rays from 2020 from an outside facility.  These were independently visualized by me showing significant midfoot arthritic changes as well as osteopenia.  Past Medical History:  Diagnosis Date   Cataract    Complication of anesthesia    slow to awaken   Depression    Diverticulosis    Goiter, nodular    [place] of thyroid stable 7/08   Headache    hx of   Heart murmur    due to rheumatic fever as child   Hemochromatosis, hereditary ([place]) 09/01/2019   Mitral valve prolapse    Multiple thyroid nodules 11/24/2015   Neoplasm of lung, malignant ([place]) 2002   Non-small cell surgical tx   Osteoarthritis 11/14/2013   2010 intra-articular steroids X 3 S/P Physical Therapy in 12/14 TKR 12/11/13    Osteopenia    Dr Pamala Hurry   PONV (postoperative nausea and vomiting)    n/v   Sleep apnea    Vitamin D deficiency    Past Surgical History:  Procedure Laterality Date   ABDOMINAL HYSTERECTOMY  1998   No BSO, for dysfunctional menses   BLADDER SUSPENSION Bilateral    BREAST BIOPSY Right    COLONOSCOPY  2009 , 2014   Diverticulosis; Dr. Olevia Perches   LOBECTOMY  12/02   RU; no radiation or chemo, Dr. Arlyce Dice   TONSILLECTOMY     TOTAL KNEE ARTHROPLASTY Right 12/11/2013   Procedure: RIGHT TOTAL KNEE ARTHROPLASTY;  Surgeon: Gearlean Alf, MD;  Location: [facility];  Service: Orthopedics;  Laterality: Right;   TOTAL KNEE ARTHROPLASTY Left 06/28/2017   Procedure: LEFT TOTAL KNEE ARTHROPLASTY;  Surgeon: Gaynelle Arabian, MD;  Location: [facility];  Service: Orthopedics;  Laterality: Left;  Adductor Block   Social History   Socioeconomic  History   Marital status: Divorced    Spouse name: Not on file   Number of children: Not on file   Years of education: college   Highest education level: Not on file  Occupational History   Occupation: Retired    Fish farm manager: RETIRED  Tobacco Use   Smoking status: Former    Years: 20.00    Types: Cigarettes    Quit date: 05/03/1979    Years since quitting: 42.1   Smokeless tobacco: Never   Tobacco comments:    smoked 1962-1980, up to < 1 ppd  Vaping Use   Vaping Use: Never used  Substance and Sexual Activity   Alcohol use: Yes    Alcohol/week: 7.0 standard drinks    Types: 7 Standard drinks or equivalent per week    Comment: occasional   Drug use: No   Sexual activity: Not Currently    Comment: lives alone, dog, no dietary restrictions, eating heart healthy  Other Topics Concern   Not on file  Social History Narrative   Pt gets regular exercise.   Drinks 2 cups of coffee a day    Social Determinants of Radio broadcast assistant Strain: Not on file  Food Insecurity: Not on file  Transportation Needs: Not on file  Physical Activity: Not on file  Stress:  Not on file  Social Connections: Not on file   No Known Allergies Family History  Problem Relation Age of Onset   COPD Mother    Coronary artery disease Father    Colon cancer Father 31   Heart attack Father 57   Stroke Father        > 23   Lung cancer Paternal Aunt        smoker   Cancer Paternal Aunt        lung   Lung cancer Maternal Grandfather        smoker   COPD Maternal Grandfather    Arthritis Sister    Lung cancer Maternal Uncle        smoker   Diabetes Neg Hx        Current Outpatient Medications (Analgesics):    acetaminophen (TYLENOL) 500 MG tablet, Take 500 mg by mouth every 6 (six) hours as needed.  Current Outpatient Medications (Hematological):    cyanocobalamin 1000 MCG tablet, Take by mouth.  Current Outpatient Medications (Other):    CALCIUM PO, Take 1 tablet by mouth 3 (three)  times daily with meals.   Cholecalciferol (VITAMIN D3 PO), Take 1 tablet by mouth daily.   DULoxetine (CYMBALTA) 30 MG capsule, Take 1 capsule (30 mg total) by mouth daily.   Reviewed prior external information including notes and imaging from  primary care provider did review patient's tumor board meeting from June, as well as recent follow-up with gynecology for her malignant neoplasm of the ovary.  Also reviewed Dr. Ronnald Ramp prescription of the Cymbalta. As well as notes that were available from care everywhere and other healthcare systems.  Past medical history, social, surgical and family history all reviewed in electronic medical record.  No pertanent information unless stated regarding to the chief complaint.   Review of Systems:  No headache, visual changes, nausea, vomiting, diarrhea, constipation, dizziness, abdominal pain, skin rash, fevers, chills, night sweats, weight loss, swollen lymph nodes, body aches, joint swelling, chest pain, shortness of breath, mood changes. POSITIVE muscle aches  Objective  There were no vitals taken for this visit.   General: No apparent distress alert and oriented x3 mood and affect normal, dressed appropriately.  HEENT: Pupils equal, extraocular movements intact  Respiratory: Patient's speak in full sentences and does not appear short of breath  Cardiovascular: No lower extremity edema, non tender, no erythema  Gait normal with good balance and coordination.  MSK:  Non tender with full range of motion and good stability and symmetric strength and tone of shoulders, elbows, wrist, hip, knee and ankles bilaterally.     Impression and Recommendations:     The above documentation has been reviewed and is accurate and complete Lyndal Pulley, DO

## 2021-09-17 ENCOUNTER — Other Ambulatory Visit: Payer: Self-pay

## 2021-09-17 ENCOUNTER — Encounter: Payer: Self-pay | Admitting: Internal Medicine

## 2021-09-17 ENCOUNTER — Ambulatory Visit (INDEPENDENT_AMBULATORY_CARE_PROVIDER_SITE_OTHER): Payer: Medicare Other | Admitting: Internal Medicine

## 2021-09-17 VITALS — BP 128/82 | HR 102 | Temp 98.3°F | Resp 16 | Ht 66.0 in | Wt 156.0 lb

## 2021-09-17 DIAGNOSIS — I2584 Coronary atherosclerosis due to calcified coronary lesion: Secondary | ICD-10-CM

## 2021-09-17 DIAGNOSIS — C561 Malignant neoplasm of right ovary: Secondary | ICD-10-CM | POA: Diagnosis not present

## 2021-09-17 DIAGNOSIS — Z23 Encounter for immunization: Secondary | ICD-10-CM

## 2021-09-17 DIAGNOSIS — I2510 Atherosclerotic heart disease of native coronary artery without angina pectoris: Secondary | ICD-10-CM | POA: Diagnosis not present

## 2021-09-17 NOTE — Patient Instructions (Signed)
Coronary Artery Disease, Female Coronary artery disease (CAD) is a condition in which the arteries that lead to the heart (coronary arteries) become narrow or blocked. The narrowing or blockage can lead to decreased blood flow to the heart. Prolonged reduced blood flow can cause a heart attack (myocardial infarction or MI). This condition may also be called coronary heart disease. Because CAD is the leading cause of death in women, it is important to understand what causes this condition and how it is treated. What are the causes? CAD is most often caused by atherosclerosis. This is the buildup of fat and cholesterol (plaque) on the inside of the arteries. Over time, the plaque may narrow or block the artery, reducing blood flow to the heart. Plaque can also become weak and break off within a coronary artery and cause a sudden blockage. Other less common causes of CAD include: A blood clot or a piece of a blood clot or other substance that blocks the flow of blood in a coronary artery (embolism). A tearing of the artery (spontaneous coronary artery dissection). An enlargement of an artery (aneurysm). Inflammation (vasculitis) in the artery wall. What increases the risk? The following factors may make you more likely to develop this condition: Age. Women over age 59 are at a greater risk of CAD. Family history of CAD. High blood pressure (hypertension). Diabetes. High cholesterol levels. Tobacco use. Lack of exercise. Menopause. All postmenopausal women are at greater risk of CAD. Women who have experienced menopause between the ages of 15-45 (early menopause) are at a higher risk of CAD. Women who have experienced menopause before age 50 (premature menopause) are at a very high risk of CAD. Excessive alcohol use. A diet high in saturated and trans fats, such as fried food and processed meat. Other possible risk factors include: High stress levels. Depression. Obesity. Sleep apnea. What  are the signs or symptoms? Many people do not have any symptoms during the early stages of CAD. As the condition progresses, symptoms may include: Chest pain (angina). The pain can: Feel like crushing or squeezing, or like a tightness, pressure, fullness, or heaviness in the chest. Last more than a few minutes or can stop and recur. The pain tends to get worse with exercise or stress and to fade with rest. Pain in the arms, neck, jaw, ear, or back. Unexplained heartburn or indigestion. Shortness of breath. Nausea. Sudden cold sweats. Sudden light-headedness. Fluttering or fast heartbeat (palpitations). Many women have chest discomfort and the other symptoms. However, women often have unusual (atypical) symptoms, such as: Fatigue. Vomiting. Unexplained feelings of nervousness or anxiety. Unexplained weakness. Dizziness or fainting. How is this diagnosed? This condition is diagnosed based on: Your family and medical history. A physical exam. Tests, including: A test to check the electrical signals in your heart (electrocardiogram). Exercise stress test. This looks for signs of blockage when the heart is stressed with exercise, such as running on a treadmill. Pharmacologic stress test. This test looks for signs of blockage when the heart is being stressed with a medicine. Blood tests. Coronary angiogram. This is a procedure to look at the coronary arteries to see if there is any blockage. During this test, a dye is injected into your arteries so they appear on an X-ray. Coronary artery CT scan. This CT scan helps detect calcium deposits in your coronary arteries. Calcium deposits are an indicator of CAD. A test that uses sound waves to take a picture of your heart (echocardiogram). Chest X-ray. How is  this treated? This condition may be treated by: Healthy lifestyle changes to reduce risk factors. Medicines such as: Antiplatelet medicines and blood-thinning medicines, such as aspirin.  These help to prevent blood clots. Nitroglycerin. Blood pressure medicines. Cholesterol-lowering medicine. Coronary angioplasty and stenting. During this procedure, a thin, flexible tube is inserted through a blood vessel and into a blocked artery. A balloon or similar device on the end of the tube is inflated to open up the artery. In some cases, a small, mesh tube (stent) is inserted into the artery to keep it open. Coronary artery bypass surgery. During this surgery, veins or arteries from other parts of the body are used to create a bypass around the blockage and allow blood to reach your heart. Follow these instructions at home: Medicines Take over-the-counter and prescription medicines only as told by your health care provider. Do not take the following medicines unless your health care provider approves: NSAIDs, such as ibuprofen, naproxen, or celecoxib. Vitamin supplements that contain vitamin A, vitamin E, or both. Hormone replacement therapy that contains estrogen with or without progestin. Lifestyle Follow an exercise program approved by your health care provider. Aim for 150 minutes of moderate exercise or 75 minutes of vigorous exercise each week. Maintain a healthy weight or lose weight as approved by your health care provider. Learn to manage stress or try to limit your stress. Ask your health care provider for suggestions if you need help. Get screened for depression and seek treatment, if needed. Do not use any products that contain nicotine or tobacco, such as cigarettes, e-cigarettes, and chewing tobacco. If you need help quitting, ask your health care provider. Do not use illegal drugs. Eating and drinking  Follow a heart-healthy diet. A dietitian can help educate you about healthy food options and changes. In general, eat plenty of fruits and vegetables, lean meats, and whole grains. Avoid foods high in: Sugar. Salt (sodium). Saturated fats, such as processed or fatty  meat. Trans fats, such as fried food. Use healthy cooking methods such as roasting, grilling, broiling, baking, poaching, steaming, or stir-frying. Do not drink alcohol if: Your health care provider tells you not to drink. You are pregnant, may be pregnant, or are planning to become pregnant. If you drink alcohol: Limit how much you have to 0-1 drink a day. Be aware of how much alcohol is in your drink. In the [place] one drink equals one 12 oz bottle of beer (355 mL), one 5 oz glass of wine (148 mL), or one 1 oz glass of hard liquor (44 mL). General instructions Manage any other health conditions, such as hypertension and diabetes. These conditions affect your heart. Your health care provider may ask you to monitor your blood pressure. Ideally, your blood pressure should be below 130/80. Keep all follow-up visits as told by your health care provider. This is important. Get help right away if: You have pain in your chest, neck, ear, arm, jaw, stomach, or back that: Lasts more than a few minutes. Is recurring. Is not relieved by taking medicine under your tongue (sublingual nitroglycerin). You have profuse sweating without cause. You have unexplained: Heartburn or indigestion. Shortness of breath or difficulty breathing. Fluttering or fast heartbeat (palpitations). Nausea or vomiting. Fatigue. Feelings of nervousness or anxiety. Weakness. Diarrhea. You have sudden light-headedness or dizziness. You faint. You feel like hurting yourself or think about taking your own life. These symptoms may represent a serious problem that is an emergency. Do not wait to see if the  symptoms will go away. Get medical help right away. Call your local emergency services (911 in the [place] Do not drive yourself to the hospital. Summary Coronary artery disease (CAD) is a condition in which the arteries that lead to the heart (coronary arteries) become narrow or blocked. The narrowing or blockage can lead to  a heart attack. Many women have chest discomfort and other common symptoms of CAD. However, women often have unusual (atypical) symptoms, such as fatigue, vomiting, weakness, or dizziness. CAD can be treated with lifestyle changes, medicines, surgery, or a combination of these treatments. This information is not intended to replace advice given to you by your health care provider. Make sure you discuss any questions you have with your health care provider. Document Revised: 07/08/2018 Document Reviewed: 06/28/2018 Elsevier Patient Education  2022 [employer].

## 2021-09-17 NOTE — Progress Notes (Signed)
Subjective:  Patient ID: [name], female    DOB: 01-Jan-1936  Age: 85 y.o. MRN: [id number]  CC: Follow-up  This visit occurred during the SARS-CoV-2 public health emergency.  Safety protocols were in place, including screening questions prior to the visit, additional usage of staff PPE, and extensive cleaning of exam room while observing appropriate contact time as indicated for disinfecting solutions.    HPI Maria [name] presents for f/up - Since I last saw her she has been diagnosed and treated for ovarian cancer.  She has undergone surgery at [facility] and is in an investigational study and she thinks she is receiving a monoclonal antibody.  She said she has had an excellent response.  On a recent CT scan she was noted to have severe coronary artery atherosclerosis.  She had shortness of breath a few weeks ago that she thought was related to the chemotherapy but in the last 2 weeks she has had no chest pain, shortness of breath, dyspnea on exertion, diaphoresis, edema, or fatigue.  Outpatient Medications Prior to Visit  Medication Sig Dispense Refill   acetaminophen (TYLENOL) 500 MG tablet Take 500 mg by mouth every 6 (six) hours as needed.     CALCIUM PO Take 1 tablet by mouth 3 (three) times daily with meals.     Cholecalciferol (VITAMIN D3 PO) Take 1 tablet by mouth daily.     cyanocobalamin 1000 MCG tablet Take by mouth.     DULoxetine (CYMBALTA) 30 MG capsule Take 1 capsule (30 mg total) by mouth daily. 90 capsule 1   No facility-administered medications prior to visit.    ROS Review of Systems  Constitutional:  Negative for diaphoresis and fatigue.  HENT: Negative.    Eyes: Negative.   Respiratory:  Negative for cough, chest tightness, shortness of breath and wheezing.   Cardiovascular:  Negative for chest pain, palpitations and leg swelling.  Gastrointestinal:  Negative for abdominal pain, diarrhea, nausea and vomiting.  Endocrine: Negative.   Genitourinary:  Negative.  Negative for difficulty urinating.  Musculoskeletal: Negative.   Skin: Negative.   Neurological: Negative.  Negative for dizziness, weakness and light-headedness.  Hematological:  Negative for adenopathy. Does not bruise/bleed easily.  Psychiatric/Behavioral: Negative.     Objective:  BP 128/82 (BP Location: Left Arm, Patient Position: Sitting, Cuff Size: Large)   Pulse (!) 102   Temp 98.3 F (36.8 C) (Oral)   Resp 16   Ht 5\' 6"  (1.676 m)   Wt 156 lb (70.8 kg)   SpO2 97%   BMI 25.18 kg/m   BP Readings from Last 3 Encounters:  09/17/21 128/82  04/17/21 133/72  04/15/21 (!) 151/97    Wt Readings from Last 3 Encounters:  09/17/21 156 lb (70.8 kg)  04/17/21 158 lb 8 oz (71.9 kg)  04/15/21 156 lb (70.8 kg)    Physical Exam Vitals reviewed.  HENT:     Nose: Nose normal.     Mouth/Throat:     Mouth: Mucous membranes are moist.  Eyes:     General: No scleral icterus.    Conjunctiva/sclera: Conjunctivae normal.  Cardiovascular:     Rate and Rhythm: Normal rate and regular rhythm.     Heart sounds: No murmur heard. Pulmonary:     Effort: Pulmonary effort is normal.     Breath sounds: No stridor. No wheezing, rhonchi or rales.  Abdominal:     General: Abdomen is flat.     Palpations: There is no mass.  Tenderness: There is no abdominal tenderness. There is no guarding.     Hernia: No hernia is present.  Musculoskeletal:        General: Normal range of motion.     Cervical back: Neck supple.     Right lower leg: No edema.     Left lower leg: No edema.  Lymphadenopathy:     Cervical: No cervical adenopathy.  Skin:    General: Skin is warm and dry.  Neurological:     General: No focal deficit present.     Mental Status: She is alert.    Lab Results  Component Value Date   WBC 9.4 04/17/2021   HGB 14.6 04/17/2021   HCT 43.4 04/17/2021   PLT 318 04/17/2021   GLUCOSE 87 04/17/2021   CHOL 198 02/05/2021   TRIG 121.0 02/05/2021   HDL 53.30  02/05/2021   LDLDIRECT 137.2 10/23/2008   LDLCALC 121 (H) 02/05/2021   ALT 7 04/17/2021   AST 15 04/17/2021   NA 139 04/17/2021   K 3.9 04/17/2021   CL 103 04/17/2021   CREATININE 0.73 04/17/2021   BUN 16 04/17/2021   CO2 24 04/17/2021   TSH 3.64 11/21/2020   INR 0.95 06/17/2017   HGBA1C 5.8 02/05/2021    CT Abdomen Pelvis W Contrast  Result Date: 02/28/2021 CLINICAL DATA:  Lower abdominal pain, nausea, and constipation for approximately 5 months. 8 lb weight loss over past 6 weeks. Personal history of lung carcinoma and prior hysterectomy. EXAM: CT ABDOMEN AND PELVIS WITH CONTRAST TECHNIQUE: Multidetector CT imaging of the abdomen and pelvis was performed using the standard protocol following bolus administration of intravenous contrast. CONTRAST:  126mL OMNIPAQUE IOHEXOL 300 MG/ML  SOLN COMPARISON:  None. FINDINGS: Lower Chest: No acute findings. Hepatobiliary: No hepatic masses identified. A few tiny hepatic cysts are noted. Gallbladder is unremarkable. No evidence of biliary ductal dilatation. Pancreas:  No mass or inflammatory changes. Spleen: Within normal limits in size and appearance. Adrenals/Urinary Tract: No masses identified. No evidence of ureteral calculi or hydronephrosis. Stomach/Bowel: No evidence of obstruction, inflammatory process or abnormal fluid collections. Diverticulosis is seen mainly involving the sigmoid colon, however there is no evidence of diverticulitis. Vascular/Lymphatic: No pathologically enlarged lymph nodes. No acute vascular findings. Reproductive: A complex cystic mass septations and internal enhancing nodular soft tissue density is seen in the anterior left pelvis which measures 11.9 x 7.3 cm. This is consistent with a left ovarian cystic neoplasm and is suspicious for malignancy. A solid heterogeneously enhancing mass is seen in the lower pelvis just to the right of midline which measures 10.4 x 7.3 cm. This is suspicious for a solid right ovarian neoplasm  given the prior history of hysterectomy. Other: Soft tissue stranding and multiple tiny soft tissue nodules measuring up to 1 cm are seen within the omental fat. There is also mild abnormal soft tissue density along the lateral margin of the right hepatic lobe. These findings are consistent with peritoneal carcinomatosis. No evidence of ascites. Musculoskeletal:  No suspicious bone lesions identified. IMPRESSION: 12 cm cystic and solid left adnexal mass consistent with left ovarian neoplasm, and highly suspicious for malignancy. 10 cm solid mass in lower central pelvis. This is suspicious for right ovarian neoplasm given prior history of hysterectomy. Findings consistent with peritoneal carcinomatosis. These results will be called to the ordering clinician or representative by the Radiologist Assistant, and communication documented in the PACS or [employer]. Electronically Signed   By: Myles Rosenthal.D.  On: 02/28/2021 11:24    Assessment & Plan:   Aislinn was seen today for follow-up.  Diagnoses and all orders for this visit:  Coronary atherosclerosis due to calcified coronary lesion- She is not willing to take a statin.  I recommended that she take a baby aspirin every day.  Will refer to cardiology to see if she needs to undergo any diagnostic testing. -     Ambulatory referral to Cardiology -     aspirin EC 81 MG tablet; Take 1 tablet (81 mg total) by mouth daily.  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  I am having Latesia B. Edelen start on aspirin EC. I am also having her maintain her acetaminophen, Cholecalciferol (VITAMIN D3 PO), cyanocobalamin, CALCIUM PO, and DULoxetine.  Meds ordered this encounter  Medications   aspirin EC 81 MG tablet    Sig: Take 1 tablet (81 mg total) by mouth daily.    Dispense:  90 tablet    Refill:  1       Follow-up: Return in about 3 months (around 12/18/2021).  Scarlette Calico, MD

## 2021-09-18 DIAGNOSIS — C561 Malignant neoplasm of right ovary: Secondary | ICD-10-CM | POA: Insufficient documentation

## 2021-09-18 DIAGNOSIS — C569 Malignant neoplasm of unspecified ovary: Secondary | ICD-10-CM | POA: Insufficient documentation

## 2021-09-18 MED ORDER — ASPIRIN EC 81 MG PO TBEC: 81.0000 mg | DELAYED_RELEASE_TABLET | Freq: Every day | ORAL | 1 refills | Status: DC

## 2021-10-01 ENCOUNTER — Encounter: Payer: Self-pay | Admitting: Family

## 2021-10-02 ENCOUNTER — Encounter: Payer: Self-pay | Admitting: Cardiology

## 2021-10-02 ENCOUNTER — Ambulatory Visit: Payer: Medicare Other | Admitting: Cardiology

## 2021-10-02 ENCOUNTER — Other Ambulatory Visit: Payer: Self-pay

## 2021-10-02 VITALS — BP 138/97 | HR 104 | Resp 16 | Ht 66.0 in | Wt 156.2 lb

## 2021-10-02 DIAGNOSIS — C561 Malignant neoplasm of right ovary: Secondary | ICD-10-CM

## 2021-10-02 DIAGNOSIS — I2510 Atherosclerotic heart disease of native coronary artery without angina pectoris: Secondary | ICD-10-CM

## 2021-10-02 DIAGNOSIS — R9431 Abnormal electrocardiogram [ECG] [EKG]: Secondary | ICD-10-CM

## 2021-10-02 MED ORDER — ASPIRIN EC 81 MG PO TBEC: 81.0000 mg | DELAYED_RELEASE_TABLET | Freq: Every day | ORAL | 11 refills | Status: DC

## 2021-10-02 MED ORDER — ATORVASTATIN CALCIUM 20 MG PO TABS: 20.0000 mg | ORAL_TABLET | Freq: Every day | ORAL | 0 refills | Status: DC

## 2021-10-02 NOTE — Progress Notes (Signed)
Date:  10/02/2021   ID:  Maria [name], DOB Sep 12, 1936, MRN [id number]  PCP:  Janith Lima, MD  Cardiologist:  Rex Kras, DO, [facility] (established care 10/02/2021).  REASON FOR CONSULT: Coronary atherosclerosis due to calcified coronary lesion  REQUESTING PHYSICIAN:  Janith Lima, MD [place],   [id number]  Chief Complaint  Patient presents with    Coronary atherosclerosis   New Patient (Initial Visit)    HPI  Maria [name] is a 85 y.o. Caucasian female who presents to the office with a chief complaint of "coronary artery calcification on CT Scan." Patient's past medical history and cardiovascular risk factors include: severe coronary artery atherosclerosis, malignant neoplasm of the right ovary, former smoker, postmenopausal female, advanced age.  She is referred to the office at the request of Janith Lima, MD for evaluation of Coronary atherosclerosis due to calcified coronary lesion.  Patient has a history of ovarian cancer and follows up with [facility] and recently had a scan in September 2022 and was reported to have coronary artery calcification/atherosclerosis.She discussed the findings with her PCP and now is referred to cardiology for further evaluation and management.  Patient denies any chest pain at rest or with effort related activities.  She denies orthopnea, paroxysmal nocturnal dyspnea or lower extremity swelling.    Given her history of ovarian cancer she has undergone surgical intervention, chemotherapy, and she is currently enrolled in the clinical trial.  FUNCTIONAL STATUS: Walks her dog approximately 1 mile each day using a 3 wheeled walker.   ALLERGIES: No Known Allergies  MEDICATION LIST PRIOR TO VISIT: Current Meds  Medication Sig   acetaminophen (TYLENOL) 500 MG tablet Take 500 mg by mouth every 6 (six) hours as needed.   aspirin EC 81 MG tablet Take 1 tablet (81 mg total) by mouth daily. Swallow whole.    atorvastatin (LIPITOR) 20 MG tablet Take 1 tablet (20 mg total) by mouth at bedtime.   CALCIUM PO Take 1 tablet by mouth 3 (three) times daily with meals.   Cholecalciferol (VITAMIN D3 PO) Take 1 tablet by mouth daily.   cyanocobalamin 1000 MCG tablet Take by mouth.   DULoxetine (CYMBALTA) 30 MG capsule Take 1 capsule (30 mg total) by mouth daily.     PAST MEDICAL HISTORY: Past Medical History:  Diagnosis Date   Cataract    Complication of anesthesia    slow to awaken   Depression    Diverticulosis    Goiter, nodular    [place] of thyroid stable 7/08   Headache    hx of   Heart murmur    due to rheumatic fever as child   Hemochromatosis, hereditary ([place]) 09/01/2019   Mitral valve prolapse    Multiple thyroid nodules 11/24/2015   Neoplasm of lung, malignant ([place]) 2002   Non-small cell surgical tx   Osteoarthritis 11/14/2013   2010 intra-articular steroids X 3 S/P Physical Therapy in 12/14 TKR 12/11/13    Osteopenia    Dr Pamala Hurry   PONV (postoperative nausea and vomiting)    n/v   Sleep apnea    Vitamin D deficiency     PAST SURGICAL HISTORY: Past Surgical History:  Procedure Laterality Date   ABDOMINAL HYSTERECTOMY  1998   No BSO, for dysfunctional menses   BLADDER SUSPENSION Bilateral    BREAST BIOPSY Right    COLONOSCOPY  2009 , 2014   Diverticulosis; Dr. Olevia Perches   LOBECTOMY  12/02   RU; no  radiation or chemo, Dr. Arlyce Dice   TONSILLECTOMY     TOTAL KNEE ARTHROPLASTY Right 12/11/2013   Procedure: RIGHT TOTAL KNEE ARTHROPLASTY;  Surgeon: Gearlean Alf, MD;  Location: [facility];  Service: Orthopedics;  Laterality: Right;   TOTAL KNEE ARTHROPLASTY Left 06/28/2017   Procedure: LEFT TOTAL KNEE ARTHROPLASTY;  Surgeon: Gaynelle Arabian, MD;  Location: [facility];  Service: Orthopedics;  Laterality: Left;  Adductor Block    FAMILY HISTORY: The patient family history includes Arthritis in her sister; COPD in her maternal grandfather and mother; Cancer in her paternal aunt; Colon cancer (age  of onset: 59) in her father; Coronary artery disease in her father; Heart attack (age of onset: 70) in her father; Lung cancer in her maternal grandfather, maternal uncle, and paternal aunt; Stroke in her father.  SOCIAL HISTORY:  The patient  reports that she quit smoking about 42 years ago. Her smoking use included cigarettes. She has a 5.00 pack-year smoking history. She has never used smokeless tobacco. She reports current alcohol use of about 7.0 standard drinks per week. She reports that she does not use drugs.  REVIEW OF SYSTEMS: Review of Systems  Constitutional: Negative for chills and fever.  HENT:  Negative for hoarse voice and nosebleeds.   Eyes:  Negative for discharge, double vision and pain.  Cardiovascular:  Negative for chest pain, claudication, dyspnea on exertion, leg swelling, near-syncope, orthopnea, palpitations, paroxysmal nocturnal dyspnea and syncope.  Respiratory:  Negative for hemoptysis and shortness of breath.   Musculoskeletal:  Negative for muscle cramps and myalgias.  Gastrointestinal:  Negative for abdominal pain, constipation, diarrhea, hematemesis, hematochezia, melena, nausea and vomiting.  Neurological:  Negative for dizziness and light-headedness.  All other systems reviewed and are negative.  PHYSICAL EXAM: Vitals with BMI 10/02/2021 10/02/2021 09/17/2021  Height - _0  _1   Weight - 156 lbs 3 oz 156 lbs  BMI - 63.87 56.43  Systolic 329 518 841  Diastolic 97 93 82  Pulse 660 108 102    CONSTITUTIONAL: Well-developed and well-nourished. No acute distress.  SKIN: Skin is warm and dry. No rash noted. No cyanosis. No pallor. No jaundice HEAD: Normocephalic and atraumatic.  EYES: No scleral icterus MOUTH/THROAT: Moist oral membranes.  NECK: No JVD present. No thyromegaly noted. No carotid bruits  LYMPHATIC: No visible cervical adenopathy.  CHEST Normal respiratory effort. No intercostal retractions  LUNGS: Clear to auscultation bilaterally.  No  stridor. No wheezes. No rales.  CARDIOVASCULAR: Regular rate and rhythm, positive S1-S2, 2 out of 6 systolic murmur, no rubs or gallops appreciated. ABDOMINAL: Nonobese, soft, nontender, nondistended.  EXTREMITIES: No peripheral edema, warm to touch, 2+ bilateral DP and PT pulses HEMATOLOGIC: No significant bruising NEUROLOGIC: Oriented to person, place, and time. Nonfocal. Normal muscle tone.  PSYCHIATRIC: Normal mood and affect. Normal behavior. Cooperative  CARDIAC DATABASE: EKG: 10/02/2021: NSR, 99bpm, LAD, LAFB, LVH, ST-T changes secondary LVH vs ischemia, old inferior infarct.    Echocardiogram: No results found for this or any previous visit from the past 1095 days.   Stress Testing: No results found for this or any previous visit from the past 1095 days.  Heart Catheterization: None  RADIOLOGY: CT abdomen pelvis with contrast: 02/28/2021: 12 cm cystic and solid left adnexal mass consistent with left ovarian neoplasm, and highly suspicious for malignancy.   10 cm solid mass in lower central pelvis. This is suspicious for right ovarian neoplasm given prior history of hysterectomy.   Findings consistent with peritoneal carcinomatosis.  CT chest  abdomen pelvis with contrast 07/16/2021 performed at [facility] records available in Care Everywhere: - Heart and Pericardium: Normal heart size.  No pericardial effusion. Severe calcified atherosclerotic plaque in the coronary arteries.  Impression:  1. No new metastatic disease identified within the abdomen or pelvis.  2. Previously identified peritoneal implants are less conspicuous and no  longer definitely identified.  3. Resolved pelvic fluid collection.    LABORATORY DATA: CBC Latest Ref Rng & Units 04/17/2021 02/13/2021 11/20/2020  WBC 4.0 - 10.5 K/uL 9.4 6.8 6.9  Hemoglobin 12.0 - 15.0 g/dL 14.6 15.4(H) 14.7  Hematocrit 36.0 - 46.0 % 43.4 45.4 43.2  Platelets 150 - 400 K/uL 318 246 266    CMP Latest Ref Rng & Units  04/17/2021 02/13/2021 02/05/2021  Glucose 70 - 99 mg/dL 87 127(H) 90  BUN 8 - 23 mg/dL _0 Creatinine 0.44 - 1.00 mg/dL 0.73 0.74 0.75  Sodium 135 - 145 mmol/L 139 139 138  Potassium 3.5 - 5.1 mmol/L 3.9 3.9 4.1  Chloride 98 - 111 mmol/L 103 104 100  CO2 22 - 32 mmol/L _1 Calcium 8.9 - 10.3 mg/dL 9.4 9.1 9.3  Total Protein 6.5 - 8.1 g/dL 7.2 6.5 -  Total Bilirubin 0.3 - 1.2 mg/dL 0.6 1.0 -  Alkaline Phos 38 - 126 U/L 78 207(H) -  AST 15 - 41 U/L 15 30 -  ALT 0 - 44 U/L 7 9 -    Lipid Panel     Component Value Date/Time   CHOL 198 02/05/2021 0944   TRIG 121.0 02/05/2021 0944   HDL 53.30 02/05/2021 0944   CHOLHDL 4 02/05/2021 0944   VLDL 24.2 02/05/2021 0944   LDLCALC 121 (H) 02/05/2021 0944   LDLDIRECT 137.2 10/23/2008 1036    No components found for: NTPROBNP No results for input(s): PROBNP in the last 8760 hours. Recent Labs    11/21/20 0855  TSH 3.64    BMP Recent Labs    11/20/20 1400 02/05/21 0944 02/13/21 1157 04/17/21 1206  NA 135 138 139 139  K 3.7 4.1 3.9 3.9  CL 100 100 104 103  CO2 _2 GLUCOSE 124* 90 127* 87  BUN _3 CREATININE 0.73 0.75 0.74 0.73  CALCIUM 9.4 9.3 9.1 9.4  GFRNONAA >60  --  >60 >60    HEMOGLOBIN A1C Lab Results  Component Value Date   HGBA1C 5.8 02/05/2021    IMPRESSION:    ICD-10-CM   1. Coronary atherosclerosis due to calcified coronary lesion  I25.10 EKG 12-Lead   I25.84 atorvastatin (LIPITOR) 20 MG tablet    aspirin EC 81 MG tablet    Lipid Panel With LDL/HDL Ratio    LDL cholesterol, direct    CMP14+EGFR    PCV ECHOCARDIOGRAM COMPLETE    PCV MYOCARDIAL PERFUSION WITH LEXISCAN    2. Abnormal EKG  R94.31 PCV ECHOCARDIOGRAM COMPLETE    PCV MYOCARDIAL PERFUSION WITH LEXISCAN    3. Malignant neoplasm of right ovary (HCC)  C56.1        RECOMMENDATIONS: Maria [name] is a 85 y.o. Caucasian female whose past medical history and cardiac risk factors include: severe coronary artery  atherosclerosis, malignant neoplasm of the right ovary, former smoker, postmenopausal female, advanced age.  Coronary atherosclerosis due to calcified coronary lesion Noted to have severe coronary artery calcification on on gated CT 07/2021. After long discussion with regards to risks, benefits, and the pathophysiology of CAD  patient is agreeable to start aspirin 81 mg p.o. daily and statin therapy. Start Lipitor 20 mg p.o. nightly. Will check fasting lipid profile and CMP in 6 weeks to reevaluate therapy. Echocardiogram will be ordered to evaluate for structural heart disease and left ventricular systolic function. Plan nuclear stress test to evaluate for reversible ischemia.  Abnormal EKG Noted to have possible old inferior infarct. Patient states that this is not a new finding she is aware of this from prior ECGs.  We will proceed with echo and stress as discussed above.  Currently denies any anginal discomfort. Monitor for now.  Malignant neoplasm of right ovary (HCC) Continues to follow at [facility].  FINAL MEDICATION LIST END OF ENCOUNTER: Meds ordered this encounter  Medications   atorvastatin (LIPITOR) 20 MG tablet    Sig: Take 1 tablet (20 mg total) by mouth at bedtime.    Dispense:  90 tablet    Refill:  0   aspirin EC 81 MG tablet    Sig: Take 1 tablet (81 mg total) by mouth daily. Swallow whole.    Dispense:  30 tablet    Refill:  11     Medications Discontinued During This Encounter  Medication Reason   aspirin EC 81 MG tablet      Current Outpatient Medications:    acetaminophen (TYLENOL) 500 MG tablet, Take 500 mg by mouth every 6 (six) hours as needed., Disp: , Rfl:    aspirin EC 81 MG tablet, Take 1 tablet (81 mg total) by mouth daily. Swallow whole., Disp: 30 tablet, Rfl: 11   atorvastatin (LIPITOR) 20 MG tablet, Take 1 tablet (20 mg total) by mouth at bedtime., Disp: 90 tablet, Rfl: 0   CALCIUM PO, Take 1 tablet by mouth 3 (three) times daily with meals., Disp: ,  Rfl:    Cholecalciferol (VITAMIN D3 PO), Take 1 tablet by mouth daily., Disp: , Rfl:    cyanocobalamin 1000 MCG tablet, Take by mouth., Disp: , Rfl:    DULoxetine (CYMBALTA) 30 MG capsule, Take 1 capsule (30 mg total) by mouth daily., Disp: 90 capsule, Rfl: 1  Orders Placed This Encounter  Procedures   Lipid Panel With LDL/HDL Ratio   LDL cholesterol, direct   CMP14+EGFR   PCV MYOCARDIAL PERFUSION WITH LEXISCAN   EKG 12-Lead   PCV ECHOCARDIOGRAM COMPLETE    There are no Patient Instructions on file for this visit.   --Continue cardiac medications as reconciled in final medication list. --Return in about 7 weeks (around 11/20/2021) for Follow up, Coronary artery calcification, Review test results. Or sooner if needed. --Continue follow-up with your primary care physician regarding the management of your other chronic comorbid conditions.  Patient's questions and concerns were addressed to her satisfaction. She voices understanding of the instructions provided during this encounter.   This note was created using a voice recognition software as a result there may be grammatical errors inadvertently enclosed that do not reflect the nature of this encounter. Every attempt is made to correct such errors.  Rex Kras, [place], [facility]  Pager: [phone] Office: [phone]

## 2021-10-07 ENCOUNTER — Other Ambulatory Visit: Payer: Self-pay

## 2021-10-07 DIAGNOSIS — I2510 Atherosclerotic heart disease of native coronary artery without angina pectoris: Secondary | ICD-10-CM

## 2021-10-07 DIAGNOSIS — I2584 Coronary atherosclerosis due to calcified coronary lesion: Secondary | ICD-10-CM

## 2021-10-08 ENCOUNTER — Other Ambulatory Visit: Payer: Medicare Other

## 2021-10-08 NOTE — Progress Notes (Signed)
Maria [name] [facility] [place] [address] [place] Phone: [phone] Subjective:   Maria [name], am serving as a scribe for Dr. Hulan Saas. This visit occurred during the SARS-CoV-2 public health emergency.  Safety protocols were in place, including screening questions prior to the visit, additional usage of staff PPE, and extensive cleaning of exam room while observing appropriate contact time as indicated for disinfecting solutions.   I'm seeing this patient by the request  of:  Maria Lima, MD  CC: Neck and back pain as well as foot pain  FTD:[id number]  06/10/2020 Midfoot arthritis.  Discussed with patient about rocker-bottom shoes, topical anti-inflammatories, worsening symptoms will consider injection.  Patient will continue to stay active.  We discussed different medication changes such as gabapentin or the Cymbalta which patient declined increasing at this time but will follow up again in 6 to 8 weeks  Severe overall.  We discussed with patient improvement.  Patient wants to continue with conservative therapy and did have some slight improvement with the epidural.  Patient is still able to walk her dog for approximately 1 mile a day.  He is having some foot pain and we discussed that as well.  Patient will increase activity slowly.  Follow-up again in 4 to 8 weeks patient will be going to [place] for 1 month and we will make sure patient is feeling relatively better by then  Update 10/09/2021 Maria [name] is a 85 y.o. female coming in with complaint of L foot and back pain. Patient states back pain is about the same. Right foot pain across the ankle. Not constant unless walking. Will sometimes wake her up at night. Neck pain with radiating pain down the left and right arm, but left is the worse. Reviewed patient's history ago where patient has been diagnosed with ovarian cancer since we have seen her.  Has undergone chemotherapy.  Patient has  responded very well in reviewing patient's last CT chest abdomen and pelvis no new disease and continuing decreasing in size of the peritoneal nodules.    Past Medical History:  Diagnosis Date   Cataract    Complication of anesthesia    slow to awaken   Depression    Diverticulosis    Goiter, nodular    [place] of thyroid stable 7/08   Headache    hx of   Heart murmur    due to rheumatic fever as child   Hemochromatosis, hereditary ([place]) 09/01/2019   Mitral valve prolapse    Multiple thyroid nodules 11/24/2015   Neoplasm of lung, malignant ([place]) 2002   Non-small cell surgical tx   Osteoarthritis 11/14/2013   2010 intra-articular steroids X 3 S/P Physical Therapy in 12/14 TKR 12/11/13    Osteopenia    Dr Pamala Hurry   PONV (postoperative nausea and vomiting)    n/v   Sleep apnea    Vitamin D deficiency    Past Surgical History:  Procedure Laterality Date   ABDOMINAL HYSTERECTOMY  1998   No BSO, for dysfunctional menses   BLADDER SUSPENSION Bilateral    BREAST BIOPSY Right    COLONOSCOPY  2009 , 2014   Diverticulosis; Dr. Olevia Perches   LOBECTOMY  12/02   RU; no radiation or chemo, Dr. Arlyce Dice   TONSILLECTOMY     TOTAL KNEE ARTHROPLASTY Right 12/11/2013   Procedure: RIGHT TOTAL KNEE ARTHROPLASTY;  Surgeon: Gearlean Alf, MD;  Location: [facility];  Service: Orthopedics;  Laterality: Right;  TOTAL KNEE ARTHROPLASTY Left 06/28/2017   Procedure: LEFT TOTAL KNEE ARTHROPLASTY;  Surgeon: Gaynelle Arabian, MD;  Location: [facility];  Service: Orthopedics;  Laterality: Left;  Adductor Block   Social History   Socioeconomic History   Marital status: Divorced    Spouse name: Not on file   Number of children: Not on file   Years of education: college   Highest education level: Not on file  Occupational History   Occupation: Retired    Fish farm manager: RETIRED  Tobacco Use   Smoking status: Former    Packs/day: 0.25    Years: 20.00    Pack years: 5.00    Types: Cigarettes    Quit date: 05/03/1979     Years since quitting: 42.4   Smokeless tobacco: Never   Tobacco comments:    smoked 1962-1980, up to < 1 ppd  Vaping Use   Vaping Use: Never used  Substance and Sexual Activity   Alcohol use: Yes    Alcohol/week: 7.0 standard drinks    Types: 7 Standard drinks or equivalent per week    Comment: occasional   Drug use: No   Sexual activity: Not Currently    Comment: lives alone, dog, no dietary restrictions, eating heart healthy  Other Topics Concern   Not on file  Social History Narrative   Pt gets regular exercise.   Drinks 2 cups of coffee a day    Social Determinants of Radio broadcast assistant Strain: Not on file  Food Insecurity: Not on file  Transportation Needs: Not on file  Physical Activity: Not on file  Stress: Not on file  Social Connections: Not on file   No Known Allergies Family History  Problem Relation Age of Onset   COPD Mother    Coronary artery disease Father    Colon cancer Father 17   Heart attack Father 2   Stroke Father        > 64   Lung cancer Paternal Aunt        smoker   Cancer Paternal Aunt        lung   Lung cancer Maternal Grandfather        smoker   COPD Maternal Grandfather    Arthritis Sister    Lung cancer Maternal Uncle        smoker   Diabetes Neg Hx      Current Outpatient Medications (Cardiovascular):    atorvastatin (LIPITOR) 20 MG tablet, Take 1 tablet (20 mg total) by mouth at bedtime.   Current Outpatient Medications (Analgesics):    acetaminophen (TYLENOL) 500 MG tablet, Take 500 mg by mouth every 6 (six) hours as needed.   aspirin EC 81 MG tablet, Take 1 tablet (81 mg total) by mouth daily. Swallow whole.  Current Outpatient Medications (Hematological):    cyanocobalamin 1000 MCG tablet, Take by mouth.  Current Outpatient Medications (Other):    CALCIUM PO, Take 1 tablet by mouth 3 (three) times daily with meals.   Cholecalciferol (VITAMIN D3 PO), Take 1 tablet by mouth daily.   DULoxetine (CYMBALTA) 30  MG capsule, Take 1 capsule (30 mg total) by mouth daily.   Reviewed prior external information including notes and imaging from  primary care provider As well as notes that were available from care everywhere and other healthcare systems.  Did see the different notes from outside facility discussing patient's treatment including the infusions for chemotherapy as well as imaging with a CT scans as stated above.  Past  medical history, social, surgical and family history all reviewed in electronic medical record.  No pertanent information unless stated regarding to the chief complaint.   Review of Systems:  No headache, visual changes, nausea, vomiting, diarrhea, constipation, dizziness, abdominal pain, skin rash, fevers, chills, night sweats, weight loss, swollen lymph nodes,  joint swelling, chest pain, shortness of breath, mood changes. POSITIVE muscle aches, body aches  Objective  Blood pressure (!) 142/98, pulse (!) 105, height 5\' 6"  (1.676 m), weight 155 lb (70.3 kg), SpO2 98 %.   General: No apparent distress alert and oriented x3 mood and affect normal, dressed appropriately.  HEENT: Pupils equal, extraocular movements intact  Respiratory: Patient's speak in full sentences and does not appear short of breath  Cardiovascular: No lower extremity edema, non tender, no erythema  Gait antalgic Patient does have arthritic changes of multiple joints.  Back exam does have some loss of lordosis with some degenerative scoliosis.  Mild tightness with straight leg test.  Mild tightness with FABER test.  Neck exam LOSS LORDOSIS.  Limited sidebending bilaterally.  Hand exam shows the patient does have some mild atrophy of the thenar eminence bilaterally.  Good grip strength noted.  Foot exam shows the patient does have some mild tenderness over the ankle mortise itself.  Patient does have significant varicose veins.  He does have limited range of motion with dorsiflexion and plantarflexion 5 degrees.   Mild crepitus.  Limited muscular skeletal ultrasound was performed and interpreted by Hulan Saas, M  Limited ultrasound of patient's right ankle shows some midfoot arthritic changes with some increasing pain Doppler flow in hypoechoic changes consistent with arthritis.  No true mass appreciated.  Patient's ankle mortise does have some mild narrowing but no cortical irregularity.  No significant overlying varicose veins noted which does make it difficult to see deeper. Impression: Ankle and midfoot arthritis and varicose veins making it difficult to further evaluate.   Impression and Recommendations:    The above documentation has been reviewed and is accurate and complete Lyndal Pulley, DO

## 2021-10-09 ENCOUNTER — Ambulatory Visit: Payer: Self-pay

## 2021-10-09 ENCOUNTER — Other Ambulatory Visit: Payer: Self-pay

## 2021-10-09 ENCOUNTER — Encounter: Payer: Self-pay | Admitting: Family Medicine

## 2021-10-09 ENCOUNTER — Ambulatory Visit (INDEPENDENT_AMBULATORY_CARE_PROVIDER_SITE_OTHER): Payer: Medicare Other

## 2021-10-09 ENCOUNTER — Ambulatory Visit (INDEPENDENT_AMBULATORY_CARE_PROVIDER_SITE_OTHER): Payer: Medicare Other | Admitting: Family Medicine

## 2021-10-09 VITALS — BP 142/98 | HR 105 | Ht 66.0 in | Wt 155.0 lb

## 2021-10-09 DIAGNOSIS — M47896 Other spondylosis, lumbar region: Secondary | ICD-10-CM

## 2021-10-09 DIAGNOSIS — M4802 Spinal stenosis, cervical region: Secondary | ICD-10-CM

## 2021-10-09 DIAGNOSIS — M19079 Primary osteoarthritis, unspecified ankle and foot: Secondary | ICD-10-CM | POA: Diagnosis not present

## 2021-10-09 DIAGNOSIS — M19071 Primary osteoarthritis, right ankle and foot: Secondary | ICD-10-CM

## 2021-10-09 NOTE — Assessment & Plan Note (Signed)
Patient does have the arthritic changes noted with moderate to severe loss mostly of the L5-S1 area.  We will get repeat x-rays secondary again to history of cancer.  Patient will increase activity with aquatic therapy.  Hold on any other medications.  Follow-up again in 6 to 8 weeks

## 2021-10-09 NOTE — Assessment & Plan Note (Signed)
History of the degenerative spinal stenosis of the cervical spine.  I did review patient's most recent MRI.  Patient is having radicular symptoms but states that she has had this intermittently for 10 years.  Patient did have chemotherapy so we will need to watch for any type of peripheral neuropathy that could be contributing as well.  Patient will start with formal physical therapy and I would like to do aquatic therapy with patient being somewhat deconditioned.  Follow-up with me again 8 weeks

## 2021-10-09 NOTE — Patient Instructions (Addendum)
PT referral  Add B6 100mg  with B12  CoQ 10 256miligrams with Lipitor Compression socks Xrays today See you again in 6-8 weeks

## 2021-10-09 NOTE — Assessment & Plan Note (Signed)
Patient has had arthritis of the midfoot previously.  Patient on ultrasound does have some hypoechoic changes with some increasing Doppler flow consistent with potential stress reaction as well as underlying arthritic changes.  Secondary to ovarian cancer we will get x-rays to further evaluate.  Patient has had this for many years.  I would like patient to start with aquatic therapy with range of motion for this as well as her low back.  Follow-up with me again in 6 to 8 weeks.

## 2021-10-15 ENCOUNTER — Ambulatory Visit: Payer: Medicare Other

## 2021-10-15 ENCOUNTER — Other Ambulatory Visit: Payer: Self-pay

## 2021-10-15 DIAGNOSIS — I2510 Atherosclerotic heart disease of native coronary artery without angina pectoris: Secondary | ICD-10-CM

## 2021-10-15 DIAGNOSIS — R9431 Abnormal electrocardiogram [ECG] [EKG]: Secondary | ICD-10-CM

## 2021-10-19 ENCOUNTER — Telehealth: Payer: Self-pay | Admitting: Cardiology

## 2021-10-19 NOTE — Telephone Encounter (Signed)
In Error.  ST

## 2021-10-20 ENCOUNTER — Other Ambulatory Visit: Payer: Self-pay

## 2021-10-20 ENCOUNTER — Ambulatory Visit: Payer: Medicare Other

## 2021-10-20 DIAGNOSIS — R9431 Abnormal electrocardiogram [ECG] [EKG]: Secondary | ICD-10-CM

## 2021-10-20 DIAGNOSIS — I2510 Atherosclerotic heart disease of native coronary artery without angina pectoris: Secondary | ICD-10-CM

## 2021-10-28 ENCOUNTER — Telehealth: Payer: Self-pay | Admitting: Hematology

## 2021-10-28 NOTE — Telephone Encounter (Signed)
Scheduled per 12/27 patient message, pt has been called and confirmed this appt

## 2021-10-29 ENCOUNTER — Other Ambulatory Visit: Payer: Medicare Other

## 2021-10-29 ENCOUNTER — Other Ambulatory Visit: Payer: Self-pay | Admitting: Internal Medicine

## 2021-10-29 DIAGNOSIS — M170 Bilateral primary osteoarthritis of knee: Secondary | ICD-10-CM

## 2021-11-04 ENCOUNTER — Ambulatory Visit (HOSPITAL_BASED_OUTPATIENT_CLINIC_OR_DEPARTMENT_OTHER): Payer: Medicare Other | Admitting: Physical Therapy

## 2021-11-06 ENCOUNTER — Other Ambulatory Visit: Payer: Self-pay

## 2021-11-07 ENCOUNTER — Inpatient Hospital Stay: Payer: Medicare Other | Admitting: Hematology

## 2021-11-07 ENCOUNTER — Inpatient Hospital Stay: Payer: Medicare Other | Attending: Hematology

## 2021-11-07 ENCOUNTER — Other Ambulatory Visit: Payer: Self-pay

## 2021-11-07 DIAGNOSIS — Z79899 Other long term (current) drug therapy: Secondary | ICD-10-CM | POA: Diagnosis not present

## 2021-11-07 DIAGNOSIS — F17210 Nicotine dependence, cigarettes, uncomplicated: Secondary | ICD-10-CM | POA: Insufficient documentation

## 2021-11-07 DIAGNOSIS — Z7982 Long term (current) use of aspirin: Secondary | ICD-10-CM | POA: Insufficient documentation

## 2021-11-07 DIAGNOSIS — C569 Malignant neoplasm of unspecified ovary: Secondary | ICD-10-CM | POA: Insufficient documentation

## 2021-11-07 DIAGNOSIS — M8580 Other specified disorders of bone density and structure, unspecified site: Secondary | ICD-10-CM | POA: Insufficient documentation

## 2021-11-07 DIAGNOSIS — G629 Polyneuropathy, unspecified: Secondary | ICD-10-CM | POA: Insufficient documentation

## 2021-11-07 DIAGNOSIS — Z801 Family history of malignant neoplasm of trachea, bronchus and lung: Secondary | ICD-10-CM | POA: Insufficient documentation

## 2021-11-07 LAB — CBC WITH DIFFERENTIAL (CANCER CENTER ONLY)
Abs Immature Granulocytes: 0.01 10*3/uL (ref 0.00–0.07)
Basophils Absolute: 0 10*3/uL (ref 0.0–0.1)
Basophils Relative: 1 %
Eosinophils Absolute: 0.2 10*3/uL (ref 0.0–0.5)
Eosinophils Relative: 3 %
HCT: 43.5 % (ref 36.0–46.0)
Hemoglobin: 14.5 g/dL (ref 12.0–15.0)
Immature Granulocytes: 0 %
Lymphocytes Relative: 28 %
Lymphs Abs: 1.9 10*3/uL (ref 0.7–4.0)
MCH: 31.7 pg (ref 26.0–34.0)
MCHC: 33.3 g/dL (ref 30.0–36.0)
MCV: 95 fL (ref 80.0–100.0)
Monocytes Absolute: 0.8 10*3/uL (ref 0.1–1.0)
Monocytes Relative: 13 %
Neutro Abs: 3.7 10*3/uL (ref 1.7–7.7)
Neutrophils Relative %: 55 %
Platelet Count: 224 10*3/uL (ref 150–400)
RBC: 4.58 MIL/uL (ref 3.87–5.11)
RDW: 11.9 % (ref 11.5–15.5)
WBC Count: 6.6 10*3/uL (ref 4.0–10.5)
nRBC: 0 % (ref 0.0–0.2)

## 2021-11-07 LAB — CMP (CANCER CENTER ONLY)
ALT: 11 U/L (ref 0–44)
AST: 21 U/L (ref 15–41)
Albumin: 4.3 g/dL (ref 3.5–5.0)
Alkaline Phosphatase: 84 U/L (ref 38–126)
Anion gap: 9 (ref 5–15)
BUN: 19 mg/dL (ref 8–23)
CO2: 32 mmol/L (ref 22–32)
Calcium: 9.7 mg/dL (ref 8.9–10.3)
Chloride: 99 mmol/L (ref 98–111)
Creatinine: 0.73 mg/dL (ref 0.44–1.00)
GFR, Estimated: >60 mL/min (ref >60)
Glucose, Bld: 110 mg/dL — ABNORMAL HIGH (ref 70–99)
Potassium: 3.5 mmol/L (ref 3.5–5.1)
Sodium: 140 mmol/L (ref 135–145)
Total Bilirubin: 0.7 mg/dL (ref 0.3–1.2)
Total Protein: 7.3 g/dL (ref 6.5–8.1)

## 2021-11-07 LAB — IRON AND IRON BINDING CAPACITY (CC-WL,HP ONLY)
Iron: 126 ug/dL (ref 28–170)
Saturation Ratios: 40 % — ABNORMAL HIGH (ref 10.4–31.8)
TIBC: 318 ug/dL (ref 250–450)
UIBC: 192 ug/dL (ref 148–442)

## 2021-11-07 LAB — FERRITIN: Ferritin: 315 ng/mL — ABNORMAL HIGH (ref 11–307)

## 2021-11-13 ENCOUNTER — Encounter: Payer: Self-pay | Admitting: Family

## 2021-11-13 NOTE — Progress Notes (Addendum)
HEMATOLOGY/ONCOLOGY CONSULTATION NOTE  Date of Service: .11/07/2021   Patient Care Team: Janith Lima, MD as PCP - General (Internal Medicine) Gaynelle Arabian, MD as Consulting Physician (Orthopedic Surgery) Crista Luria, MD as Consulting Physician (Dermatology) Aloha Gell, MD as Consulting Physician (Obstetrics and Gynecology) Star Age, MD as Consulting Physician (Neurology) Celso Amy, NP as Nurse Practitioner (Nurse Practitioner)  CHIEF COMPLAINTS/PURPOSE OF CONSULTATION:  F/u for hereditary hemochromatosis  HISTORY OF PRESENTING ILLNESS:   Maria [name] is here for follow-up of hereditary hemochromatosis after her last clinic visit with [place] on 12/12/2020.  Since then she was newly diagnosed with stage IIIb high-grade serous ovarian carcinoma and is currently on the [facility] clinical trial and following with [facility] oncology. Status post diagnostic laparoscopy, exploratory laparotomy, bilateral salpingo-oophorectomy, infragastric omentectomy, appendectomy, removal of appendiceal mesentery nodules, optimal (R<1) primary tumor debulking 03/20/21. Initiated carboplatin, paclitaxel, and oregovomab/placebo per GOG 3035/FLORA5 clinical trial 04/25/21; completed cycle #6 carboplatin and paclitaxel 08/08/21. Received cycle #7 oregovomab or placebo on 10/13/21.   Patient notes that she is slowly getting stronger but has had significant fatigue and some neuropathy tolerating her chemotherapy.  We discussed that blood loss with her significant surgeries and anemia with ongoing chemotherapeutic treatments will take precedence over her therapeutic phlebotomies which add to her fatigue. She prefers to focus on treating her ovarian cancer and coordinating care at [facility] which makes a lot of sense to try to reduce her burden of care.  She is also being seen by Dr. Mickle Plumb MD from cardiology for evaluation of coronary artery calcifications on her CT scan.  Labs done today  11/07/2021 show normal CBC with hemoglobin of 14.5 with a WBC count of 6.6k and normal platelets of 224k CMP unremarkable Ferritin 315 with an iron saturation of 40%  No new skin rashes.  No other acute new symptoms at this time.  MEDICAL HISTORY:  Past Medical History:  Diagnosis Date   Cataract    Complication of anesthesia    slow to awaken   Depression    Diverticulosis    Goiter, nodular    [place] of thyroid stable 7/08   Headache    hx of   Heart murmur    due to rheumatic fever as child   Hemochromatosis, hereditary ([place]) 09/01/2019   Mitral valve prolapse    Multiple thyroid nodules 11/24/2015   Neoplasm of lung, malignant ([place]) 2002   Non-small cell surgical tx   Osteoarthritis 11/14/2013   2010 intra-articular steroids X 3 S/P Physical Therapy in 12/14 TKR 12/11/13    Osteopenia    Dr Pamala Hurry   PONV (postoperative nausea and vomiting)    n/v   Sleep apnea    Vitamin D deficiency     SURGICAL HISTORY: Past Surgical History:  Procedure Laterality Date   ABDOMINAL HYSTERECTOMY  1998   No BSO, for dysfunctional menses   BLADDER SUSPENSION Bilateral    BREAST BIOPSY Right    COLONOSCOPY  2009 , 2014   Diverticulosis; Dr. Olevia Perches   LOBECTOMY  12/02   RU; no radiation or chemo, Dr. Arlyce Dice   TONSILLECTOMY     TOTAL KNEE ARTHROPLASTY Right 12/11/2013   Procedure: RIGHT TOTAL KNEE ARTHROPLASTY;  Surgeon: Gearlean Alf, MD;  Location: [facility];  Service: Orthopedics;  Laterality: Right;   TOTAL KNEE ARTHROPLASTY Left 06/28/2017   Procedure: LEFT TOTAL KNEE ARTHROPLASTY;  Surgeon: Gaynelle Arabian, MD;  Location: [facility];  Service: Orthopedics;  Laterality: Left;  Adductor Block    SOCIAL HISTORY: Social History   Socioeconomic History   Marital status: Divorced    Spouse name: Not on file   Number of children: Not on file   Years of education: college   Highest education level: Not on file  Occupational History   Occupation: Retired    Fish farm manager: RETIRED  Tobacco Use    Smoking status: Former    Packs/day: 0.25    Years: 20.00    Pack years: 5.00    Types: Cigarettes    Quit date: 05/03/1979    Years since quitting: 42.5   Smokeless tobacco: Never   Tobacco comments:    smoked 1962-1980, up to < 1 ppd  Vaping Use   Vaping Use: Never used  Substance and Sexual Activity   Alcohol use: Yes    Alcohol/week: 7.0 standard drinks    Types: 7 Standard drinks or equivalent per week    Comment: occasional   Drug use: No   Sexual activity: Not Currently    Comment: lives alone, dog, no dietary restrictions, eating heart healthy  Other Topics Concern   Not on file  Social History Narrative   Pt gets regular exercise.   Drinks 2 cups of coffee a day    Social Determinants of Radio broadcast assistant Strain: Not on file  Food Insecurity: Not on file  Transportation Needs: Not on file  Physical Activity: Not on file  Stress: Not on file  Social Connections: Not on file  Intimate Partner Violence: Not on file    FAMILY HISTORY: Family History  Problem Relation Age of Onset   COPD Mother    Coronary artery disease Father    Colon cancer Father 69   Heart attack Father 69   Stroke Father        > 11   Lung cancer Paternal Aunt        smoker   Cancer Paternal Aunt        lung   Lung cancer Maternal Grandfather        smoker   COPD Maternal Grandfather    Arthritis Sister    Lung cancer Maternal Uncle        smoker   Diabetes Neg Hx     ALLERGIES:  has No Known Allergies.  MEDICATIONS:  Current Outpatient Medications  Medication Sig Dispense Refill   acetaminophen (TYLENOL) 500 MG tablet Take 500 mg by mouth every 6 (six) hours as needed.     aspirin EC 81 MG tablet Take 1 tablet (81 mg total) by mouth daily. Swallow whole. 30 tablet 11   atorvastatin (LIPITOR) 20 MG tablet Take 1 tablet (20 mg total) by mouth at bedtime. 90 tablet 0   CALCIUM PO Take 1 tablet by mouth 3 (three) times daily with meals.     Cholecalciferol (VITAMIN D3  PO) Take 1 tablet by mouth daily.     cyanocobalamin 1000 MCG tablet Take by mouth.     DULoxetine (CYMBALTA) 30 MG capsule TAKE 1 CAPSULE BY MOUTH EVERY DAY 90 capsule 1   No current facility-administered medications for this visit.    REVIEW OF SYSTEMS:   .10 Point review of Systems was done is negative except as noted above.   PHYSICAL EXAMINATION: ECOG PERFORMANCE STATUS: 1 - Symptomatic but completely ambulatory  Vitals:   11/07/21 1200  BP: (!) 154/91  Pulse: 98  Resp: 18  Temp: (!) 97.3 F (36.3 C)  SpO2: 96%  Filed Weights   11/07/21 1200  Weight: 155 lb 3.2 oz (70.4 kg)   .Body mass index is 25.05 kg/m.  Marland Kitchen GENERAL:alert, in no acute distress and comfortable SKIN: no acute rashes, no significant lesions EYES: conjunctiva are pink and non-injected, sclera anicteric OROPHARYNX: MMM, no exudates, no oropharyngeal erythema or ulceration NECK: supple, no JVD LYMPH:  no palpable lymphadenopathy in the cervical, axillary or inguinal regions LUNGS: clear to auscultation b/l with normal respiratory effort HEART: regular rate & rhythm ABDOMEN:  normoactive bowel sounds , non tender, not distended. Extremity: no pedal edema PSYCH: alert & oriented x 3 with fluent speech NEURO: no focal motor/sensory deficits   LABORATORY DATA:  I have reviewed the data as listed  . CBC Latest Ref Rng & Units 11/07/2021 04/17/2021 02/13/2021  WBC 4.0 - 10.5 K/uL 6.6 9.4 6.8  Hemoglobin 12.0 - 15.0 g/dL 14.5 14.6 15.4(H)  Hematocrit 36.0 - 46.0 % 43.5 43.4 45.4  Platelets 150 - 400 K/uL 224 318 246    . CMP Latest Ref Rng & Units 11/13/2021 11/07/2021 04/17/2021  Glucose 70 - 99 mg/dL 100(H) 110(H) 87  BUN 8 - 27 mg/dL 18 19 16   Creatinine 0.57 - 1.00 mg/dL 0.77 0.73 0.73  Sodium 134 - 144 mmol/L 142 140 139  Potassium 3.5 - 5.2 mmol/L 4.1 3.5 3.9  Chloride 96 - 106 mmol/L 100 99 103  CO2 20 - 29 mmol/L 26 32 24  Calcium 8.7 - 10.3 mg/dL 10.0 9.7 9.4  Total Protein 6.0 - 8.5 g/dL  6.9 7.3 7.2  Total Bilirubin 0.0 - 1.2 mg/dL 0.9 0.7 0.6  Alkaline Phos 44 - 121 IU/L 99 84 78  AST 0 - 40 IU/L 25 21 15   ALT 0 - 32 IU/L 14 11 7    08/28/2019    RADIOGRAPHIC STUDIES: I have personally reviewed the radiological images as listed and agreed with the findings in the report. PCV MYOCARDIAL PERFUSION WITH LEXISCAN  Result Date: 10/21/2021 Carlton Adam (with Mod Bruce protocol) Nuclear stress test 10/20/2021: Non-diagnostic ECG stress. Myocardial perfusion is normal. There is a fixed mild defect in the inferior region consistent with breast attenuation. Overall LV systolic function is normal without regional wall motion abnormalities. Stress LV EF: 56%. No previous exam available for comparison. Low risk study.     ASSESSMENT & PLAN:   45 with   1) recently diagnosed stage IIIb high-grade serous ovarian adenocarcinoma being managed at [facility] oncology on FORA5 clinical trial Status post diagnostic laparoscopy, exploratory laparotomy, bilateral salpingo-oophorectomy, infragastric omentectomy, appendectomy, removal of appendiceal mesentery nodules, optimal (R<1) primary tumor debulking 03/20/21. Initiated carboplatin, paclitaxel, and oregovomab/placebo per GOG 3035/FLORA5 clinical trial 04/25/21; completed cycle #6 carboplatin and paclitaxel 08/08/21. Received cycle #7 oregovomab or placebo on 10/13/21.    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 -She notes significant fatigue from her treatment but otherwise has been doing well with treatment.  Mild neuropathy. -She wants to focus on her ovarian cancer treatment since " that needs more significant attention and could kill me".  -Her therapeutic phlebotomies will be difficult to coordinate and tolerated in the context of other ovarian cancer treatment at this time. -We would liberalize her ferritin goals  to keeping the ferritin less than 500 and iron saturation less than 75%. -She prefers to coordinate her therapeutic phlebotomies at [facility] in tandem with her GYN oncology team and she will talk  to them about this. -She will let us know if she prefers to continue follow-up here for attention regarding her hemochromatosis. -I wished her the best and let her know that we are here in case she would like [place] to address anything.   FOLLOW UP: RTC with Dr Irene Limbo as needed F/u with [facility]  All of the patients questions were answered with apparent satisfaction. The patient knows to call the clinic with any problems, questions or concerns.   Sullivan Lone MD [place] AAHIVMS [facility] Hematology/Oncology Physician [facility]

## 2021-11-14 LAB — CMP14+EGFR
ALT: 14 IU/L (ref 0–32)
AST: 25 IU/L (ref 0–40)
Albumin/Globulin Ratio: 2 (ref 1.2–2.2)
Albumin: 4.6 g/dL (ref 3.6–4.6)
Alkaline Phosphatase: 99 IU/L (ref 44–121)
BUN/Creatinine Ratio: 23 (ref 12–28)
BUN: 18 mg/dL (ref 8–27)
Bilirubin Total: 0.9 mg/dL (ref 0.0–1.2)
CO2: 26 mmol/L (ref 20–29)
Calcium: 10 mg/dL (ref 8.7–10.3)
Chloride: 100 mmol/L (ref 96–106)
Creatinine, Ser: 0.77 mg/dL (ref 0.57–1.00)
Globulin, Total: 2.3 g/dL (ref 1.5–4.5)
Glucose: 100 mg/dL — ABNORMAL HIGH (ref 70–99)
Potassium: 4.1 mmol/L (ref 3.5–5.2)
Sodium: 142 mmol/L (ref 134–144)
Total Protein: 6.9 g/dL (ref 6.0–8.5)
eGFR: 76 mL/min/{1.73_m2} (ref >59)

## 2021-11-14 LAB — LIPID PANEL WITH LDL/HDL RATIO
Cholesterol, Total: 162 mg/dL (ref 100–199)
HDL: 64 mg/dL (ref >39)
LDL Chol Calc (NIH): 78 mg/dL (ref 0–99)
LDL/HDL Ratio: 1.2 ratio (ref 0.0–3.2)
Triglycerides: 110 mg/dL (ref 0–149)
VLDL Cholesterol Cal: 20 mg/dL (ref 5–40)

## 2021-11-14 LAB — LDL CHOLESTEROL, DIRECT: LDL Direct: 79 mg/dL (ref 0–99)

## 2021-11-19 ENCOUNTER — Ambulatory Visit: Payer: Medicare Other | Admitting: Cardiology

## 2021-11-19 NOTE — Progress Notes (Signed)
[place] Phone: [phone] Subjective:   Fontaine No, am serving as a scribe for Dr. Hulan Saas. This visit occurred during the SARS-CoV-2 public health emergency.  Safety protocols were in place, including screening questions prior to the visit, additional usage of staff PPE, and extensive cleaning of exam room while observing appropriate contact time as indicated for disinfecting solutions.  I'm seeing this patient by the request  of:  Janith Lima, MD  CC: Low back pain follow-up and foot pain follow-up  [id number]  10/09/2021 Patient has had arthritis of the midfoot previously.  Patient on ultrasound does have some hypoechoic changes with some increasing Doppler flow consistent with potential stress reaction as well as underlying arthritic changes.  Secondary to ovarian cancer we will get x-rays to further evaluate.  Patient has had this for many years.  I would like patient to start with aquatic therapy with range of motion for this as well as her low back.  Follow-up with me again in 6 to 8 weeks.  History of the degenerative spinal stenosis of the cervical spine.  I did review patient's most recent MRI.  Patient is having radicular symptoms but states that she has had this intermittently for 10 years.  Patient did have chemotherapy so we will need to watch for any type of peripheral neuropathy that could be contributing as well.  Patient will start with formal physical therapy and I would like to do aquatic therapy with patient being somewhat deconditioned.  Follow-up with me again 8 weeks  Patient does have the arthritic changes noted with moderate to severe loss mostly of the L5-S1 area.  We will get repeat x-rays secondary again to history of cancer.  Patient will increase activity with aquatic therapy.  Hold on any other medications.  Follow-up again in 6 to 8 weeks  Update 11/20/2021 DOMONIC HISCOX is a 86 y.o. female coming in with complaint of R foot, lumbar and cervical spine pain. Patient states that she continues to have pain in neck and lower back. Does yoga and sees chiro which helps. Would like to do physical therapy.   Right foot pain is not improving. Painful when bearing weight. Pain is in lateral malleolus that radiates into anterior joint.     Xray R ankle 10/09/2021 IMPRESSION: 1. Prominent right ankle osteoarthritis.  No acute bony abnormality.    Xray lumbar 10/09/2021 IMPRESSION: 1. Stable multilevel lumbar spondylosis, facet hypertrophy, and right convex scoliosis. 2. No acute bony abnormality. 3. Suspected peritoneal calcification as above, compatible with the presumed ovarian cancer and peritoneal carcinomatosis seen on prior CT.    Past Medical History:  Diagnosis Date   Cataract    Complication of anesthesia    slow to awaken   Depression    Diverticulosis    Goiter, nodular    [place] of thyroid stable 7/08   Headache    hx of   Heart murmur    due to rheumatic fever as child   Hemochromatosis, hereditary ([place]) 09/01/2019   Mitral valve prolapse    Multiple thyroid nodules 11/24/2015   Neoplasm of lung, malignant ([place]) 2002   Non-small cell surgical tx   Osteoarthritis 11/14/2013   2010 intra-articular steroids X 3 S/P Physical Therapy in 12/14 TKR 12/11/13    Osteopenia    Dr Pamala Hurry   PONV (postoperative nausea and vomiting)    n/v   Sleep apnea  Vitamin D deficiency    Past Surgical History:  Procedure Laterality Date   ABDOMINAL HYSTERECTOMY  1998   No BSO, for dysfunctional menses   BLADDER SUSPENSION Bilateral    BREAST BIOPSY Right    COLONOSCOPY  2009 , 2014   Diverticulosis; Dr. Olevia Perches   LOBECTOMY  12/02   RU; no radiation or chemo, Dr. Arlyce Dice   TONSILLECTOMY     TOTAL KNEE ARTHROPLASTY Right 12/11/2013   Procedure: RIGHT TOTAL KNEE ARTHROPLASTY;  Surgeon: Gearlean Alf, MD;  Location: [facility];  Service: Orthopedics;   Laterality: Right;   TOTAL KNEE ARTHROPLASTY Left 06/28/2017   Procedure: LEFT TOTAL KNEE ARTHROPLASTY;  Surgeon: Gaynelle Arabian, MD;  Location: [facility];  Service: Orthopedics;  Laterality: Left;  Adductor Block   Social History   Socioeconomic History   Marital status: Divorced    Spouse name: Not on file   Number of children: Not on file   Years of education: college   Highest education level: Not on file  Occupational History   Occupation: Retired    Fish farm manager: RETIRED  Tobacco Use   Smoking status: Former    Packs/day: 0.25    Years: 20.00    Pack years: 5.00    Types: Cigarettes    Quit date: 05/03/1979    Years since quitting: 42.5   Smokeless tobacco: Never   Tobacco comments:    smoked 1962-1980, up to < 1 ppd  Vaping Use   Vaping Use: Never used  Substance and Sexual Activity   Alcohol use: Yes    Alcohol/week: 7.0 standard drinks    Types: 7 Standard drinks or equivalent per week    Comment: occasional   Drug use: No   Sexual activity: Not Currently    Comment: lives alone, dog, no dietary restrictions, eating heart healthy  Other Topics Concern   Not on file  Social History Narrative   Pt gets regular exercise.   Drinks 2 cups of coffee a day    Social Determinants of Radio broadcast assistant Strain: Not on file  Food Insecurity: Not on file  Transportation Needs: Not on file  Physical Activity: Not on file  Stress: Not on file  Social Connections: Not on file   No Known Allergies Family History  Problem Relation Age of Onset   COPD Mother    Coronary artery disease Father    Colon cancer Father 5   Heart attack Father 89   Stroke Father        > 52   Lung cancer Paternal Aunt        smoker   Cancer Paternal Aunt        lung   Lung cancer Maternal Grandfather        smoker   COPD Maternal Grandfather    Arthritis Sister    Lung cancer Maternal Uncle        smoker   Diabetes Neg Hx      Current Outpatient Medications (Cardiovascular):     atorvastatin (LIPITOR) 20 MG tablet, Take 1 tablet (20 mg total) by mouth at bedtime.   Current Outpatient Medications (Analgesics):    acetaminophen (TYLENOL) 500 MG tablet, Take 500 mg by mouth every 6 (six) hours as needed.   aspirin EC 81 MG tablet, Take 1 tablet (81 mg total) by mouth daily. Swallow whole.  Current Outpatient Medications (Hematological):    cyanocobalamin 1000 MCG tablet, Take by mouth.  Current Outpatient Medications (Other):  CALCIUM PO, Take 1 tablet by mouth 3 (three) times daily with meals.   Cholecalciferol (VITAMIN D3 PO), Take 1 tablet by mouth daily.   DULoxetine (CYMBALTA) 30 MG capsule, TAKE 1 CAPSULE BY MOUTH EVERY DAY   Reviewed prior external information including notes and imaging from  primary care provider As well as notes that were available from care everywhere and other healthcare systems.  Past medical history, social, surgical and family history all reviewed in electronic medical record.  No pertanent information unless stated regarding to the chief complaint.   Review of Systems:  No headache, visual changes, nausea, vomiting, diarrhea, constipation, dizziness, abdominal pain, skin rash, fevers, chills, night sweats, weight loss, swollen lymph nodes, body aches, joint swelling, chest pain, shortness of breath, mood changes. POSITIVE muscle aches  Objective  Blood pressure 122/90, pulse 91, height 5\' 6"  (1.676 m), weight 154 lb (69.9 kg), SpO2 98 %.   General: No apparent distress alert and oriented x3 mood and affect normal, dressed appropriately.  HEENT: Pupils equal, extraocular movements intact  Respiratory: Patient's speak in full sentences and does not appear short of breath  Cardiovascular: No lower extremity edema, non tender, no erythema  Gait antalgic Significant arthritic changes of multiple joints.  Patient's right ankle has significant decrease in range of motion. Patient's low back does have significant degenerative  scoliosis noted.  Limited range of motion in all planes.  Tightness with straight leg test.  4 out of 5 strength in lower extremities bilaterally.    Impression and Recommendations:     The above documentation has been reviewed and is accurate and complete Lyndal Pulley, DO

## 2021-11-20 ENCOUNTER — Ambulatory Visit: Payer: Medicare Other | Admitting: Family Medicine

## 2021-11-20 ENCOUNTER — Encounter: Payer: Self-pay | Admitting: Family Medicine

## 2021-11-20 ENCOUNTER — Other Ambulatory Visit: Payer: Self-pay

## 2021-11-20 VITALS — BP 122/90 | HR 91 | Ht 66.0 in | Wt 154.0 lb

## 2021-11-20 DIAGNOSIS — M19079 Primary osteoarthritis, unspecified ankle and foot: Secondary | ICD-10-CM

## 2021-11-20 DIAGNOSIS — M47896 Other spondylosis, lumbar region: Secondary | ICD-10-CM

## 2021-11-20 NOTE — Assessment & Plan Note (Addendum)
Discussed again with the patient.  We discussed the possibility of advanced imaging the patient has not had any fevers, chills, any abnormal weight loss.  Patient feels like if she can start becoming more active she would do well and will start with formal physical therapy.  Held on anything else at this moment such as medications.  Patient will have the physical therapy and we will see how patient responds and follow-up with me again in 6 weeks total time reviewing outside notes, patient's oncology notes, and imaging greater than 31 minutes.

## 2021-11-20 NOTE — Assessment & Plan Note (Signed)
Arthritis of the midfoot as well as the ankle.  Likely contributing to some of the abdomen antalgic gait.  Patient's hemochromatosis as well as chemotherapy likely contributed as well.  Would like patient to start with formal physical therapy.  Do think that this will be significantly helpful.  Patient is encouraged by this and will do it also for her back.  We discussed at this point about different medications.  Patient wants to hold on anything significantly other than potentially continuing her Cymbalta.  May need to consider possibly even increasing her dose.  Patient will otherwise stay active where she can and will follow-up with me again in 6 to 8 weeks

## 2021-11-20 NOTE — Patient Instructions (Signed)
PT [place] back and ankle [employer] for long walking Ice after activity See me in 2 months

## 2021-12-04 ENCOUNTER — Ambulatory Visit: Payer: Medicare Other | Admitting: Cardiology

## 2021-12-04 ENCOUNTER — Encounter: Payer: Self-pay | Admitting: Cardiology

## 2021-12-04 ENCOUNTER — Other Ambulatory Visit: Payer: Self-pay

## 2021-12-04 VITALS — BP 120/84 | HR 89 | Temp 96.5°F | Resp 16 | Ht 66.0 in | Wt 157.0 lb

## 2021-12-04 DIAGNOSIS — I2510 Atherosclerotic heart disease of native coronary artery without angina pectoris: Secondary | ICD-10-CM

## 2021-12-04 DIAGNOSIS — C561 Malignant neoplasm of right ovary: Secondary | ICD-10-CM

## 2021-12-04 NOTE — Progress Notes (Signed)
Date:  12/04/2021   ID:  Maria [name], DOB 22-Dec-1935, MRN [id number]  PCP:  Janith Lima, MD  Cardiologist:  Rex Kras, DO, [facility] (established care 10/02/2021).  Date: 12/04/21 Last Office Visit: 10/02/2021  Chief Complaint  Patient presents with   Coronary artery calcification   Results   Follow-up    HPI  Maria [name] is a 86 y.o. Caucasian female who presents to the office with a chief complaint of "follow-up for coronary artery calcification work-up." Patient's past medical history and cardiovascular risk factors include: severe coronary artery atherosclerosis (not gated CT study), malignant neoplasm of the right ovary, former smoker, postmenopausal female, advanced age.  She is referred to the office at the request of Janith Lima, MD for evaluation of Coronary atherosclerosis due to calcified coronary lesion.  Given her ovarian cancer she follows up with [facility] and recently had a scan in September 2022 which noted severe coronary artery calcification/atherosclerosis and on a nongated CT study.  She was referred to cardiology for further evaluation and management.  Clinically she did not have any angina pectoris or heart failure symptoms.  She was recommended to start aspirin and statin therapy.  Patient states that she has not started aspirin 81 mg p.o. daily.  She tolerated statin therapy well without any side effects or intolerances and her LDL levels have improved as per the labs dated 11/13/2021.  Results of the echocardiogram and stress test reviewed with her in great detail and noted below for further reference.  FUNCTIONAL STATUS: Walks her dog approximately 1 mile each day using a 3 wheeled walker.   ALLERGIES: No Known Allergies  MEDICATION LIST PRIOR TO VISIT: Current Meds  Medication Sig   acetaminophen (TYLENOL) 500 MG tablet Take 500 mg by mouth every 6 (six) hours as needed.   aspirin EC 81 MG tablet Take 1 tablet (81 mg total) by  mouth daily. Swallow whole.   atorvastatin (LIPITOR) 20 MG tablet Take 1 tablet (20 mg total) by mouth at bedtime.   CALCIUM PO Take 1 tablet by mouth 3 (three) times daily with meals.   Cholecalciferol (VITAMIN D3 PO) Take 1 tablet by mouth daily.   cyanocobalamin 1000 MCG tablet Take by mouth.   DULoxetine (CYMBALTA) 30 MG capsule TAKE 1 CAPSULE BY MOUTH EVERY DAY     PAST MEDICAL HISTORY: Past Medical History:  Diagnosis Date   Cataract    Complication of anesthesia    slow to awaken   Depression    Diverticulosis    Goiter, nodular    [place] of thyroid stable 7/08   Headache    hx of   Heart murmur    due to rheumatic fever as child   Hemochromatosis, hereditary ([place]) 09/01/2019   Mitral valve prolapse    Multiple thyroid nodules 11/24/2015   Neoplasm of lung, malignant ([place]) 2002   Non-small cell surgical tx   Osteoarthritis 11/14/2013   2010 intra-articular steroids X 3 S/P Physical Therapy in 12/14 TKR 12/11/13    Osteopenia    Dr Pamala Hurry   PONV (postoperative nausea and vomiting)    n/v   Sleep apnea    Vitamin D deficiency     PAST SURGICAL HISTORY: Past Surgical History:  Procedure Laterality Date   ABDOMINAL HYSTERECTOMY  1998   No BSO, for dysfunctional menses   BLADDER SUSPENSION Bilateral    BREAST BIOPSY Right    COLONOSCOPY  2009 , 2014   Diverticulosis; Dr. Olevia Perches  LOBECTOMY  12/02   RU; no radiation or chemo, Dr. Arlyce Dice   TONSILLECTOMY     TOTAL KNEE ARTHROPLASTY Right 12/11/2013   Procedure: RIGHT TOTAL KNEE ARTHROPLASTY;  Surgeon: Gearlean Alf, MD;  Location: [facility];  Service: Orthopedics;  Laterality: Right;   TOTAL KNEE ARTHROPLASTY Left 06/28/2017   Procedure: LEFT TOTAL KNEE ARTHROPLASTY;  Surgeon: Gaynelle Arabian, MD;  Location: [facility];  Service: Orthopedics;  Laterality: Left;  Adductor Block    FAMILY HISTORY: The patient family history includes Arthritis in her sister; COPD in her maternal grandfather and mother; Cancer in her paternal  aunt; Colon cancer (age of onset: 42) in her father; Coronary artery disease in her father; Heart attack (age of onset: 22) in her father; Lung cancer in her maternal grandfather, maternal uncle, and paternal aunt; Stroke in her father.  SOCIAL HISTORY:  The patient  reports that she quit smoking about 42 years ago. Her smoking use included cigarettes. She has a 5.00 pack-year smoking history. She has never used smokeless tobacco. She reports current alcohol use of about 7.0 standard drinks per week. She reports that she does not use drugs.  REVIEW OF SYSTEMS: Review of Systems  Constitutional: Negative for chills and fever.  HENT:  Negative for hoarse voice and nosebleeds.   Eyes:  Negative for discharge, double vision and pain.  Cardiovascular:  Negative for chest pain, claudication, dyspnea on exertion, leg swelling, near-syncope, orthopnea, palpitations, paroxysmal nocturnal dyspnea and syncope.  Respiratory:  Negative for hemoptysis and shortness of breath.   Musculoskeletal:  Negative for muscle cramps and myalgias.  Gastrointestinal:  Negative for abdominal pain, constipation, diarrhea, hematemesis, hematochezia, melena, nausea and vomiting.  Neurological:  Negative for dizziness and light-headedness.  All other systems reviewed and are negative.  PHYSICAL EXAM: Vitals with BMI 12/04/2021 12/04/2021 11/20/2021  Height - 5\' 6"  5\' 6"   Weight - 157 lbs 154 lbs  BMI - 27.74 12.87  Systolic 867 672 094  Diastolic 84 90 90  Pulse 99 101 91    CONSTITUTIONAL: Well-developed and well-nourished. No acute distress.  SKIN: Skin is warm and dry. No rash noted. No cyanosis. No pallor. No jaundice HEAD: Normocephalic and atraumatic.  EYES: No scleral icterus MOUTH/THROAT: Moist oral membranes.  NECK: No JVD present. No thyromegaly noted. No carotid bruits  LYMPHATIC: No visible cervical adenopathy.  CHEST Normal respiratory effort. No intercostal retractions  LUNGS: Clear to auscultation  bilaterally.  No stridor. No wheezes. No rales.  CARDIOVASCULAR: Regular rate and rhythm, positive S1-S2, 2 out of 6 systolic murmur, no rubs or gallops appreciated. ABDOMINAL: Nonobese, soft, nontender, nondistended.  EXTREMITIES: No peripheral edema, warm to touch, 2+ bilateral DP and PT pulses HEMATOLOGIC: No significant bruising NEUROLOGIC: Oriented to person, place, and time. Nonfocal. Normal muscle tone.  PSYCHIATRIC: Normal mood and affect. Normal behavior. Cooperative  CARDIAC DATABASE: EKG: 10/02/2021: NSR, 99bpm, LAD, LAFB, LVH, ST-T changes secondary LVH vs ischemia, old inferior infarct.    Echocardiogram: 10/15/2021: Normal LV systolic function with visual EF 60-65%. Left ventricle cavity is normal in size. Normal left ventricular wall thickness with basal hypertrophy. Normal global wall motion. Normal diastolic filling pattern, normal LAP.  Mild (Grade I) aortic regurgitation. Mild tricuspid regurgitation. No evidence of pulmonary hypertension. No prior study for comparison.   Stress Testing: Lexiscan (with Mod Bruce protocol) Nuclear stress test 10/20/2021: Non-diagnostic ECG stress. Myocardial perfusion is normal. There is a fixed mild defect in the inferior region consistent with breast attenuation.  Overall  LV systolic function is normal without regional wall motion abnormalities. Stress LV EF: 56%.  No previous exam available for comparison. Low risk study.   Heart Catheterization: None  RADIOLOGY: CT abdomen pelvis with contrast: 02/28/2021: 12 cm cystic and solid left adnexal mass consistent with left ovarian neoplasm, and highly suspicious for malignancy.   10 cm solid mass in lower central pelvis. This is suspicious for right ovarian neoplasm given prior history of hysterectomy.   Findings consistent with peritoneal carcinomatosis.  CT chest abdomen pelvis with contrast 07/16/2021 performed at [facility] records available in Care Everywhere: - Heart  and Pericardium: Normal heart size.  No pericardial effusion. Severe calcified atherosclerotic plaque in the coronary arteries.  Impression:  1. No new metastatic disease identified within the abdomen or pelvis.  2. Previously identified peritoneal implants are less conspicuous and no  longer definitely identified.  3. Resolved pelvic fluid collection.    LABORATORY DATA: CBC Latest Ref Rng & Units 11/07/2021 04/17/2021 02/13/2021  WBC 4.0 - 10.5 K/uL 6.6 9.4 6.8  Hemoglobin 12.0 - 15.0 g/dL 14.5 14.6 15.4(H)  Hematocrit 36.0 - 46.0 % 43.5 43.4 45.4  Platelets 150 - 400 K/uL 224 318 246    CMP Latest Ref Rng & Units 11/13/2021 11/07/2021 04/17/2021  Glucose 70 - 99 mg/dL 100(H) 110(H) 87  BUN 8 - 27 mg/dL 18 19 16   Creatinine 0.57 - 1.00 mg/dL 0.77 0.73 0.73  Sodium 134 - 144 mmol/L 142 140 139  Potassium 3.5 - 5.2 mmol/L 4.1 3.5 3.9  Chloride 96 - 106 mmol/L 100 99 103  CO2 20 - 29 mmol/L 26 32 24  Calcium 8.7 - 10.3 mg/dL 10.0 9.7 9.4  Total Protein 6.0 - 8.5 g/dL 6.9 7.3 7.2  Total Bilirubin 0.0 - 1.2 mg/dL 0.9 0.7 0.6  Alkaline Phos 44 - 121 IU/L 99 84 78  AST 0 - 40 IU/L 25 21 15   ALT 0 - 32 IU/L 14 11 7     Lipid Panel  Lab Results  Component Value Date   CHOL 162 11/13/2021   HDL 64 11/13/2021   LDLCALC 78 11/13/2021   LDLDIRECT 79 11/13/2021   TRIG 110 11/13/2021   CHOLHDL 4 02/05/2021    No components found for: NTPROBNP No results for input(s): PROBNP in the last 8760 hours. No results for input(s): TSH in the last 8760 hours.   BMP Recent Labs    02/13/21 1157 04/17/21 1206 11/07/21 1130 11/13/21 0840  NA 139 139 140 142  K 3.9 3.9 3.5 4.1  CL 104 103 99 100  CO2 24 24 32 26  GLUCOSE 127* 87 110* 100*  BUN 13 16 19 18   CREATININE 0.74 0.73 0.73 0.77  CALCIUM 9.1 9.4 9.7 10.0  GFRNONAA >60 >60 >60  --     HEMOGLOBIN A1C Lab Results  Component Value Date   HGBA1C 5.8 02/05/2021    IMPRESSION:    ICD-10-CM   1. Coronary atherosclerosis due to  calcified coronary lesion  I25.10    I25.84     2. Malignant neoplasm of right ovary (HCC)  C56.1         RECOMMENDATIONS: Maria [name] is a 86 y.o. Caucasian female whose past medical history and cardiac risk factors include: severe coronary artery atherosclerosis, malignant neoplasm of the right ovary, former smoker, postmenopausal female, advanced age.  Coronary atherosclerosis due to calcified coronary lesion Severe coronary artery calcification/atherosclerosis based on nongated CT study 07/2021 at [facility]. Has tolerated  Lipitor 20 mg p.o. nightly-resulting in improvement in LDL levels from 121 mg/dL to 79 mg/dL. Educated her on the importance of starting aspirin 81 mg p.o. daily as long as she remains with low risk for bleeding.  Patient is agreeable. Echo 10/2021: LVEF preserved, normal diastolic filling pattern, mild AR/TR MPI 10/2021: Normal myocardial perfusion, low risk study No additional cardiovascular testing warranted at this time.  Malignant neoplasm of right ovary [facility]) Currently enrolled in the clinical trial. Follows with [employer].  As part of today's office visit discussed disease management, medication profile, lipid profile from April 2022 and January 2023, echocardiogram results, and nuclear stress test results.  FINAL MEDICATION LIST END OF ENCOUNTER: No orders of the defined types were placed in this encounter.    There are no discontinued medications.    Current Outpatient Medications:    acetaminophen (TYLENOL) 500 MG tablet, Take 500 mg by mouth every 6 (six) hours as needed., Disp: , Rfl:    aspirin EC 81 MG tablet, Take 1 tablet (81 mg total) by mouth daily. Swallow whole., Disp: 30 tablet, Rfl: 11   atorvastatin (LIPITOR) 20 MG tablet, Take 1 tablet (20 mg total) by mouth at bedtime., Disp: 90 tablet, Rfl: 0   CALCIUM PO, Take 1 tablet by mouth 3 (three) times daily with meals., Disp: , Rfl:    Cholecalciferol (VITAMIN D3 PO),  Take 1 tablet by mouth daily., Disp: , Rfl:    cyanocobalamin 1000 MCG tablet, Take by mouth., Disp: , Rfl:    DULoxetine (CYMBALTA) 30 MG capsule, TAKE 1 CAPSULE BY MOUTH EVERY DAY, Disp: 90 capsule, Rfl: 1  No orders of the defined types were placed in this encounter.   There are no Patient Instructions on file for this visit.   --Continue cardiac medications as reconciled in final medication list. --Return in about 1 year (around 12/04/2022) for Follow up, Coronary artery calcification. Or sooner if needed. --Continue follow-up with your primary care physician regarding the management of your other chronic comorbid conditions.  Patient's questions and concerns were addressed to her satisfaction. She voices understanding of the instructions provided during this encounter.   This note was created using a voice recognition software as a result there may be grammatical errors inadvertently enclosed that do not reflect the nature of this encounter. Every attempt is made to correct such errors.  Rex Kras, [place], [facility]  Pager: [phone] Office: [phone]

## 2021-12-09 ENCOUNTER — Other Ambulatory Visit: Payer: Self-pay

## 2021-12-09 ENCOUNTER — Ambulatory Visit: Payer: Medicare Other | Attending: Family Medicine | Admitting: Physical Therapy

## 2021-12-09 DIAGNOSIS — M19079 Primary osteoarthritis, unspecified ankle and foot: Secondary | ICD-10-CM | POA: Diagnosis not present

## 2021-12-09 DIAGNOSIS — R293 Abnormal posture: Secondary | ICD-10-CM | POA: Diagnosis present

## 2021-12-09 DIAGNOSIS — M545 Low back pain, unspecified: Secondary | ICD-10-CM | POA: Insufficient documentation

## 2021-12-09 DIAGNOSIS — M25571 Pain in right ankle and joints of right foot: Secondary | ICD-10-CM | POA: Insufficient documentation

## 2021-12-09 DIAGNOSIS — G8929 Other chronic pain: Secondary | ICD-10-CM | POA: Diagnosis present

## 2021-12-09 DIAGNOSIS — M47896 Other spondylosis, lumbar region: Secondary | ICD-10-CM | POA: Insufficient documentation

## 2021-12-09 NOTE — Therapy (Signed)
OUTPATIENT PHYSICAL THERAPY THORACOLUMBAR EVALUATION   Patient Name: Maria [name] MRN: [id number] DOB:1936-08-31, 86 y.o., female Today's Date: 12/09/2021   PT End of Session - 12/09/21 1751     Visit Number 1    Number of Visits 16    Date for PT Re-Evaluation 02/03/22    Authorization Type [employer] MCR    PT Start Time 0930    PT Stop Time 7001    PT Time Calculation (min) 45 min    Activity Tolerance Patient tolerated treatment well    Behavior During Therapy WFL for tasks assessed/performed             Past Medical History:  Diagnosis Date   Cataract    Complication of anesthesia    slow to awaken   Depression    Diverticulosis    Goiter, nodular    [place] of thyroid stable 7/08   Headache    hx of   Heart murmur    due to rheumatic fever as child   Hemochromatosis, hereditary ([place]) 09/01/2019   Mitral valve prolapse    Multiple thyroid nodules 11/24/2015   Neoplasm of lung, malignant ([place]) 2002   Non-small cell surgical tx   Osteoarthritis 11/14/2013   2010 intra-articular steroids X 3 S/P Physical Therapy in 12/14 TKR 12/11/13    Osteopenia    Dr Pamala Hurry   PONV (postoperative nausea and vomiting)    n/v   Sleep apnea    Vitamin D deficiency    Past Surgical History:  Procedure Laterality Date   ABDOMINAL HYSTERECTOMY  1998   No BSO, for dysfunctional menses   BLADDER SUSPENSION Bilateral    BREAST BIOPSY Right    COLONOSCOPY  2009 , 2014   Diverticulosis; Dr. Olevia Perches   LOBECTOMY  12/02   RU; no radiation or chemo, Dr. Arlyce Dice   TONSILLECTOMY     TOTAL KNEE ARTHROPLASTY Right 12/11/2013   Procedure: RIGHT TOTAL KNEE ARTHROPLASTY;  Surgeon: Gearlean Alf, MD;  Location: [facility];  Service: Orthopedics;  Laterality: Right;   TOTAL KNEE ARTHROPLASTY Left 06/28/2017   Procedure: LEFT TOTAL KNEE ARTHROPLASTY;  Surgeon: Gaynelle Arabian, MD;  Location: [facility];  Service: Orthopedics;  Laterality: Left;  Adductor Block   Patient Active Problem List   Diagnosis Date  Noted   Malignant neoplasm of right ovary ([place]) 09/18/2021   Coronary atherosclerosis due to calcified coronary lesion 09/17/2021   Degenerative cervical spinal stenosis 02/03/2021   Routine general medical examination at a health care facility 02/03/2021   Weakness of right hand 12/31/2020   Arthritis of midfoot 06/10/2020   HTN (hypertension) 11/08/2019   Hemochromatosis 09/06/2019   Preventative health care 06/16/2016   OSA (obstructive sleep apnea) 06/15/2016   Depression 05/14/2016   Multiple thyroid nodules 11/24/2015   Degenerative arthritis of lumbar spine 09/03/2014   OA (osteoarthritis) of knee 12/11/2013   Osteoarthritis 11/14/2013   BENIGN POSITIONAL VERTIGO 01/17/2010   Urinary incontinence 04/26/2009   Hyperlipidemia, mixed 02/04/2009   Vitamin D deficiency 10/23/2008   Osteoporosis 10/23/2008   NEOPLASM, MALIGNANT, LUNG, NON-SMALL CELL 11/21/2007   DIVERTICULOSIS 06/15/2007   GOITER, NODULAR 04/14/2007    PCP: Janith Lima, MD  REFERRING PROVIDER: Lyndal Pulley, DO  REFERRING DIAG: [phone] (ICD-10-CM) - Other osteoarthritis of spine, lumbar region M19.079 (ICD-10-CM) - Arthritis of midfoot   THERAPY DIAG:  Chronic midline low back pain without sciatica  Pain in right ankle and joints of right foot  Abnormal posture  ONSET  DATE: chronic , worsening with recent diagnosis and recovery   SUBJECTIVE:                                                                                                                                                                                           SUBJECTIVE STATEMENT: This patient is familiar to me from about 3 yrs ago.  She presents with cc of low back pain that does not radiate, poor posture.  She has chronic Rt ankles pain which is progressively worsened.  She has begun to walk with a walker for safety as she walks her dog.  She has recently finished treatment for ovarian cancer.  She is sitting more than she wants to  be these days and would like to be stronger.   She is concerned about her Rt ankle giving way.   PERTINENT HISTORY:  Ovarian cancer 03/2021  - 08/08/21.  L arm pain for years , neck pain ,  memory issues from chemo   PAIN:  Are you having pain? Yes NPRS scale: 3/10 Pain location: low back  Pain orientation: Bilateral and Lower  PAIN TYPE: aching Pain description: intermittent  Aggravating factors: standing  Relieving factors: sitting    Are you having pain? Yes NPRS scale: 7/10 Pain location: R dorsum and lateral ankle  Pain orientation: R   PAIN TYPE: aching Pain description: intermittent  Aggravating factors: standing  Relieving factors: sitting   PRECAUTIONS: Other: cancer  WEIGHT BEARING RESTRICTIONS No  FALLS:  Has patient fallen in last 6 months? No, Number of falls: 0  LIVING ENVIRONMENT: Lives with: lives alone  Lives in: House/apartment Stairs: No;  Has following equipment at home:  3 wheeled walker, cane , walking stick, 4 wheeled walker/chair    OCCUPATION: retired   PLOF: Independent with household mobility without device, Independent with community mobility without device, Needs assistance with homemaking, and Leisure: grandkids, walk dog, lots of friends  PATIENT GOALS I want to be able to walk and maybe ride the bike    OBJECTIVE:   DIAGNOSTIC FINDINGS:  Xray R ankle 10/09/2021 IMPRESSION: 1. Prominent right ankle osteoarthritis.  No acute bony abnormality.     Xray lumbar 10/09/2021 IMPRESSION: 1. Stable multilevel lumbar spondylosis, facet hypertrophy, and right convex scoliosis. 2. No acute bony abnormality. 3. Suspected peritoneal calcification as above, compatible with the presumed ovarian cancer and peritoneal carcinomatosis seen on prior CT.  PATIENT SURVEYS:  FOTO emailed   COGNITION:  Overall cognitive status: Within functional limits for tasks assessed     SENSATION:  Light touch: Appears intact  Stereognosis: Appears  intact  Hot/Cold: Appears intact  Proprioception: Appears intact  MUSCLE LENGTH: Hamstrings:  Thomas test:   POSTURE:  Flexed posture, knees flexed  PALPATION: Pain along Rt dorsum of foot and laterally.      LUMBARAROM/PROM  A/PROM A/PROM  12/09/2021  Flexion 25% min pain   Extension 75% limited tight but no pain   Right lateral flexion NT  Left lateral flexion NT  Right rotation WFL tight   Left rotation WFL tight   (Blank rows = not tested)  LE AROM/PROM:  A/PROM Right 12/09/2021 Left 12/09/2021  Hip flexion    Hip extension    Hip abduction    Hip adduction    Hip internal rotation    Hip external rotation    Knee flexion    Knee extension    Ankle dorsiflexion    Ankle plantarflexion    Ankle inversion    Ankle eversion     (Blank rows = not tested)  LE MMT:  MMT Right 12/09/2021 Left 12/09/2021  Hip flexion 4/5 4+/5  Hip extension    Hip abduction    Hip adduction    Hip internal rotation    Hip external rotation    Knee flexion    Knee extension    Ankle dorsiflexion 5 5  Ankle plantarflexion 4 NT  Ankle inversion 4/5 pain  5  Ankle eversion 4-/5 pain  4   (Blank rows = not tested)  LUMBAR SPECIAL TESTS:  NT  FUNCTIONAL TESTS:  5 times sit to stand: TBA Timed up and go (TUG): TBA Berg Balance Scale: TBA  GAIT: Distance walked: 150 Assistive device utilized: None Level of assistance: Modified independence Comments: trunk flexed     TODAY'S TREATMENT  PT eval    PATIENT EDUCATION:  Education details: POC Person educated: Patient Education method: Explanation Education comprehension: verbalized understanding   HOME EXERCISE PROGRAM: TBA  ASSESSMENT:  CLINICAL IMPRESSION: Patient is a 87  y.o. female who was seen today for physical therapy evaluation and treatment for Rt ankle OA and chronic LBP. Objective impairments include Abnormal gait, decreased activity tolerance, decreased balance, decreased mobility, difficulty  walking, decreased ROM, decreased strength, increased fascial restrictions, impaired flexibility, impaired UE functional use, postural dysfunction, and pain. These impairments are limiting patient from cleaning, community activity, meal prep, laundry, shopping, and recreation . Personal factors including Age, Past/current experiences, Time since onset of injury/illness/exacerbation, and 1-2 comorbidities: cancer, bilateral TKA, back pain chronic   are also affecting patient's functional outcome. Patient will benefit from skilled PT to address above impairments and improve overall function.  REHAB POTENTIAL: Good  CLINICAL DECISION MAKING: Stable/uncomplicated  EVALUATION COMPLEXITY: Low   GOALS: Goals reviewed with patient? No  SHORT TERM GOALS:  STG Name Target Date Goal status  1 Pt will be independent with HEP for ankle, foot and trunk Baseline:  12/24/2021 INITIAL  2 Pt will be able to report less pain in ankle when walking in her home, < 5/10 Baseline:  01/07/2022 INITIAL  3 Pt will be able to safely walk her dog short distances with walker and improved  confidence in her ankle, balance.  Baseline: 01/07/2022 INITIAL  4 Pt will report back pain reduced following HEP and PT intervention Baseline: 01/07/2022 INITIAL  5 Pt will be screened for balance and goal set  12/24/21 INIITAL   LONG TERM GOALS:   LTG Name Target Date Goal status  1 Pt will improve FOTO score based on initial result  Baseline: 02/04/2022 INITIAL  2 Pt will be  able to complete basic home tasks , light housework with improved levels of back pain most of the time (< 5/10)  Baseline: 02/04/2022 INITIAL  3 Pt will be able to increase hip abduction strength to 4+/5 or better bilaterally in order to show improved gait stability  Baseline: 02/04/2022 INITIAL  4 Pt will be able to retunr to the [place] [facility] for lifelong fitness and health with pain managed Baseline: 02/04/2022 INITIAL  5 Pt will be I with HEP for balance and  strength upon discharge  Baseline: 02/04/2022 INITIAL  6 Balance goal TBA based on initial score  Baseline: 02/04/2022 INITIAL  PLAN: PT FREQUENCY: 2x/week  PT DURATION: 8 weeks  PLANNED INTERVENTIONS: Therapeutic exercises, Therapeutic activity, Neuro Muscular re-education, Balance training, Gait training, Patient/Family education, Joint mobilization, Aquatic Therapy, Dry Needling, Moist heat, and Manual therapy  PLAN FOR NEXT SESSION: ankle HEP and trunk HEP, balance screen, did she do FOTO?   Raeford Razor, PT 12/10/21 5:45 AM Phone: [phone] Fax: [phone]  Taggart Prasad, PT 12/09/2021, 5:53 PM

## 2021-12-10 ENCOUNTER — Other Ambulatory Visit: Payer: Self-pay

## 2021-12-10 ENCOUNTER — Ambulatory Visit: Payer: Medicare Other | Admitting: Physical Therapy

## 2021-12-10 DIAGNOSIS — M545 Low back pain, unspecified: Secondary | ICD-10-CM | POA: Diagnosis not present

## 2021-12-10 DIAGNOSIS — R293 Abnormal posture: Secondary | ICD-10-CM

## 2021-12-10 DIAGNOSIS — M25571 Pain in right ankle and joints of right foot: Secondary | ICD-10-CM

## 2021-12-10 NOTE — Therapy (Signed)
OUTPATIENT PHYSICAL THERAPY TREATMENT NOTE   Patient Name: Maria [name] MRN: [id number] DOB:June 28, 1936, 86 y.o., female Today's Date: 12/10/2021  PCP: Janith Lima, MD REFERRING PROVIDER: Lyndal Pulley, DO   PT End of Session - 12/10/21 [phone]     Visit Number 2    Number of Visits 16    Date for PT Re-Evaluation 02/03/22    Authorization Type [employer] MCR    PT Start Time 0933    PT Stop Time 7341    PT Time Calculation (min) 45 min    Activity Tolerance Patient tolerated treatment well    Behavior During Therapy [facility] for tasks assessed/performed             Past Medical History:  Diagnosis Date   Cataract    Complication of anesthesia    slow to awaken   Depression    Diverticulosis    Goiter, nodular    [place] of thyroid stable 7/08   Headache    hx of   Heart murmur    due to rheumatic fever as child   Hemochromatosis, hereditary ([place]) 09/01/2019   Mitral valve prolapse    Multiple thyroid nodules 11/24/2015   Neoplasm of lung, malignant ([place]) 2002   Non-small cell surgical tx   Osteoarthritis 11/14/2013   2010 intra-articular steroids X 3 S/P Physical Therapy in 12/14 TKR 12/11/13    Osteopenia    Dr Pamala Hurry   PONV (postoperative nausea and vomiting)    n/v   Sleep apnea    Vitamin D deficiency    Past Surgical History:  Procedure Laterality Date   ABDOMINAL HYSTERECTOMY  1998   No BSO, for dysfunctional menses   BLADDER SUSPENSION Bilateral    BREAST BIOPSY Right    COLONOSCOPY  2009 , 2014   Diverticulosis; Dr. Olevia Perches   LOBECTOMY  12/02   RU; no radiation or chemo, Dr. 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   Coronary atherosclerosis due to calcified coronary lesion 09/17/2021   Degenerative cervical spinal stenosis 02/03/2021   Routine general medical examination at a health care facility 02/03/2021   Weakness of right hand 12/31/2020   Arthritis of midfoot 06/10/2020   HTN (hypertension) 11/08/2019   Hemochromatosis 09/06/2019   Preventative health care 06/16/2016   OSA (obstructive sleep apnea) 06/15/2016   Depression 05/14/2016   Multiple thyroid nodules 11/24/2015   Degenerative arthritis of lumbar spine 09/03/2014   OA (osteoarthritis) of knee 12/11/2013   Osteoarthritis 11/14/2013   BENIGN POSITIONAL VERTIGO 01/17/2010   Urinary incontinence 04/26/2009   Hyperlipidemia, mixed 02/04/2009   Vitamin D deficiency 10/23/2008   Osteoporosis 10/23/2008   NEOPLASM, MALIGNANT, LUNG, NON-SMALL CELL 11/21/2007   DIVERTICULOSIS 06/15/2007   GOITER, NODULAR 04/14/2007    REFERRING DIAG: M47.896 (ICD-10-CM) - Other osteoarthritis of spine, lumbar region M19.079 (ICD-10-CM) - Arthritis of midfoot   THERAPY DIAG:  Chronic midline low back pain without sciatica  Pain in right ankle and joints of right foot  Abnormal posture  PERTINENT  HISTORY: total knee bilaterally, ovarian cancer with chemo, OA, osteopenia   PRECAUTIONS: cancer  SUBJECTIVE: I took the dog for a walk without the walker today.    PAIN:  Are you having pain? Yes NPRS scale: 4/10 Pain location: ankle Pain orientation: Right  PAIN TYPE: sore Pain description: intermittent  Aggravating factors: walking  Relieving factors: rest      OBJECTIVE:    DIAGNOSTIC FINDINGS:  Xray R ankle 10/09/2021 IMPRESSION: 1. Prominent right ankle osteoarthritis.  No acute bony abnormality.     Xray lumbar 10/09/2021 IMPRESSION: 1. Stable multilevel lumbar spondylosis, facet hypertrophy, and right  convex scoliosis. 2. No acute bony abnormality. 3. Suspected peritoneal calcification as above, compatible with the presumed ovarian cancer and peritoneal carcinomatosis seen on prior CT.   PATIENT SURVEYS:  FOTO done 12/10/21 47% able , goal is 58%   COGNITION:          Overall cognitive status: Within functional limits for tasks assessed      Increased time for processing HEP instructions today 12/10/21                   SENSATION:          Light touch: Appears intact          Stereognosis: Appears intact          Hot/Cold: Appears intact          Proprioception: Limited in bilateral fet, secondary to chemo.   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       BERG BALANCE  Sitting to Standing: Numbers; 0-4: 4  4. Stands without using hands and stabilize independently  3. Stands independently using hands  2. Stands using hands after multiple trials  1. Min A to stand  0. Mod-Max A to stand Standing unsupported: Numbers; 0-4: 4  4. Stands safely for 2 minutes  3. Stands 2 minutes with supervision  2. Stands 30 seconds unsupported  1. Needs several tries to stand unsupported for 30 seconds  0. Unable to stand unsupported for 30 seconds Sitting unsupported: Numbers; 0-4: 4  4. Sits for 2 minutes independently  3. Sits for 2 minutes with supervision  2. Able to sit 30 seconds  1. Able to sit 10 seconds  0. Unable to sit for 10 seconds Standing to Sitting: Numbers; 0-4: 4 4. Sits safely with minimal use of hands 3. Controls descent with hands 2. Uses back of legs against chair to control descent 1. Sits independently, but uncontrolled descent 0. Needs  assistance Transfers: Numbers; 0-4: 4  4. Transfers safely with minor use of hands  3. Transfers safely definite use of hands  2. Transfers with verbal cueing/supervision  1. Needs 1 person assist  0. Needs 2 person assist  Standing with eyes closed: Numbers; 0-4: 4  4. Stands safely for 10 seconds  3. Stands 10 seconds with supervision   2. Able to stand for 3 seconds  1. Unable to keep eyes closed for 3 seconds, but is safe  0. Needs assist to keep from falling Standing with feet together: Numbers; 0-4: 4 4. Stands for 1 minute safely 3. Stands for 1 minute  with supervision 2. Unable to hold for 30 seconds  1. Needs help to attain position but can hold for 15 seconds  0. Needs help to attain position and unable to hold for 15 seconds Reaching forward with outstretched arm: Numbers; 0-4: 3  4. Reaches forward 10 inches  3. Reaches forward 5 inches  2. Reaches forward 2 inches  1. Reaches forward with supervision  0. Loses balance/requires assistace Retrieving object from the floor: Numbers; 0-4: 4 4. Able to pick up easily and safely 3. Able to pick up with supervision 2. Unable to pick up, but reaches within 1-2 inches independently 1. Unable to pick up and needs supervision 0. Unable/needs assistance to keep from falling  Turning to look behind: Numbers; 0-4: 4  4. Looks behind from both sides and weight shifts well  3. Looks behind one side only, other side less weight shift  2. Turns sideways only, maintains balance  1. Needs supervision when turning  0. Needs assistance  Turning 360 degrees: Numbers; 0-4: 2  4. Able to turn in </=4 seconds  3. Able to turn on one side in </= 4 seconds   2. Able to turn slowly, but safely  1. Needs supervision or verbal cueing  0. Needs assistance Place alternate foot on stool: Numbers; 0-4: 1 4. Completes 8 steps in 20 seconds 3. Completes 8 steps in >20 seconds 2. 4 steps without assistance/supervision 1. Completes >2 steps with  minimal assist 0. Unable, needs assist to keep from falling Standing with one foot in front: Numbers; 0-4: 2  4. Independent tandem for 30 seconds  3. Independent foot ahead for 30 seconds  2. Independent small step for 30 seconds  1. Needs help to step, but can hold for 15 seconds  0. Loses balance while standing/stepping Standing on one foot: Numbers; 0-4: 1 4. Holds >10 seconds 3. Holds 5-10 seconds 2. Holds >/=3 seconds  1. Holds <3 seconds 0. Unable   Total Score: 45/56   Indicates need for cane or AD out of the house    LUMBARAROM/PROM   A/PROM A/PROM  12/09/2021  Flexion 25% min pain   Extension 75% limited tight but no pain   Right lateral flexion NT  Left lateral flexion NT  Right rotation [facility] tight   Left rotation WFL tight   (Blank rows = not tested)   LE AROM/PROM:   A/PROM Right 12/09/2021 Left 12/09/2021  Hip flexion      Hip extension      Hip abduction      Hip adduction      Hip internal rotation      Hip external rotation      Knee flexion      Knee extension      Ankle dorsiflexion 2   0  Ankle plantarflexion 60   60  Ankle inversion  40  45  Ankle eversion  18  32   (Blank rows = not tested)   LE MMT:   MMT Right 12/09/2021 Left 12/09/2021  Hip flexion 4/5 4+/5  Hip extension      Hip abduction      Hip adduction      Hip internal rotation      Hip external rotation      Knee flexion      Knee extension      Ankle dorsiflexion 5 5  Ankle plantarflexion 4 NT  Ankle inversion 4/5 pain  5  Ankle eversion 4-/5 pain  4   (Blank rows = not tested)   LUMBAR SPECIAL TESTS:  NT   FUNCTIONAL TESTS:  5 times sit to stand: TBA Timed up and go (TUG): TBA Berg Balance Scale: see above    GAIT: Distance walked: 150 Assistive device utilized: None Level of assistance: Modified independence Comments: trunk flexed        TODAY'S TREATMENT  [facility] Adult PT Treatment:                                                DATE: 12/10/21 Therapeutic  Exercise: Ankle theraband (red) Inversion and eversion, multiple ways to simplify Measured AROM bilateral Seated hamstring Standing calf stretch at wall  Therapeutic Activity: Berg Balance see above  Self Care: HEP and balance       PATIENT EDUCATION:  Education details: POC, berg  Person educated: Patient Education method: Explanation Education comprehension: verbalized understanding     HOME EXERCISE PROGRAM:  Access Code: [facility] URL: [email or web address] Date: 12/10/2021 Prepared by: Raeford Razor  Exercises Seated Ankle Inversion with Resistance and Legs Crossed - 2 x daily - 7 x weekly - 2 sets - 10 reps - 5 hold Seated Ankle Eversion with Resistance - 2 x daily - 7 x weekly - 2 sets - 10 reps - 5 hold Standing Gastroc Stretch - 2 x daily - 7 x weekly - 1 sets - 5 reps - 30 hold Seated hamstring     ASSESSMENT:   CLINICAL IMPRESSION: Patient here for 1st treatment, able to establish HEP in ankle and lower leg.  She does have the need for increased time and processing instructions and motor planning. Her Berg score indicates the needs for an asst. Device when in the community.  Berg testing reveled problems with coordination , felt more internally than able to be seen visually.    REHAB POTENTIAL: Good   CLINICAL DECISION MAKING: Stable/uncomplicated   EVALUATION COMPLEXITY: Low     GOALS: Goals reviewed with patient? No   SHORT TERM GOALS:   STG Name Target Date Goal status  1 Pt will be independent with HEP for ankle, foot and trunk Baseline:  12/24/2021 INITIAL  2 Pt will be able to report less pain in ankle when walking in her home, < 5/10 Baseline:  01/07/2022 INITIAL  3 Pt will be able to safely walk her dog short distances with walker and improved  confidence in her ankle, balance.  Baseline: 01/07/2022 INITIAL  4 Pt will report back pain reduced following HEP and PT intervention Baseline: 01/07/2022 INITIAL  5 Pt will be screened for  balance and goal set  12/24/21 INIITAL   LONG TERM GOALS:    LTG Name Target Date Goal status  1 Pt will improve FOTO score based on initial result  Baseline: 02/04/2022 INITIAL  2 Pt will be able to complete basic home tasks , light housework with improved levels of back pain most of the time (< 5/10)  Baseline: 02/04/2022 INITIAL  3 Pt will be able to increase hip abduction strength to 4+/5 or better bilaterally in order to show improved gait stability  Baseline: 02/04/2022 INITIAL  4 Pt will be able to retunr to the [place] [facility] with pain managed Baseline: 02/04/2022 INITIAL  5 Pt will be I with HEP for balance and strength upon discharge  Baseline: 02/04/2022 INITIAL  6 Balance goal TBA based on initial score  Baseline: 02/04/2022 INITIAL  PLAN: PT FREQUENCY: 2x/week   PT DURATION: 8 weeks   PLANNED INTERVENTIONS: Therapeutic exercises, Therapeutic activity, Neuro Muscular re-education, Balance training, Gait training, Patient/Family education, Joint mobilization, Aquatic Therapy, Dry Needling, Moist heat, and Manual therapy   PLAN FOR NEXT SESSION: check ankle HEP and trunk HEP, mat level hip and core ex     Ramina Hulet, PT 12/10/2021, 11:03 AM    Raeford Razor, PT 12/10/21 11:11 AM Phone: [phone] Fax: [phone]

## 2021-12-16 ENCOUNTER — Encounter: Payer: Self-pay | Admitting: Physical Therapy

## 2021-12-16 ENCOUNTER — Other Ambulatory Visit: Payer: Self-pay

## 2021-12-16 ENCOUNTER — Ambulatory Visit: Payer: Medicare Other | Admitting: Physical Therapy

## 2021-12-16 DIAGNOSIS — R293 Abnormal posture: Secondary | ICD-10-CM

## 2021-12-16 DIAGNOSIS — M545 Low back pain, unspecified: Secondary | ICD-10-CM | POA: Diagnosis not present

## 2021-12-16 DIAGNOSIS — M25571 Pain in right ankle and joints of right foot: Secondary | ICD-10-CM

## 2021-12-16 NOTE — Therapy (Signed)
OUTPATIENT PHYSICAL THERAPY TREATMENT NOTE   Patient Name: Maria [name] MRN: [id number] DOB:1936/09/23, 86 y.o., female Today's Date: 12/16/2021  PCP: Janith Lima, MD REFERRING PROVIDER: Janith Lima, MD   PT End of Session - 12/16/21 1153     Visit Number 3    Number of Visits 16    Date for PT Re-Evaluation 02/03/22    Authorization Type [employer] MCR    PT Start Time 1148    PT Stop Time 1229    PT Time Calculation (min) 41 min             Past Medical History:  Diagnosis Date   Cataract    Complication of anesthesia    slow to awaken   Depression    Diverticulosis    Goiter, nodular    [place] of thyroid stable 7/08   Headache    hx of   Heart murmur    due to rheumatic fever as child   Hemochromatosis, hereditary ([place]) 09/01/2019   Mitral valve prolapse    Multiple thyroid nodules 11/24/2015   Neoplasm of lung, malignant ([place]) 2002   Non-small cell surgical tx   Osteoarthritis 11/14/2013   2010 intra-articular steroids X 3 S/P Physical Therapy in 12/14 TKR 12/11/13    Osteopenia    Dr Pamala Hurry   PONV (postoperative nausea and vomiting)    n/v   Sleep apnea    Vitamin D deficiency    Past Surgical History:  Procedure Laterality Date   ABDOMINAL HYSTERECTOMY  1998   No BSO, for dysfunctional menses   BLADDER SUSPENSION Bilateral    BREAST BIOPSY Right    COLONOSCOPY  2009 , 2014   Diverticulosis; Dr. Olevia Perches   LOBECTOMY  12/02   RU; no radiation or chemo, Dr. 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[place])  09/18/2021   Coronary atherosclerosis due to calcified coronary lesion 09/17/2021   Degenerative cervical spinal stenosis 02/03/2021   Routine general medical examination at a health care facility 02/03/2021   Weakness of right hand 12/31/2020   Arthritis of midfoot 06/10/2020   HTN (hypertension) 11/08/2019   Hemochromatosis 09/06/2019   Preventative health care 06/16/2016   OSA (obstructive sleep apnea) 06/15/2016   Depression 05/14/2016   Multiple thyroid nodules 11/24/2015   Degenerative arthritis of lumbar spine 09/03/2014   OA (osteoarthritis) of knee 12/11/2013   Osteoarthritis 11/14/2013   BENIGN POSITIONAL VERTIGO 01/17/2010   Urinary incontinence 04/26/2009   Hyperlipidemia, mixed 02/04/2009   Vitamin D deficiency 10/23/2008   Osteoporosis 10/23/2008   NEOPLASM, MALIGNANT, LUNG, NON-SMALL CELL 11/21/2007   DIVERTICULOSIS 06/15/2007   GOITER, NODULAR 04/14/2007    REFERRING DIAG: M47.896 (ICD-10-CM) - Other osteoarthritis of spine, lumbar region M19.079 (ICD-10-CM) - Arthritis of midfoot   THERAPY DIAG:  Chronic midline low back pain without sciatica  Abnormal posture  Pain in right ankle and joints of right foot  PERTINENT HISTORY: total knee bilaterally, ovarian cancer with chemo, OA, osteopenia   PRECAUTIONS: cancer  SUBJECTIVE: I took the  dog for a walk without the walker today.    PAIN:  Are you having pain? Yes NPRS scale: 4/10 Pain location: ankle Pain orientation: Right  PAIN TYPE: sore Pain description: intermittent  Aggravating factors: walking  Relieving factors: rest      OBJECTIVE:    DIAGNOSTIC FINDINGS:  Xray R ankle 10/09/2021 IMPRESSION: 1. Prominent right ankle osteoarthritis.  No acute bony abnormality.     Xray lumbar 10/09/2021 IMPRESSION: 1. Stable multilevel lumbar spondylosis, facet hypertrophy, and right convex scoliosis. 2. No acute bony abnormality. 3. Suspected peritoneal calcification as above, compatible with  the presumed ovarian cancer and peritoneal carcinomatosis seen on prior CT.   PATIENT SURVEYS:  FOTO done 12/10/21 47% able , goal is 58%   COGNITION:          Overall cognitive status: Within functional limits for tasks assessed      Increased time for processing HEP instructions today 12/10/21                   SENSATION:          Light touch: Appears intact          Stereognosis: Appears intact          Hot/Cold: Appears intact          Proprioception: Limited in bilateral fet, secondary to chemo.   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       BERG BALANCE  Sitting to Standing: Numbers; 0-4: 4  4. Stands without using hands and stabilize independently  3. Stands independently using hands  2. Stands using hands after multiple trials  1. Min A to stand  0. Mod-Max A to stand Standing unsupported: Numbers; 0-4: 4  4. Stands safely for 2 minutes  3. Stands 2 minutes with supervision  2. Stands 30 seconds unsupported  1. Needs several tries to stand unsupported for 30 seconds  0. Unable to stand unsupported for 30 seconds Sitting unsupported: Numbers; 0-4: 4  4. Sits for 2 minutes independently  3. Sits for 2 minutes with supervision  2. Able to sit 30 seconds  1. Able to sit 10 seconds  0. Unable to sit for 10 seconds Standing to Sitting: Numbers; 0-4: 4 4. Sits safely with minimal use of hands 3. Controls descent with hands 2. Uses back of legs against chair to control descent 1. Sits independently, but uncontrolled descent 0. Needs assistance Transfers: Numbers; 0-4: 4  4. Transfers safely with minor use of hands  3. Transfers safely definite use of  hands  2. Transfers with verbal cueing/supervision  1. Needs 1 person assist  0. Needs 2 person assist  Standing with eyes closed: Numbers; 0-4: 4  4. Stands safely for 10 seconds  3. Stands 10 seconds with supervision   2. Able to stand for 3 seconds  1. Unable to keep eyes closed for 3 seconds, but is safe  0. Needs assist to keep from falling Standing with feet together: Numbers; 0-4: 4 4. Stands for 1 minute safely 3. Stands for 1 minute with supervision 2. Unable to hold for 30 seconds  1. Needs help to attain position but can hold  for 15 seconds  0. Needs help to attain position and unable to hold for 15 seconds Reaching forward with outstretched arm: Numbers; 0-4: 3  4. Reaches forward 10 inches  3. Reaches forward 5 inches  2. Reaches forward 2 inches  1. Reaches forward with supervision  0. Loses balance/requires assistace Retrieving object from the floor: Numbers; 0-4: 4 4. Able to pick up easily and safely 3. Able to pick up with supervision 2. Unable to pick up, but reaches within 1-2 inches independently 1. Unable to pick up and needs supervision 0. Unable/needs assistance to keep from falling  Turning to look behind: Numbers; 0-4: 4  4. Looks behind from both sides and weight shifts well  3. Looks behind one side only, other side less weight shift  2. Turns sideways only, maintains balance  1. Needs supervision when turning  0. Needs assistance  Turning 360 degrees: Numbers; 0-4: 2  4. Able to turn in </=4 seconds  3. Able to turn on one side in </= 4 seconds   2. Able to turn slowly, but safely  1. Needs supervision or verbal cueing  0. Needs assistance Place alternate foot on stool: Numbers; 0-4: 1 4. Completes 8 steps in 20 seconds 3. Completes 8 steps in >20 seconds 2. 4 steps without assistance/supervision 1. Completes >2 steps with minimal assist 0. Unable, needs assist to keep from falling Standing with one foot in front: Numbers; 0-4: 2  4.  Independent tandem for 30 seconds  3. Independent foot ahead for 30 seconds  2. Independent small step for 30 seconds  1. Needs help to step, but can hold for 15 seconds  0. Loses balance while standing/stepping Standing on one foot: Numbers; 0-4: 1 4. Holds >10 seconds 3. Holds 5-10 seconds 2. Holds >/=3 seconds  1. Holds <3 seconds 0. Unable   Total Score: 45/56   Indicates need for cane or AD out of the house    LUMBARAROM/PROM   A/PROM A/PROM  12/09/2021  Flexion 25% min pain   Extension 75% limited tight but no pain   Right lateral flexion NT  Left lateral flexion NT  Right rotation [facility] tight   Left rotation WFL tight   (Blank rows = not tested)   LE AROM/PROM:   A/PROM Right 12/09/2021 Left 12/09/2021  Hip flexion      Hip extension      Hip abduction      Hip adduction      Hip internal rotation      Hip external rotation      Knee flexion      Knee extension      Ankle dorsiflexion 2   0  Ankle plantarflexion 60   60  Ankle inversion  40  45  Ankle eversion  18  32   (Blank rows = not tested)   LE MMT:   MMT Right 12/09/2021 Left 12/09/2021  Hip flexion 4/5 4+/5  Hip extension      Hip abduction      Hip adduction      Hip internal rotation      Hip external rotation      Knee flexion      Knee extension      Ankle dorsiflexion 5 5  Ankle plantarflexion 4 NT  Ankle inversion 4/5 pain  5  Ankle eversion 4-/5 pain  4   (Blank rows = not tested)   LUMBAR SPECIAL TESTS:  NT   FUNCTIONAL TESTS:  5 times sit to stand: 12/16/21: 23.5 sec (without UE) Timed up and go (TUG): 12/16/21: 16.3 sec Berg Balance Scale:12/10/21: 45/56   GAIT: Distance walked: 150 Assistive device utilized: None Level of assistance: Modified independence Comments: trunk flexed    [facility] Adult PT Treatment:                                                DATE: 12/16/21 Therapeutic Exercise:  Nustep L5 UE/Le x 5 min TUG =-see above 5 X STS- see above Ankle theraband  (red) Inversion and eversion, multiple ways to simplify Standing calf stretch at wall     TODAY'S TREATMENT  [facility] Adult PT Treatment:                                                DATE: 12/10/21 Therapeutic Exercise: Ankle theraband (red) Inversion and eversion, multiple ways to simplify Measured AROM bilateral Seated hamstring Standing calf stretch at wall  Therapeutic Activity: Berg Balance see above  Self Care: HEP and balance       PATIENT EDUCATION:  Education details: continue HEP Person educated: Patient Education method: Explanation Education comprehension: verbalized understanding     HOME EXERCISE PROGRAM:  Access Code: [facility] URL: [email or web address] Date: 12/10/2021 Prepared by: Raeford Razor  Exercises Seated Ankle Inversion with Resistance and Legs Crossed - 2 x daily - 7 x weekly - 2 sets - 10 reps - 5 hold Seated Ankle Eversion with Resistance - 2 x daily - 7 x weekly - 2 sets - 10 reps - 5 hold Standing Gastroc Stretch - 2 x daily - 7 x weekly - 1 sets - 5 reps - 30 hold Seated hamstring     ASSESSMENT:   CLINICAL IMPRESSION: Patient here for 2nd treatment, has not performed HEP. Reviewed and she has difficulty with theraband. Used towel slides and 1# with good result. Reviewed band anchored on table leg as an option. Reviewed calf stretch and captured objective measures.    REHAB POTENTIAL: Good   CLINICAL DECISION MAKING: Stable/uncomplicated   EVALUATION COMPLEXITY: Low     GOALS: Goals reviewed with patient? No   SHORT TERM GOALS:   STG Name Target Date Goal status  1 Pt will be independent with HEP for ankle, foot and trunk Baseline:  12/24/2021 INITIAL  2 Pt will be able to report less pain in ankle when walking in her home, < 5/10 Baseline:  01/07/2022 INITIAL  3 Pt will be able to safely walk her dog short distances with walker and improved  confidence in her ankle, balance.  Baseline: 01/07/2022 INITIAL  4 Pt will  report back pain reduced following HEP and PT intervention Baseline: 01/07/2022 INITIAL  5 Pt will be screened for balance and goal set  12/24/21 INIITAL   LONG TERM GOALS:    LTG Name Target Date Goal status  1 Pt will improve FOTO score based on initial result  Baseline: 02/04/2022 INITIAL  2 Pt will be able to complete basic home tasks , light housework with improved levels of back pain most of the time (< 5/10)  Baseline: 02/04/2022 INITIAL  3 Pt will be able to increase hip abduction strength to 4+/5 or better bilaterally in  order to show improved gait stability  Baseline: 02/04/2022 INITIAL  4 Pt will be able to retunr to the [place] [facility] for lifelong fitness and health with pain managed Baseline: 02/04/2022 INITIAL  5 Pt will be I with HEP for balance and strength upon discharge  Baseline: 02/04/2022 INITIAL  6 Balance goal TBA based on initial score  Baseline: 02/04/2022 INITIAL  PLAN: PT FREQUENCY: 2x/week   PT DURATION: 8 weeks   PLANNED INTERVENTIONS: Therapeutic exercises, Therapeutic activity, Neuro Muscular re-education, Balance training, Gait training, Patient/Family education, Joint mobilization, Aquatic Therapy, Dry Needling, Moist heat, and Manual therapy   PLAN FOR NEXT SESSION: check ankle HEP and trunk HEP, mat level hip and core ex  , set goals for functional tests- see results above.    Dorene Ar, PTA 12/16/2021, 12:27 PM

## 2021-12-19 ENCOUNTER — Ambulatory Visit: Payer: Medicare Other | Admitting: Physical Therapy

## 2021-12-19 ENCOUNTER — Encounter: Payer: Self-pay | Admitting: Physical Therapy

## 2021-12-19 ENCOUNTER — Other Ambulatory Visit: Payer: Self-pay

## 2021-12-19 DIAGNOSIS — G8929 Other chronic pain: Secondary | ICD-10-CM

## 2021-12-19 DIAGNOSIS — M545 Low back pain, unspecified: Secondary | ICD-10-CM

## 2021-12-19 DIAGNOSIS — M25571 Pain in right ankle and joints of right foot: Secondary | ICD-10-CM

## 2021-12-19 DIAGNOSIS — R293 Abnormal posture: Secondary | ICD-10-CM

## 2021-12-19 NOTE — Therapy (Signed)
OUTPATIENT PHYSICAL THERAPY TREATMENT NOTE   Patient Name: Maria [name] MRN: [id number] DOB:05-15-1936, 86 y.o., female Today's Date: 12/19/2021  PCP: Janith Lima, MD REFERRING PROVIDER: Janith Lima, MD   PT End of Session - 12/19/21 0949     Visit Number 4    Number of Visits 16    Date for PT Re-Evaluation 02/03/22    Authorization Type [employer] MCR    PT Start Time 0850    PT Stop Time 0935    PT Time Calculation (min) 45 min    Activity Tolerance Patient tolerated treatment well    Behavior During Therapy [facility] for tasks assessed/performed              Past Medical History:  Diagnosis Date   Cataract    Complication of anesthesia    slow to awaken   Depression    Diverticulosis    Goiter, nodular    [place] of thyroid stable 7/08   Headache    hx of   Heart murmur    due to rheumatic fever as child   Hemochromatosis, hereditary ([place]) 09/01/2019   Mitral valve prolapse    Multiple thyroid nodules 11/24/2015   Neoplasm of lung, malignant ([place]) 2002   Non-small cell surgical tx   Osteoarthritis 11/14/2013   2010 intra-articular steroids X 3 S/P Physical Therapy in 12/14 TKR 12/11/13    Osteopenia    Dr Pamala Hurry   PONV (postoperative nausea and vomiting)    n/v   Sleep apnea    Vitamin D deficiency    Past Surgical History:  Procedure Laterality Date   ABDOMINAL HYSTERECTOMY  1998   No BSO, for dysfunctional menses   BLADDER SUSPENSION Bilateral    BREAST BIOPSY Right    COLONOSCOPY  2009 , 2014   Diverticulosis; Dr. Olevia Perches   LOBECTOMY  12/02   RU; no radiation or chemo, Dr. 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   Coronary atherosclerosis due to calcified coronary lesion 09/17/2021   Degenerative cervical spinal stenosis 02/03/2021   Routine general medical examination at a health care facility 02/03/2021   Weakness of right hand 12/31/2020   Arthritis of midfoot 06/10/2020   HTN (hypertension) 11/08/2019   Hemochromatosis 09/06/2019   Preventative health care 06/16/2016   OSA (obstructive sleep apnea) 06/15/2016   Depression 05/14/2016   Multiple thyroid nodules 11/24/2015   Degenerative arthritis of lumbar spine 09/03/2014   OA (osteoarthritis) of knee 12/11/2013   Osteoarthritis 11/14/2013   BENIGN POSITIONAL VERTIGO 01/17/2010   Urinary incontinence 04/26/2009   Hyperlipidemia, mixed 02/04/2009   Vitamin D deficiency 10/23/2008   Osteoporosis 10/23/2008   NEOPLASM, MALIGNANT, LUNG, NON-SMALL CELL 11/21/2007   DIVERTICULOSIS 06/15/2007   GOITER, NODULAR 04/14/2007    REFERRING DIAG: M47.896 (ICD-10-CM) - Other osteoarthritis of spine, lumbar region M19.079 (ICD-10-CM) - Arthritis of midfoot   THERAPY DIAG:  Chronic midline low back pain without sciatica  Abnormal posture  Pain in right ankle and joints of right foot  PERTINENT HISTORY: total knee bilaterally, ovarian cancer with chemo, OA, osteopenia   PRECAUTIONS: cancer  SUBJECTIVE: The ankle exercises are difficult for me.  I have been doing the back exercises.  The chemo has damaged my memory. No back pain but it will be the end of the day.   PAIN:  Are you having pain? Yes NPRS scale: 4/10 Pain location: ankle Pain orientation: Right  PAIN TYPE: sore Pain description: intermittent  Aggravating factors: walking  Relieving factors: rest      OBJECTIVE:    DIAGNOSTIC FINDINGS:  Xray R ankle 10/09/2021 IMPRESSION: 1. Prominent right ankle osteoarthritis.  No acute bony abnormality.      Xray lumbar 10/09/2021 IMPRESSION: 1. Stable multilevel lumbar spondylosis, facet hypertrophy, and right convex scoliosis. 2. No acute bony abnormality. 3. Suspected peritoneal calcification as above, compatible with the presumed ovarian cancer and peritoneal carcinomatosis seen on prior CT.   PATIENT SURVEYS:  FOTO done 12/10/21 47% able , goal is 58%   COGNITION:          Overall cognitive status: Within functional limits for tasks assessed      Increased time for processing HEP instructions today 12/10/21                   SENSATION:          Light touch: Appears intact          Stereognosis: Appears intact          Hot/Cold: Appears intact          Proprioception: Limited in bilateral fet, secondary to chemo.   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       BERG BALANCE  Sitting to Standing: Numbers; 0-4: 4  4. Stands without using hands and stabilize independently  3. Stands independently using hands  2. Stands using hands after multiple trials  1. Min A to stand  0. Mod-Max A to stand Standing unsupported: Numbers; 0-4: 4  4. Stands safely for 2 minutes  3. Stands 2 minutes with supervision  2. Stands 30 seconds unsupported  1. Needs several tries to stand unsupported for 30 seconds  0. Unable to stand unsupported for 30 seconds Sitting unsupported: Numbers; 0-4: 4  4. Sits for 2 minutes independently  3. Sits for 2 minutes with supervision  2. Able to sit 30 seconds  1. Able to sit 10 seconds  0. Unable to sit for 10 seconds Standing to Sitting: Numbers; 0-4: 4 4. Sits safely with minimal use of hands 3. Controls descent with hands 2. Uses back of  legs against chair to control descent 1. Sits independently, but uncontrolled descent 0. Needs assistance Transfers: Numbers; 0-4: 4  4. Transfers safely with minor use of hands  3. Transfers safely definite use of hands  2. Transfers with verbal cueing/supervision  1. Needs 1 person assist  0. Needs 2 person assist  Standing with eyes closed: Numbers; 0-4: 4  4. Stands safely for 10 seconds  3. Stands 10 seconds with supervision   2. Able to stand for 3 seconds  1. Unable to keep eyes closed for 3 seconds, but is safe  0. Needs  assist to keep from falling Standing with feet together: Numbers; 0-4: 4 4. Stands for 1 minute safely 3. Stands for 1 minute with supervision 2. Unable to hold for 30 seconds  1. Needs help to attain position but can hold for 15 seconds  0. Needs help to attain position and unable to hold for 15 seconds Reaching forward with outstretched arm: Numbers; 0-4: 3  4. Reaches forward 10 inches  3. Reaches forward 5 inches  2. Reaches forward 2 inches  1. Reaches forward with supervision  0. Loses balance/requires assistace Retrieving object from the floor: Numbers; 0-4: 4 4. Able to pick up easily and safely 3. Able to pick up with supervision 2. Unable to pick up, but reaches within 1-2 inches independently 1. Unable to pick up and needs supervision 0. Unable/needs assistance to keep from falling  Turning to look behind: Numbers; 0-4: 4  4. Looks behind from both sides and weight shifts well  3. Looks behind one side only, other side less weight shift  2. Turns sideways only, maintains balance  1. Needs supervision when turning  0. Needs assistance  Turning 360 degrees: Numbers; 0-4: 2  4. Able to turn in </=4 seconds  3. Able to turn on one side in </= 4 seconds   2. Able to turn slowly, but safely  1. Needs supervision or verbal cueing  0. Needs assistance Place alternate foot on stool: Numbers; 0-4: 1 4. Completes 8 steps in 20 seconds 3.  Completes 8 steps in >20 seconds 2. 4 steps without assistance/supervision 1. Completes >2 steps with minimal assist 0. Unable, needs assist to keep from falling Standing with one foot in front: Numbers; 0-4: 2  4. Independent tandem for 30 seconds  3. Independent foot ahead for 30 seconds  2. Independent small step for 30 seconds  1. Needs help to step, but can hold for 15 seconds  0. Loses balance while standing/stepping Standing on one foot: Numbers; 0-4: 1 4. Holds >10 seconds 3. Holds 5-10 seconds 2. Holds >/=3 seconds  1. Holds <3 seconds 0. Unable   Total Score: 45/56   Indicates need for cane or AD out of the house    LUMBARAROM/PROM   A/PROM A/PROM  12/09/2021  Flexion 25% min pain   Extension 75% limited tight but no pain   Right lateral flexion NT  Left lateral flexion NT  Right rotation [facility] tight   Left rotation WFL tight   (Blank rows = not tested)   LE AROM/PROM:   A/PROM Right 12/09/2021 Left 12/09/2021  Hip flexion      Hip extension      Hip abduction      Hip adduction      Hip internal rotation      Hip external rotation      Knee flexion      Knee extension      Ankle dorsiflexion 2   0  Ankle plantarflexion 60   60  Ankle inversion  40  45  Ankle eversion  18  32   (Blank rows = not tested)   LE MMT:   MMT Right 12/09/2021 Left 12/09/2021  Hip flexion 4/5 4+/5  Hip extension      Hip abduction      Hip adduction      Hip internal rotation      Hip external rotation      Knee flexion      Knee extension  Ankle dorsiflexion 5 5  Ankle plantarflexion 4 NT  Ankle inversion 4/5 pain  5  Ankle eversion 4-/5 pain  4   (Blank rows = not tested)   LUMBAR SPECIAL TESTS:  NT   FUNCTIONAL TESTS:  5 times sit to stand: 12/16/21: 23.5 sec (without UE) Timed up and go (TUG): 12/16/21: 16.3 sec Berg Balance Scale:12/10/21: 45/56   GAIT: Distance walked: 150 Assistive device utilized: None Level of assistance: Modified  independence Comments: trunk flexed     [facility] Adult PT Treatment:                                                DATE: 12/19/21 Therapeutic Exercise: Sit to stand 8 lbs x 10  Wall slides facing the wall x 6 each LE forward, weightshifting for spine extension Doorway hip hinging  NuStep L 6 UE and LE for 6 min  Supine hamstring stretch/calf  3 x 30 sec  SLR x 10 each LE  Bridge x 10 hamstring bias  Ankle theraband (red)  with PT assist  x 15 Inversion and eversion, multiple ways to simplify Self Care: Multiple ways to stretch hamstrings  [facility] Adult PT Treatment:                                                DATE: 12/16/21 Therapeutic Exercise:  Nustep L5 UE/Le x 5 min TUG =-see above 5 X STS- see above Ankle theraband (red) Inversion and eversion, multiple ways to simplify Standing calf stretch at wall     TODAY'S TREATMENT  [facility] Adult PT Treatment:                                                DATE: 12/10/21 Therapeutic Exercise: Ankle theraband (red) Inversion and eversion, multiple ways to simplify Measured AROM bilateral Seated hamstring Standing calf stretch at wall  Therapeutic Activity: Berg Balance see above  Self Care: HEP and balance       PATIENT EDUCATION:  Education details: continue HEP Person educated: Patient Education method: Explanation Education comprehension: verbalized understanding     HOME EXERCISE PROGRAM:  Access Code: [facility] URL: [email or web address] Date: 12/10/2021 Prepared by: Raeford Razor  Exercises Seated Ankle Inversion with Resistance and Legs Crossed - 2 x daily - 7 x weekly - 2 sets - 10 reps - 5 hold Seated Ankle Eversion with Resistance - 2 x daily - 7 x weekly - 2 sets - 10 reps - 5 hold Standing Gastroc Stretch - 2 x daily - 7 x weekly - 1 sets - 5 reps - 30 hold Seated hamstring     ASSESSMENT:   CLINICAL IMPRESSION:     Patient worked on hip hinging and improving spine extension, posture.  No  increased pain.  Fatigued with arms up the wall exercise and min back pain increased briefly.   REHAB POTENTIAL: Good   CLINICAL DECISION MAKING: Stable/uncomplicated   EVALUATION COMPLEXITY: Low     GOALS: Goals reviewed with patient? No   SHORT TERM GOALS:   STG Name Target Date Goal status  1  Pt will be independent with HEP for ankle, foot and trunk Baseline:  12/24/2021 met  2 Pt will be able to report less pain in ankle when walking in her home, < 5/10 Baseline:  01/07/2022 INITIAL  3 Pt will be able to safely walk her dog short distances with walker and improved  confidence in her ankle, balance.  Baseline: 01/07/2022 INITIAL  4 Pt will report back pain reduced following HEP and PT intervention Baseline: 01/07/2022 INITIAL  5 Pt will be screened for balance and goal set  12/24/21 met  LONG TERM GOALS:    LTG Name Target Date Goal status  1 Pt will improve FOTO score based on initial result  Baseline: 02/04/2022 INITIAL  2 Pt will be able to complete basic home tasks , light housework with improved levels of back pain most of the time (< 5/10)  Baseline: 02/04/2022 INITIAL  3 Pt will be able to increase hip abduction strength to 4+/5 or better bilaterally in order to show improved gait stability  Baseline: 02/04/2022 INITIAL  4 Pt will be able to retunr to the [place] [facility] with pain managed Baseline: 02/04/2022 INITIAL  5 Pt will be I with HEP for balance and strength upon discharge  Baseline: 02/04/2022 INITIAL  6 Balance score will improve to 51/56 to demo reduced fall risk.   Baseline: 02/04/2022 INITIAL  PLAN: PT FREQUENCY: 2x/week   PT DURATION: 8 weeks   PLANNED INTERVENTIONS: Therapeutic exercises, Therapeutic activity, Neuro Muscular re-education, Balance training, Gait training, Patient/Family education, Joint mobilization, Aquatic Therapy, Dry Needling, Moist heat, and Manual therapy   PLAN FOR NEXT SESSION: check ankle HEP and trunk HEP, mat  level hip and core ex . Stand as tol,    Mykal Batiz, PT 12/19/2021, 9:50 AM  Raeford Razor, PT 12/19/21 9:51 AM Phone: [phone] Fax: [phone]

## 2021-12-24 ENCOUNTER — Ambulatory Visit: Payer: Medicare Other | Admitting: Physical Therapy

## 2021-12-26 ENCOUNTER — Encounter: Payer: Self-pay | Admitting: Physical Therapy

## 2021-12-26 ENCOUNTER — Ambulatory Visit: Payer: Medicare Other | Admitting: Physical Therapy

## 2021-12-26 ENCOUNTER — Other Ambulatory Visit: Payer: Self-pay

## 2021-12-26 DIAGNOSIS — M545 Low back pain, unspecified: Secondary | ICD-10-CM | POA: Diagnosis not present

## 2021-12-26 DIAGNOSIS — G8929 Other chronic pain: Secondary | ICD-10-CM

## 2021-12-26 DIAGNOSIS — M25571 Pain in right ankle and joints of right foot: Secondary | ICD-10-CM

## 2021-12-26 DIAGNOSIS — R293 Abnormal posture: Secondary | ICD-10-CM

## 2021-12-26 NOTE — Therapy (Signed)
OUTPATIENT PHYSICAL THERAPY TREATMENT NOTE   Patient Name: Maria [name] MRN: [id number] DOB:19-Dec-1935, 86 y.o., female Today's Date: 12/26/2021  PCP: Janith Lima, MD REFERRING PROVIDER: Janith Lima, MD   PT End of Session - 12/26/21 0930     Visit Number 5    Number of Visits 16    Date for PT Re-Evaluation 02/03/22    Authorization Type [employer] MCR    PT Start Time 0930    PT Stop Time 1012    PT Time Calculation (min) 42 min              Past Medical History:  Diagnosis Date   Cataract    Complication of anesthesia    slow to awaken   Depression    Diverticulosis    Goiter, nodular    [place] of thyroid stable 7/08   Headache    hx of   Heart murmur    due to rheumatic fever as child   Hemochromatosis, hereditary ([place]) 09/01/2019   Mitral valve prolapse    Multiple thyroid nodules 11/24/2015   Neoplasm of lung, malignant ([place]) 2002   Non-small cell surgical tx   Osteoarthritis 11/14/2013   2010 intra-articular steroids X 3 S/P Physical Therapy in 12/14 TKR 12/11/13    Osteopenia    Dr Pamala Hurry   PONV (postoperative nausea and vomiting)    n/v   Sleep apnea    Vitamin D deficiency    Past Surgical History:  Procedure Laterality Date   ABDOMINAL HYSTERECTOMY  1998   No BSO, for dysfunctional menses   BLADDER SUSPENSION Bilateral    BREAST BIOPSY Right    COLONOSCOPY  2009 , 2014   Diverticulosis; Dr. Olevia Perches   LOBECTOMY  12/02   RU; no radiation or chemo, Dr. 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[place])  09/18/2021   Coronary atherosclerosis due to calcified coronary lesion 09/17/2021   Degenerative cervical spinal stenosis 02/03/2021   Routine general medical examination at a health care facility 02/03/2021   Weakness of right hand 12/31/2020   Arthritis of midfoot 06/10/2020   HTN (hypertension) 11/08/2019   Hemochromatosis 09/06/2019   Preventative health care 06/16/2016   OSA (obstructive sleep apnea) 06/15/2016   Depression 05/14/2016   Multiple thyroid nodules 11/24/2015   Degenerative arthritis of lumbar spine 09/03/2014   OA (osteoarthritis) of knee 12/11/2013   Osteoarthritis 11/14/2013   BENIGN POSITIONAL VERTIGO 01/17/2010   Urinary incontinence 04/26/2009   Hyperlipidemia, mixed 02/04/2009   Vitamin D deficiency 10/23/2008   Osteoporosis 10/23/2008   NEOPLASM, MALIGNANT, LUNG, NON-SMALL CELL 11/21/2007   DIVERTICULOSIS 06/15/2007   GOITER, NODULAR 04/14/2007    REFERRING DIAG: M47.896 (ICD-10-CM) - Other osteoarthritis of spine, lumbar region M19.079 (ICD-10-CM) - Arthritis of midfoot   THERAPY DIAG:  Chronic midline low back pain without sciatica  Abnormal posture  Pain in right ankle and joints of right foot  PERTINENT HISTORY: total knee bilaterally, ovarian cancer with chemo, OA, osteopenia   PRECAUTIONS: cancer  SUBJECTIVE: Not as  much ankle pain. The ankle exercises are difficult for me but I like the band. I have been doing the back exercises.   No back pain but it will be the end of the day.   PAIN:  Are you having pain? Yes NPRS scale: 3/10 Pain location: ankle Pain orientation: Right  PAIN TYPE: sore Pain description: intermittent  Aggravating factors: walking  Relieving factors: rest      OBJECTIVE:    DIAGNOSTIC FINDINGS:  Xray R ankle 10/09/2021 IMPRESSION: 1. Prominent right ankle osteoarthritis.  No acute bony abnormality.     Xray lumbar 10/09/2021 IMPRESSION: 1. Stable multilevel lumbar spondylosis, facet hypertrophy,  and right convex scoliosis. 2. No acute bony abnormality. 3. Suspected peritoneal calcification as above, compatible with the presumed ovarian cancer and peritoneal carcinomatosis seen on prior CT.   PATIENT SURVEYS:  FOTO done 12/10/21 47% able , goal is 58%   COGNITION:          Overall cognitive status: Within functional limits for tasks assessed      Increased time for processing HEP instructions today 12/10/21                   SENSATION:          Light touch: Appears intact          Stereognosis: Appears intact          Hot/Cold: Appears intact          Proprioception: Limited in bilateral fet, secondary to chemo.   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       BERG BALANCE  Sitting to Standing: Numbers; 0-4: 4  4. Stands without using hands and stabilize independently  3. Stands independently using hands  2. Stands using hands after multiple trials  1. Min A to stand  0. Mod-Max A to stand Standing unsupported: Numbers; 0-4: 4  4. Stands safely for 2 minutes  3. Stands 2 minutes with supervision  2. Stands 30 seconds unsupported  1. Needs several tries to stand unsupported for 30 seconds  0. Unable to stand unsupported for 30 seconds Sitting unsupported: Numbers; 0-4: 4  4. Sits for 2 minutes independently  3. Sits for 2 minutes with supervision  2. Able to sit 30 seconds  1. Able to sit 10 seconds  0. Unable to sit for 10 seconds Standing to Sitting: Numbers; 0-4: 4 4. Sits safely with minimal use of hands 3. Controls descent with hands 2. Uses back of legs against chair to control descent 1. Sits independently, but uncontrolled descent 0. Needs  assistance Transfers: Numbers; 0-4: 4  4. Transfers safely with minor use of hands  3. Transfers safely definite use of hands  2. Transfers with verbal cueing/supervision  1. Needs 1 person assist  0. Needs 2 person assist  Standing with eyes closed: Numbers; 0-4: 4  4. Stands safely for 10 seconds  3. Stands 10 seconds with supervision   2. Able to stand for 3 seconds  1. Unable to keep eyes closed for 3 seconds, but is safe  0. Needs assist to keep from falling Standing with feet together: Numbers; 0-4: 4 4. Stands for  1 minute safely 3. Stands for 1 minute with supervision 2. Unable to hold for 30 seconds  1. Needs help to attain position but can hold for 15 seconds  0. Needs help to attain position and unable to hold for 15 seconds Reaching forward with outstretched arm: Numbers; 0-4: 3  4. Reaches forward 10 inches  3. Reaches forward 5 inches  2. Reaches forward 2 inches  1. Reaches forward with supervision  0. Loses balance/requires assistace Retrieving object from the floor: Numbers; 0-4: 4 4. Able to pick up easily and safely 3. Able to pick up with supervision 2. Unable to pick up, but reaches within 1-2 inches independently 1. Unable to pick up and needs supervision 0. Unable/needs assistance to keep from falling  Turning to look behind: Numbers; 0-4: 4  4. Looks behind from both sides and weight shifts well  3. Looks behind one side only, other side less weight shift  2. Turns sideways only, maintains balance  1. Needs supervision when turning  0. Needs assistance  Turning 360 degrees: Numbers; 0-4: 2  4. Able to turn in </=4 seconds  3. Able to turn on one side in </= 4 seconds   2. Able to turn slowly, but safely  1. Needs supervision or verbal cueing  0. Needs assistance Place alternate foot on stool: Numbers; 0-4: 1 4. Completes 8 steps in 20 seconds 3. Completes 8 steps in >20 seconds 2. 4 steps without assistance/supervision 1. Completes >2 steps with  minimal assist 0. Unable, needs assist to keep from falling Standing with one foot in front: Numbers; 0-4: 2  4. Independent tandem for 30 seconds  3. Independent foot ahead for 30 seconds  2. Independent small step for 30 seconds  1. Needs help to step, but can hold for 15 seconds  0. Loses balance while standing/stepping Standing on one foot: Numbers; 0-4: 1 4. Holds >10 seconds 3. Holds 5-10 seconds 2. Holds >/=3 seconds  1. Holds <3 seconds 0. Unable   Total Score: 45/56   Indicates need for cane or AD out of the house    LUMBARAROM/PROM   A/PROM A/PROM  12/09/2021  Flexion 25% min pain   Extension 75% limited tight but no pain   Right lateral flexion NT  Left lateral flexion NT  Right rotation [facility] tight   Left rotation WFL tight   (Blank rows = not tested)   LE AROM/PROM:   A/PROM Right 12/09/2021 Left 12/09/2021  Hip flexion      Hip extension      Hip abduction      Hip adduction      Hip internal rotation      Hip external rotation      Knee flexion      Knee extension      Ankle dorsiflexion 2   0  Ankle plantarflexion 60   60  Ankle inversion  40  45  Ankle eversion  18  32   (Blank rows = not tested)   LE MMT:   MMT Right 12/09/2021 Left 12/09/2021  Hip flexion 4/5 4+/5  Hip extension      Hip abduction      Hip adduction      Hip internal rotation      Hip external rotation      Knee flexion      Knee extension      Ankle dorsiflexion 5 5  Ankle plantarflexion 4 NT  Ankle inversion 4/5 pain  5  Ankle eversion 4-/5 pain  4   (Blank rows = not tested)   LUMBAR SPECIAL TESTS:  NT   FUNCTIONAL TESTS:  5 times sit to stand: 12/16/21: 23.5 sec (without UE) Timed up and go (TUG): 12/16/21: 16.3 sec Berg Balance Scale:12/10/21: 45/56   GAIT: Distance walked: 150 Assistive device utilized: None Level of assistance: Modified independence Comments: trunk flexed     [facility] Adult PT Treatment:                                                DATE:  12/26/21 Therapeutic Exercise: NuStep L 6 UE and LE for 6 min  Narrow stance EC Narrow stance on airex, head nods and turns  [employer] board stretch x 60 sec Ankle inversion isometric press x 15 Ankle red band inversion and eversion x 20 each - PTA assist  Supine hamstring stretch/calf  3 x 30 sec  SLR x 10 each LE  Bridge x 15 hamstring bias with initial PPT  Hooklying clam green  10 x 2  Sit to stand 8 lbs x 10     [facility] Adult PT Treatment:                                                DATE: 12/19/21 Therapeutic Exercise: Sit to stand 8 lbs x 10  Wall slides facing the wall x 6 each LE forward, weightshifting for spine extension Doorway hip hinging  NuStep L 6 UE and LE for 6 min  Supine hamstring stretch/calf  3 x 30 sec  SLR x 10 each LE  Bridge x 10 hamstring bias  Ankle theraband (red)  with PT assist  x 15 Inversion and eversion, multiple ways to simplify Self Care: Multiple ways to stretch hamstrings  [facility] Adult PT Treatment:                                                DATE: 12/16/21 Therapeutic Exercise:  Nustep L5 UE/Le x 5 min TUG =-see above 5 X STS- see above Ankle theraband (red) Inversion and eversion, multiple ways to simplify Standing calf stretch at wall     TODAY'S TREATMENT  [facility] Adult PT Treatment:                                                DATE: 12/10/21 Therapeutic Exercise: Ankle theraband (red) Inversion and eversion, multiple ways to simplify Measured AROM bilateral Seated hamstring Standing calf stretch at wall  Therapeutic Activity: Berg Balance see above  Self Care: HEP and balance       PATIENT EDUCATION:  Education details: continue HEP Person educated: Patient Education method: Explanation Education comprehension: verbalized understanding     HOME EXERCISE PROGRAM:  Access Code: [facility] URL: [email or web address] Date: 12/26/2021 Prepared by: Hessie Diener  Exercises Seated Ankle  Inversion with Resistance and Legs Crossed - 2 x daily - 7 x weekly -  2 sets - 10 reps - 5 hold Seated Ankle Eversion with Resistance - 2 x daily - 7 x weekly - 2 sets - 10 reps - 5 hold Standing Gastroc Stretch - 2 x daily - 7 x weekly - 1 sets - 5 reps - 30 hold Seated Hamstring Stretch - 2 x daily - 7 x weekly - 1 sets - 5 reps - 30 hold Hooklying Clamshell with Resistance - 2 x daily - 7 x weekly - 2 sets - 10 reps - 5 hold    ASSESSMENT:   CLINICAL IMPRESSION:    Challenged balance today in narrow stance and pt with LOB when adding EC or head turns, needing intermittent UE assist. She notes improvement in pain during ankle theraband exercises. Continued with Mat based core and hip strength. Her ankle MMT has improved. No increased pain.    REHAB POTENTIAL: Good   CLINICAL DECISION MAKING: Stable/uncomplicated   EVALUATION COMPLEXITY: Low     GOALS: Goals reviewed with patient? No   SHORT TERM GOALS:   STG Name Target Date Goal status  1 Pt will be independent with HEP for ankle, foot and trunk Baseline:  12/24/2021 met  2 Pt will be able to report less pain in ankle when walking in her home, < 5/10 Baseline:  01/07/2022 INITIAL  3 Pt will be able to safely walk her dog short distances with walker and improved  confidence in her ankle, balance.  Baseline: 01/07/2022 INITIAL  4 Pt will report back pain reduced following HEP and PT intervention Baseline: 01/07/2022 INITIAL  5 Pt will be screened for balance and goal set  12/24/21 met  LONG TERM GOALS:    LTG Name Target Date Goal status  1 Pt will improve FOTO score based on initial result  Baseline: 02/04/2022 INITIAL  2 Pt will be able to complete basic home tasks , light housework with improved levels of back pain most of the time (< 5/10)  Baseline: 02/04/2022 INITIAL  3 Pt will be able to increase hip abduction strength to 4+/5 or better bilaterally in order to show improved gait stability  Baseline: 02/04/2022 INITIAL  4 Pt  will be able to retunr to the [place] [facility] with pain managed Baseline: 02/04/2022 INITIAL  5 Pt will be I with HEP for balance and strength upon discharge  Baseline: 02/04/2022 INITIAL  6 Balance score will improve to 51/56 to demo reduced fall risk.   Baseline: 02/04/2022 INITIAL  PLAN: PT FREQUENCY: 2x/week   PT DURATION: 8 weeks   PLANNED INTERVENTIONS: Therapeutic exercises, Therapeutic activity, Neuro Muscular re-education, Balance training, Gait training, Patient/Family education, Joint mobilization, Aquatic Therapy, Dry Needling, Moist heat, and Manual therapy   PLAN FOR NEXT SESSION: check ankle HEP and trunk HEP, mat level hip and core ex . Stand as Hendricks Limes, PTA 12/26/21 10:08 AM Phone: [phone] Fax: [phone]

## 2021-12-30 ENCOUNTER — Encounter: Payer: Self-pay | Admitting: Physical Therapy

## 2021-12-30 ENCOUNTER — Other Ambulatory Visit: Payer: Self-pay | Admitting: Cardiology

## 2021-12-30 ENCOUNTER — Ambulatory Visit: Payer: Medicare Other | Admitting: Physical Therapy

## 2021-12-30 ENCOUNTER — Other Ambulatory Visit: Payer: Self-pay

## 2021-12-30 DIAGNOSIS — M25571 Pain in right ankle and joints of right foot: Secondary | ICD-10-CM

## 2021-12-30 DIAGNOSIS — I2510 Atherosclerotic heart disease of native coronary artery without angina pectoris: Secondary | ICD-10-CM

## 2021-12-30 DIAGNOSIS — M545 Low back pain, unspecified: Secondary | ICD-10-CM | POA: Diagnosis not present

## 2021-12-30 DIAGNOSIS — G8929 Other chronic pain: Secondary | ICD-10-CM

## 2021-12-30 DIAGNOSIS — R293 Abnormal posture: Secondary | ICD-10-CM

## 2021-12-30 NOTE — Therapy (Signed)
OUTPATIENT PHYSICAL THERAPY TREATMENT NOTE   Patient Name: Maria [name] MRN: [id number] DOB:[birth year], 86 y.o., female Today's Date: 12/30/2021  PCP: Etta Grandchild, MD REFERRING PROVIDER: Etta Grandchild, MD   PT End of Session - 12/30/21 0908     Visit Number 6    Number of Visits 16    Date for PT Re-Evaluation 02/03/22    Authorization Type UHC MCR    PT Start Time 0852    PT Stop Time 0935    PT Time Calculation (min) 43 min    Activity Tolerance Patient tolerated treatment well    Behavior During Therapy [facility] for tasks assessed/performed               Past Medical History:  Diagnosis Date   Cataract    Complication of anesthesia    slow to awaken   Depression    Diverticulosis    Goiter, nodular    [place] of thyroid stable 7/08   Headache    hx of   Heart murmur    due to rheumatic fever as child   Hemochromatosis, hereditary (HCC) 09/01/2019   Mitral valve prolapse    Multiple thyroid nodules 11/24/2015   Neoplasm of lung, malignant (HCC) 2002   Non-small cell surgical tx   Osteoarthritis 11/14/2013   2010 intra-articular steroids X 3 S/P Physical Therapy in 12/14 TKR 12/11/13    Osteopenia    Dr Ernestina Penna   PONV (postoperative nausea and vomiting)    n/v   Sleep apnea    Vitamin D deficiency    Past Surgical History:  Procedure Laterality Date   ABDOMINAL HYSTERECTOMY  1998   No BSO, for dysfunctional menses   BLADDER SUSPENSION Bilateral    BREAST BIOPSY Right    COLONOSCOPY  2009 , 2014   Diverticulosis; Dr. Juanda Chance   LOBECTOMY  12/02   RU; no radiation or chemo, Dr. 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   Coronary atherosclerosis due to calcified coronary lesion 09/17/2021   Degenerative cervical spinal stenosis 02/03/2021   Routine general medical examination at a health care facility 02/03/2021   Weakness of right hand 12/31/2020   Arthritis of midfoot 06/10/2020   HTN (hypertension) 11/08/2019   Hemochromatosis 09/06/2019   Preventative health care 06/16/2016   OSA (obstructive sleep apnea) 06/15/2016   Depression 05/14/2016   Multiple thyroid nodules 11/24/2015   Degenerative arthritis of lumbar spine 09/03/2014   OA (osteoarthritis) of knee 12/11/2013   Osteoarthritis 11/14/2013   BENIGN POSITIONAL VERTIGO 01/17/2010   Urinary incontinence 04/26/2009   Hyperlipidemia, mixed 02/04/2009   Vitamin D deficiency 10/23/2008   Osteoporosis 10/23/2008   NEOPLASM, MALIGNANT, LUNG, NON-SMALL CELL 11/21/2007   DIVERTICULOSIS 06/15/2007   GOITER, NODULAR 04/14/2007    REFERRING DIAG: M47.896 (ICD-10-CM) - Other osteoarthritis of spine, lumbar region M19.079 (ICD-10-CM) - Arthritis of midfoot   THERAPY DIAG:  Chronic midline low back pain without sciatica  Abnormal posture  Pain in right ankle and joints of right foot  PERTINENT HISTORY: total knee bilaterally, ovarian cancer with chemo, OA, osteopenia   PRECAUTIONS: cancer  SUBJECTIVE:   No ankle pain right now.  Back is good too. Ive been rushing around this AM, walking the dog etc.   PAIN:   Are you having pain? Yes NPRS scale: 30/10 Pain location: ankle Pain orientation: Right  PAIN TYPE: sore Pain description: intermittent  Aggravating factors: walking  Relieving factors: rest      OBJECTIVE:    DIAGNOSTIC FINDINGS:  Xray R ankle 10/09/2021 IMPRESSION: 1. Prominent right ankle osteoarthritis.  No acute bony abnormality.     Xray lumbar 10/09/2021 IMPRESSION: 1. Stable multilevel  lumbar spondylosis, facet hypertrophy, and right convex scoliosis. 2. No acute bony abnormality. 3. Suspected peritoneal calcification as above, compatible with the presumed ovarian cancer and peritoneal carcinomatosis seen on prior CT.   PATIENT SURVEYS:  FOTO done 12/10/21 47% able , goal is 58%   COGNITION:          Overall cognitive status: Within functional limits for tasks assessed      Increased time for processing HEP instructions today 12/10/21                   SENSATION:          Light touch: Appears intact          Stereognosis: Appears intact          Hot/Cold: Appears intact          Proprioception: Limited in bilateral fet, secondary to chemo.   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     Total Score: 45/56   Indicates need for cane or AD out of the house    LUMBARAROM/PROM   A/PROM A/PROM  12/09/2021  Flexion 25% min pain   Extension 75% limited tight but no pain   Right lateral flexion NT  Left lateral flexion NT  Right rotation [facility] tight   Left rotation WFL tight   (Blank rows = not tested)   LE AROM/PROM:   A/PROM Right 12/09/2021 Left 12/09/2021  Hip flexion      Hip extension      Hip abduction      Hip adduction      Hip internal rotation      Hip external rotation      Knee flexion      Knee extension      Ankle dorsiflexion 2   0  Ankle plantarflexion 60   60  Ankle inversion  40  45  Ankle eversion  18  32   (Blank rows = not tested)   LE MMT:   MMT Right 12/09/2021 Left 12/09/2021  Hip flexion 4/5 4+/5  Hip extension      Hip abduction      Hip adduction      Hip internal rotation      Hip external rotation      Knee  flexion      Knee extension      Ankle dorsiflexion 5 5  Ankle plantarflexion 4 NT  Ankle inversion 4/5 pain  5  Ankle eversion 4-/5 pain  4   (Blank rows =  not tested)   LUMBAR SPECIAL TESTS:  NT   FUNCTIONAL TESTS:  5 times sit to stand: 12/16/21: 23.5 sec (without UE) Timed up and go (TUG): 12/16/21: 16.3 sec Berg Balance Scale:12/10/21: 45/56   GAIT: Distance walked: 150 Assistive device utilized: None Level of assistance: Modified independence Comments: trunk flexed      [facility] Adult PT Treatment:                                                DATE: 12/30/21 Therapeutic Exercise: Pilates Reformer used for LE/core strength, postural strength, lumbopelvic disassociation and core control.  Exercises included: Footwork 2 red 1 blue parallel, heels, arch then toes Used ball for alignment Prancing for stretching heel/tendon 2 red single leg x 10   Bridging all springs with ball x 10   Feet in Straps 1 red 1 yellow  Arcs (range is small due to hamstring tightness and post pelvic tilt) Squats x 15 with PT assist for control Increased time for transitions to avoid dizziness  [facility] Adult PT Treatment:                                                DATE: 12/26/21 Therapeutic Exercise: NuStep L 6 UE and LE for 6 min  Narrow stance EC Narrow stance on airex, head nods and turns  [employer] board stretch x 60 sec Ankle inversion isometric press x 15 Ankle red band inversion and eversion x 20 each - PTA assist  Supine hamstring stretch/calf  3 x 30 sec  SLR x 10 each LE  Bridge x 15 hamstring bias with initial PPT  Hooklying clam green  10 x 2  Sit to stand 8 lbs x 10     [facility] Adult PT Treatment:                                                DATE: 12/19/21 Therapeutic Exercise: Sit to stand 8 lbs x 10  Wall slides facing the wall x 6 each LE forward, weightshifting for spine extension Doorway hip hinging  NuStep L 6 UE and LE for 6 min  Supine hamstring  stretch/calf  3 x 30 sec  SLR x 10 each LE  Bridge x 10 hamstring bias  Ankle theraband (red)  with PT assist  x 15 Inversion and eversion, multiple ways to simplify Self Care: Multiple ways to stretch hamstrings  [facility] Adult PT Treatment:                                                DATE: 12/16/21 Therapeutic Exercise:  Nustep L5 UE/Le x 5 min TUG =-see above 5 X STS- see above Ankle theraband (red) Inversion and eversion, multiple ways to simplify Standing calf stretch at wall     TODAY'S TREATMENT  [facility] Adult PT Treatment:  DATE: 12/10/21 Therapeutic Exercise: Ankle theraband (red) Inversion and eversion, multiple ways to simplify Measured AROM bilateral Seated hamstring Standing calf stretch at wall  Therapeutic Activity: Berg Balance see above  Self Care: HEP and balance       PATIENT EDUCATION:  Education details: continue HEP Person educated: Patient Education method: Explanation Education comprehension: verbalized understanding     HOME EXERCISE PROGRAM:  Access Code: [facility] URL: [email or web address] Date: 12/26/2021 Prepared by: Jannette Spanner  Exercises Seated Ankle Inversion with Resistance and Legs Crossed - 2 x daily - 7 x weekly - 2 sets - 10 reps - 5 hold Seated Ankle Eversion with Resistance - 2 x daily - 7 x weekly - 2 sets - 10 reps - 5 hold Standing Gastroc Stretch - 2 x daily - 7 x weekly - 1 sets - 5 reps - 30 hold Seated Hamstring Stretch - 2 x daily - 7 x weekly - 1 sets - 5 reps - 30 hold Hooklying Clamshell with Resistance - 2 x daily - 7 x weekly - 2 sets - 10 reps - 5 hold    ASSESSMENT:   CLINICAL IMPRESSION:    Pt able to do Pilates Reformer exercises today without increasing pain. Reformer provided feedback to joint of LE and hips/pelvis.  She had difficulty controlling LE with feet in straps but improved with repetition.  Cont POC.    REHAB POTENTIAL: Good    CLINICAL DECISION MAKING: Stable/uncomplicated   EVALUATION COMPLEXITY: Low     GOALS: Goals reviewed with patient? No   SHORT TERM GOALS:   STG Name Target Date Goal status  1 Pt will be independent with HEP for ankle, foot and trunk Baseline:  12/24/2021 met  2 Pt will be able to report less pain in ankle when walking in her home, < 5/10 Baseline:  01/07/2022 INITIAL  3 Pt will be able to safely walk her dog short distances with walker and improved  confidence in her ankle, balance.  Baseline: 01/07/2022 INITIAL  4 Pt will report back pain reduced following HEP and PT intervention Baseline: 01/07/2022 INITIAL  5 Pt will be screened for balance and goal set  12/24/21 met  LONG TERM GOALS:    LTG Name Target Date Goal status  1 Pt will improve FOTO score based on initial result  Baseline: 02/04/2022 INITIAL  2 Pt will be able to complete basic home tasks , light housework with improved levels of back pain most of the time (< 5/10)  Baseline: 02/04/2022 INITIAL  3 Pt will be able to increase hip abduction strength to 4+/5 or better bilaterally in order to show improved gait stability  Baseline: 02/04/2022 INITIAL  4 Pt will be able to retunr to the [place] [facility] lifelong [facility] and health with pain managed Baseline: 02/04/2022 INITIAL  5 Pt will be I with HEP for balance and strength upon discharge  Baseline: 02/04/2022 INITIAL  6 Balance score will improve to 51/56 to demo reduced fall risk.   Baseline: 02/04/2022 INITIAL  PLAN: PT FREQUENCY: 2x/week   PT DURATION: 8 weeks   PLANNED INTERVENTIONS: Therapeutic exercises, Therapeutic activity, Neuro Muscular re-education, Balance training, Gait training, Patient/Family education, Joint mobilization, Aquatic Therapy, Dry Needling, Moist heat, and Manual therapy   PLAN FOR NEXT SESSION:   Repeat Pilates  check ankle HEP and trunk HEP, mat level hip and core ex . Stand as Rock Nephew, PT 12/30/21 9:47 AM Phone:  [phone] Fax: [phone]

## 2021-12-31 ENCOUNTER — Ambulatory Visit: Payer: Medicare Other | Admitting: Physical Therapy

## 2022-01-01 NOTE — Therapy (Signed)
OUTPATIENT PHYSICAL THERAPY TREATMENT NOTE   Patient Name: Maria [name] MRN: [id number] DOB:07-22-1936, 86 y.o., female Today's Date: 01/02/2022  PCP: Janith Lima, MD REFERRING PROVIDER: Janith Lima, MD   PT End of Session - 01/02/22 0900     Visit Number 7    Number of Visits 16    Date for PT Re-Evaluation 02/03/22    Authorization Type UHC MCR    PT Start Time 0848    PT Stop Time 0933    PT Time Calculation (min) 45 min    Activity Tolerance Patient tolerated treatment well    Behavior During Therapy [facility] for tasks assessed/performed                Past Medical History:  Diagnosis Date   Cataract    Complication of anesthesia    slow to awaken   Depression    Diverticulosis    Goiter, nodular    [place] of thyroid stable 7/08   Headache    hx of   Heart murmur    due to rheumatic fever as child   Hemochromatosis, hereditary ([place]) 09/01/2019   Mitral valve prolapse    Multiple thyroid nodules 11/24/2015   Neoplasm of lung, malignant ([place]) 2002   Non-small cell surgical tx   Osteoarthritis 11/14/2013   2010 intra-articular steroids X 3 S/P Physical Therapy in 12/14 TKR 12/11/13    Osteopenia    Dr Pamala Hurry   PONV (postoperative nausea and vomiting)    n/v   Sleep apnea    Vitamin D deficiency    Past Surgical History:  Procedure Laterality Date   ABDOMINAL HYSTERECTOMY  1998   No BSO, for dysfunctional menses   BLADDER SUSPENSION Bilateral    BREAST BIOPSY Right    COLONOSCOPY  2009 , 2014   Diverticulosis; Dr. Olevia Perches   LOBECTOMY  12/02   RU; no radiation or chemo, Dr. Arlyce Dice   TONSILLECTOMY     TOTAL KNEE ARTHROPLASTY Right 12/11/2013   Procedure: RIGHT TOTAL KNEE ARTHROPLASTY;  Surgeon: Gearlean Alf, MD;  Location: [facility];  Service: Orthopedics;  Laterality: Right;   TOTAL KNEE ARTHROPLASTY Left 06/28/2017   Procedure: LEFT TOTAL KNEE ARTHROPLASTY;  Surgeon: Gaynelle Arabian, MD;  Location: [facility];  Service: Orthopedics;  Laterality:  Left;  Adductor Block   Patient Active Problem List   Diagnosis Date Noted   Malignant neoplasm of right ovary ([place]) 09/18/2021   Coronary atherosclerosis due to calcified coronary lesion 09/17/2021   Degenerative cervical spinal stenosis 02/03/2021   Routine general medical examination at a health care facility 02/03/2021   Weakness of right hand 12/31/2020   Arthritis of midfoot 06/10/2020   HTN (hypertension) 11/08/2019   Hemochromatosis 09/06/2019   Preventative health care 06/16/2016   OSA (obstructive sleep apnea) 06/15/2016   Depression 05/14/2016   Multiple thyroid nodules 11/24/2015   Degenerative arthritis of lumbar spine 09/03/2014   OA (osteoarthritis) of knee 12/11/2013   Osteoarthritis 11/14/2013   BENIGN POSITIONAL VERTIGO 01/17/2010   Urinary incontinence 04/26/2009   Hyperlipidemia, mixed 02/04/2009   Vitamin D deficiency 10/23/2008   Osteoporosis 10/23/2008   NEOPLASM, MALIGNANT, LUNG, NON-SMALL CELL 11/21/2007   DIVERTICULOSIS 06/15/2007   GOITER, NODULAR 04/14/2007    REFERRING DIAG: M47.896 (ICD-10-CM) - Other osteoarthritis of spine, lumbar region M19.079 (ICD-10-CM) - Arthritis of midfoot   THERAPY DIAG:  Chronic midline low back pain without sciatica  Abnormal posture  Pain in right ankle and joints of right  foot  PERTINENT HISTORY: total knee bilaterally, ovarian cancer with chemo, OA, osteopenia   PRECAUTIONS: cancer  SUBJECTIVE:  Sore today, 2/10. I had a good workout the other day. No back pain right now.   PAIN:   Are you having pain? Yes NPRS scale: 3/10 Pain location: ankle Pain orientation: Right  PAIN TYPE: sore Pain description: intermittent  Aggravating factors: walking  Relieving factors: rest   OBJECTIVE:    DIAGNOSTIC FINDINGS:  Xray R ankle 10/09/2021 IMPRESSION: 1. Prominent right ankle osteoarthritis.  No acute bony abnormality.     Xray lumbar 10/09/2021 IMPRESSION: 1. Stable multilevel lumbar spondylosis,  facet hypertrophy, and right convex scoliosis. 2. No acute bony abnormality. 3. Suspected peritoneal calcification as above, compatible with the presumed ovarian cancer and peritoneal carcinomatosis seen on prior CT.   PATIENT SURVEYS:  FOTO done 12/10/21 47% able , goal is 58%   COGNITION:          Overall cognitive status: Within functional limits for tasks assessed      Increased time for processing HEP instructions today 12/10/21                   SENSATION:          Light touch: Appears intact          Stereognosis: Appears intact          Hot/Cold: Appears intact          Proprioception: Limited in bilateral fet, secondary to chemo.   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     Total Score: 45/56   Indicates need for cane or AD out of the house    LUMBARAROM/PROM   A/PROM A/PROM  12/09/2021  Flexion 25% min pain   Extension 75% limited tight but no pain   Right lateral flexion NT  Left lateral flexion NT  Right rotation [facility] tight   Left rotation WFL tight   (Blank rows = not tested)   LE AROM/PROM:   A/PROM Right 12/09/2021 Left 12/09/2021  Hip flexion      Hip extension      Hip abduction      Hip adduction      Hip internal rotation      Hip external rotation      Knee flexion      Knee extension      Ankle dorsiflexion 2   0  Ankle plantarflexion 60   60  Ankle inversion  40  45  Ankle eversion  18  32   (Blank rows = not tested)   LE MMT:   MMT Right 12/09/2021 Left 12/09/2021  Hip flexion 4/5 4+/5  Hip extension      Hip abduction      Hip adduction      Hip internal rotation      Hip external rotation      Knee flexion      Knee  extension      Ankle dorsiflexion 5 5  Ankle plantarflexion 4 NT  Ankle inversion 4/5 pain  5  Ankle eversion 4-/5 pain  4   (Blank rows = not tested)   LUMBAR SPECIAL  TESTS:  NT   FUNCTIONAL TESTS:  5 times sit to stand: 12/16/21: 23.5 sec (without UE) Timed up and go (TUG): 12/16/21: 16.3 sec Berg Balance Scale:12/10/21: 45/56   GAIT: Distance walked: 150 Assistive device utilized: None Level of assistance: Modified independence Comments: trunk flexed      [facility] Adult PT Treatment:                                                DATE: 01/02/22 Therapeutic Exercise: Nustep L5 UE and LE for 6 min  Standing slantboard stretch 1 min  Standing march with one foot on foam pad, x 10 each, cues for trunk extension Reverse slider lunges x 10  Step up lateral 4 inch x 15 , 1 UE assist Increased time for rest breaks Supine ankle theraband red with PT assisting x 15: combo PF/inversion and then DF/eversion, then isolated DF x 20  Resisted hip flex/ankle Df with looped red band    [facility] Adult PT Treatment:                                                DATE: 12/30/21 Therapeutic Exercise: Pilates Reformer used for LE/core strength, postural strength, lumbopelvic disassociation and core control.  Exercises included: Footwork 2 red 1 blue parallel, heels, arch then toes Used ball for alignment Prancing for stretching heel/tendon 2 red single leg x 10   Bridging all springs with ball x 10   Feet in Straps 1 red 1 yellow  Arcs (range is small due to hamstring tightness and post pelvic tilt) Squats x 15 with PT assist for control Increased time for transitions to avoid dizziness  [facility] Adult PT Treatment:                                                DATE: 12/26/21 Therapeutic Exercise: NuStep L 6 UE and LE for 6 min  Narrow stance EC Narrow stance on airex, head nods and turns  [employer] board stretch x 60 sec Ankle inversion isometric press x 15 Ankle red band  inversion and eversion x 20 each - PTA assist  Supine hamstring stretch/calf  3 x 30 sec  SLR x 10 each LE  Bridge x 15 hamstring bias with initial PPT  Hooklying clam green  10 x 2  Sit to stand 8 lbs x 10      PATIENT EDUCATION:  Education details: continue HEP Person educated: Patient Education method: Explanation Education comprehension: verbalized understanding     HOME EXERCISE PROGRAM:  Access Code: [email or web address] URL: [email or web address] Date: 12/26/2021 Prepared by: Hessie Diener  Exercises Seated Ankle Inversion with Resistance and Legs Crossed - 2 x daily - 7 x weekly - 2 sets - 10 reps - 5 hold Seated Ankle Eversion with Resistance - 2 x daily - 7 x weekly - 2 sets - 10 reps - 5 hold Standing Gastroc Stretch - 2 x daily - 7 x weekly - 1 sets - 5 reps - 30 hold Seated Hamstring Stretch - 2 x daily - 7 x weekly -  1 sets - 5 reps - 30 hold Hooklying Clamshell with Resistance - 2 x daily - 7 x weekly - 2 sets - 10 reps - 5 hold    ASSESSMENT:   CLINICAL IMPRESSION:  Pt challenged in standing for balance and hip exercises.  She has difficulty maintaining trunk extension with LLE> Rt LE for marching in SLS.  Pain present but does not limit her with what she can do in session.  Cont POC.    REHAB POTENTIAL: Good   CLINICAL DECISION MAKING: Stable/uncomplicated   EVALUATION COMPLEXITY: Low     GOALS: Goals reviewed with patient? No   SHORT TERM GOALS:   STG Name Target Date Goal status  1 Pt will be independent with HEP for ankle, foot and trunk Baseline:  12/24/2021 met  2 Pt will be able to report less pain in ankle when walking in her home, < 5/10 Baseline:  01/07/2022 INITIAL  3 Pt will be able to safely walk her dog short distances with walker and improved  confidence in her ankle, balance.  Baseline: 01/07/2022 INITIAL  4 Pt will report back pain reduced following HEP and PT intervention Baseline: 01/07/2022 INITIAL  5 Pt will be screened for  balance and goal set  12/24/21 met  LONG TERM GOALS:    LTG Name Target Date Goal status  1 Pt will improve FOTO score based on initial result  Baseline: 02/04/2022 INITIAL  2 Pt will be able to complete basic home tasks , light housework with improved levels of back pain most of the time (< 5/10)  Baseline: 02/04/2022 INITIAL  3 Pt will be able to increase hip abduction strength to 4+/5 or better bilaterally in order to show improved gait stability  Baseline: 02/04/2022 INITIAL  4 Pt will be able to retunr to the [place] [facility] for lifelong [facility] and health with pain managed Baseline: 02/04/2022 INITIAL  5 Pt will be I with HEP for balance and strength upon discharge  Baseline: 02/04/2022 INITIAL  6 Balance score will improve to 51/56 to demo reduced fall risk.   Baseline: 02/04/2022 INITIAL  PLAN: PT FREQUENCY: 2x/week   PT DURATION: 8 weeks   PLANNED INTERVENTIONS: Therapeutic exercises, Therapeutic activity, Neuro Muscular re-education, Balance training, Gait training, Patient/Family education, Joint mobilization, Aquatic Therapy, Dry Needling, Moist heat, and Manual therapy   PLAN FOR NEXT SESSION:  Pilates? Cont standing postural strength, strengthen back extensors (supported Prone) FOTO   Raeford Razor, PT 01/02/22 9:38 AM Phone: [phone] Fax: [phone]

## 2022-01-02 ENCOUNTER — Other Ambulatory Visit: Payer: Self-pay

## 2022-01-02 ENCOUNTER — Encounter: Payer: Self-pay | Admitting: Physical Therapy

## 2022-01-02 ENCOUNTER — Ambulatory Visit: Payer: Medicare Other | Attending: Family Medicine | Admitting: Physical Therapy

## 2022-01-02 DIAGNOSIS — M25571 Pain in right ankle and joints of right foot: Secondary | ICD-10-CM

## 2022-01-02 DIAGNOSIS — M545 Low back pain, unspecified: Secondary | ICD-10-CM

## 2022-01-02 DIAGNOSIS — G8929 Other chronic pain: Secondary | ICD-10-CM | POA: Diagnosis present

## 2022-01-02 DIAGNOSIS — R293 Abnormal posture: Secondary | ICD-10-CM

## 2022-01-19 ENCOUNTER — Ambulatory Visit: Payer: Medicare Other | Admitting: Physical Therapy

## 2022-01-19 ENCOUNTER — Other Ambulatory Visit: Payer: Self-pay

## 2022-01-19 ENCOUNTER — Encounter: Payer: Self-pay | Admitting: Physical Therapy

## 2022-01-19 DIAGNOSIS — M545 Low back pain, unspecified: Secondary | ICD-10-CM | POA: Diagnosis not present

## 2022-01-19 DIAGNOSIS — R293 Abnormal posture: Secondary | ICD-10-CM

## 2022-01-19 DIAGNOSIS — M25571 Pain in right ankle and joints of right foot: Secondary | ICD-10-CM

## 2022-01-19 NOTE — Therapy (Signed)
? ?OUTPATIENT PHYSICAL THERAPY TREATMENT NOTE ? ? ?Patient Name: Maria [name] ?MRN: [id number] ?DOB:11/12/1935, 86 y.o., female ?Today's Date: 01/19/2022 ? ?PCP: Janith Lima, MD ?REFERRING PROVIDER: Janith Lima, MD ? ? PT End of Session - 01/19/22 2229   ? ? Visit Number 8   ? Number of Visits 16   ? Date for PT Re-Evaluation 02/03/22   ? Authorization Type UHC MCR   ? PT Start Time [phone]   ? PT Stop Time 0930   ? PT Time Calculation (min) 40 min   ? ?  ?  ? ?  ? ? ? ? ? ? ?Past Medical History:  ?Diagnosis Date  ? Cataract   ? Complication of anesthesia   ? slow to awaken  ? Depression   ? Diverticulosis   ? Goiter, nodular   ? [place] of thyroid stable 7/08  ? Headache   ? hx of  ? Heart murmur   ? due to rheumatic fever as child  ? Hemochromatosis, hereditary ([place]) 09/01/2019  ? Mitral valve prolapse   ? Multiple thyroid nodules 11/24/2015  ? Neoplasm of lung, malignant ([place]) 2002  ? Non-small cell surgical tx  ? Osteoarthritis 11/14/2013  ? 2010 intra-articular steroids X 3 S/P Physical Therapy in 12/14 TKR 12/11/13   ? Osteopenia   ? Dr Pamala Hurry  ? PONV (postoperative nausea and vomiting)   ? n/v  ? Sleep apnea   ? Vitamin D deficiency   ? ?Past Surgical History:  ?Procedure Laterality Date  ? ABDOMINAL HYSTERECTOMY  1998  ? No BSO, for dysfunctional menses  ? BLADDER SUSPENSION Bilateral   ? BREAST BIOPSY Right   ? COLONOSCOPY  2009 , 2014  ? Diverticulosis; Dr. Olevia Perches  ? LOBECTOMY  12/02  ? RU; no radiation or chemo, Dr. Arlyce Dice  ? TONSILLECTOMY    ? TOTAL KNEE ARTHROPLASTY Right 12/11/2013  ? Procedure: RIGHT TOTAL KNEE ARTHROPLASTY;  Surgeon: Gearlean Alf, MD;  Location: [facility];  Service: Orthopedics;  Laterality: Right;  ? TOTAL KNEE ARTHROPLASTY Left 06/28/2017  ? Procedure: LEFT TOTAL KNEE ARTHROPLASTY;  Surgeon: Gaynelle Arabian, MD;  Location: [facility];  Service: Orthopedics;  Laterality: Left;  Adductor Block  ? ?Patient Active Problem List  ? Diagnosis Date Noted  ? Malignant neoplasm of right ovary  ([place]) 09/18/2021  ? Coronary atherosclerosis due to calcified coronary lesion 09/17/2021  ? Degenerative cervical spinal stenosis 02/03/2021  ? Routine general medical examination at a health care facility 02/03/2021  ? Weakness of right hand 12/31/2020  ? Arthritis of midfoot 06/10/2020  ? HTN (hypertension) 11/08/2019  ? Hemochromatosis 09/06/2019  ? Preventative health care 06/16/2016  ? OSA (obstructive sleep apnea) 06/15/2016  ? Depression 05/14/2016  ? Multiple thyroid nodules 11/24/2015  ? Degenerative arthritis of lumbar spine 09/03/2014  ? OA (osteoarthritis) of knee 12/11/2013  ? Osteoarthritis 11/14/2013  ? BENIGN POSITIONAL VERTIGO 01/17/2010  ? Urinary incontinence 04/26/2009  ? Hyperlipidemia, mixed 02/04/2009  ? Vitamin D deficiency 10/23/2008  ? Osteoporosis 10/23/2008  ? NEOPLASM, MALIGNANT, LUNG, NON-SMALL CELL 11/21/2007  ? DIVERTICULOSIS 06/15/2007  ? GOITER, NODULAR 04/14/2007  ? ? ?REFERRING DIAG: M47.896 (ICD-10-CM) - Other osteoarthritis of spine, lumbar region M19.079 (ICD-10-CM) - Arthritis of midfoot  ? ?THERAPY DIAG:  ?Chronic midline low back pain without sciatica ? ?Abnormal posture ? ?Pain in right ankle and joints of right foot ? ?PERTINENT HISTORY: total knee bilaterally, ovarian cancer with chemo, OA, osteopenia  ? ?PRECAUTIONS: cancer ? ?  SUBJECTIVE:  ?I have had a stressful couple of weeks. No pain .  She had a scan and got a good report.  Her best friend passed.   ? ?PAIN:  ? ?Are you having pain? No  ?NPRS scale: 0/10 ?Pain location: ankle ?Pain orientation: Right  ?PAIN TYPE: sore ?Pain description: intermittent  ?Aggravating factors: walking  ?Relieving factors: rest  ? ?OBJECTIVE:  ?  ?DIAGNOSTIC FINDINGS:  ?Xray R ankle 10/09/2021 ?IMPRESSION: ?1. Prominent right ankle osteoarthritis.  No acute bony abnormality. ?  ?  ?Xray lumbar 10/09/2021 ?IMPRESSION: ?1. Stable multilevel lumbar spondylosis, facet hypertrophy, and ?right convex scoliosis. ?2. No acute bony abnormality. ?3.  Suspected peritoneal calcification as above, compatible with the ?presumed ovarian cancer and peritoneal carcinomatosis seen on prior ?CT. ?  ?PATIENT SURVEYS:  ?FOTO done 12/10/21 47% able , goal is 58% ?  ?COGNITION: ?         Overall cognitive status: Within functional limits for tasks assessed      ?Increased time for processing HEP instructions today 12/10/21         ?          ?SENSATION: ?         Light touch: Appears intact ?         Stereognosis: Appears intact ?         Hot/Cold: Appears intact ?         Proprioception: Limited in bilateral fet, secondary to chemo.   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  ?MUSCLE LENGTH: ?Hamstrings: tight, 30-35 deg SLR  ?Thomas test:  ?  ?POSTURE:  ?Flexed posture, knees flexed, posterior pelvic tilt ?Shoulders in extension to counterbalance trunk in standing  ?  ?PALPATION: ?Pain along Rt dorsum of foot and laterally.  ?  ? ?Total Score: 45/56  ? Indicates need for cane or AD out of the house  ? ? ?LUMBARAROM/PROM ?  ?A/PROM A/PROM  ?12/09/2021  ?Flexion 25% min pain   ?Extension 75% limited tight but no pain   ?Right lateral flexion NT  ?Left lateral flexion NT  ?Right rotation WFL tight   ?Left rotation WFL tight  ? (Blank rows = not tested) ?  ?LE AROM/PROM: ?  ?A/PROM Right ?12/09/2021 Left ?12/09/2021  ?Hip flexion      ?Hip extension      ?Hip abduction      ?Hip adduction      ?Hip internal rotation      ?Hip external rotation      ?Knee flexion      ?Knee extension      ?Ankle dorsiflexion 2   0  ?Ankle plantarflexion 60   60  ?Ankle inversion  40  45  ?Ankle eversion  18  32  ? (Blank rows = not tested) ?  ?LE MMT: ?  ?MMT Right ?12/09/2021 Left ?12/09/2021  ?Hip flexion 4/5 4+/5  ?Hip extension      ?Hip abduction      ?Hip adduction      ?Hip internal rotation      ?Hip external rotation      ?Knee flexion      ?Knee extension      ?Ankle dorsiflexion 5 5  ?Ankle plantarflexion 4 NT  ?Ankle inversion  4/5 pain  5  ?Ankle eversion 4-/5 pain  4  ? (Blank rows = not tested) ?  ?LUMBAR SPECIAL TESTS:  ?NT ?  ?FUNCTIONAL  TESTS:  ?5 times sit to stand: 12/16/21: 23.5 sec (without UE) ?Timed up and go (TUG): 12/16/21: 16.3 sec ?Berg Balance Scale:12/10/21: 45/56 ?  ?GAIT: ?Distance walked: 150 ?Assistive device utilized: None ?Level of assistance: Modified independence ?Comments: trunk flexed  ? ?[facility] Adult PT Treatment:                                                DATE: 01/19/22 ?Therapeutic Exercise: ?NuStep L 6 UE and LE 6 min  ?Seated LAQ , 4 lbs x 15 each  ?Sit to stand 4 lbs on ankles x 10 no UE  ?Standing heel raise ?Springboard roll down bar  x 10  ?Bilateral extension hold with march ,min A  ?Facing forward shoulder flexion with slastix x 10 added march but unable  ?Prone hip extension 3-/5 bilateral done x 10 each  ?Bridge with 5 sec hold x 10  ?Self Care: ?POC, FOTO and muscle groups to target posture in standing, walking   ? ? ?[facility] Adult PT Treatment:                                                DATE: 01/02/22 ?Therapeutic Exercise: ?Nustep L5 UE and LE for 6 min  ?Standing slantboard stretch 1 min  ?Standing march with one foot on foam pad, x 10 each, cues for trunk extension ?Reverse slider lunges x 10  ?Step up lateral 4 inch x 15 , 1 UE assist ?Increased time for rest breaks ?Supine ankle theraband red with PT assisting x 15: combo PF/inversion and then DF/eversion, then isolated DF x 20  ?Resisted hip flex/ankle Df with looped red band ? ?  [facility] Adult PT Treatment:                                                DATE: 12/30/21 ?Therapeutic Exercise: ?Pilates Reformer used for LE/core strength, postural strength, lumbopelvic disassociation and core control.  Exercises included: ?Footwork ?2 red 1 blue parallel, heels, arch then toes ?Used ball for alignment ?Prancing for stretching heel/tendon ?2 red single leg x 10  ? ?Bridging all springs with ball x 10  ? ?Feet in Straps ?1 red 1 yellow  ?Arcs (range is  small due to hamstring tightness and post pelvic tilt) ?Squats x 15 with PT assist for control ?Increased time for transitions to avoid dizziness ? ?[facility] Adult PT Treatment:

## 2022-01-27 NOTE — Therapy (Signed)
? ?OUTPATIENT PHYSICAL THERAPY TREATMENT NOTE ?RENEWAL ? ? ?Patient Name: Maria [name] ?MRN: [id number] ?DOB:October 25, 1936, 86 y.o., female ?Today's Date: 01/28/2022 ? ?PCP: Janith Lima, MD ?REFERRING PROVIDER: Lyndal Pulley, DO ? ? PT End of Session - 01/28/22 5284   ? ? Visit Number 9   ? Number of Visits 16   ? Date for PT Re-Evaluation 02/03/22   ? Authorization Type UHC MCR   ? PT Start Time [phone]   ? PT Stop Time 0930   ? PT Time Calculation (min) 40 min   ? ?  ?  ? ?  ? ? ? ? ? ? ? ?Past Medical History:  ?Diagnosis Date  ? Cataract   ? Complication of anesthesia   ? slow to awaken  ? Depression   ? Diverticulosis   ? Goiter, nodular   ? [place] of thyroid stable 7/08  ? Headache   ? hx of  ? Heart murmur   ? due to rheumatic fever as child  ? Hemochromatosis, hereditary ([place]) 09/01/2019  ? Mitral valve prolapse   ? Multiple thyroid nodules 11/24/2015  ? Neoplasm of lung, malignant ([place]) 2002  ? Non-small cell surgical tx  ? Osteoarthritis 11/14/2013  ? 2010 intra-articular steroids X 3 S/P Physical Therapy in 12/14 TKR 12/11/13   ? Osteopenia   ? Dr Pamala Hurry  ? PONV (postoperative nausea and vomiting)   ? n/v  ? Sleep apnea   ? Vitamin D deficiency   ? ?Past Surgical History:  ?Procedure Laterality Date  ? ABDOMINAL HYSTERECTOMY  1998  ? No BSO, for dysfunctional menses  ? BLADDER SUSPENSION Bilateral   ? BREAST BIOPSY Right   ? COLONOSCOPY  2009 , 2014  ? Diverticulosis; Dr. Olevia Perches  ? LOBECTOMY  12/02  ? RU; no radiation or chemo, Dr. Arlyce Dice  ? TONSILLECTOMY    ? TOTAL KNEE ARTHROPLASTY Right 12/11/2013  ? Procedure: RIGHT TOTAL KNEE ARTHROPLASTY;  Surgeon: Gearlean Alf, MD;  Location: [facility];  Service: Orthopedics;  Laterality: Right;  ? TOTAL KNEE ARTHROPLASTY Left 06/28/2017  ? Procedure: LEFT TOTAL KNEE ARTHROPLASTY;  Surgeon: Gaynelle Arabian, MD;  Location: [facility];  Service: Orthopedics;  Laterality: Left;  Adductor Block  ? ?Patient Active Problem List  ? Diagnosis Date Noted  ? Malignant neoplasm of  right ovary ([place]) 09/18/2021  ? Coronary atherosclerosis due to calcified coronary lesion 09/17/2021  ? Degenerative cervical spinal stenosis 02/03/2021  ? Routine general medical examination at a health care facility 02/03/2021  ? Weakness of right hand 12/31/2020  ? Arthritis of midfoot 06/10/2020  ? HTN (hypertension) 11/08/2019  ? Hemochromatosis 09/06/2019  ? Preventative health care 06/16/2016  ? OSA (obstructive sleep apnea) 06/15/2016  ? Depression 05/14/2016  ? Multiple thyroid nodules 11/24/2015  ? Degenerative arthritis of lumbar spine 09/03/2014  ? OA (osteoarthritis) of knee 12/11/2013  ? Osteoarthritis 11/14/2013  ? BENIGN POSITIONAL VERTIGO 01/17/2010  ? Urinary incontinence 04/26/2009  ? Hyperlipidemia, mixed 02/04/2009  ? Vitamin D deficiency 10/23/2008  ? Osteoporosis 10/23/2008  ? NEOPLASM, MALIGNANT, LUNG, NON-SMALL CELL 11/21/2007  ? DIVERTICULOSIS 06/15/2007  ? GOITER, NODULAR 04/14/2007  ? ? ?REFERRING DIAG: M47.896 (ICD-10-CM) - Other osteoarthritis of spine, lumbar region M19.079 (ICD-10-CM) - Arthritis of midfoot  ? ?THERAPY DIAG:  ?Chronic midline low back pain without sciatica ? ?Abnormal posture ? ?Pain in right ankle and joints of right foot ? ?PERTINENT HISTORY: total knee bilaterally, ovarian cancer with chemo, OA, osteopenia  ? ?PRECAUTIONS:  cancer ? ?SUBJECTIVE:  ?We have to work on that last exercise.  I am going to see if Dr. Tamala Julian can give me a small dose of Tramadol.   My back hurts at the end of the day, so badly.  I do my exercises 3-4 days per week. 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  ? ?PAIN:  ? ?Are you having pain? No  ?NPRS scale: 0/10 can be severe at night (8/10)  ?Pain location: back.  No pain in ankle.  ?Pain orientation: Right  ?PAIN TYPE: sore ?Pain description: intermittent  ?Aggravating factors: walking  ?Relieving factors: rest  ? ?OBJECTIVE:  ?  ?DIAGNOSTIC FINDINGS:  ?Xray R ankle 10/09/2021 ?IMPRESSION: ?1. Prominent  right ankle osteoarthritis.  No acute bony abnormality. ?  ?  ?Xray lumbar 10/09/2021 ?IMPRESSION: ?1. Stable multilevel lumbar spondylosis, facet hypertrophy, and ?right convex scoliosis. ?2. No acute bony abnormality. ?3. Suspected peritoneal calcification as above, compatible with the ?presumed ovarian cancer and peritoneal carcinomatosis seen on prior ?CT. ?  ?PATIENT SURVEYS:  ?FOTO done 12/10/21 47% able , 01/19/22 48% (goal is 58%) ?  ?COGNITION: ?         Overall cognitive status: Within functional limits for tasks assessed      ?Increased time for processing HEP instructions today 12/10/21         ?          ?SENSATION: ?         Light touch: Appears intact ?         Stereognosis: Appears intact ?         Hot/Cold: Appears intact ?         Proprioception: Limited in bilateral fet, secondary to chemo.   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  ?MUSCLE LENGTH: ?Hamstrings: tight, 30-35 deg SLR  ?Thomas test:  ?  ?POSTURE:  ?Flexed posture, knees flexed, posterior pelvic tilt ?Shoulders in extension to counterbalance trunk in standing  ?  ?PALPATION: ?Pain along Rt dorsum of foot and laterally.  ?  ? ?Total Score: 45/56  ? Indicates need for cane or AD out of the house  ? ? ?LUMBARAROM/PROM ?  ?A/PROM A/PROM  ?12/09/2021 AROM ?01/28/22  ?Flexion 25% min pain  Mid shin no pain   ?Extension 75% limited tight but no pain  50%  "feels good"  ?Right lateral flexion NT NT  ?Left lateral flexion NT NT  ?Right rotation WFL tight  WFL  ?Left rotation WFL tight WFL   ? (Blank rows = not tested) ?  ?LE AROM/PROM: ?  ?A/PROM Right ?12/09/2021 Left ?12/09/2021  ?Hip flexion      ?Hip extension      ?Hip abduction      ?Hip adduction      ?Hip internal rotation      ?Hip external rotation      ?Knee flexion      ?Knee extension      ?Ankle dorsiflexion 2   0  ?Ankle plantarflexion 60   60  ?Ankle inversion  40  45  ?Ankle eversion  18  32  ? (Blank rows = not tested) ?   ?LE MMT: ?  ?MMT Right ?12/09/2021 Left ?12/09/2021 Rt ?01/28/22 Lt.  ?01/28/22  ?Hip flexion 4/5 4+/5 4 4+  ?Hip extension     3- 3-  ?Hip abduction  3- 3-  ?Hip adduction        ?Hip internal rotation        ?Hip external rotation        ?Knee flexion     4+ 4+  ?Knee extension     4+ 4+  ?Ankle dorsiflexion 5 5 5 5   ?Ankle plantarflexion 4 NT    ?Ankle inversion 4/5 pain  5    ?Ankle eversion 4-/5 pain  4    ? (Blank rows = not tested) ?  ?LUMBAR SPECIAL TESTS:  ?NT ?  ?FUNCTIONAL TESTS:  ?5 times sit to stand: 12/16/21: 23.5 sec (without UE) ?          01/28/22 : 15.7 sec  ?Timed up and go (TUG): 12/16/21: 16.3 sec ?Berg Balance Scale:12/10/21: 45/56 ?  ?GAIT: ?Distance walked: 150 ?Assistive device utilized: None ?Level of assistance: Modified independence ?Comments: trunk flexed  ? ? ? ? ?PATIENT EDUCATION:  ?Education details: hip weakness, standing posture, POC, goals  ?Person educated: Patient ?Education method: Explanation ?Education comprehension: verbalized understanding ? ? ? [facility] Adult PT Treatment:                                                DATE: 01/27/22 ?Therapeutic Exercise: ?NuStep L5 UE and LE for 6 min  ?Prone hip extension x 10 each  ?Seated clam green band x 15  ?Sit to stand with green band on thighs x 10  ?Standing hip abduction x 15 each with UE support ? ?Self Care: ?Renewal , POC, goals  and FOTO   ?  ?HOME EXERCISE PROGRAM: ? Access Code: [email or web address] ?URL: [email or web address] ?Date: 01/28/2022 ?Prepared by: Raeford Razor ? ?Exercises ?- Seated Ankle Inversion with Resistance and Legs Crossed  - 2 x daily - 7 x weekly - 2 sets - 10 reps - 5 hold ?- Seated Ankle Eversion with Resistance  - 2 x daily - 7 x weekly - 2 sets - 10 reps - 5 hold ?- Standing Gastroc Stretch  - 2 x daily - 7 x weekly - 1 sets - 5 reps - 30 hold ?- Seated Hamstring Stretch  - 2 x daily - 7 x weekly - 1 sets - 5 reps - 30 hold ?- Hooklying Clamshell with Resistance  - 5 x daily - 7 x weekly - 2 sets - 10  reps - 5 hold ?- Supine Bridge with Resistance Band  - 1 x daily - 7 x weekly - 2 sets - 10 reps - 5 hold ?- Standing Hip Abduction with Anterior Support  - 1 x daily - 7 x weekly - 2 sets - 10 reps - 5 hold ?

## 2022-01-28 ENCOUNTER — Encounter: Payer: Self-pay | Admitting: Physical Therapy

## 2022-01-28 ENCOUNTER — Other Ambulatory Visit: Payer: Self-pay

## 2022-01-28 ENCOUNTER — Ambulatory Visit: Payer: Medicare Other | Admitting: Physical Therapy

## 2022-01-28 DIAGNOSIS — R293 Abnormal posture: Secondary | ICD-10-CM

## 2022-01-28 DIAGNOSIS — M545 Low back pain, unspecified: Secondary | ICD-10-CM | POA: Diagnosis not present

## 2022-01-28 DIAGNOSIS — M25571 Pain in right ankle and joints of right foot: Secondary | ICD-10-CM

## 2022-01-28 DIAGNOSIS — G8929 Other chronic pain: Secondary | ICD-10-CM

## 2022-02-11 ENCOUNTER — Ambulatory Visit: Payer: Medicare Other | Admitting: Physical Therapy

## 2022-02-13 ENCOUNTER — Ambulatory Visit: Payer: Medicare Other | Attending: Family Medicine | Admitting: Physical Therapy

## 2022-02-13 ENCOUNTER — Encounter: Payer: Self-pay | Admitting: Physical Therapy

## 2022-02-13 DIAGNOSIS — M25571 Pain in right ankle and joints of right foot: Secondary | ICD-10-CM | POA: Diagnosis present

## 2022-02-13 DIAGNOSIS — M545 Low back pain, unspecified: Secondary | ICD-10-CM | POA: Insufficient documentation

## 2022-02-13 DIAGNOSIS — G8929 Other chronic pain: Secondary | ICD-10-CM | POA: Insufficient documentation

## 2022-02-13 DIAGNOSIS — R293 Abnormal posture: Secondary | ICD-10-CM | POA: Diagnosis present

## 2022-02-13 NOTE — Therapy (Signed)
? ?OUTPATIENT PHYSICAL THERAPY TREATMENT NOTE ?RENEWAL ? ? ?Patient Name: Maria [name] ?MRN: [id number] ?DOB:[birth year], 86 y.o., female ?Today's Date: 02/13/2022 ? ?PCP: Janith Lima, MD ?REFERRING PROVIDER: Lyndal Pulley, DO ? ? PT End of Session - 02/13/22 0850   ? ? Visit Number 10   ? Number of Visits 16   ? Date for PT Re-Evaluation 02/03/22   ? Authorization Type [employer] MCR   ? PT Start Time [phone]   ? PT Stop Time 0930   ? PT Time Calculation (min) 43 min   ? ?  ?  ? ?  ? ? ? ? ? ? ? ?Past Medical History:  ?Diagnosis Date  ? Cataract   ? Complication of anesthesia   ? slow to awaken  ? Depression   ? Diverticulosis   ? Goiter, nodular   ? [place] of thyroid stable 7/08  ? Headache   ? hx of  ? Heart murmur   ? due to rheumatic fever as child  ? Hemochromatosis, hereditary ([place]) 09/01/2019  ? Mitral valve prolapse   ? Multiple thyroid nodules 11/24/2015  ? Neoplasm of lung, malignant ([place]) 2002  ? Non-small cell surgical tx  ? Osteoarthritis 11/14/2013  ? 2010 intra-articular steroids X 3 S/P Physical Therapy in 12/14 TKR 12/11/13   ? Osteopenia   ? Dr Pamala Hurry  ? PONV (postoperative nausea and vomiting)   ? n/v  ? Sleep apnea   ? Vitamin D deficiency   ? ?Past Surgical History:  ?Procedure Laterality Date  ? ABDOMINAL HYSTERECTOMY  1998  ? No BSO, for dysfunctional menses  ? BLADDER SUSPENSION Bilateral   ? BREAST BIOPSY Right   ? COLONOSCOPY  2009 , 2014  ? Diverticulosis; Dr. Olevia Perches  ? LOBECTOMY  12/02  ? RU; no radiation or chemo, Dr. Arlyce Dice  ? TONSILLECTOMY    ? TOTAL KNEE ARTHROPLASTY Right 12/11/2013  ? Procedure: RIGHT TOTAL KNEE ARTHROPLASTY;  Surgeon: Gearlean Alf, MD;  Location: [facility];  Service: Orthopedics;  Laterality: Right;  ? TOTAL KNEE ARTHROPLASTY Left 06/28/2017  ? Procedure: LEFT TOTAL KNEE ARTHROPLASTY;  Surgeon: Gaynelle Arabian, MD;  Location: [facility];  Service: Orthopedics;  Laterality: Left;  Adductor Block  ? ?Patient Active Problem List  ? Diagnosis Date Noted  ? Malignant neoplasm of  right ovary ([place]) 09/18/2021  ? Coronary atherosclerosis due to calcified coronary lesion 09/17/2021  ? Degenerative cervical spinal stenosis 02/03/2021  ? Routine general medical examination at a health care facility 02/03/2021  ? Weakness of right hand 12/31/2020  ? Arthritis of midfoot 06/10/2020  ? HTN (hypertension) 11/08/2019  ? Hemochromatosis 09/06/2019  ? Preventative health care 06/16/2016  ? OSA (obstructive sleep apnea) 06/15/2016  ? Depression 05/14/2016  ? Multiple thyroid nodules 11/24/2015  ? Degenerative arthritis of lumbar spine 09/03/2014  ? OA (osteoarthritis) of knee 12/11/2013  ? Osteoarthritis 11/14/2013  ? BENIGN POSITIONAL VERTIGO 01/17/2010  ? Urinary incontinence 04/26/2009  ? Hyperlipidemia, mixed 02/04/2009  ? Vitamin D deficiency 10/23/2008  ? Osteoporosis 10/23/2008  ? NEOPLASM, MALIGNANT, LUNG, NON-SMALL CELL 11/21/2007  ? DIVERTICULOSIS 06/15/2007  ? GOITER, NODULAR 04/14/2007  ? ? ?REFERRING DIAG: M47.896 (ICD-10-CM) - Other osteoarthritis of spine, lumbar region M19.079 (ICD-10-CM) - Arthritis of midfoot  ? ?THERAPY DIAG:  ?Chronic midline low back pain without sciatica ? ?Abnormal posture ? ?Pain in right ankle and joints of right foot ? ?PERTINENT HISTORY: total knee bilaterally, ovarian cancer with chemo, OA, osteopenia  ? ?PRECAUTIONS:  cancer ? ?SUBJECTIVE:  ?I have a muscle in my back that started hurting 2 months ago. The only thing that I can do that is comfortable is yoga stretching. I think it is a muscle. We have to work on that last exercise.  I am going to see if Dr. Tamala Julian in May to see if he can give me a small dose of Tramadol.  Can only walk short distances due to back pain.  ? ?PAIN:  ? ?Are you having pain? No  ?NPRS scale: 3/10 can be severe at night (8/10)  ?Pain location: back.  No pain in ankle.  ?Pain orientation: Right  ?PAIN TYPE: sore ?Pain description: intermittent  ?Aggravating factors: walking  ?Relieving factors: rest  ? ?OBJECTIVE:  ?  ?DIAGNOSTIC  FINDINGS:  ?Xray R ankle 10/09/2021 ?IMPRESSION: ?1. Prominent right ankle osteoarthritis.  No acute bony abnormality. ?  ?  ?Xray lumbar 10/09/2021 ?IMPRESSION: ?1. Stable multilevel lumbar spondylosis, facet hypertrophy, and ?right convex scoliosis. ?2. No acute bony abnormality. ?3. Suspected peritoneal calcification as above, compatible with the ?presumed ovarian cancer and peritoneal carcinomatosis seen on prior ?CT. ?  ?PATIENT SURVEYS:  ?FOTO done 12/10/21 47% able , 01/19/22 48% (goal is 58%) ?  ?COGNITION: ?         Overall cognitive status: Within functional limits for tasks assessed      ?Increased time for processing HEP instructions today 12/10/21         ?          ?SENSATION: ?         Light touch: Appears intact ?         Stereognosis: Appears intact ?         Hot/Cold: Appears intact ?         Proprioception: Limited in bilateral fet, secondary to chemo.   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  ?MUSCLE LENGTH: ?Hamstrings: tight, 30-35 deg SLR  ?Thomas test:  ?  ?POSTURE:  ?Flexed posture, knees flexed, posterior pelvic tilt ?Shoulders in extension to counterbalance trunk in standing  ?  ?PALPATION: ?Pain along Rt dorsum of foot and laterally.  ?  ? ?Total Score: 45/56  ? Indicates need for cane or AD out of the house  ? ? ?LUMBARAROM/PROM ?  ?A/PROM A/PROM  ?12/09/2021 AROM ?01/28/22  ?Flexion 25% min pain  Mid shin no pain   ?Extension 75% limited tight but no pain  50%  "feels good"  ?Right lateral flexion NT NT  ?Left lateral flexion NT NT  ?Right rotation WFL tight  WFL  ?Left rotation WFL tight WFL   ? (Blank rows = not tested) ?  ?LE AROM/PROM: ?  ?A/PROM Right ?12/09/2021 Left ?12/09/2021  ?Hip flexion      ?Hip extension      ?Hip abduction      ?Hip adduction      ?Hip internal rotation      ?Hip external rotation      ?Knee flexion      ?Knee extension      ?Ankle dorsiflexion 2   0  ?Ankle plantarflexion 60   60  ?Ankle inversion   40  45  ?Ankle eversion  18  32  ? (Blank rows = not tested) ?  ?LE MMT: ?  ?MMT Right ?12/09/2021 Left ?12/09/2021 Rt ?01/28/22 Lt.  ?01/28/22  ?Hip flexion 4/5 4+/5 4 4+  ?Hip extension  3- 3-  ?Hip abduction     3- 3-  ?Hip adduction        ?Hip internal rotation        ?Hip external rotation        ?Knee flexion     4+ 4+  ?Knee extension     4+ 4+  ?Ankle dorsiflexion 5 5 5 5   ?Ankle plantarflexion 4 NT    ?Ankle inversion 4/5 pain  5    ?Ankle eversion 4-/5 pain  4    ? (Blank rows = not tested) ?  ?LUMBAR SPECIAL TESTS:  ?NT ?  ?FUNCTIONAL TESTS:  ?5 times sit to stand: 12/16/21: 23.5 sec (without UE) ?          01/28/22 : 15.7 sec  ?Timed up and go (TUG): 12/16/21: 16.3 sec ?Berg Balance Scale:12/10/21: 45/56 ?  ?GAIT: ?Distance walked: 150 ?Assistive device utilized: None ?Level of assistance: Modified independence ?Comments: trunk flexed  ? ? ? ? ?PATIENT EDUCATION:  ?Education details: hip weakness, standing posture, POC, goals  ?Person educated: Patient ?Education method: Explanation ?Education comprehension: verbalized understanding ? ? ? [facility] Adult PT Treatment:                                                DATE: 02/13/22 ?Therapeutic Exercise: ?NuStep L5 UE and LE for 5 min  ?Prone hamstring curls x 10 each ?Prone hip extension x 10 each  ?Prone knee flexion with hip extension x 4 each ?Sidelying- clam yellow 10 x 2  ?Sidelying -reverse clam 10 x 2 AROM ?Bridge -yellow banded x 10 ? ? ? ?[facility] Adult PT Treatment:                                                DATE: 01/27/22 ?Therapeutic Exercise: ?NuStep L5 UE and LE for 6 min  ?Prone hip extension x 10 each  ?Seated clam green band x 15  ?Sit to stand with green band on thighs x 10  ?Standing hip abduction x 15 each with UE support ? ?Self Care: ?Renewal , POC, goals  and FOTO   ?  ?HOME EXERCISE PROGRAM: ? Access Code: [email or web address] ?URL: [email or web address] ?Date: 01/28/2022 ?Prepared by: Raeford Razor ? ?Exercises ?- Seated Ankle Inversion with  Resistance and Legs Crossed  - 2 x daily - 7 x weekly - 2 sets - 10 reps - 5 hold ?- Seated Ankle Eversion with Resistance  - 2 x daily - 7 x weekly - 2 sets - 10 reps - 5 hold ?- Standing Gastroc Stretch  -

## 2022-02-17 NOTE — Progress Notes (Signed)
?Maria [name] D.O. ?Maria [name] ?Maria [name] ?Phone: [phone] ?Subjective:   ?I, Maria [name], am serving as a scribe for Dr. Hulan [name]. ? ?This visit occurred during the SARS-CoV-2 public health emergency.  Safety protocols were in place, including screening questions prior to the visit, additional usage of staff PPE, and extensive cleaning of exam room while observing appropriate contact time as indicated for disinfecting solutions.  ? ?I'm seeing this patient by the request  of:  Maria Lima, MD ? ?CC: back pain follow up  ? ?TOI:[id number]  ?11/20/2021 ?Discussed again with the patient.  We discussed the possibility of advanced imaging the patient has not had any fevers, chills, any abnormal weight loss.  Patient feels like if she can start becoming more active she would do well and will start with formal physical therapy.  Held on anything else at this moment such as medications.  Patient will have the physical therapy and we will see how patient responds and follow-up with me again in 6 weeks total time reviewing outside notes, patient's oncology notes, and imaging greater than 31 minutes. ? ?Arthritis of the midfoot as well as the ankle.  Likely contributing to some of the abdomen antalgic gait.  Patient's hemochromatosis as well as chemotherapy likely contributed as well.  Would like patient to start with formal physical therapy.  Do think that this will be significantly helpful.  Patient is encouraged by this and will do it also for her back.  We discussed at this point about different medications.  Patient wants to hold on anything significantly other than potentially continuing her Cymbalta.  May need to consider possibly even increasing her dose.  Patient will otherwise stay active where she can and will follow-up with me again in 6 to 8 weeks ? ?Updated 02/18/2022 ?Maria [name] is a 86 y.o. female coming in with complaint of back and foot pain wants  to start with formal physical therapy.  Patient was to continue her Cymbalta as well. States that she has been having pain R side pain but today her pain has been subsided. Patient has been using Tramdol with Tylenol for past 4 days when a friend gave her some and would like a prescription if possible.  ? ?Pain is intermittent in feet. Continues to do physical therapy.  ? ?Reviewing patient's outside imaging and notes from oncologist CT scan in March of this year showed a new hepatic dome lesion of 1.5 cm and an anterior peritoneal nodule growth.  Ca1 25 remains low. ? ?  ? ?Past Medical History:  ?Diagnosis Date  ? Cataract   ? Complication of anesthesia   ? slow to awaken  ? Depression   ? Diverticulosis   ? Goiter, nodular   ? [place] of thyroid stable 7/08  ? Headache   ? hx of  ? Heart murmur   ? due to rheumatic fever as child  ? Hemochromatosis, hereditary ([place]) 09/01/2019  ? Mitral valve prolapse   ? Multiple thyroid nodules 11/24/2015  ? Neoplasm of lung, malignant ([place]) 2002  ? Non-small cell surgical tx  ? Osteoarthritis 11/14/2013  ? 2010 intra-articular steroids X 3 S/P Physical Therapy in 12/14 TKR 12/11/13   ? Osteopenia   ? Dr Pamala Hurry  ? PONV (postoperative nausea and vomiting)   ? n/v  ? Sleep apnea   ? Vitamin D deficiency   ? ?Past Surgical History:  ?Procedure Laterality Date  ? ABDOMINAL HYSTERECTOMY  1998  ? No BSO, for dysfunctional menses  ? BLADDER SUSPENSION Bilateral   ? BREAST BIOPSY Right   ? COLONOSCOPY  2009 , 2014  ? Diverticulosis; Dr. Olevia Perches  ? LOBECTOMY  12/02  ? RU; no radiation or chemo, Dr. Arlyce Dice  ? TONSILLECTOMY    ? TOTAL KNEE ARTHROPLASTY Right 12/11/2013  ? Procedure: RIGHT TOTAL KNEE ARTHROPLASTY;  Surgeon: Gearlean Alf, MD;  Location: [facility];  Service: Orthopedics;  Laterality: Right;  ? TOTAL KNEE ARTHROPLASTY Left 06/28/2017  ? Procedure: LEFT TOTAL KNEE ARTHROPLASTY;  Surgeon: Gaynelle Arabian, MD;  Location: [facility];  Service: Orthopedics;  Laterality: Left;  Adductor Block   ? ?Social History  ? ?Socioeconomic History  ? Marital status: Divorced  ?  Spouse name: Not on file  ? Number of children: Not on file  ? Years of education: college  ? Highest education level: Not on file  ?Occupational History  ? Occupation: Retired  ?  Employer: RETIRED  ?Tobacco Use  ? Smoking status: Former  ?  Packs/day: 0.25  ?  Years: 20.00  ?  Pack years: 5.00  ?  Types: Cigarettes  ?  Quit date: 05/03/1979  ?  Years since quitting: 42.8  ? Smokeless tobacco: Never  ? Tobacco comments:  ?  smoked 1962-1980, up to < 1 ppd  ?Vaping Use  ? Vaping Use: Never used  ?Substance and Sexual Activity  ? Alcohol use: Yes  ?  Alcohol/week: 7.0 standard drinks  ?  Types: 7 Standard drinks or equivalent per week  ?  Comment: occasional  ? Drug use: No  ? Sexual activity: Not Currently  ?  Comment: lives alone, dog, no dietary restrictions, eating heart healthy  ?Other Topics Concern  ? Not on file  ?Social History Narrative  ? Pt gets regular exercise.  ? Drinks 2 cups of coffee a day   ? ?Social Determinants of Health  ? ?Financial Resource Strain: Not on file  ?Food Insecurity: Not on file  ?Transportation Needs: Not on file  ?Physical Activity: Not on file  ?Stress: Not on file  ?Social Connections: Not on file  ? ?No Known Allergies ?Family History  ?Problem Relation Age of Onset  ? COPD Mother   ? Coronary artery disease Father   ? Colon cancer Father 67  ? Heart attack Father 65  ? Stroke Father   ?     > 60  ? Lung cancer Paternal Aunt   ?     smoker  ? Cancer Paternal Aunt   ?     lung  ? Lung cancer Maternal Grandfather   ?     smoker  ? COPD Maternal Grandfather   ? Arthritis Sister   ? Lung cancer Maternal Uncle   ?     smoker  ? Diabetes Neg Hx   ? ? ? ?Current Outpatient Medications (Cardiovascular):  ?  atorvastatin (LIPITOR) 20 MG tablet, TAKE 1 TABLET BY MOUTH EVERYDAY AT BEDTIME ? ? ?Current Outpatient Medications (Analgesics):  ?  acetaminophen (TYLENOL) 500 MG tablet, Take 500 mg by mouth every 6 (six)  hours as needed. ?  aspirin EC 81 MG tablet, Take 1 tablet (81 mg total) by mouth daily. Swallow whole. ?  traMADol (ULTRAM) 50 MG tablet, Take 1 tablet (50 mg total) by mouth every 8 (eight) hours as needed for up to 5 days. ? ?Current Outpatient Medications (Hematological):  ?  cyanocobalamin 1000 MCG tablet, Take by mouth. ? ?  Current Outpatient Medications (Other):  ?  CALCIUM PO, Take 1 tablet by mouth 3 (three) times daily with meals. ?  Cholecalciferol (VITAMIN D3 PO), Take 1 tablet by mouth daily. ?  DULoxetine (CYMBALTA) 30 MG capsule, TAKE 1 CAPSULE BY MOUTH EVERY DAY ? ? ?Reviewed prior external information including notes and imaging from  ?primary care provider ?As well as notes that were available from care everywhere and other healthcare systems. ? ?Past medical history, social, surgical and family history all reviewed in electronic medical record.  No pertanent information unless stated regarding to the chief complaint.  ? ?Review of Systems: ? No headache, visual changes, nausea, vomiting, diarrhea, constipation, dizziness, abdominal pain, skin rash, fevers, chills, night sweats, weight loss, swollen lymph node joint swelling, chest pain, shortness of breath, mood changes. POSITIVE muscle aches, body aches ? ?Objective  ?Blood pressure 110/82, pulse 85, height 5\' 6"  (1.676 m), weight 155 lb (70.3 kg), SpO2 97 %. ?  ?General: No apparent distress alert and oriented x3 mood and affect normal, dressed appropriately.  ?HEENT: Pupils equal, extraocular movements intact  ?Respiratory: Patient's speak in full sentences and does not appear short of breath  ?Cardiovascular: No lower extremity edema, non tender, no erythema  ?Gait antalgic  ?MSK: Significant scoliosis of the back.  No difficulty going from a seated to standing position.  Patient does have difficulty with walking on a regular basis.  4-5 strength in lower extremities. ? ?  ?Impression and Recommendations:  ?  ? ?The above documentation has been  reviewed and is accurate and complete Lyndal Pulley, DO ? ? ? ?

## 2022-02-18 ENCOUNTER — Telehealth: Payer: Self-pay | Admitting: Internal Medicine

## 2022-02-18 ENCOUNTER — Ambulatory Visit: Payer: Medicare Other | Admitting: Family Medicine

## 2022-02-18 DIAGNOSIS — M47896 Other spondylosis, lumbar region: Secondary | ICD-10-CM

## 2022-02-18 MED ORDER — TRAMADOL HCL 50 MG PO TABS: 50.0000 mg | ORAL_TABLET | Freq: Three times a day (TID) | ORAL | 0 refills | Status: AC | PRN

## 2022-02-18 NOTE — Patient Instructions (Addendum)
Continue to do PT ?See me in 10-12 weeks ?

## 2022-02-18 NOTE — Telephone Encounter (Signed)
Pt would like for Maria [name] to provide her names of great female physicians if she can not accept her as a new pt. Please advise.  ?

## 2022-02-18 NOTE — Telephone Encounter (Signed)
Pt would like to switch back to Dr.Blyth. She stated last year Dr.Blyth advised her she would take her back, and she would like to know if that is still an option. She is aware Dr.Blyth is not currently taking np. Please advise.   ?

## 2022-02-18 NOTE — Assessment & Plan Note (Signed)
Severe arthritic changes noted.  Patient has had progression over the course of time.  Patient wants to avoid any surgical intervention.  Is taking 30 mg of Cymbalta and will consider increasing.  Patient states that she has had good strength and improvement in pain when patient is taking tramadol.  We discussed with patient to avoid any type of significant side effects.  We discussed this.  Patient has been taking a half a pill.  Does feel like it has helped a significant amount of some of the pain.  Patient was given some tramadol to have on hand to use more on an as-needed basis.  Encouraged her not to try to use it daily if possible.  When taking it to take it with the Tylenol.  Follow-up again in 6 to 8 weeks ?

## 2022-02-18 NOTE — Telephone Encounter (Signed)
Called patient to get her scheduled. ? ?When she calls please send to me so I can schedule. ?

## 2022-02-19 NOTE — Telephone Encounter (Signed)
Would you be willing to accept this patient? ?

## 2022-02-19 NOTE — Telephone Encounter (Signed)
Patient called back this morning to schedule.. I let her know about the stuff going on and she said she did not want to put anything else on Blyth. SHE LOVES BLYTH and is praying and wishes her the best.  ? ?She also heard about E. Crawford at [facility]... patient would like to know if Charlett Blake reccomends her or anyone else for her care.  ?

## 2022-02-23 NOTE — Telephone Encounter (Signed)
Fine to [facility] but should keep care with PCP until change ?

## 2022-02-24 ENCOUNTER — Encounter: Payer: Self-pay | Admitting: Physical Therapy

## 2022-02-24 ENCOUNTER — Ambulatory Visit: Payer: Medicare Other | Admitting: Physical Therapy

## 2022-02-24 DIAGNOSIS — M545 Low back pain, unspecified: Secondary | ICD-10-CM

## 2022-02-24 DIAGNOSIS — M25571 Pain in right ankle and joints of right foot: Secondary | ICD-10-CM

## 2022-02-24 DIAGNOSIS — R293 Abnormal posture: Secondary | ICD-10-CM

## 2022-02-24 NOTE — Therapy (Signed)
? ?OUTPATIENT PHYSICAL THERAPY TREATMENT NOTE ? ? ? ?Patient Name: Maria [name] ?MRN: [id number] ?DOB:[birth year], 86 y.o., female ?Today's Date: 02/24/2022 ? ?PCP: Janith Lima, MD ?REFERRING PROVIDER: Janith Lima, MD ? ? PT End of Session - 02/24/22 1110   ? ? Visit Number 11   ? Number of Visits 16   ? Date for PT Re-Evaluation 03/25/22   ? Authorization Type UHC MCR   ? PT Start Time 1105   ? PT Stop Time 1145   ? PT Time Calculation (min) 40 min   ? Activity Tolerance Patient tolerated treatment well   ? Behavior During Therapy [facility] for tasks assessed/performed   ? ?  ?  ? ?  ? ? ? ? ? ? ? ? ?Past Medical History:  ?Diagnosis Date  ? Cataract   ? Complication of anesthesia   ? slow to awaken  ? Depression   ? Diverticulosis   ? Goiter, nodular   ? [place] of thyroid stable 7/08  ? Headache   ? hx of  ? Heart murmur   ? due to rheumatic fever as child  ? Hemochromatosis, hereditary ([place]) 09/01/2019  ? Mitral valve prolapse   ? Multiple thyroid nodules 11/24/2015  ? Neoplasm of lung, malignant ([place]) 2002  ? Non-small cell surgical tx  ? Osteoarthritis 11/14/2013  ? 2010 intra-articular steroids X 3 S/P Physical Therapy in 12/14 TKR 12/11/13   ? Osteopenia   ? Dr Pamala Hurry  ? PONV (postoperative nausea and vomiting)   ? n/v  ? Sleep apnea   ? Vitamin D deficiency   ? ?Past Surgical History:  ?Procedure Laterality Date  ? ABDOMINAL HYSTERECTOMY  1998  ? No BSO, for dysfunctional menses  ? BLADDER SUSPENSION Bilateral   ? BREAST BIOPSY Right   ? COLONOSCOPY  2009 , 2014  ? Diverticulosis; Dr. Olevia Perches  ? LOBECTOMY  12/02  ? RU; no radiation or chemo, Dr. Arlyce Dice  ? TONSILLECTOMY    ? TOTAL KNEE ARTHROPLASTY Right 12/11/2013  ? Procedure: RIGHT TOTAL KNEE ARTHROPLASTY;  Surgeon: Gearlean Alf, MD;  Location: [facility];  Service: Orthopedics;  Laterality: Right;  ? TOTAL KNEE ARTHROPLASTY Left 06/28/2017  ? Procedure: LEFT TOTAL KNEE ARTHROPLASTY;  Surgeon: Gaynelle Arabian, MD;  Location: [facility];  Service: Orthopedics;   Laterality: Left;  Adductor Block  ? ?Patient Active Problem List  ? Diagnosis Date Noted  ? Malignant neoplasm of right ovary ([place]) 09/18/2021  ? Coronary atherosclerosis due to calcified coronary lesion 09/17/2021  ? Degenerative cervical spinal stenosis 02/03/2021  ? Routine general medical examination at a health care facility 02/03/2021  ? Weakness of right hand 12/31/2020  ? Arthritis of midfoot 06/10/2020  ? HTN (hypertension) 11/08/2019  ? Hemochromatosis 09/06/2019  ? Preventative health care 06/16/2016  ? OSA (obstructive sleep apnea) 06/15/2016  ? Depression 05/14/2016  ? Multiple thyroid nodules 11/24/2015  ? Degenerative arthritis of lumbar spine 09/03/2014  ? OA (osteoarthritis) of knee 12/11/2013  ? Osteoarthritis 11/14/2013  ? BENIGN POSITIONAL VERTIGO 01/17/2010  ? Urinary incontinence 04/26/2009  ? Hyperlipidemia, mixed 02/04/2009  ? Vitamin D deficiency 10/23/2008  ? Osteoporosis 10/23/2008  ? NEOPLASM, MALIGNANT, LUNG, NON-SMALL CELL 11/21/2007  ? DIVERTICULOSIS 06/15/2007  ? GOITER, NODULAR 04/14/2007  ? ? ?REFERRING DIAG: M47.896 (ICD-10-CM) - Other osteoarthritis of spine, lumbar region M19.079 (ICD-10-CM) - Arthritis of midfoot  ? ?THERAPY DIAG:  ?Chronic midline low back pain without sciatica ? ?Abnormal posture ? ?Pain in right ankle  and joints of right foot ? ?PERTINENT HISTORY: total knee bilaterally, ovarian cancer with chemo, OA, osteopenia  ? ?PRECAUTIONS: cancer ? ?SUBJECTIVE:  ? I have no pain right now.  My quality of life is better because I have less pain (takes 1/2 tramadol in AM) .  I have been doing the exercises on my stomach at home. They really help. I want to be able to walk my dog around [facility].  ?  ?PAIN:  ? ?Are you having pain? No  ?NPRS scale: none right now ?Pain location: back.  No pain in ankle.  ?Pain orientation: Right  ?PAIN TYPE: sore ?Pain description: intermittent  ?Aggravating factors: walking , being on my feet all day  ?Relieving factors: rest   ? ?OBJECTIVE:  ?  ?DIAGNOSTIC FINDINGS:  ?Xray R ankle 10/09/2021 ?IMPRESSION: ?1. Prominent right ankle osteoarthritis.  No acute bony abnormality. ?  ?  ?Xray lumbar 10/09/2021 ?IMPRESSION: ?1. Stable multilevel lumbar spondylosis, facet hypertrophy, and ?right convex scoliosis. ?2. No acute bony abnormality. ?3. Suspected peritoneal calcification as above, compatible with the ?presumed ovarian cancer and peritoneal carcinomatosis seen on prior ?CT. ?  ?PATIENT SURVEYS:  ?FOTO done 12/10/21 47% able , 01/19/22 48% (goal is 58%) ?  ?COGNITION: ?         Overall cognitive status: Within functional limits for tasks assessed      ?Increased time for processing HEP instructions today 12/10/21         ?          ?SENSATION: ?         Light touch: Appears intact ?         Stereognosis: Appears intact ?         Hot/Cold: Appears intact ?         Proprioception: Limited in bilateral fet, secondary to chemo.   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  ?MUSCLE LENGTH: ?Hamstrings: tight, 30-35 deg SLR  ?Thomas test:  ?  ?POSTURE:  ?Flexed posture, knees flexed, posterior pelvic tilt ?Shoulders in extension to counterbalance trunk in standing  ?  ?PALPATION: ?Pain along Rt dorsum of foot and laterally.  ?  ? ?Total Score: 45/56  ? Indicates need for cane or AD out of the house  ? ? ?LUMBARAROM/PROM ?  ?A/PROM A/PROM  ?12/09/2021 AROM ?01/28/22  ?Flexion 25% min pain  Mid shin no pain   ?Extension 75% limited tight but no pain  50%  "feels good"  ?Right lateral flexion NT NT  ?Left lateral flexion NT NT  ?Right rotation WFL tight  WFL  ?Left rotation WFL tight WFL   ? (Blank rows = not tested) ?  ?LE AROM/PROM: ?  ?A/PROM Right ?12/09/2021 Left ?12/09/2021  ?Hip flexion      ?Hip extension      ?Hip abduction      ?Hip adduction      ?Hip internal rotation      ?Hip external rotation      ?Knee flexion      ?Knee extension      ?Ankle dorsiflexion 2   0  ?Ankle  plantarflexion 60   60  ?Ankle inversion  40  45  ?Ankle eversion  18  32  ? (Blank rows = not tested) ?  ?LE MMT: ?  ?MMT Right ?12/09/2021 Left ?12/09/2021 Rt ?01/28/22 Lt.  ?01/28/22  ?Hip flexion 4/5 4+/5 4 4+  ?Hip extension  3- 3-  ?Hip abduction     3- 3-  ?Hip adduction        ?Hip internal rotation        ?Hip external rotation        ?Knee flexion     4+ 4+  ?Knee extension     4+ 4+  ?Ankle dorsiflexion 5 5 5 5   ?Ankle plantarflexion 4 NT    ?Ankle inversion 4/5 pain  5    ?Ankle eversion 4-/5 pain  4    ? (Blank rows = not tested) ?  ?LUMBAR SPECIAL TESTS:  ?NT ?  ?FUNCTIONAL TESTS:  ?5 times sit to stand: 12/16/21: 23.5 sec (without UE) ?          01/28/22 : 15.7 sec  ?Timed up and go (TUG): 12/16/21: 16.3 sec ?Berg Balance Scale:12/10/21: 45/56 ?  ?GAIT: ?Distance walked: 150 ?Assistive device utilized: None ?Level of assistance: Modified independence ?Comments: trunk flexed  ? ? ? ? ?PATIENT EDUCATION:  ?Education details: hip weakness, standing posture, POC, goals  ?Person educated: Patient ?Education method: Explanation ?Education comprehension: verbalized understanding ? ?[facility] Adult PT Treatment:                                                DATE: 02/24/22 ?Therapeutic Exercise: ?Prone on pillows hamstring curl 4 lbs x 10 each LE  ?Prone hip extension 2 ways x 10 x 4 lbs each  ?Prone upper back extension x 10 ?Manual Therapy: ?PROM bilateral hips in prone, quad stretch ?Manual to bilateral lumbar paraspinals, compression/light  ?Self Care: ?Walking with dog, lifting dog , curb and uneven surfaces outside   ? ? [facility] Adult PT Treatment:                                                DATE: 02/13/22 ?Therapeutic Exercise: ?NuStep L5 UE and LE for 5 min  ?Prone hamstring curls x 10 each ?Prone hip extension x 10 each  ?Prone knee flexion with hip extension x 4 each ?Sidelying- clam yellow 10 x 2  ?Sidelying -reverse clam 10 x 2 AROM ?Bridge -yellow banded x 10 ? ? ? ?[facility] Adult PT Treatment:                                                 DATE: 01/27/22 ?Therapeutic Exercise: ?NuStep L5 UE and LE for 6 min  ?Prone hip extension x 10 each  ?Seated clam green band x 15  ?Sit to stand with green band on thighs x 10  ?Standing hip abdu

## 2022-02-26 NOTE — Telephone Encounter (Signed)
Pt called in pretty upset stating she was told that she could expect a call from [facility] to set up an appt with Dr. Sharlet Salina. I apologized and informed the pt that we could get her scheduled with Dr. Sharlet Salina for [facility] but her first new pt/ TOC was Oct 10. Pt then states well why does she say she is accepting new pts if the wait is so long.  ? ?Pt declines the first available appt and disconnects the call. ? ?FYI  ?

## 2022-03-04 ENCOUNTER — Ambulatory Visit: Payer: Medicare Other | Admitting: Physical Therapy

## 2022-03-04 ENCOUNTER — Encounter: Payer: Self-pay | Admitting: Physical Therapy

## 2022-03-04 ENCOUNTER — Ambulatory Visit: Payer: Medicare Other | Attending: Family Medicine | Admitting: Physical Therapy

## 2022-03-04 DIAGNOSIS — R293 Abnormal posture: Secondary | ICD-10-CM | POA: Insufficient documentation

## 2022-03-04 DIAGNOSIS — M545 Low back pain, unspecified: Secondary | ICD-10-CM | POA: Insufficient documentation

## 2022-03-04 DIAGNOSIS — M25571 Pain in right ankle and joints of right foot: Secondary | ICD-10-CM | POA: Insufficient documentation

## 2022-03-04 DIAGNOSIS — G8929 Other chronic pain: Secondary | ICD-10-CM | POA: Insufficient documentation

## 2022-03-04 NOTE — Therapy (Addendum)
OUTPATIENT PHYSICAL THERAPY TREATMENT NOTE DISCHARGE    Patient Name: Maria [name] MRN: [id number] DOB:19-Jan-1936, 86 y.o., female Today's Date: 03/04/2022  PCP: Janith Lima, MD REFERRING PROVIDER: Janith Lima, MD   PT End of Session - 03/04/22 0851     Visit Number 12    Number of Visits 16    Date for PT Re-Evaluation 03/25/22    Authorization Type UHC MCR    PT Start Time 6010    PT Stop Time 0929    PT Time Calculation (min) 39 min                   Past Medical History:  Diagnosis Date   Cataract    Complication of anesthesia    slow to awaken   Depression    Diverticulosis    Goiter, nodular    [place] of thyroid stable 7/08   Headache    hx of   Heart murmur    due to rheumatic fever as child   Hemochromatosis, hereditary ([place]) 09/01/2019   Mitral valve prolapse    Multiple thyroid nodules 11/24/2015   Neoplasm of lung, malignant ([place]) 2002   Non-small cell surgical tx   Osteoarthritis 11/14/2013   2010 intra-articular steroids X 3 S/P Physical Therapy in 12/14 TKR 12/11/13    Osteopenia    Dr Pamala Hurry   PONV (postoperative nausea and vomiting)    n/v   Sleep apnea    Vitamin D deficiency    Past Surgical History:  Procedure Laterality Date   ABDOMINAL HYSTERECTOMY  1998   No BSO, for dysfunctional menses   BLADDER SUSPENSION Bilateral    BREAST BIOPSY Right    COLONOSCOPY  2009 , 2014   Diverticulosis; Dr. Olevia Perches   LOBECTOMY  12/02   RU; no radiation or chemo, Dr. Arlyce Dice   TONSILLECTOMY     TOTAL KNEE ARTHROPLASTY Right 12/11/2013   Procedure: RIGHT TOTAL KNEE ARTHROPLASTY;  Surgeon: Gearlean Alf, MD;  Location: [facility];  Service: Orthopedics;  Laterality: Right;   TOTAL KNEE ARTHROPLASTY Left 06/28/2017   Procedure: LEFT TOTAL KNEE ARTHROPLASTY;  Surgeon: Gaynelle Arabian, MD;  Location: [facility];  Service: Orthopedics;  Laterality: Left;  Adductor Block   Patient Active Problem List   Diagnosis Date Noted   Malignant neoplasm  of right ovary ([place]) 09/18/2021   Coronary atherosclerosis due to calcified coronary lesion 09/17/2021   Degenerative cervical spinal stenosis 02/03/2021   Routine general medical examination at a health care facility 02/03/2021   Weakness of right hand 12/31/2020   Arthritis of midfoot 06/10/2020   HTN (hypertension) 11/08/2019   Hemochromatosis 09/06/2019   Preventative health care 06/16/2016   OSA (obstructive sleep apnea) 06/15/2016   Depression 05/14/2016   Multiple thyroid nodules 11/24/2015   Degenerative arthritis of lumbar spine 09/03/2014   OA (osteoarthritis) of knee 12/11/2013   Osteoarthritis 11/14/2013   BENIGN POSITIONAL VERTIGO 01/17/2010   Urinary incontinence 04/26/2009   Hyperlipidemia, mixed 02/04/2009   Vitamin D deficiency 10/23/2008   Osteoporosis 10/23/2008   NEOPLASM, MALIGNANT, LUNG, NON-SMALL CELL 11/21/2007   DIVERTICULOSIS 06/15/2007   GOITER, NODULAR 04/14/2007    REFERRING DIAG: M47.896 (ICD-10-CM) - Other osteoarthritis of spine, lumbar region M19.079 (ICD-10-CM) - Arthritis of midfoot   THERAPY DIAG:  Chronic midline low back pain without sciatica  Abnormal posture  Pain in right ankle and joints of right foot  PERTINENT HISTORY: total knee bilaterally, ovarian cancer with chemo, OA, osteopenia  PRECAUTIONS: cancer  SUBJECTIVE:   Lately the foot has been hurting. I might try soaking it in epsom salt. I am walking around [facility] with an ache in the ankle, low level pain. The walk takes 20 minutes with the dog stopping a lot.    PAIN:   Are you having pain? No  NPRS scale: none right now Pain location: back.  No pain in ankle.  Pain orientation: Right  PAIN TYPE: sore Pain description: intermittent  Aggravating factors: walking , being on my feet all day  Relieving factors: rest   OBJECTIVE:    DIAGNOSTIC FINDINGS:  Xray R ankle 10/09/2021 IMPRESSION: 1. Prominent right ankle osteoarthritis.  No acute bony abnormality.      Xray lumbar 10/09/2021 IMPRESSION: 1. Stable multilevel lumbar spondylosis, facet hypertrophy, and right convex scoliosis. 2. No acute bony abnormality. 3. Suspected peritoneal calcification as above, compatible with the presumed ovarian cancer and peritoneal carcinomatosis seen on prior CT.   PATIENT SURVEYS:  FOTO done 12/10/21 47% able , 01/19/22 48% (goal is 58%)   COGNITION:          Overall cognitive status: Within functional limits for tasks assessed      Increased time for processing HEP instructions today 12/10/21                   SENSATION:          Light touch: Appears intact          Stereognosis: Appears intact          Hot/Cold: Appears intact          Proprioception: Limited in bilateral fet, secondary to chemo.   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     Total Score: 45/56   Indicates need for cane or AD out of the house    LUMBARAROM/PROM   A/PROM A/PROM  12/09/2021 AROM 01/28/22  Flexion 25% min pain  Mid shin no pain   Extension 75% limited tight but no pain  50%  "feels good"  Right lateral flexion NT NT  Left lateral flexion NT NT  Right rotation [facility] tight  WFL  Left rotation [facility] tight WFL    (Blank rows = not tested)   LE AROM/PROM:   A/PROM Right 12/09/2021 Left 12/09/2021  Hip flexion      Hip extension      Hip abduction      Hip adduction      Hip internal rotation      Hip external rotation      Knee flexion      Knee extension      Ankle dorsiflexion 2   0  Ankle plantarflexion 60   60  Ankle inversion  40  45  Ankle eversion  18  32   (Blank rows = not tested)   LE MMT:   MMT Right 12/09/2021 Left 12/09/2021  Rt 01/28/22 Lt.  01/28/22 Rt 03/04/22 Lt 03/04/22  Hip flexion 4/5 4+/5 4 4+    Hip extension     3- 3- 3 3  Hip abduction     3- 3- 3 3  Hip adduction  Hip internal rotation          Hip external rotation          Knee flexion     4+ 4+    Knee extension     4+ 4+    Ankle dorsiflexion 5 5 5 5     Ankle plantarflexion 4 NT      Ankle inversion 4/5 pain  5      Ankle eversion 4-/5 pain  4       (Blank rows = not tested)   LUMBAR SPECIAL TESTS:  NT   FUNCTIONAL TESTS:  5 times sit to stand: 12/16/21: 23.5 sec (without UE)           01/28/22 : 15.7 sec  Timed up and go (TUG): 12/16/21: 16.3 sec Berg Balance Scale:12/10/21: 45/56, 03/04/22: 49/56   GAIT: Distance walked: 150 Assistive device utilized: None Level of assistance: Modified independence Comments: trunk flexed      PATIENT EDUCATION:  Education details: hip weakness, standing posture, POC, goals  Person educated: Patient Education method: Explanation Education comprehension: verbalized understanding  [facility] Adult PT Treatment:                                                DATE: 03/04/22 Physical Performance Test: BERG BALANCE TEST Sitting to Standing: 4.      Stands without using hands and stabilize independently Standing Unsupported: 4.      Stands safely for 2 minutes Sitting Unsupported: 4.     Sits for 2 minutes independently Standing to Sitting: 4.     Sits safely with minimal use of hands Transfers: 4.     Transfers safely with minor use of hands Standing with eyes closed: 4.     Stands safely for 10 seconds  Standing with feet together: 3.     Stands for 1 minute with supervision Reaching forward with outstretched arm: 3.     Reaches forward 5 inches Retrieving object from the floor: 4.      Able to pick up easily and safely Turning to look behind: 4.     Looks behind from both sides and weight shifts well Turning 360 degrees: 2.     Able to turn slowly, but safely Place alternate foot on stool: 4.      Completes 8 steps in 20 seconds     Standing with one foot in front: 3.     Independent foot ahead for 30 seconds Standing on one foot: 2.     Holds >/=3 seconds  Total Score: 49/56   Therapeutic Exercise: MMT Prone on pillows hamstring curl 4 lbs x 10 each LE  Prone hip extension x 10 each  Side hip abduction x 10 each     [facility] Adult PT Treatment:                                                DATE: 02/24/22 Therapeutic Exercise: Prone on pillows hamstring curl 4 lbs x 10 each LE  Prone hip extension 2 ways x 10 x 4 lbs each  Prone upper back extension x 10 Manual Therapy: PROM bilateral hips in prone, quad stretch Manual to bilateral lumbar paraspinals, compression/light  Self Care: Walking with dog, lifting dog , curb and uneven surfaces outside     [facility] Adult PT Treatment:                                                DATE: 02/13/22 Therapeutic Exercise: NuStep L5 UE and LE for 5 min  Prone hamstring curls x 10 each Prone hip extension x 10 each  Prone knee flexion with hip extension x 4 each Sidelying- clam yellow 10 x 2  Sidelying -reverse clam 10 x 2 AROM Bridge -yellow banded x 10    [facility] Adult PT Treatment:                                                DATE: 01/27/22 Therapeutic Exercise: NuStep L5 UE and LE for 6 min  Prone hip extension x 10 each  Seated clam green band x 15  Sit to stand with green band on thighs x 10  Standing hip abduction x 15 each with UE support  Self Care: Renewal , POC, goals  and FOTO     HOME EXERCISE PROGRAM:  Access Code: [facility] URL: [email or web address] Date: 01/28/2022 Prepared by: Raeford Razor  Exercises - Seated Ankle Inversion with Resistance and Legs Crossed  - 2 x daily - 7 x weekly - 2 sets - 10 reps - 5 hold - Seated Ankle Eversion with Resistance  - 2 x daily - 7 x weekly - 2 sets - 10 reps - 5 hold - Standing Gastroc Stretch  - 2 x daily - 7 x weekly - 1 sets - 5 reps - 30 hold - Seated  Hamstring Stretch  - 2 x daily - 7 x weekly - 1 sets - 5 reps - 30 hold - Hooklying Clamshell with Resistance  - 5 x daily - 7 x weekly - 2 sets - 10 reps - 5 hold - Supine Bridge with Resistance Band  - 1 x daily - 7 x weekly - 2 sets - 10 reps - 5 hold - Standing Hip Abduction with Anterior Support  - 1 x daily - 7 x weekly - 2 sets - 10 reps - 5 hold - Standing Marching  - 1 x daily - 7 x weekly - 2 sets - 10 reps - 5 hold ASSESSMENT:   CLINICAL IMPRESSION: BERG improved from 45/56-49/56. Pt report overall less pain and improved tolerance to walking dog 20 minutes with pain less than 6/10. She can perform Pt doing well, noticing less pain overall.    REHAB POTENTIAL: Good   CLINICAL DECISION MAKING: Stable/uncomplicated   EVALUATION COMPLEXITY: Low     GOALS: Goals reviewed with patient? No   SHORT TERM GOALS:   STG Name Target Date Goal status  1 Pt will be independent with HEP for ankle, foot and trunk Baseline:  12/24/2021 met  2 Pt will be able to report less pain in ankle when walking in her home, < 5/10 Baseline:  01/07/2022 met  3 Pt will be able to safely walk her dog short distances with walker and improved  confidence in her ankle, balance.  Baseline: 01/07/2022 met  4 Pt will report back pain reduced following HEP and PT intervention  Baseline: 01/07/2022 met  5 Pt will be screened for balance and goal set  12/24/21 met  LONG TERM GOALS:    LTG Name Target Date Goal status  1 Pt will improve FOTO score based on initial result  Baseline: 02/04/2022 ongoing  2 Pt will be able to complete basic home tasks , light housework with improved levels of back pain most of the time (< 5/10)  Baseline: Status: washing dishes, laundry <5/10.  Can reach 5-6/10 with 20 minute walk of dog-03/04/22 02/04/2022 MET  3 Pt will be able to increase hip abduction, extension strength to 4-/5 or better bilaterally in order to show improved gait stability  Baseline: 3-/5 hip ext, 3-/5 abd Status: 3/5  hip extension and abduction-03/04/22 02/04/2022 Ongoing   4 Pt will be able to return to the [place] center/community exercise for lifelong fitness and health with pain managed Baseline: 02/04/2022 Ongoing   5 Pt will be I with HEP for balance and strength upon discharge  Baseline: 02/04/2022 Ongoing   6 Balance score will improve to 51/56 to demo reduced fall risk.   Baseline: Status: 49/56 03/04/22 02/04/2022 Ongoing   PLAN: PT FREQUENCY: 1-2x/week   PT DURATION: 6  weeks   PLANNED INTERVENTIONS: Therapeutic exercises, Therapeutic activity, Neuro Muscular re-education, Balance training, Gait training, Patient/Family education, Joint mobilization, Aquatic Therapy, Dry Needling, Moist heat, and Manual therapy   PLAN FOR NEXT SESSION:  Develop Balance HEP (narrow, tandem and SLS) , ankle strengthening   Pilates? Cont standing postural strength, strengthen back extensors (supported Prone)   PHYSICAL THERAPY DISCHARGE SUMMARY  Visits from Start of Care: 12  Current functional level related to goals / functional outcomes: See above for most current    Remaining deficits: Weakness, posture, gait    Education / Equipment: Posture, HEP    Patient agrees to discharge. Patient goals were partially met. Patient is being discharged due to a change in medical status.  Patient in [facility] per chart.   Raeford Razor, PT 06/08/22 12:18 PM Phone: [phone] Fax: [phone]

## 2022-03-18 ENCOUNTER — Ambulatory Visit: Payer: Medicare Other | Admitting: Physical Therapy

## 2022-03-20 ENCOUNTER — Ambulatory Visit: Payer: Medicare Other | Admitting: Family Medicine

## 2022-03-27 ENCOUNTER — Telehealth: Payer: Self-pay

## 2022-03-27 NOTE — Telephone Encounter (Signed)
Pt stated that she has cramp like pain in her back and it was in her leg as well yesterday. Pts back pain just started getting worse as of 3 days ago but has been going on for month. Pt believes this is because of the atorvastatin and would like to come off of her cholesterol medication. She does not want to take try any other medications because she believes her cholesterol is fine. Please advise.

## 2022-03-29 ENCOUNTER — Other Ambulatory Visit: Payer: Self-pay | Admitting: Cardiology

## 2022-03-29 DIAGNOSIS — I2510 Atherosclerotic heart disease of native coronary artery without angina pectoris: Secondary | ICD-10-CM

## 2022-04-03 NOTE — Telephone Encounter (Signed)
Tried calling patient no answer left a vm

## 2022-04-03 NOTE — Telephone Encounter (Signed)
Given her symptoms would recommend stopping atorvastatin until his symptoms resolve. When she is back to baseline would recommend restarting atorvastatin at a lower dose. Currently she is on atorvastatin 20 mg p.o. nightly.  She could cut the tablet in half and take atorvastatin 10 mg p.o. nightly and reevaluate.  Cholesterol medications recommended given the degree of coronary calcification. Please let me know if any other questions or concerns arise. I recommended a 1 year follow-up visit in 2024 -please have this scheduled.  Best regards,  Rex Kras, DO, [facility]

## 2022-04-06 ENCOUNTER — Telehealth: Payer: Self-pay | Admitting: Family Medicine

## 2022-04-06 NOTE — Telephone Encounter (Signed)
Patient called asking to cancel an upcoming appointment with Dr Tamala Julian. She said that has had some new issues come up with oncology and will be seeing [facility]. She asked for Dr Tamala Julian to look at her chart through [facility] for more information.

## 2022-04-07 ENCOUNTER — Ambulatory Visit: Payer: Medicare Other | Admitting: Family Medicine

## 2022-04-07 NOTE — Telephone Encounter (Signed)
Called and discussed, told her we are here if she needs anything

## 2022-04-13 ENCOUNTER — Telehealth: Payer: Self-pay | Admitting: Neurology

## 2022-04-13 NOTE — Telephone Encounter (Signed)
..   Pt understands that although there may be some limitations with this type of visit, we will take all precautions to reduce any security or privacy concerns.  Pt understands that this will be treated like an in office visit and we will file with pt's insurance, and there may be a patient responsible charge related to this service. ? ?

## 2022-04-14 NOTE — Telephone Encounter (Signed)
Called and spoke to patient she said her cancer has came back and right now she does not want to take anything because she doesn't know what is going to happen she stated she will call back to schedule her 1 year f/u

## 2022-04-14 NOTE — Progress Notes (Unsigned)
Subjective:    Patient ID: Maria [name] is a 86 y.o. female.  HPI {Common ambulatory SmartLinks:19316}  Review of Systems  Neurological:        CPAP follow-up via video visit.    Objective:  Neurological Exam  Physical Exam  Assessment:   ***  Plan:   ***

## 2022-04-14 NOTE — Telephone Encounter (Signed)
Okay, let her know she can reach out sooner if needed.   Dr. Terri Skains

## 2022-04-15 ENCOUNTER — Encounter: Payer: Self-pay | Admitting: Neurology

## 2022-04-15 ENCOUNTER — Telehealth (INDEPENDENT_AMBULATORY_CARE_PROVIDER_SITE_OTHER): Payer: Medicare Other | Admitting: Neurology

## 2022-04-15 DIAGNOSIS — Z9989 Dependence on other enabling machines and devices: Secondary | ICD-10-CM

## 2022-04-15 DIAGNOSIS — G4733 Obstructive sleep apnea (adult) (pediatric): Secondary | ICD-10-CM | POA: Diagnosis not present

## 2022-04-15 NOTE — Patient Instructions (Signed)
Follow-up in 1 year, we will consider home sleep test to be able to prescribe a new machine.  We will try to get a set of send out home sleep test equipment for convenience.

## 2022-04-23 NOTE — Progress Notes (Deleted)
[place] Phone: [phone] Subjective:    I'm seeing this patient by the request  of:  Janith Lima, MD  CC:   [id number]  02/18/2022 Severe arthritic changes noted.  Patient has had progression over the course of time.  Patient wants to avoid any surgical intervention.  Is taking 30 mg of Cymbalta and will consider increasing.  Patient states that she has had good strength and improvement in pain when patient is taking tramadol.  We discussed with patient to avoid any type of significant side effects.  We discussed this.  Patient has been taking a half a pill.  Does feel like it has helped a significant amount of some of the pain.  Patient was given some tramadol to have on hand to use more on an as-needed basis.  Encouraged her not to try to use it daily if possible.  When taking it to take it with the Tylenol.  Follow-up again in 6 to 8 weeks  Updated 04/29/2022 TAWANNA FUNK is a 86 y.o. female coming in with complaint of back pain       Past Medical History:  Diagnosis Date   Cataract    Complication of anesthesia    slow to awaken   Depression    Diverticulosis    Goiter, nodular    [place] of thyroid stable 7/08   Headache    hx of   Heart murmur    due to rheumatic fever as child   Hemochromatosis, hereditary ([place]) 09/01/2019   Mitral valve prolapse    Multiple thyroid nodules 11/24/2015   Neoplasm of lung, malignant ([place]) 2002   Non-small cell surgical tx   Osteoarthritis 11/14/2013   2010 intra-articular steroids X 3 S/P Physical Therapy in 12/14 TKR 12/11/13    Osteopenia    Dr Pamala Hurry   PONV (postoperative nausea and vomiting)    n/v   Sleep apnea    Vitamin D deficiency    Past Surgical History:  Procedure Laterality Date   ABDOMINAL HYSTERECTOMY  1998   No BSO, for dysfunctional menses   BLADDER SUSPENSION Bilateral    BREAST BIOPSY Right    COLONOSCOPY  2009 , 2014    Diverticulosis; Dr. Olevia Perches   LOBECTOMY  12/02   RU; no radiation or chemo, Dr. Arlyce Dice   TONSILLECTOMY     TOTAL KNEE ARTHROPLASTY Right 12/11/2013   Procedure: RIGHT TOTAL KNEE ARTHROPLASTY;  Surgeon: Gearlean Alf, MD;  Location: [facility];  Service: Orthopedics;  Laterality: Right;   TOTAL KNEE ARTHROPLASTY Left 06/28/2017   Procedure: LEFT TOTAL KNEE ARTHROPLASTY;  Surgeon: Gaynelle Arabian, MD;  Location: [facility];  Service: Orthopedics;  Laterality: Left;  Adductor Block   Social History   Socioeconomic History   Marital status: Divorced    Spouse name: Not on file   Number of children: Not on file   Years of education: college   Highest education level: Not on file  Occupational History   Occupation: Retired    Fish farm manager: RETIRED  Tobacco Use   Smoking status: Former    Packs/day: 0.25    Years: 20.00    Total pack years: 5.00    Types: Cigarettes    Quit date: 05/03/1979    Years since quitting: 43.0   Smokeless tobacco: Never   Tobacco comments:    smoked 1962-1980, up to < 1 ppd  Vaping Use   Vaping Use: Never used  Substance and Sexual Activity   Alcohol use: Yes    Alcohol/week: 7.0 standard drinks of alcohol    Types: 7 Standard drinks or equivalent per week    Comment: occasional   Drug use: No   Sexual activity: Not Currently    Comment: lives alone, dog, no dietary restrictions, eating heart healthy  Other Topics Concern   Not on file  Social History Narrative   Pt gets regular exercise.   Drinks 2 cups of coffee a day    Social Determinants of Health   Financial Resource Strain: Low Risk  (03/25/2020)   Overall Financial Resource Strain (CARDIA)    Difficulty of Paying Living Expenses: Not hard at all  Food Insecurity: No Food Insecurity (03/25/2020)   Hunger Vital Sign    Worried About Running Out of Food in the Last Year: Never true    Ran Out of Food in the Last Year: Never true  Transportation Needs: No Transportation Needs (03/25/2020)   PRAPARE -  Hydrologist (Medical): No    Lack of Transportation (Non-Medical): No  Physical Activity: Not on file  Stress: Not on file  Social Connections: Not on file   No Known Allergies Family History  Problem Relation Age of Onset   COPD Mother    Coronary artery disease Father    Colon cancer Father 24   Heart attack Father 40   Stroke Father        > 73   Lung cancer Paternal Aunt        smoker   Cancer Paternal Aunt        lung   Lung cancer Maternal Grandfather        smoker   COPD Maternal Grandfather    Arthritis Sister    Lung cancer Maternal Uncle        smoker   Diabetes Neg Hx      Current Outpatient Medications (Cardiovascular):    atorvastatin (LIPITOR) 20 MG tablet, TAKE 1 TABLET BY MOUTH EVERYDAY AT BEDTIME   Current Outpatient Medications (Analgesics):    acetaminophen (TYLENOL) 500 MG tablet, Take 500 mg by mouth every 6 (six) hours as needed.   aspirin EC 81 MG tablet, Take 1 tablet (81 mg total) by mouth daily. Swallow whole.  Current Outpatient Medications (Hematological):    cyanocobalamin 1000 MCG tablet, Take by mouth.  Current Outpatient Medications (Other):    CALCIUM PO, Take 1 tablet by mouth 3 (three) times daily with meals.   Cholecalciferol (VITAMIN D3 PO), Take 1 tablet by mouth daily.   DULoxetine (CYMBALTA) 30 MG capsule, TAKE 1 CAPSULE BY MOUTH EVERY DAY   Reviewed prior external information including notes and imaging from  primary care provider As well as notes that were available from care everywhere and other healthcare systems.  Past medical history, social, surgical and family history all reviewed in electronic medical record.  No pertanent information unless stated regarding to the chief complaint.   Review of Systems:  No headache, visual changes, nausea, vomiting, diarrhea, constipation, dizziness, abdominal pain, skin rash, fevers, chills, night sweats, weight loss, swollen lymph nodes, body aches,  joint swelling, chest pain, shortness of breath, mood changes. POSITIVE muscle aches  Objective  There were no vitals taken for this visit.   General: No apparent distress alert and oriented x3 mood and affect normal, dressed appropriately.  HEENT: Pupils equal, extraocular movements intact  Respiratory: Patient's speak in full sentences and does not  appear short of breath  Cardiovascular: No lower extremity edema, non tender, no erythema      Impression and Recommendations:

## 2022-04-29 ENCOUNTER — Ambulatory Visit: Payer: Medicare Other | Admitting: Family Medicine

## 2022-05-10 ENCOUNTER — Other Ambulatory Visit: Payer: Self-pay | Admitting: Internal Medicine

## 2022-05-10 DIAGNOSIS — M170 Bilateral primary osteoarthritis of knee: Secondary | ICD-10-CM

## 2022-05-12 ENCOUNTER — Telehealth: Payer: Self-pay

## 2022-05-12 NOTE — Telephone Encounter (Signed)
Attempted to contact patient to schedule a Palliative Care consult appointment. No answer left a message to return call.  

## 2022-05-13 ENCOUNTER — Telehealth: Payer: Self-pay

## 2022-05-13 NOTE — Telephone Encounter (Signed)
Spoke with patient and scheduled a Mychart Palliative Consult for 05/18/22 @ 1 PM.   Consent obtained; updated Netsmart, Team List and Epic.

## 2022-05-18 ENCOUNTER — Encounter: Payer: Self-pay | Admitting: Family

## 2022-05-18 ENCOUNTER — Telehealth: Payer: Medicare Other | Admitting: Hospice

## 2022-05-18 DIAGNOSIS — R531 Weakness: Secondary | ICD-10-CM

## 2022-05-18 DIAGNOSIS — Z515 Encounter for palliative care: Secondary | ICD-10-CM

## 2022-05-18 DIAGNOSIS — C561 Malignant neoplasm of right ovary: Secondary | ICD-10-CM

## 2022-05-18 DIAGNOSIS — G8929 Other chronic pain: Secondary | ICD-10-CM

## 2022-05-18 NOTE — Progress Notes (Signed)
[facility] Consult Note Telephone: [phone]  Fax: [phone]  PATIENT NAME: Maria [name] [id number] [phone] (home)  DOB: 05-28-1936 MRN: [id number]  PRIMARY CARE PROVIDER:   Dr Maria [name] Physician  REFERRING PROVIDER:   Janith Lima, MD [address] [place],  [place] [address] [phone]  RESPONSIBLE PARTY:   Self  Contact Information     Name Relation Home Work Mobile   [name],Maria Son [phone]     Maria [name]   [phone]       TELEHEALTH VISIT STATEMENT Due to the COVID-19 crisis, this visit was done via telemedicine from my office and it was initiated and consent by this patient and or family.  I connected with patient OR PROXY by a telephone/video  and verified that I am speaking with the correct person. I discussed the limitations of evaluation and management by telemedicine. The patient expressed understanding and agreed to proceed. Palliative Care was asked to follow this patient to address advance care planning, complex medical decision making and goals of care clarification. This is the initial visit. Patient's sister Maria [name] is present with patient during visit.     ASSESSMENT AND / RECOMMENDATIONS:   Advance Care Planning: Our advance care planning conversation included a discussion about:    The value and importance of advance care planning  Difference between Hospice and Palliative care Exploration of goals of care in the event of a sudden injury or illness  Identification and preparation of a healthcare agent  Review and updating or creation of an  advance directive document . Decision not to resuscitate or to de-escalate disease focused treatments due to poor prognosis.  CODE STATUS:  Goals of Care: Goals include to maximize quality of life and symptom management  I spent  16 minutes providing this initial consultation. More than  50% of the time in this consultation was spent on counseling patient and coordinating communication. 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 Recently completed chemo/radiation. Follow up appointment with Oncologist 05/22/22.  Low back pain: Continue Oxycontin as ordered. Routine CBC CMP Weakness: PT/OT completed. Fall precautions discussed; reiterated Follow up: Palliative care will continue to follow for complex medical decision making, advance care planning, and clarification of goals. Return 6 weeks or prn. Encouraged to call provider sooner with any concerns.   Family /Caregiver/Community Supports:   HOSPICE ELIGIBILITY/DIAGNOSIS: TBD  Chief Complaint: Initial Palliative care visit  HISTORY OF PRESENT ILLNESS:  Maria [name] is a 86 y.o. year old female  with multiple morbidities requiring close monitoring and with high risk of complications and  mortality: stage 4 Ovarian CA, mets to bone, weakness, low back pain.  History obtained from review of EMR, discussion with primary team, caregiver, family and/or Maria [name].  Review and summarization of Epic records shows history from other than patient. Rest of 10 point ROS asked and negative.  Independent interpretation of tests and reviewed as needed, available labs, patient records, imaging, studies and related documents from the EMR.   PAST MEDICAL HISTORY:  Active Ambulatory Problems    Diagnosis Date Noted   NEOPLASM, MALIGNANT, LUNG, NON-SMALL CELL 11/21/2007   GOITER, NODULAR 04/14/2007   Vitamin D deficiency 10/23/2008   Hyperlipidemia, mixed 02/04/2009   BENIGN POSITIONAL VERTIGO 01/17/2010   DIVERTICULOSIS 06/15/2007   Osteoporosis 10/23/2008   Urinary incontinence 04/26/2009   Osteoarthritis 11/14/2013   OA (osteoarthritis) of knee  12/11/2013   Degenerative  arthritis of lumbar spine 09/03/2014   Multiple thyroid nodules 11/24/2015   Depression 05/14/2016   OSA (obstructive sleep apnea) 06/15/2016   Preventative health care 06/16/2016   Hemochromatosis 09/06/2019   HTN (hypertension) 11/08/2019   Arthritis of midfoot 06/10/2020   Weakness of right hand 12/31/2020   Degenerative cervical spinal stenosis 02/03/2021   Routine general medical examination at a health care facility 02/03/2021   Coronary atherosclerosis due to calcified coronary lesion 09/17/2021   Malignant neoplasm of right ovary ([place]) 09/18/2021   Resolved Ambulatory Problems    Diagnosis Date Noted   NEVUS 10/23/2008   BRONCHITIS, ACUTE WITH MILD BRONCHOSPASM 12/27/2009   INFLUENZA 12/27/2009   POLYARTHRALGIA 10/23/2008   MYALGIA 01/17/2010   CHILLS WITHOUT FEVER 01/17/2010   CHANGE IN BOWELS 11/25/2009   ABDOMINAL PAIN, RECURRENT 11/25/2009   Lung cancer ([place]) 09/13/2012   Hyponatremia 12/12/2013   Hypokalemia 12/12/2013   Acute blood loss anemia 12/14/2013   Constipation 12/15/2013   Knee pain 06/16/2016   History of total knee replacement, left 11/04/2018   Educated about COVID-19 virus infection 03/27/2019   Hemochromatosis, hereditary ([place]) 09/01/2019   Ataxia 12/31/2020   Bilateral neck pain 12/31/2020   Cervicalgia 12/31/2020   Past Medical History:  Diagnosis Date   Cataract    Complication of anesthesia    Diverticulosis    Goiter, nodular    Headache    Heart murmur    Mitral valve prolapse    Neoplasm of lung, malignant ([place]) 2002   Osteopenia    PONV (postoperative nausea and vomiting)    Sleep apnea    Vitamin D deficiency     SOCIAL HX:  Social History   Tobacco Use   Smoking status: Former    Packs/day: 0.25    Years: 20.00    Total pack years: 5.00    Types: Cigarettes    Quit date: 05/03/1979    Years since quitting: 43.0   Smokeless tobacco: Never   Tobacco comments:    smoked 1962-1980, up to < 1 ppd  Substance Use Topics    Alcohol use: Yes    Alcohol/week: 7.0 standard drinks of alcohol    Types: 7 Standard drinks or equivalent per week    Comment: occasional     FAMILY HX:  Family History  Problem Relation Age of Onset   COPD Mother    Coronary artery disease Father    Colon cancer Father 21   Heart attack Father 67   Stroke Father        > 21   Lung cancer Paternal Aunt        smoker   Cancer Paternal Aunt        lung   Lung cancer Maternal Grandfather        smoker   COPD Maternal Grandfather    Arthritis Sister    Lung cancer Maternal Uncle        smoker   Diabetes Neg Hx       ALLERGIES: No Known Allergies    PERTINENT MEDICATIONS:  Outpatient Encounter Medications as of 05/18/2022  Medication Sig   acetaminophen (TYLENOL) 500 MG tablet Take 500 mg by mouth every 6 (six) hours as needed.   aspirin EC 81 MG tablet Take 1 tablet (81 mg total) by mouth daily. Swallow whole.   atorvastatin (LIPITOR) 20 MG tablet TAKE 1 TABLET BY MOUTH EVERYDAY AT BEDTIME   CALCIUM PO Take 1 tablet by mouth  3 (three) times daily with meals.   Cholecalciferol (VITAMIN D3 PO) Take 1 tablet by mouth daily.   cyanocobalamin 1000 MCG tablet Take by mouth.   DULoxetine (CYMBALTA) 30 MG capsule TAKE 1 CAPSULE BY MOUTH EVERY DAY   No facility-administered encounter medications on file as of 05/18/2022.     Thank you for the opportunity to participate in the care of Ms. Farrugia.  The palliative care team will continue to follow. Please call our office at [phone] if we can be of additional assistance.   Note: Portions of this note were generated with L[employer]. Dictation errors may occur despite best attempts at proofreading.  Teodoro Spray, NP

## 2022-05-20 ENCOUNTER — Inpatient Hospital Stay (HOSPITAL_COMMUNITY)
Admission: EM | Admit: 2022-05-20 | Discharge: 2022-05-23 | DRG: 641 | Disposition: A | Discharge status: Hospice | Payer: Medicare Other | Attending: Internal Medicine | Admitting: Internal Medicine

## 2022-05-20 ENCOUNTER — Emergency Department (HOSPITAL_COMMUNITY): Payer: Medicare Other

## 2022-05-20 ENCOUNTER — Other Ambulatory Visit: Payer: Self-pay

## 2022-05-20 ENCOUNTER — Encounter (HOSPITAL_COMMUNITY): Payer: Self-pay

## 2022-05-20 DIAGNOSIS — E871 Hypo-osmolality and hyponatremia: Principal | ICD-10-CM | POA: Diagnosis present

## 2022-05-20 DIAGNOSIS — Z515 Encounter for palliative care: Secondary | ICD-10-CM

## 2022-05-20 DIAGNOSIS — Z96653 Presence of artificial knee joint, bilateral: Secondary | ICD-10-CM | POA: Diagnosis present

## 2022-05-20 DIAGNOSIS — E876 Hypokalemia: Secondary | ICD-10-CM | POA: Diagnosis not present

## 2022-05-20 DIAGNOSIS — R112 Nausea with vomiting, unspecified: Secondary | ICD-10-CM | POA: Diagnosis present

## 2022-05-20 DIAGNOSIS — M899 Disorder of bone, unspecified: Secondary | ICD-10-CM

## 2022-05-20 DIAGNOSIS — R531 Weakness: Secondary | ICD-10-CM | POA: Diagnosis not present

## 2022-05-20 DIAGNOSIS — R627 Adult failure to thrive: Secondary | ICD-10-CM | POA: Diagnosis present

## 2022-05-20 DIAGNOSIS — L89152 Pressure ulcer of sacral region, stage 2: Secondary | ICD-10-CM | POA: Diagnosis present

## 2022-05-20 DIAGNOSIS — G893 Neoplasm related pain (acute) (chronic): Secondary | ICD-10-CM | POA: Diagnosis present

## 2022-05-20 DIAGNOSIS — I2510 Atherosclerotic heart disease of native coronary artery without angina pectoris: Secondary | ICD-10-CM | POA: Diagnosis present

## 2022-05-20 DIAGNOSIS — C569 Malignant neoplasm of unspecified ovary: Secondary | ICD-10-CM | POA: Diagnosis present

## 2022-05-20 DIAGNOSIS — G939 Disorder of brain, unspecified: Secondary | ICD-10-CM | POA: Diagnosis present

## 2022-05-20 DIAGNOSIS — Z801 Family history of malignant neoplasm of trachea, bronchus and lung: Secondary | ICD-10-CM

## 2022-05-20 DIAGNOSIS — R4182 Altered mental status, unspecified: Secondary | ICD-10-CM

## 2022-05-20 DIAGNOSIS — E785 Hyperlipidemia, unspecified: Secondary | ICD-10-CM | POA: Diagnosis present

## 2022-05-20 DIAGNOSIS — Z79899 Other long term (current) drug therapy: Secondary | ICD-10-CM

## 2022-05-20 DIAGNOSIS — G4733 Obstructive sleep apnea (adult) (pediatric): Secondary | ICD-10-CM | POA: Diagnosis present

## 2022-05-20 DIAGNOSIS — F32A Depression, unspecified: Secondary | ICD-10-CM | POA: Diagnosis present

## 2022-05-20 DIAGNOSIS — Z85118 Personal history of other malignant neoplasm of bronchus and lung: Secondary | ICD-10-CM

## 2022-05-20 DIAGNOSIS — C7931 Secondary malignant neoplasm of brain: Secondary | ICD-10-CM | POA: Diagnosis present

## 2022-05-20 DIAGNOSIS — C7951 Secondary malignant neoplasm of bone: Secondary | ICD-10-CM | POA: Diagnosis present

## 2022-05-20 DIAGNOSIS — L8990 Pressure ulcer of unspecified site, unspecified stage: Secondary | ICD-10-CM | POA: Insufficient documentation

## 2022-05-20 DIAGNOSIS — C561 Malignant neoplasm of right ovary: Secondary | ICD-10-CM | POA: Diagnosis present

## 2022-05-20 DIAGNOSIS — I341 Nonrheumatic mitral (valve) prolapse: Secondary | ICD-10-CM | POA: Diagnosis present

## 2022-05-20 DIAGNOSIS — Z87891 Personal history of nicotine dependence: Secondary | ICD-10-CM

## 2022-05-20 DIAGNOSIS — Z8249 Family history of ischemic heart disease and other diseases of the circulatory system: Secondary | ICD-10-CM

## 2022-05-20 DIAGNOSIS — Z79891 Long term (current) use of opiate analgesic: Secondary | ICD-10-CM

## 2022-05-20 DIAGNOSIS — Z66 Do not resuscitate: Secondary | ICD-10-CM | POA: Diagnosis present

## 2022-05-20 LAB — URINALYSIS, ROUTINE W REFLEX MICROSCOPIC
Bilirubin Urine: NEGATIVE
Glucose, UA: NEGATIVE mg/dL
Ketones, ur: 5 mg/dL — AB
Nitrite: NEGATIVE
Protein, ur: NEGATIVE mg/dL
Specific Gravity, Urine: 1.009 (ref 1.005–1.030)
pH: 6 (ref 5.0–8.0)

## 2022-05-20 LAB — CBC WITH DIFFERENTIAL/PLATELET
Abs Immature Granulocytes: 0.06 10*3/uL (ref 0.00–0.07)
Basophils Absolute: 0 10*3/uL (ref 0.0–0.1)
Basophils Relative: 0 %
Eosinophils Absolute: 0.1 10*3/uL (ref 0.0–0.5)
Eosinophils Relative: 1 %
HCT: 44.3 % (ref 36.0–46.0)
Hemoglobin: 14.8 g/dL (ref 12.0–15.0)
Immature Granulocytes: 1 %
Lymphocytes Relative: 4 %
Lymphs Abs: 0.5 10*3/uL — ABNORMAL LOW (ref 0.7–4.0)
MCH: 30.8 pg (ref 26.0–34.0)
MCHC: 33.4 g/dL (ref 30.0–36.0)
MCV: 92.3 fL (ref 80.0–100.0)
Monocytes Absolute: 1.3 10*3/uL — ABNORMAL HIGH (ref 0.1–1.0)
Monocytes Relative: 12 %
Neutro Abs: 9 10*3/uL — ABNORMAL HIGH (ref 1.7–7.7)
Neutrophils Relative %: 82 %
Platelets: 211 10*3/uL (ref 150–400)
RBC: 4.8 MIL/uL (ref 3.87–5.11)
RDW: 12.2 % (ref 11.5–15.5)
WBC: 10.9 10*3/uL — ABNORMAL HIGH (ref 4.0–10.5)
nRBC: 0 % (ref 0.0–0.2)

## 2022-05-20 LAB — COMPREHENSIVE METABOLIC PANEL
ALT: 15 U/L (ref 0–44)
AST: 30 U/L (ref 15–41)
Albumin: 3.4 g/dL — ABNORMAL LOW (ref 3.5–5.0)
Alkaline Phosphatase: 141 U/L — ABNORMAL HIGH (ref 38–126)
Anion gap: 11 (ref 5–15)
BUN: 10 mg/dL (ref 8–23)
CO2: 30 mmol/L (ref 22–32)
Calcium: 8.8 mg/dL — ABNORMAL LOW (ref 8.9–10.3)
Chloride: 90 mmol/L — ABNORMAL LOW (ref 98–111)
Creatinine, Ser: 0.55 mg/dL (ref 0.44–1.00)
GFR, Estimated: >60 mL/min (ref >60)
Glucose, Bld: 121 mg/dL — ABNORMAL HIGH (ref 70–99)
Potassium: 3 mmol/L — ABNORMAL LOW (ref 3.5–5.1)
Sodium: 131 mmol/L — ABNORMAL LOW (ref 135–145)
Total Bilirubin: 0.9 mg/dL (ref 0.3–1.2)
Total Protein: 6.9 g/dL (ref 6.5–8.1)

## 2022-05-20 LAB — CBG MONITORING, ED: Glucose-Capillary: 117 mg/dL — ABNORMAL HIGH (ref 70–99)

## 2022-05-20 LAB — TROPONIN I (HIGH SENSITIVITY)
Troponin I (High Sensitivity): 10 ng/L (ref <18)
Troponin I (High Sensitivity): 10 ng/L (ref <18)

## 2022-05-20 LAB — MAGNESIUM: Magnesium: 1.5 mg/dL — ABNORMAL LOW (ref 1.7–2.4)

## 2022-05-20 MED ORDER — OXYCODONE HCL 5 MG PO TABS
10.0000 mg | ORAL_TABLET | Freq: Once | ORAL | Status: AC
  Administered 2022-05-20: 10 mg via ORAL
  Filled 2022-05-20: qty 2

## 2022-05-20 MED ORDER — MORPHINE SULFATE (PF) 2 MG/ML IV SOLN
2.0000 mg | INTRAVENOUS | Status: DC | PRN
  Administered 2022-05-20: 2 mg via INTRAVENOUS
  Filled 2022-05-20: qty 1

## 2022-05-20 MED ORDER — SODIUM CHLORIDE 0.9 % IV SOLN
INTRAVENOUS | Status: DC
Start: 2022-05-20 — End: 2022-05-23

## 2022-05-20 MED ORDER — NYSTATIN 100000 UNIT/ML MT SUSP
5.0000 mL | Freq: Four times a day (QID) | OROMUCOSAL | Status: DC
  Administered 2022-05-20 – 2022-05-23 (×10): 500000 [IU] via ORAL
  Filled 2022-05-20 (×10): qty 5

## 2022-05-20 MED ORDER — ACETAMINOPHEN 325 MG PO TABS
650.0000 mg | ORAL_TABLET | Freq: Four times a day (QID) | ORAL | Status: DC | PRN
  Administered 2022-05-20 – 2022-05-21 (×2): 650 mg via ORAL
  Filled 2022-05-20 (×2): qty 2

## 2022-05-20 MED ORDER — MAGNESIUM SULFATE 4 GM/100ML IV SOLN
4.0000 g | Freq: Once | INTRAVENOUS | Status: AC
  Administered 2022-05-20: 4 g via INTRAVENOUS
  Filled 2022-05-20: qty 100

## 2022-05-20 MED ORDER — POTASSIUM CHLORIDE 10 MEQ/100ML IV SOLN
10.0000 meq | INTRAVENOUS | Status: AC
  Administered 2022-05-20 (×2): 10 meq via INTRAVENOUS
  Filled 2022-05-20 (×2): qty 100

## 2022-05-20 MED ORDER — ACETAMINOPHEN 650 MG RE SUPP: 650.0000 mg | Freq: Four times a day (QID) | RECTAL | Status: DC | PRN

## 2022-05-20 MED ORDER — POTASSIUM CHLORIDE CRYS ER 20 MEQ PO TBCR
40.0000 meq | EXTENDED_RELEASE_TABLET | ORAL | Status: DC
  Administered 2022-05-20: 40 meq via ORAL
  Filled 2022-05-20: qty 2

## 2022-05-20 MED ORDER — SODIUM CHLORIDE 0.9 % IV BOLUS
1000.0000 mL | Freq: Once | INTRAVENOUS | Status: AC
  Administered 2022-05-20: 1000 mL via INTRAVENOUS

## 2022-05-20 MED ORDER — POTASSIUM CHLORIDE CRYS ER 20 MEQ PO TBCR
40.0000 meq | EXTENDED_RELEASE_TABLET | Freq: Every day | ORAL | Status: DC
  Administered 2022-05-20 – 2022-05-23 (×4): 40 meq via ORAL
  Filled 2022-05-20 (×4): qty 2

## 2022-05-20 MED ORDER — ONDANSETRON HCL 4 MG/2ML IJ SOLN
4.0000 mg | Freq: Once | INTRAMUSCULAR | Status: AC
  Administered 2022-05-20: 4 mg via INTRAVENOUS
  Filled 2022-05-20: qty 2

## 2022-05-20 MED ORDER — OXYCODONE HCL 5 MG PO TABS
10.0000 mg | ORAL_TABLET | Freq: Every day | ORAL | Status: DC
  Administered 2022-05-21 – 2022-05-23 (×3): 10 mg via ORAL
  Filled 2022-05-20 (×3): qty 2

## 2022-05-20 MED ORDER — MAGNESIUM OXIDE -MG SUPPLEMENT 400 (240 MG) MG PO TABS
400.0000 mg | ORAL_TABLET | Freq: Two times a day (BID) | ORAL | Status: DC
  Administered 2022-05-20 – 2022-05-23 (×6): 400 mg via ORAL
  Filled 2022-05-20 (×6): qty 1

## 2022-05-20 MED ORDER — PROCHLORPERAZINE EDISYLATE 10 MG/2ML IJ SOLN: 10.0000 mg | Freq: Four times a day (QID) | INTRAMUSCULAR | Status: DC | PRN

## 2022-05-20 MED ORDER — OXYCODONE HCL 10 MG PO TABS: 10.0000 mg | ORAL_TABLET | ORAL | Status: DC

## 2022-05-20 MED ORDER — OXYCODONE HCL 5 MG PO TABS
20.0000 mg | ORAL_TABLET | ORAL | Status: DC
  Administered 2022-05-20 – 2022-05-23 (×6): 20 mg via ORAL
  Filled 2022-05-20 (×6): qty 4

## 2022-05-20 NOTE — ED Provider Notes (Signed)
I provided a substantive portion of the care of this patient.  I personally performed the entirety of the medical decision making for this encounter.  EKG Interpretation  Date/Time:  Wednesday May 20 2022 08:14:12 EDT Ventricular Rate:  94 PR Interval:  197 QRS Duration: 105 QT Interval:  358 QTC Calculation: 448 R Axis:   -80 Text Interpretation: Sinus rhythm LVH with secondary repolarization abnormality Anterior infarct, old Probable RV involvement, suggest recording right precordial leads Confirmed by Lacretia Leigh ([id number]) on 05/20/2022 1:31:52 PM   Patient presents complaining of weakness began yesterday.  Has been having trouble walking and going to the bathroom.  Brain CT shows that she has new signs of metastatic disease.  Her EKG per my interpretation shows no ischemic findings.  Plan will be to consult oncology and likely admit to medicine   Lacretia Leigh, MD 05/20/22 1332

## 2022-05-20 NOTE — Progress Notes (Signed)
Pt refusing CPAP at this time.

## 2022-05-20 NOTE — ED Provider Notes (Signed)
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 female. Patient presents with generalized weakness that has been worsening over the past 2 weeks.  She has stage IV ovarian cancer that she follows with [facility].  She has had her most recent radiation treatment about 2 weeks ago and started having nausea and vomiting following this.  Nausea and vomiting continued over the past week or 2 but is actually getting better.  Patient expresses a poor appetite and decreased p.o. intake.  She also has chronic pain to her spine from mets, but this seems to be worsening.  She feels she is getting weaker generally, and having difficulty ambulating. She denies any chest pain, shortness of breath, abdominal pain, numbness, gait abnormalities.   Weakness Associated symptoms: nausea and vomiting   Associated symptoms: no abdominal pain, no chest pain, no dizziness, no dysuria, no fever and no shortness of breath        Home Medications Prior to Admission medications   Medication Sig Start Date End Date Taking? Authorizing Provider  acetaminophen (TYLENOL) 500 MG tablet Take 500 mg by mouth every 6 (six) hours as needed.    [provider]  aspirin EC 81 MG tablet Take 1 tablet (81 mg total) by mouth daily. Swallow whole. 10/02/21 09/27/22  Tolia, Sunit, DO  atorvastatin (LIPITOR) 20 MG tablet TAKE 1 TABLET BY MOUTH EVERYDAY AT BEDTIME 03/31/22   Tolia, Sunit, DO  CALCIUM PO Take 1 tablet by mouth 3 (three) times daily with meals.    [provider]  Cholecalciferol (VITAMIN D3 PO) Take 1 tablet by mouth daily.    [provider]  cyanocobalamin 1000 MCG tablet Take by mouth.    [provider]  DULoxetine (CYMBALTA) 30 MG capsule TAKE 1  CAPSULE BY MOUTH EVERY DAY 05/10/22   Janith Lima, MD      Allergies    Patient has no known allergies.    Review of Systems   Review of Systems  Constitutional:  Positive for appetite change and fatigue. Negative for chills and fever.  Respiratory:  Negative for shortness of breath.   Cardiovascular:  Negative for chest pain and leg swelling.  Gastrointestinal:  Positive for nausea and vomiting. Negative for abdominal pain.  Genitourinary:  Negative for dysuria, flank pain and hematuria.  Musculoskeletal:  Positive for back pain and neck pain.  Neurological:  Positive for weakness. Negative for dizziness, syncope and numbness.  All other systems reviewed and are negative.   Physical Exam Updated Vital Signs BP (!) 157/81   Pulse 90   Temp 97.9 F (36.6 C) (Oral)   Resp 20   Ht _0  (1.651 m)   Wt 63.5 kg   SpO2 97%   BMI 23.30 kg/m  Physical Exam Vitals and nursing note reviewed.  Constitutional:      General: She is not in acute distress.    Appearance: Normal appearance. She is not ill-appearing, toxic-appearing or diaphoretic.  HENT:     Head: Normocephalic and atraumatic.     Nose: No nasal deformity.     Mouth/Throat:     Lips: Pink. No lesions.     Mouth: Mucous membranes are moist. No injury, lacerations, oral lesions or  angioedema.     Pharynx: Oropharynx is clear. Uvula midline. No pharyngeal swelling, oropharyngeal exudate, posterior oropharyngeal erythema or uvula swelling.  Eyes:     General: Gaze aligned appropriately. No scleral icterus.       Right eye: No discharge.        Left eye: No discharge.     Conjunctiva/sclera: Conjunctivae normal.     Right eye: Right conjunctiva is not injected. No exudate or hemorrhage.    Left eye: Left conjunctiva is not injected. No exudate or hemorrhage. Cardiovascular:     Rate and Rhythm: Normal rate and regular rhythm.     Pulses: Normal pulses.          Radial pulses are 2+ on the right side and 2+ on the  left side.       Dorsalis pedis pulses are 2+ on the right side and 2+ on the left side.     Heart sounds: Normal heart sounds, S1 normal and S2 normal. Heart sounds not distant. No murmur heard.    No friction rub. No gallop. No S3 or S4 sounds.  Pulmonary:     Effort: Pulmonary effort is normal. No accessory muscle usage or respiratory distress.     Breath sounds: Normal breath sounds. No stridor. No wheezing, rhonchi or rales.  Chest:     Chest wall: No tenderness.  Abdominal:     General: Abdomen is flat. There is no distension.     Palpations: Abdomen is soft. There is no mass or pulsatile mass.     Tenderness: There is no abdominal tenderness. There is no right CVA tenderness, left CVA tenderness, guarding or rebound.     Hernia: No hernia is present.  Musculoskeletal:     Right lower leg: No edema.     Left lower leg: No edema.  Skin:    General: Skin is warm and dry.     Coloration: Skin is not jaundiced or pale.     Findings: No bruising, erythema, lesion or rash.  Neurological:     General: No focal deficit present.     Mental Status: She is alert and oriented to person, place, and time.     GCS: GCS eye subscore is 4. GCS verbal subscore is 5. GCS motor subscore is 6.     Comments: 4/5 motor strength in bilateral upper and lower extremities No sensory deficits  Psychiatric:        Mood and Affect: Mood normal.        Behavior: Behavior normal. Behavior is cooperative.     ED Results / Procedures / Treatments   Labs (all labs ordered are listed, but only abnormal results are displayed) Labs Reviewed  COMPREHENSIVE METABOLIC PANEL - Abnormal; Notable for the following components:      Result Value   Sodium 131 (*)    Potassium 3.0 (*)    Chloride 90 (*)    Glucose, Bld 121 (*)    Calcium 8.8 (*)    Albumin 3.4 (*)    Alkaline Phosphatase 141 (*)    All other components within normal limits  CBC WITH DIFFERENTIAL/PLATELET - Abnormal; Notable for the following  components:   WBC 10.9 (*)    Neutro Abs 9.0 (*)    Lymphs Abs 0.5 (*)    Monocytes Absolute 1.3 (*)    All other components within normal limits  URINALYSIS, ROUTINE W REFLEX MICROSCOPIC - Abnormal; Notable for the following components:   Hgb urine dipstick  SMALL (*)    Ketones, ur 5 (*)    Leukocytes,Ua SMALL (*)    Bacteria, UA RARE (*)    All other components within normal limits  MAGNESIUM - Abnormal; Notable for the following components:   Magnesium 1.5 (*)    All other components within normal limits  CBG MONITORING, ED - Abnormal; Notable for the following components:   Glucose-Capillary 117 (*)    All other components within normal limits  TROPONIN I (HIGH SENSITIVITY)  TROPONIN I (HIGH SENSITIVITY)    EKG EKG Interpretation  Date/Time:  Wednesday May 20 2022 08:14:12 EDT Ventricular Rate:  94 PR Interval:  197 QRS Duration: 105 QT Interval:  358 QTC Calculation: 448 R Axis:   -80 Text Interpretation: Sinus rhythm LVH with secondary repolarization abnormality Anterior infarct, old Probable RV involvement, suggest recording right precordial leads Confirmed by Lacretia Leigh ([id number]) on 05/20/2022 1:31:52 PM  Radiology CT Head Wo Contrast  Result Date: 05/20/2022 CLINICAL DATA:  Mental status change, unknown cause EXAM: CT HEAD WITHOUT CONTRAST TECHNIQUE: Contiguous axial images were obtained from the base of the skull through the vertex without intravenous contrast. RADIATION DOSE REDUCTION: This exam was performed according to the departmental dose-optimization program which includes automated exposure control, adjustment of the mA and/or kV according to patient size and/or use of iterative reconstruction technique. COMPARISON:  MRI head January 19, 2021 FINDINGS: Brain: No evidence of acute infarction, hemorrhage, hydrocephalus, extra-axial collection or mass lesion/mass effect. Cerebral atrophy. Mild patchy white matter hypodensities, nonspecific but compatible with  chronic microvascular ischemic disease. Vascular: Calcific intracranial atherosclerosis. Skull: Erosive/destructive left parietal calvarial lesion with extension into the overlying soft tissues (for example see series 2 and 3, image 24). Sinuses/Orbits: Completely opacified left maxillary sinus. No acute orbital findings. Other: No mastoid effusions. IMPRESSION: 1. Erosive/destructive left parietal calvarial lesion with extension into the overlying extracranial soft tissues, concerning for malignancy and likely metastatic disease. Recommend MRI with contrast to further evaluate this lesion also to exclude intracranial metastatic disease. 2. Completely opacified left maxillary sinus. Electronically Signed   By: Margaretha Sheffield M.D.   On: 05/20/2022 12:20   DG Chest Portable 1 View  Result Date: 05/20/2022 CLINICAL DATA:  Weakness. EXAM: PORTABLE CHEST 1 VIEW COMPARISON:  July 21, 2020. FINDINGS: The heart size and mediastinal contours are within normal limits. Left lung is clear. Minimal right basilar subsegmental atelectasis or scarring is noted. Expansile destructive lesion is seen involving the lateral portion of the right sixth rib concerning for metastatic disease. IMPRESSION: Expansile destructive lesion seen involving lateral portion of right sixth rib concerning for metastatic disease. CT scan of the chest is recommended for further evaluation. Minimal right basilar subsegmental atelectasis or scarring is noted. Electronically Signed   By: Marijo Conception M.D.   On: 05/20/2022 08:47    Procedures Procedures  This patient was on telemetry or cardiac monitoring during their time in the ED.    Medications Ordered in ED Medications  sodium chloride 0.9 % bolus 1,000 mL (0 mLs Intravenous Stopped 05/20/22 1125)  ondansetron (ZOFRAN) injection 4 mg (4 mg Intravenous Given 05/20/22 0856)  potassium chloride 10 mEq in 100 mL IVPB (0 mEq Intravenous Stopped 05/20/22 1225)  magnesium sulfate IVPB 4  g 100 mL (0 g Intravenous Stopped 05/20/22 1227)  oxyCODONE (Oxy IR/ROXICODONE) immediate release tablet 10 mg (10 mg Oral Given 05/20/22 1214)    ED Course/ Medical Decision Making/ A&P  Medical Decision Making Amount and/or Complexity of Data Reviewed Labs: ordered. Radiology: ordered.  Risk Prescription drug management.    MDM  This is a 86 y.o. female who presents to the ED with generalized weakness The differential of this patient includes but is not limited to Anemia, Cardiac (ischemia, arrhythmia, valvular disease), Dehydration, electrolyte abnormality, hypothyroidism, Hyperthyroidism, Infection, Psych, Medication/Toxins  Initial Impression  Well appearing. Vitals stable. Afebrile. Exam overall unremarkable.  Low suspicion for cardiac etiology. No focal neurological deficits. Will defer MRI spine for now. Suspect this is functional decline due to cancer treatments, pain, and poor PO intake.  Will give IVF. Zofran.   I personally ordered, reviewed, and interpreted all laboratory work and imaging and agree with radiologist interpretation. Results interpreted below: CBC: wbc 10.9 CMP: Na 131, K 3.0, Cl 90, alk phos 141 (likely from bone mets) Mag 1.5 Troponin negative x 2 UA not consistent with UTI CXR concerning for worsening mets to rib cage  Assessment/Plan:  - Electrolyte abnormalities: replacing potassium and Magnesium in ED  _0  I was notified by RN that patient is starting to get more and more confused throughout time here. This was first noticed yesterday by patient's sister where there were times where patient wasn't making much sense. This seems to wax and wane. Patient does live alone, so this is a concern with her gong back home. Will add on CT head. Confusion could be related to electrolyte abnormality, worsening mets, versus increase in oxycodone over the past several days.  _1 , CT head obtained and it is concerning for  erosive/destructive left parietal calvarial lesion with extension into the overlying extracranial soft tissues concerning for worsening metastatic disease.  I have ordered MRI brain to assess if there are actual intracranial mets, but this could explain the patient's worsening confusion.  Plan to admit to hospitalist service after discussion with my attending. Patient has previously seen [facility], but are currently done with treatments and pursuing palliative care, yet are unable to see them outpatient until the end of August. She meets admission criteria for worsening altered mental status, new brain mets, and new progressive weakness affecting ambulation. She is a high fall risk and is currently living alone.   _2 , Discussed case with Dr. Marylyn Ishihara from [place] who will evaluate patient for admission    Charting Requirements Additional history is obtained from:  Independent historian External Records from outside source obtained and reviewed including: Prior Onc notes from [facility], prior labs Social Determinants of Health:  none Pertinant PMH that complicates patient's illness: Stage 4 metastatic ovarian cancer  Patient Care Problems that were addressed during this visit: - Generalized Weakness: Acute illness with complication - Altered Mental Status: Acute illness with systemic symptoms - Hypokalemia: Acute illness - Hypomagnesemia: Acute illness - Hyponatremia: Acute illness This patient was maintained on a cardiac monitor/telemetry. I personally viewed and interpreted the cardiac monitor which reveals an underlying rhythm of NSR Medications given in ED: Mag Sulfate, Kcl, IVF, zofran Reevaluation of the patient after these medicines showed that the patient stayed the same I have reviewed home medications and made changes accordingly.  Critical Care Interventions: n/a Consultations: triad hospitalist Disposition: admit  This is a shared visit with my attending physician, Dr.  Zenia Resides.  We have discussed this patient and they have independently evaluated this patient. The plan was altered or changed as needed.  Portions of this note were generated with [employer]. Dictation errors may occur despite best attempts at proofreading.  Final Clinical Impression(s) / ED Diagnoses Final diagnoses:  Generalized weakness  Altered mental status, unspecified altered mental status type  Hypokalemia  Hypomagnesemia  Hyponatremia    Rx / DC Orders ED Discharge Orders     None         Adolphus Birchwood, PA-C 05/20/22 1347    Lacretia Leigh, MD 05/20/22 1552

## 2022-05-20 NOTE — ED Notes (Signed)
Patient transported to floor at this time.  All personal belongings with patient

## 2022-05-20 NOTE — H&P (Signed)
History and Physical    Patient: Maria [name] TIW:[id number] DOB: 1936-09-18 DOA: 05/20/2022 DOS: the patient was seen and examined on 05/20/2022 PCP: Charlane Ferretti, MD  Patient coming from: Home  Chief Complaint:  Chief Complaint  Patient presents with   Weakness   HPI: Maria [name] is a 86 y.o. female with medical history significant of S4 metastatic ovarian CA, HLD, chronic pain, OSA on cpap, hereditary hemochromatosis, CAD. Presenting with N/V, poor appetite, and weakness. She receives her onco care through [facility]. She is aware that is in incurable. She had palliative radiotherapy a few weeks ago. Since that time, she has had N/V and poor appetite. She has become increasingly weak. Her family was concerned that she was showing intermittent periods of confusion. Given her constellation of symptoms, her family be came concerned and brought her to the ED for evaluation. They deny any other aggravating or alleviating factors.   Review of Systems: As mentioned in the history of present illness. All other systems reviewed and are negative. Past Medical History:  Diagnosis Date   Cataract    Complication of anesthesia    slow to awaken   Depression    Diverticulosis    Goiter, nodular    [place] of thyroid stable 7/08   Headache    hx of   Heart murmur    due to rheumatic fever as child   Hemochromatosis, hereditary ([place]) 09/01/2019   Mitral valve prolapse    Multiple thyroid nodules 11/24/2015   Neoplasm of lung, malignant ([place]) 2002   Non-small cell surgical tx   Osteoarthritis 11/14/2013   2010 intra-articular steroids X 3 S/P Physical Therapy in 12/14 TKR 12/11/13    Osteopenia    Dr Pamala Hurry   PONV (postoperative nausea and vomiting)    n/v   Sleep apnea    Vitamin D deficiency    Past Surgical History:  Procedure Laterality Date   ABDOMINAL HYSTERECTOMY  1998   No BSO, for dysfunctional menses   BLADDER SUSPENSION Bilateral    BREAST BIOPSY Right    COLONOSCOPY   2009 , 2014   Diverticulosis; Dr. Olevia Perches   LOBECTOMY  12/02   RU; no radiation or chemo, Dr. Arlyce Dice   TONSILLECTOMY     TOTAL KNEE ARTHROPLASTY Right 12/11/2013   Procedure: RIGHT TOTAL KNEE ARTHROPLASTY;  Surgeon: Gearlean Alf, MD;  Location: [facility];  Service: Orthopedics;  Laterality: Right;   TOTAL KNEE ARTHROPLASTY Left 06/28/2017   Procedure: LEFT TOTAL KNEE ARTHROPLASTY;  Surgeon: Gaynelle Arabian, MD;  Location: [facility];  Service: Orthopedics;  Laterality: Left;  Adductor Block   Social History:  reports that she quit smoking about 43 years ago. Her smoking use included cigarettes. She has a 5.00 pack-year smoking history. She has never used smokeless tobacco. She reports current alcohol use of about 7.0 standard drinks of alcohol per week. She reports that she does not use drugs.  No Known Allergies  Family History  Problem Relation Age of Onset   COPD Mother    Coronary artery disease Father    Colon cancer Father 48   Heart attack Father 76   Stroke Father        > 30   Lung cancer Paternal Aunt        smoker   Cancer Paternal Aunt        lung   Lung cancer Maternal Grandfather        smoker   COPD Maternal Grandfather  Arthritis Sister    Lung cancer Maternal Uncle        smoker   Diabetes Neg Hx     Prior to Admission medications   Medication Sig Start Date End Date Taking? Authorizing Provider  nystatin (MYCOSTATIN) 100000 UNIT/ML suspension Take 5 mLs by mouth 4 (four) times daily. Swish and swallow 05/16/22  Yes [provider]  Oxycodone HCl 10 MG TABS Take 10-20 mg by mouth See admin instructions. Take 2 tablets (20 mg) by mouth every morning and night; take 1 tablet (10 mg) midday 04/29/22  Yes [provider]  atorvastatin (LIPITOR) 20 MG tablet TAKE 1 TABLET BY MOUTH EVERYDAY AT BEDTIME Patient not taking: Reported on 05/20/2022 03/31/22   Terri Skains, Sunit, DO  DULoxetine (CYMBALTA) 30 MG capsule TAKE 1 CAPSULE BY MOUTH EVERY DAY Patient not  taking: Reported on 05/20/2022 05/10/22   Janith Lima, MD    Physical Exam: Vitals:   05/20/22 1315 05/20/22 1330 05/20/22 1345 05/20/22 1400  BP:  (!) 118/107  (!) 118/104  Pulse: 90 91 90 91  Resp: (!) $RemoveB'25 15 13 19  'nLXMtYOY$ Temp:      TempSrc:      SpO2: 98% 96% 95% 93%  Weight:      Height:       General: 86 y.o. female resting in bed in NAD Eyes: PERRL, normal sclera ENMT: Nares patent w/o discharge, orophaynx clear, dentition normal, ears w/o discharge/lesions/ulcers Neck: Supple, trachea midline Cardiovascular: RRR, +S1, S2, no m/g/r, equal pulses throughout Respiratory: CTABL, no w/r/r, normal WOB GI: BS+, NDNT, no masses noted, no organomegaly noted MSK: No e/c/c Neuro: A&O x 3, no focal deficits Psyc: Appropriate interaction and affect, calm/cooperative  Data Reviewed:    Lab Results  Component Value Date   NA 131 (L) 05/20/2022   K 3.0 (L) 05/20/2022   CO2 30 05/20/2022   GLUCOSE 121 (H) 05/20/2022   BUN 10 05/20/2022   CREATININE 0.55 05/20/2022   CALCIUM 8.8 (L) 05/20/2022   EGFR 76 11/13/2021   GFRNONAA >60 05/20/2022  Mg2+  1.5  Lab Results  Component Value Date   WBC 10.9 (H) 05/20/2022   HGB 14.8 05/20/2022   HCT 44.3 05/20/2022   MCV 92.3 05/20/2022   PLT 211 05/20/2022   CTH 1. Erosive/destructive left parietal calvarial lesion with extension into the overlying extracranial soft tissues, concerning for malignancy and likely metastatic disease. Recommend MRI with contrast to further evaluate this lesion also to exclude intracranial metastatic disease. 2. Completely opacified left maxillary sinus.  CXR Expansile destructive lesion seen involving lateral portion of right sixth rib concerning for metastatic disease. CT scan of the chest is recommended for further evaluation.   Minimal right basilar subsegmental atelectasis or scarring is noted.  Assessment and Plan: Generalized weakness N/V FTT     - place in obs     - weakness secondary to  CA tx and poor PO intake     - she is looking for outpt hospice at this point; no need to consult PT/OT now     - anti-emetics     - diet as tolerated  Hypokalemia Hypomagnesemia Hyponatremia     - she would like to have her lytes addressed; add PO K+ at this time; she was given 2g IV Mg2+     - fluids  Stage 4 ovarian cancer w/ spinal mets New erosive brain lesion New right rib erosive lesion concerning for mets     - had  conversation about her cancer and new lesions noted on imaging     - she is aware that her condition is incurable and she is not looking for any more cancer treatments     - I discussed imaging to clarify the current new lesions, she says if she isn't going to get treatment for any of it, there is no point in the imaging; and has thus, declined further imaging/workup     - her status is DNR, she is requesting palliative/hospice consultation for hospice house arrangements as she lives alone and is too weak to care for herself     - palliative consult placed  OSA     - CPAP qHS  Advance Care Planning:   Code Status: DNR  Consults: Palliative  Family Communication: w/ sister at bedside  Severity of Illness: The appropriate patient status for this patient is OBSERVATION. Observation status is judged to be reasonable and necessary in order to provide the required intensity of service to ensure the patient's safety. The patient's presenting symptoms, physical exam findings, and initial radiographic and laboratory data in the context of their medical condition is felt to place them at decreased risk for further clinical deterioration. Furthermore, it is anticipated that the patient will be medically stable for discharge from the hospital within 2 midnights of admission.   Author: Jonnie Finner, DO 05/20/2022 2:17 PM  For on call review [email or web address].

## 2022-05-20 NOTE — ED Triage Notes (Signed)
Pt bib ems from home for weakness starting yesterday.  Pt states she is unable to walk to the bathroom or get around at home.  Pt denies falls, loc or any pain at present. Pt's CBG  per ems.

## 2022-05-21 DIAGNOSIS — Z85118 Personal history of other malignant neoplasm of bronchus and lung: Secondary | ICD-10-CM | POA: Diagnosis not present

## 2022-05-21 DIAGNOSIS — G893 Neoplasm related pain (acute) (chronic): Secondary | ICD-10-CM | POA: Diagnosis present

## 2022-05-21 DIAGNOSIS — G939 Disorder of brain, unspecified: Secondary | ICD-10-CM | POA: Diagnosis present

## 2022-05-21 DIAGNOSIS — L8990 Pressure ulcer of unspecified site, unspecified stage: Secondary | ICD-10-CM | POA: Insufficient documentation

## 2022-05-21 DIAGNOSIS — Z96653 Presence of artificial knee joint, bilateral: Secondary | ICD-10-CM | POA: Diagnosis present

## 2022-05-21 DIAGNOSIS — Z87891 Personal history of nicotine dependence: Secondary | ICD-10-CM | POA: Diagnosis not present

## 2022-05-21 DIAGNOSIS — Z7189 Other specified counseling: Secondary | ICD-10-CM | POA: Diagnosis not present

## 2022-05-21 DIAGNOSIS — R627 Adult failure to thrive: Secondary | ICD-10-CM | POA: Diagnosis present

## 2022-05-21 DIAGNOSIS — F32A Depression, unspecified: Secondary | ICD-10-CM | POA: Diagnosis present

## 2022-05-21 DIAGNOSIS — E785 Hyperlipidemia, unspecified: Secondary | ICD-10-CM | POA: Diagnosis present

## 2022-05-21 DIAGNOSIS — C561 Malignant neoplasm of right ovary: Secondary | ICD-10-CM | POA: Diagnosis present

## 2022-05-21 DIAGNOSIS — Z515 Encounter for palliative care: Secondary | ICD-10-CM | POA: Diagnosis not present

## 2022-05-21 DIAGNOSIS — C7951 Secondary malignant neoplasm of bone: Secondary | ICD-10-CM | POA: Diagnosis present

## 2022-05-21 DIAGNOSIS — I341 Nonrheumatic mitral (valve) prolapse: Secondary | ICD-10-CM | POA: Diagnosis present

## 2022-05-21 DIAGNOSIS — Z801 Family history of malignant neoplasm of trachea, bronchus and lung: Secondary | ICD-10-CM | POA: Diagnosis not present

## 2022-05-21 DIAGNOSIS — E871 Hypo-osmolality and hyponatremia: Secondary | ICD-10-CM | POA: Diagnosis present

## 2022-05-21 DIAGNOSIS — R531 Weakness: Secondary | ICD-10-CM | POA: Diagnosis not present

## 2022-05-21 DIAGNOSIS — L89152 Pressure ulcer of sacral region, stage 2: Secondary | ICD-10-CM | POA: Diagnosis present

## 2022-05-21 DIAGNOSIS — G4733 Obstructive sleep apnea (adult) (pediatric): Secondary | ICD-10-CM | POA: Diagnosis present

## 2022-05-21 DIAGNOSIS — Z66 Do not resuscitate: Secondary | ICD-10-CM | POA: Diagnosis present

## 2022-05-21 DIAGNOSIS — C7931 Secondary malignant neoplasm of brain: Secondary | ICD-10-CM | POA: Diagnosis present

## 2022-05-21 DIAGNOSIS — R112 Nausea with vomiting, unspecified: Secondary | ICD-10-CM | POA: Diagnosis present

## 2022-05-21 DIAGNOSIS — I2510 Atherosclerotic heart disease of native coronary artery without angina pectoris: Secondary | ICD-10-CM | POA: Diagnosis present

## 2022-05-21 DIAGNOSIS — E876 Hypokalemia: Secondary | ICD-10-CM | POA: Diagnosis present

## 2022-05-21 DIAGNOSIS — Z8249 Family history of ischemic heart disease and other diseases of the circulatory system: Secondary | ICD-10-CM | POA: Diagnosis not present

## 2022-05-21 LAB — RENAL FUNCTION PANEL
Albumin: 2.9 g/dL — ABNORMAL LOW (ref 3.5–5.0)
Anion gap: 8 (ref 5–15)
BUN: 9 mg/dL (ref 8–23)
CO2: 28 mmol/L (ref 22–32)
Calcium: 8.5 mg/dL — ABNORMAL LOW (ref 8.9–10.3)
Chloride: 101 mmol/L (ref 98–111)
Creatinine, Ser: 0.5 mg/dL (ref 0.44–1.00)
GFR, Estimated: >60 mL/min (ref >60)
Glucose, Bld: 100 mg/dL — ABNORMAL HIGH (ref 70–99)
Phosphorus: 3.4 mg/dL (ref 2.5–4.6)
Potassium: 4.4 mmol/L (ref 3.5–5.1)
Sodium: 137 mmol/L (ref 135–145)

## 2022-05-21 LAB — MAGNESIUM: Magnesium: 1.8 mg/dL (ref 1.7–2.4)

## 2022-05-21 MED ORDER — POLYETHYLENE GLYCOL 3350 17 G PO PACK
17.0000 g | PACK | Freq: Every day | ORAL | Status: DC
  Administered 2022-05-22 – 2022-05-23 (×2): 17 g via ORAL
  Filled 2022-05-21 (×3): qty 1

## 2022-05-21 MED ORDER — SENNOSIDES-DOCUSATE SODIUM 8.6-50 MG PO TABS
1.0000 | ORAL_TABLET | Freq: Two times a day (BID) | ORAL | Status: DC
  Administered 2022-05-21 – 2022-05-23 (×4): 1 via ORAL
  Filled 2022-05-21 (×4): qty 1

## 2022-05-21 NOTE — Consult Note (Signed)
Consultation Note Date: 05/21/2022   Patient Name: Maria [name]  DOB: 09/29/1936  MRN: [id number]  Age / Sex: 86 y.o., female  PCP: Charlane Ferretti, MD Referring Physician: Florencia Reasons, MD  Reason for Consultation: Establishing goals of care  HPI/Patient Profile: 86 y.o. female  with past medical history of Metastatic ovarian cancer, OSA on cpap, hereditary hemochromatosis, CAD, and hld admitted on 05/20/2022 with nausea, vomiting, poor p.o. intake, and weakness.  Also experiencing intermittent confusion.  Patient found to have new erosive brain lesion and rib lesion.  PMT consulted for goals of care discussions.  Clinical Assessment and Goals of Care: I have reviewed medical records including EPIC notes, labs and imaging, assessed the patient and then met with patient, son, and grandson to discuss diagnosis prognosis, GOC, EOL wishes, disposition and options.  I introduced Palliative Medicine as specialized medical care for people living with serious illness. It focuses on providing relief from the symptoms and stress of a serious illness. The goal is to improve quality of life for both the patient and the family.  Patient shares about her recent decline in functional status -essentially nonambulatory.  She also shares a recent weight loss and poor appetite.  She tells me of increased confusion at night.  She tells me symptoms are currently well controlled with current pain regimen.   We discussed patient's current illness and what it means in the larger context of patient's on-going co-morbidities.  Natural disease trajectory and expectations at EOL were discussed.  Patient understands her disease is incurable.  I attempted to elicit values and goals of care important to the patient.  Patient expresses her sense of peace understanding she is nearing end-of-life.  The difference between aggressive medical intervention and comfort care was  considered in light of the patient's goals of care.  Patient is not interested in pursuing any further aggressive medical interventions and would like to focus solely on her comfort.  Hospice services outpatient were explained and offered.  We discussed arranging hospice care at home versus hospice facility.  Patient and family have a lot of experience with [facility] and are interested in this option.  We discussed her current need for caregivers as well as aggressive symptom management to ensure her comfort.  Patient request time to discuss with other family members and her friends about the options of going home with hospice support versus hospice facility.  She tells me she will follow-up with me this afternoon or tomorrow.  Questions and concerns were addressed. The family was encouraged to call with questions or concerns.  Primary Decision Maker PATIENT    SUMMARY OF RECOMMENDATIONS   Patient interested in hospice support, and further aggressive medical interventions -patient family considering hospice at home versus hospice facility -PMT will follow up  Code Status/Advance Care Planning: DNR   Symptom Management:  No changes - patient reports she feels well on current regimen     Primary Diagnoses: Present on Admission:  Malignant neoplasm of right ovary (HCC)  OSA (obstructive sleep apnea)   I have reviewed the medical record, interviewed the patient and family, and examined the patient. The following aspects are pertinent.  Past Medical History:  Diagnosis Date   Cataract    Complication of anesthesia    slow to awaken   Depression    Diverticulosis    Goiter, nodular    [place] of thyroid stable 7/08   Headache    hx of   Heart murmur  due to rheumatic fever as child   Hemochromatosis, hereditary ([place]) 09/01/2019   Mitral valve prolapse    Multiple thyroid nodules 11/24/2015   Neoplasm of lung, malignant ([place]) 2002   Non-small cell surgical tx   Osteoarthritis  11/14/2013   2010 intra-articular steroids X 3 S/P Physical Therapy in 12/14 TKR 12/11/13    Osteopenia    Dr Pamala Hurry   PONV (postoperative nausea and vomiting)    n/v   Sleep apnea    Vitamin D deficiency    Social History   Socioeconomic History   Marital status: Divorced    Spouse name: Not on file   Number of children: Not on file   Years of education: college   Highest education level: Not on file  Occupational History   Occupation: Retired    Fish farm manager: RETIRED  Tobacco Use   Smoking status: Former    Packs/day: 0.25    Years: 20.00    Total pack years: 5.00    Types: Cigarettes    Quit date: 05/03/1979    Years since quitting: 43.0   Smokeless tobacco: Never   Tobacco comments:    smoked 1962-1980, up to < 1 ppd  Vaping Use   Vaping Use: Never used  Substance and Sexual Activity   Alcohol use: Yes    Alcohol/week: 7.0 standard drinks of alcohol    Types: 7 Standard drinks or equivalent per week    Comment: occasional   Drug use: No   Sexual activity: Not Currently    Comment: lives alone, dog, no dietary restrictions, eating heart healthy  Other Topics Concern   Not on file  Social History Narrative   Pt gets regular exercise.   Drinks 2 cups of coffee a day    Social Determinants of Health   Financial Resource Strain: Low Risk  (03/25/2020)   Overall Financial Resource Strain (CARDIA)    Difficulty of Paying Living Expenses: Not hard at all  Food Insecurity: No Food Insecurity (03/25/2020)   Hunger Vital Sign    Worried About Running Out of Food in the Last Year: Never true    Ran Out of Food in the Last Year: Never true  Transportation Needs: No Transportation Needs (03/25/2020)   PRAPARE - Hydrologist (Medical): No    Lack of Transportation (Non-Medical): No  Physical Activity: Not on file  Stress: Not on file  Social Connections: Not on file   Family History  Problem Relation Age of Onset   COPD Mother    Coronary  artery disease Father    Colon cancer Father 32   Heart attack Father 35   Stroke Father        > 21   Lung cancer Paternal Aunt        smoker   Cancer Paternal Aunt        lung   Lung cancer Maternal Grandfather        smoker   COPD Maternal Grandfather    Arthritis Sister    Lung cancer Maternal Uncle        smoker   Diabetes Neg Hx    Scheduled Meds:  magnesium oxide  400 mg Oral BID   nystatin  5 mL Oral QID   oxyCODONE  20 mg Oral 2 times per day   And   oxyCODONE  10 mg Oral Q1400   potassium chloride  40 mEq Oral Daily   Continuous Infusions:  sodium chloride 100  mL/hr at 05/20/22 2050   PRN Meds:.acetaminophen **OR** acetaminophen, morphine injection, prochlorperazine No Known Allergies Review of Systems  Constitutional:  Positive for activity change and appetite change.    Physical Exam Constitutional:      General: She is not in acute distress. Pulmonary:     Effort: Pulmonary effort is normal.  Skin:    General: Skin is warm and dry.  Neurological:     Mental Status: She is alert and oriented to person, place, and time.  Psychiatric:        Mood and Affect: Mood normal.        Behavior: Behavior normal.     Vital Signs: BP 130/76 (BP Location: Right Arm)   Pulse 78   Temp 98.1 F (36.7 C)   Resp 17   Ht _0  (1.651 m)   Wt 64.3 kg   SpO2 100%   BMI 23.60 kg/m  Pain Scale: 0-10 POSS *See Group Information*: S-Acceptable,Sleep, easy to arouse Pain Score: 8    SpO2: SpO2: 100 % O2 Device:SpO2: 100 % O2 Flow Rate: .O2 Flow Rate (L/min): 2 L/min  IO: Intake/output summary:  Intake/Output Summary (Last 24 hours) at 05/21/2022 1257 Last data filed at 05/21/2022 1152 Gross per 24 hour  Intake 1561.89 ml  Output 550 ml  Net 1011.89 ml    LBM: Last BM Date : 05/19/22 Baseline Weight: Weight: 63.5 kg Most recent weight: Weight: 64.3 kg     Palliative Assessment/Data:PPS 20%    *Please note that this is a verbal dictation therefore any  spelling or grammatical errors are due to the "[employer]" system interpretation.  Juel Burrow, DNP, AGNP-C Palliative Medicine Team [phone] Pager: [phone]

## 2022-05-21 NOTE — Progress Notes (Signed)
Nutrition Brief Note  Patient identified on the Malnutrition Screening Tool (MST) Report  Reviewed chart. Pt being followed by Palliative Care. Considering home with hospice versus residential hospice.   Body mass index is 23.6 kg/m.   Current diet order is Regular. Labs and medications reviewed.   Please consult if nutrition needs arise.   Clayborne Dana, RDN, LDN Clinical Nutrition

## 2022-05-21 NOTE — Progress Notes (Signed)
PROGRESS NOTE    Maria [name]  QQI:[id number] DOB: 04/03/1936 DOA: 05/20/2022 PCP: Charlane Ferretti, MD     Brief Narrative:   H/o HLD, CAD, hereditary hemochromatosis, OSA on cpap, stage 4 metastatic ovarian CA,  chronic pain, Presenting with N/V, poor appetite, and weakness, reports getting significantly weak after radiation therapy  Subjective:  Reports feeling weak, pain is controlled at rest  Assessment & Plan:  Principal Problem:   Generalized weakness Active Problems:   Hyponatremia   Hypokalemia   OSA (obstructive sleep apnea)   Malignant neoplasm of right ovary (HCC)   Hypomagnesemia   Nausea and vomiting   FTT (failure to thrive) in adult   Brain lesion   Rib lesion   Pressure injury of skin    Assessment and Plan:  FTT/cancer pain in the setting of stage IV ovarian cancer With New erosive calvarial lesion, New right rib erosive lesion concerning for mets She is not interested in aggressive therapy, currently she is discussing options with palliative care and family regarding residential hospice versus home with home hospice Currently appear to be comfortable at rest, continue current pain regimen, add on stool softener Appreciate palliative care input  Hyponatremia/hypokalemia/hypomagnesemia Likely from poor oral intake and  nausea vomiting Received hydration and lytes supplement Improved  Confusion, no confusion this morning, from narcotics? And electrolytes derangement Continue goals of care   OSA, continue CPAP nightly    Skin Assessment: I have examined the patient's skin and I agree with the wound assessment as performed by the wound care RN as outlined below: Pressure Injury 05/20/22 Coccyx Mid Stage 2 -  Partial thickness loss of dermis presenting as a shallow open injury with a red, pink wound bed without slough. slightly abraded 0.5cm oval area within nonblanchable region and blanchable skin (Active)  05/20/22 2100  Location: Coccyx   Location Orientation: Mid  Staging: Stage 2 -  Partial thickness loss of dermis presenting as a shallow open injury with a red, pink wound bed without slough.  Wound Description (Comments): slightly abraded 0.5cm oval area within nonblanchable region and blanchable skin  Present on Admission: Yes  Dressing Type Foam - Lift dressing to assess site every shift 05/21/22 0915     I have Reviewed nursing notes, Vitals, pain scores, I/o's, Lab results and  imaging results since pt's last encounter, details please see discussion above    DVT prophylaxis: scd's   Code Status:   Code Status: DNR  Family Communication: family /friends at bedside with permission  Disposition:    Dispo: The patient is from: home, lives along              Anticipated d/c is to: Home with home hospice versus residential hospice              Anticipated d/c date is: TBD, continue goals of care discussion  Antimicrobials:   Anti-infectives (From admission, onward)    None           Objective: Vitals:   05/20/22 2013 05/20/22 2059 05/21/22 0500 05/21/22 0942  BP:  138/86 102/69 130/76  Pulse:  93 75 78  Resp:  16 18 17   Temp: 99.2 F (37.3 C) 98.4 F (36.9 C) 97.6 F (36.4 C) 98.1 F (36.7 C)  TempSrc: Oral Oral Oral   SpO2:  98% 98% 100%  Weight:  64.3 kg    Height:  5\' 5"  (1.651 m)      Intake/Output Summary (Last 24 hours) at  05/21/2022 1629 Last data filed at 05/21/2022 1152 Gross per 24 hour  Intake 1561.89 ml  Output 550 ml  Net 1011.89 ml   Filed Weights   05/20/22 0803 05/20/22 2059  Weight: 63.5 kg 64.3 kg    Examination:  General exam: alert, awake, communicative,calm, NAD Respiratory system: Clear to auscultation. Respiratory effort normal. Cardiovascular system:  RRR.  Gastrointestinal system: Abdomen is nondistended, soft and nontender.  Normal bowel sounds heard. Central nervous system: Alert and oriented. No focal neurological deficits. Extremities:  no  edema Skin: No rashes, lesions or ulcers Psychiatry: Judgement and insight appear normal. Mood & affect appropriate.     Data Reviewed: I have personally reviewed  labs and visualized  imaging studies since the last encounter and formulate the plan        Scheduled Meds:  magnesium oxide  400 mg Oral BID   nystatin  5 mL Oral QID   oxyCODONE  20 mg Oral 2 times per day   And   oxyCODONE  10 mg Oral Q1400   [START ON 05/22/2022] polyethylene glycol  17 g Oral Daily   potassium chloride  40 mEq Oral Daily   senna-docusate  1 tablet Oral BID   Continuous Infusions:  sodium chloride 100 mL/hr at 05/20/22 2050     LOS: 0 days      Florencia Reasons, MD PhD FACP [facility]  Available via Epic secure chat 7am-7pm for nonurgent issues Please page for urgent issues To page the attending provider between 7A-7P or the covering provider during after hours 7P-7A, please log into the web site [email or web address] and access using universal [place] password for that web site. If you do not have the password, please call the hospital operator.    05/21/2022, 4:29 PM

## 2022-05-21 NOTE — Progress Notes (Signed)
PT states she does not use cpap

## 2022-05-22 DIAGNOSIS — Z7189 Other specified counseling: Secondary | ICD-10-CM | POA: Diagnosis not present

## 2022-05-22 DIAGNOSIS — R627 Adult failure to thrive: Secondary | ICD-10-CM | POA: Diagnosis not present

## 2022-05-22 DIAGNOSIS — G939 Disorder of brain, unspecified: Secondary | ICD-10-CM | POA: Diagnosis not present

## 2022-05-22 DIAGNOSIS — R531 Weakness: Secondary | ICD-10-CM | POA: Diagnosis not present

## 2022-05-22 NOTE — Plan of Care (Signed)
  Problem: Education: Goal: Knowledge of General Education information will improve Description: Including pain rating scale, medication(s)/side effects and non-pharmacologic comfort measures Outcome: Progressing   Problem: Pain Managment: Goal: General experience of comfort will improve Outcome: Progressing   Problem: Safety: Goal: Ability to remain free from injury will improve Outcome: Progressing   

## 2022-05-22 NOTE — Progress Notes (Signed)
Pt refused cpap tonight. She said please dont do it and she feels fine without it. She said last time she wore it was 6-7 weeks ago.

## 2022-05-22 NOTE — Progress Notes (Signed)
Manufacturing engineer [facility])  Referral received for EOL care at [facility].  Met with patient and several of her friends at the bedside.  She is interested in residential hospice, but did not want to accept a bed today.  She is approved for residential hospice.  [facility] will update once a bed is open.  Thank you, Venia Carbon DNP, RN [facility] Liaison  (In [place] under hospice or [phone])

## 2022-05-22 NOTE — TOC Initial Note (Signed)
Transition of Care [facility]) - Initial/Assessment Note   Patient Details  Name: Maria [name] MRN: [id number] Date of Birth: [birth year]  Transition of Care [facility]) CM/SW Contact:    Sherie Don, LCSW Phone Number: 05/22/2022, 12:50 PM  Clinical Narrative: Palliative met with patient and residential hospice is being recommended. Patient requested [facility] and palliative made referral. Patient meets criteria for residential hospice, but is not ready for discharge today. [facility] to notify CSW/TOC if a bed is available tomorrow. TOC awaiting hospice bed.  Expected Discharge Plan: [place] Barriers to Discharge: Hospice Bed not available  Patient Goals and CMS Choice Patient states their goals for this hospitalization and ongoing recovery are:: Go to [facility] Medicare.gov Compare Post Acute Care list provided to:: Patient Choice offered to / list presented to : Patient  Expected Discharge Plan and Services Expected Discharge Plan: [place] In-house Referral: Clinical Social Work Post Acute Care Choice: Hospice Living arrangements for the past 2 months: Apartment             DME Arranged: N/A DME Agency: NA  Prior Living Arrangements/Services Living arrangements for the past 2 months: Apartment Patient language and need for interpreter reviewed:: Yes Need for Family Participation in Patient Care: No (Comment) Care giver support system in place?: Yes (comment) Criminal Activity/Legal Involvement Pertinent to Current Situation/Hospitalization: No - Comment as needed  Activities of Daily Living Home Assistive Devices/Equipment: Environmental consultant (specify type), Cane (specify quad or straight), Wheelchair, Eyeglasses (walking sticks) ADL Screening (condition at time of admission) Patient's cognitive ability adequate to safely complete daily activities?: Yes Is the patient deaf or have difficulty hearing?: No Does the patient have difficulty seeing,  even when wearing glasses/contacts?: No Does the patient have difficulty concentrating, remembering, or making decisions?: No Patient able to express need for assistance with ADLs?: Yes Does the patient have difficulty dressing or bathing?: No Independently performs ADLs?: No Communication: Independent Dressing (OT): Independent Grooming: Independent Feeding: Independent Bathing: Independent Toileting: Needs assistance Is this a change from baseline?: Pre-admission baseline In/Out Bed: Needs assistance Is this a change from baseline?: Pre-admission baseline Walks in Home: Needs assistance Is this a change from baseline?: Pre-admission baseline Does the patient have difficulty walking or climbing stairs?: Yes Weakness of Legs: Both Weakness of Arms/Hands: Both  Permission Sought/Granted Permission sought to share information with : Facility Art therapist granted to share information with : Yes, Verbal Permission Granted Permission granted to share info w AGENCY: Optometrist  Emotional Assessment Orientation: : Oriented to Self, Oriented to Place, Oriented to  Time Alcohol / Substance Use: Not Applicable Psych Involvement: No (comment)  Admission diagnosis:  Hypokalemia [E87.6] Hypomagnesemia [E83.42] Hyponatremia [E87.1] Generalized weakness [R53.1] Altered mental status, unspecified altered mental status type [R41.82] FTT (failure to thrive) in adult [R62.7] Patient Active Problem List   Diagnosis Date Noted   Pressure injury of skin 05/21/2022   Generalized weakness 05/20/2022   Hypomagnesemia 05/20/2022   Nausea and vomiting 05/20/2022   FTT (failure to thrive) in adult 05/20/2022   Brain lesion 05/20/2022   Rib lesion 05/20/2022   Malignant neoplasm of right ovary ([place]) 09/18/2021   Coronary atherosclerosis due to calcified coronary lesion 09/17/2021   Degenerative cervical spinal stenosis 02/03/2021   Routine general medical examination at a  health care facility 02/03/2021   Weakness of right hand 12/31/2020   Arthritis of midfoot 06/10/2020   HTN (hypertension) 11/08/2019   Hemochromatosis 09/06/2019   Preventative health  care 06/16/2016   OSA (obstructive sleep apnea) 06/15/2016   Depression 05/14/2016   Multiple thyroid nodules 11/24/2015   Degenerative arthritis of lumbar spine 09/03/2014   Hyponatremia 12/12/2013   Hypokalemia 12/12/2013   OA (osteoarthritis) of knee 12/11/2013   Osteoarthritis 11/14/2013   BENIGN POSITIONAL VERTIGO 01/17/2010   Urinary incontinence 04/26/2009   Hyperlipidemia, mixed 02/04/2009   Vitamin D deficiency 10/23/2008   Osteoporosis 10/23/2008   NEOPLASM, MALIGNANT, LUNG, NON-SMALL CELL 11/21/2007   DIVERTICULOSIS 06/15/2007   GOITER, NODULAR 04/14/2007   PCP:  Charlane Ferretti, MD Pharmacy:   [id number] - [place], [place] [address] [place] [address] Phone: [phone] Fax: [phone]  [place] [address] Phone: [phone] Fax: [phone]  Readmission Risk Interventions     No data to display

## 2022-05-22 NOTE — Progress Notes (Signed)
Daily Progress Note   Patient Name: Maria [name]       Date: 05/22/2022 DOB: 16-Sep-1936  Age: 86 y.o. MRN#: [id number] Attending Physician: Florencia Reasons, MD Primary Care Physician: Charlane Ferretti, MD Admit Date: 05/20/2022  Reason for Consultation/Follow-up: Establishing goals of care  Subjective: Feeling better today  Length of Stay: 1  Current Medications: Scheduled Meds:   magnesium oxide  400 mg Oral BID   nystatin  5 mL Oral QID   oxyCODONE  20 mg Oral 2 times per day   And   oxyCODONE  10 mg Oral Q1400   polyethylene glycol  17 g Oral Daily   potassium chloride  40 mEq Oral Daily   senna-docusate  1 tablet Oral BID    Continuous Infusions:  sodium chloride 100 mL/hr at 05/20/22 2050    PRN Meds: acetaminophen **OR** acetaminophen, morphine injection, prochlorperazine  Physical Exam Constitutional:      General: She is not in acute distress.    Appearance: She is ill-appearing.  Pulmonary:     Effort: Pulmonary effort is normal.  Skin:    General: Skin is warm and dry.  Neurological:     Mental Status: She is alert and oriented to person, place, and time.             Vital Signs: BP (!) 147/85 (BP Location: Left Arm)   Pulse 83   Temp (!) 97.5 F (36.4 C) (Oral)   Resp 16   Ht 5\' 5"  (1.651 m)   Wt 64.3 kg   SpO2 99%   BMI 23.60 kg/m  SpO2: SpO2: 99 % O2 Device: O2 Device: Nasal Cannula O2 Flow Rate: O2 Flow Rate (L/min): 2 L/min  Intake/output summary:  Intake/Output Summary (Last 24 hours) at 05/22/2022 1118 Last data filed at 05/22/2022 6967 Gross per 24 hour  Intake 2366.67 ml  Output 2050 ml  Net 316.67 ml   LBM: Last BM Date : 05/19/22 Baseline Weight: Weight: 63.5 kg Most recent weight: Weight: 64.3 kg       Palliative Assessment/Data: PPS  40%      Patient Active Problem List   Diagnosis Date Noted   Pressure injury of skin 05/21/2022   Generalized weakness 05/20/2022   Hypomagnesemia 05/20/2022   Nausea and vomiting 05/20/2022   FTT (failure to thrive) in adult 05/20/2022   Brain lesion 05/20/2022   Rib lesion 05/20/2022   Malignant neoplasm of right ovary (HCC) 09/18/2021   Coronary atherosclerosis due to calcified coronary lesion 09/17/2021   Degenerative cervical spinal stenosis 02/03/2021   Routine general medical examination at a health care facility 02/03/2021   Weakness of right hand 12/31/2020   Arthritis of midfoot 06/10/2020   HTN (hypertension) 11/08/2019   Hemochromatosis 09/06/2019   Preventative health care 06/16/2016   OSA (obstructive sleep apnea) 06/15/2016   Depression 05/14/2016   Multiple thyroid nodules 11/24/2015   Degenerative arthritis of lumbar spine 09/03/2014   Hyponatremia 12/12/2013   Hypokalemia 12/12/2013   OA (osteoarthritis) of knee 12/11/2013   Osteoarthritis 11/14/2013   BENIGN POSITIONAL VERTIGO 01/17/2010   Urinary incontinence 04/26/2009   Hyperlipidemia, mixed 02/04/2009   Vitamin D deficiency 10/23/2008   Osteoporosis  10/23/2008   NEOPLASM, MALIGNANT, LUNG, NON-SMALL CELL 11/21/2007   DIVERTICULOSIS 06/15/2007   GOITER, NODULAR 04/14/2007    Palliative Care Assessment & Plan   HPI: 86 y.o. female  with past medical history of Metastatic ovarian cancer, OSA on cpap, hereditary hemochromatosis, CAD, and hld admitted on 05/20/2022 with nausea, vomiting, poor p.o. intake, and weakness.  Also experiencing intermittent confusion.  Patient found to have new erosive brain lesion and rib lesion.  PMT consulted for goals of care discussions.  Assessment: Follow up today with patient.  She tells me after extensive discussion with her family and friends she has made the decision to go to [facility].  She feels like she cannot go home safely.  She understands that she is  currently feeling as well she has because of the IV fluids and electrolyte replacement she has received during hospitalization.  She understands decline is expected following discharge.  She also understands she will need significant symptom management and feels that [facility] is the best place to achieve this.  She understands the goal to be to focus on her comfort and avoid aggressive medical interventions.  Patient's sister later called and also asked for details -above discussed with her and she agrees to plan.  Patient tells me symptoms are currently controlled.  Recommendations/Plan: Patient hopeful for discharge to [facility], liaison aware and will see today  Goals of Care and Additional Recommendations: Limitations on Scope of Treatment: Full Comfort Care  Code Status: DNR  Discharge Planning: Hospice facility  Care plan was discussed with patient, family, hospice liaison  Thank you for allowing the Palliative Medicine Team to assist in the care of this patient.  *Please note that this is a verbal dictation therefore any spelling or grammatical errors are due to the "[employer] [employer]" system interpretation.  Juel Burrow, DNP, [facility] Palliative Medicine Team Team Phone # [phone]  Pager [phone]

## 2022-05-22 NOTE — Progress Notes (Signed)
PROGRESS NOTE    Maria [name]  TWS:[id number] DOB: 1936-06-10 DOA: 05/20/2022 PCP: Charlane Ferretti, MD     Brief Narrative:   H/o HLD, CAD, hereditary hemochromatosis, OSA on cpap, stage 4 metastatic ovarian CA,  chronic pain, Presenting with N/V, poor appetite, and weakness, reports getting significantly weak after radiation therapy  Subjective:  She had a busy day with multiple visitors Reports feeling weak, pain is controlled at rest Poor appetite but no n/v since in the hospital, had bm  Assessment & Plan:  Principal Problem:   Generalized weakness Active Problems:   Hyponatremia   Hypokalemia   OSA (obstructive sleep apnea)   Malignant neoplasm of right ovary (HCC)   Hypomagnesemia   Nausea and vomiting   FTT (failure to thrive) in adult   Brain lesion   Rib lesion   Pressure injury of skin    Assessment and Plan:  FTT/cancer pain in the setting of stage IV ovarian cancer With New erosive calvarial lesion, New right rib erosive lesion concerning for mets She is not interested in aggressive therapy,  Currently appear to be comfortable at rest, continue current pain regimen with stool softener Appreciate palliative care input, plan for residential hospice once there is a bed, possible on 7/22  Hyponatremia/hypokalemia/hypomagnesemia Likely from poor oral intake and  nausea vomiting Received hydration and lytes supplement Improved, she would like to continue hydration for now and have lab rechecked in the morning, order placed   Confusion, no confusion currently,  from narcotics? +/- electrolytes derangement    OSA, reports has not used cpap for several months, seems  doing ok     Skin Assessment: I have examined the patient's skin and I agree with the wound assessment as performed by the wound care RN as outlined below: Pressure Injury 05/20/22 Coccyx Mid Stage 2 -  Partial thickness loss of dermis presenting as a shallow open injury with a red, pink  wound bed without slough. slightly abraded 0.5cm oval area within nonblanchable region and blanchable skin (Active)  05/20/22 2100  Location: Coccyx  Location Orientation: Mid  Staging: Stage 2 -  Partial thickness loss of dermis presenting as a shallow open injury with a red, pink wound bed without slough.  Wound Description (Comments): slightly abraded 0.5cm oval area within nonblanchable region and blanchable skin  Present on Admission: Yes  Dressing Type Foam - Lift dressing to assess site every shift 05/22/22 0848       DVT prophylaxis: scd's   Code Status:   Code Status: DNR  Family Communication: family /friends at bedside with permission on 7/20 Disposition:    Dispo: The patient is from: home, lives along              Anticipated d/c is to: residential hospice on 7/22 if there is a bed                Antimicrobials:   Anti-infectives (From admission, onward)    None           Objective: Vitals:   05/21/22 1725 05/21/22 2008 05/22/22 0627 05/22/22 1218  BP: 137/80 (!) 144/72 (!) 147/85 139/81  Pulse: 82 85 83 94  Resp: 17 16 16 16   Temp: 97.8 F (36.6 C) 98 F (36.7 C) (!) 97.5 F (36.4 C) 97.6 F (36.4 C)  TempSrc: Oral Oral Oral Oral  SpO2: 99% 100% 99% 96%  Weight:      Height:  Intake/Output Summary (Last 24 hours) at 05/22/2022 1852 Last data filed at 05/22/2022 1800 Gross per 24 hour  Intake 2895.03 ml  Output 1001 ml  Net 1894.03 ml   Filed Weights   05/20/22 0803 05/20/22 2059  Weight: 63.5 kg 64.3 kg    Examination:  General exam: alert, awake, communicative,calm, NAD Respiratory system: Clear to auscultation. Respiratory effort normal. Cardiovascular system:  RRR.  Gastrointestinal system: Abdomen is nondistended, soft and nontender.  Normal bowel sounds heard. Central nervous system: Alert and oriented. No focal neurological deficits. Extremities:  no edema Skin: No rashes, lesions or ulcers Psychiatry: Judgement and  insight appear normal. Mood & affect appropriate.     Data Reviewed: I have personally reviewed  labs and visualized  imaging studies since the last encounter and formulate the plan        Scheduled Meds:  magnesium oxide  400 mg Oral BID   nystatin  5 mL Oral QID   oxyCODONE  20 mg Oral 2 times per day   And   oxyCODONE  10 mg Oral Q1400   polyethylene glycol  17 g Oral Daily   potassium chloride  40 mEq Oral Daily   senna-docusate  1 tablet Oral BID   Continuous Infusions:  sodium chloride 100 mL/hr at 05/20/22 2050     LOS: 1 day      Florencia Reasons, MD PhD FACP [facility]  Available via Epic secure chat 7am-7pm for nonurgent issues Please page for urgent issues To page the attending provider between 7A-7P or the covering provider during after hours 7P-7A, please log into the web site [email or web address] and access using universal [place] password for that web site. If you do not have the password, please call the hospital operator.    05/22/2022, 6:52 PM

## 2022-05-23 DIAGNOSIS — R531 Weakness: Secondary | ICD-10-CM | POA: Diagnosis not present

## 2022-05-23 LAB — BASIC METABOLIC PANEL
Anion gap: 7 (ref 5–15)
BUN: 7 mg/dL — ABNORMAL LOW (ref 8–23)
CO2: 31 mmol/L (ref 22–32)
Calcium: 9 mg/dL (ref 8.9–10.3)
Chloride: 104 mmol/L (ref 98–111)
Creatinine, Ser: 0.55 mg/dL (ref 0.44–1.00)
GFR, Estimated: >60 mL/min (ref >60)
Glucose, Bld: 115 mg/dL — ABNORMAL HIGH (ref 70–99)
Potassium: 3.9 mmol/L (ref 3.5–5.1)
Sodium: 142 mmol/L (ref 135–145)

## 2022-05-23 LAB — CBC
HCT: 39.4 % (ref 36.0–46.0)
Hemoglobin: 13.2 g/dL (ref 12.0–15.0)
MCH: 30.8 pg (ref 26.0–34.0)
MCHC: 33.5 g/dL (ref 30.0–36.0)
MCV: 92.1 fL (ref 80.0–100.0)
Platelets: 225 10*3/uL (ref 150–400)
RBC: 4.28 MIL/uL (ref 3.87–5.11)
RDW: 12.1 % (ref 11.5–15.5)
WBC: 5.4 10*3/uL (ref 4.0–10.5)
nRBC: 0 % (ref 0.0–0.2)

## 2022-05-23 NOTE — Discharge Summary (Signed)
Discharge Summary  Maria [name] YSA:[id number] DOB: 10/29/1936  PCP: Charlane Ferretti, MD  Admit date: 05/20/2022 Discharge date: 05/23/2022  Time spent:  93mins  Discharge to residential hospice    Discharge Diagnoses:  Active Hospital Problems   Diagnosis Date Noted   Generalized weakness 05/20/2022   Pressure injury of skin 05/21/2022   Hypomagnesemia 05/20/2022   Nausea and vomiting 05/20/2022   FTT (failure to thrive) in adult 05/20/2022   Brain lesion 05/20/2022   Rib lesion 05/20/2022   Malignant neoplasm of right ovary ([place]) 09/18/2021   OSA (obstructive sleep apnea) 06/15/2016   Hypokalemia 12/12/2013   Hyponatremia 12/12/2013    Resolved Hospital Problems  No resolved problems to display.    Discharge Condition: stable  Diet recommendation: regular diet   Filed Weights   05/20/22 0803 05/20/22 2059  Weight: 63.5 kg 64.3 kg    History of present illness: ( per admitting provider Dr Marylyn Ishihara)  Maria [name] is a 86 y.o. female with medical history significant of S4 metastatic ovarian CA, HLD, chronic pain, OSA on cpap, hereditary hemochromatosis, CAD. Presenting with N/V, poor appetite, and weakness. She receives her onco care [facility]. She is aware that is in incurable. She had palliative radiotherapy a few weeks ago. Since that time, she has had N/V and poor appetite. She has become increasingly weak. Her family was concerned that she was showing intermittent periods of confusion. Given her constellation of symptoms, her family be came concerned and brought her to the ED for evaluation. They deny any other aggravating or alleviating factors.   Hospital Course:  Principal Problem:   Generalized weakness Active Problems:   Hyponatremia   Hypokalemia   OSA (obstructive sleep apnea)   Malignant neoplasm of right ovary (HCC)   Hypomagnesemia   Nausea and vomiting   FTT (failure to thrive) in adult   Brain lesion   Rib lesion   Pressure injury of  skin   Assessment and Plan:   FTT/cancer pain in the setting of stage IV ovarian cancer With New erosive calvarial lesion, New right rib erosive lesion concerning for mets She is not interested in aggressive therapy,  Currently appear to be comfortable at rest, continue current pain regimen with stool softener Appreciate palliative care input, discharge to residential hospice once there is a bed.   Hyponatremia/hypokalemia/hypomagnesemia Likely from poor oral intake and  nausea vomiting from XRT Received hydration and lytes supplement Improved.   Confusion Reports confusion at home from narcotics? +/- electrolytes derangement   no confusion currently     OSA, reports has not used cpap for several months, seems  doing ok   Pressure ulcer presents on admission: Pressure Injury 05/20/22 Coccyx Mid Stage 2 -  Partial thickness loss of dermis presenting as a shallow open injury with a red, pink wound bed without slough. slightly abraded 0.5cm oval area within nonblanchable region and blanchable skin (Active)  05/20/22 2100  Location: Coccyx  Location Orientation: Mid  Staging: Stage 2 -  Partial thickness loss of dermis presenting as a shallow open injury with a red, pink wound bed without slough.  Wound Description (Comments): slightly abraded 0.5cm oval area within nonblanchable region and blanchable skin  Present on Admission: Yes      Discharge Exam: BP (!) 145/85 (BP Location: Left Arm)   Pulse 85   Temp 97.6 F (36.4 C) (Oral)   Resp 16   Ht 5\' 5"  (1.651 m)   Wt 64.3 kg  SpO2 94%   BMI 23.60 kg/m   General: NAD, AAOx3 Cardiovascular: RRR Respiratory: normal respiratory effort    Discharge Instructions     Diet general   Complete by: As directed    Discharge wound care:   Complete by: As directed    Pressure offloading      Allergies as of 05/23/2022   No Known Allergies      Medication List     STOP taking these medications    atorvastatin  20 MG tablet Commonly known as: LIPITOR       TAKE these medications    DULoxetine 30 MG capsule Commonly known as: CYMBALTA TAKE 1 CAPSULE BY MOUTH EVERY DAY   nystatin 100000 UNIT/ML suspension Commonly known as: MYCOSTATIN Take 5 mLs by mouth 4 (four) times daily. Swish and swallow   Oxycodone HCl 10 MG Tabs Take 10-20 mg by mouth See admin instructions. Take 2 tablets (20 mg) by mouth every morning and night; take 1 tablet (10 mg) midday               Discharge Care Instructions  (From admission, onward)           Start     Ordered   05/23/22 0000  Discharge wound care:       Comments: Pressure offloading   05/23/22 0824           No Known Allergies    The results of significant diagnostics from this hospitalization (including imaging, microbiology, ancillary and laboratory) are listed below for reference.    Significant Diagnostic Studies: CT Head Wo Contrast  Result Date: 05/20/2022 CLINICAL DATA:  Mental status change, unknown cause EXAM: CT HEAD WITHOUT CONTRAST TECHNIQUE: Contiguous axial images were obtained from the base of the skull through the vertex without intravenous contrast. RADIATION DOSE REDUCTION: This exam was performed according to the departmental dose-optimization program which includes automated exposure control, adjustment of the mA and/or kV according to patient size and/or use of iterative reconstruction technique. COMPARISON:  MRI head January 19, 2021 FINDINGS: Brain: No evidence of acute infarction, hemorrhage, hydrocephalus, extra-axial collection or mass lesion/mass effect. Cerebral atrophy. Mild patchy white matter hypodensities, nonspecific but compatible with chronic microvascular ischemic disease. Vascular: Calcific intracranial atherosclerosis. Skull: Erosive/destructive left parietal calvarial lesion with extension into the overlying soft tissues (for example see series 2 and 3, image 24). Sinuses/Orbits: Completely opacified  left maxillary sinus. No acute orbital findings. Other: No mastoid effusions. IMPRESSION: 1. Erosive/destructive left parietal calvarial lesion with extension into the overlying extracranial soft tissues, concerning for malignancy and likely metastatic disease. Recommend MRI with contrast to further evaluate this lesion also to exclude intracranial metastatic disease. 2. Completely opacified left maxillary sinus. Electronically Signed   By: Margaretha Sheffield M.D.   On: 05/20/2022 12:20   DG Chest Portable 1 View  Result Date: 05/20/2022 CLINICAL DATA:  Weakness. EXAM: PORTABLE CHEST 1 VIEW COMPARISON:  July 21, 2020. FINDINGS: The heart size and mediastinal contours are within normal limits. Left lung is clear. Minimal right basilar subsegmental atelectasis or scarring is noted. Expansile destructive lesion is seen involving the lateral portion of the right sixth rib concerning for metastatic disease. IMPRESSION: Expansile destructive lesion seen involving lateral portion of right sixth rib concerning for metastatic disease. CT scan of the chest is recommended for further evaluation. Minimal right basilar subsegmental atelectasis or scarring is noted. Electronically Signed   By: Marijo Conception M.D.   On: 05/20/2022 08:47  Microbiology: No results found for this or any previous visit (from the past 240 hour(s)).   Labs: Basic Metabolic Panel: Recent Labs  Lab 05/20/22 0848 05/21/22 0355 05/23/22 0350  NA 131* 137 142  K 3.0* 4.4 3.9  CL 90* 101 104  CO2 30 28 31   GLUCOSE 121* 100* 115*  BUN 10 9 7*  CREATININE 0.55 0.50 0.55  CALCIUM 8.8* 8.5* 9.0  MG 1.5* 1.8  --   PHOS  --  3.4  --    Liver Function Tests: Recent Labs  Lab 05/20/22 0848 05/21/22 0355  AST 30  --   ALT 15  --   ALKPHOS 141*  --   BILITOT 0.9  --   PROT 6.9  --   ALBUMIN 3.4* 2.9*   No results for input(s): "LIPASE", "AMYLASE" in the last 168 hours. No results for input(s): "AMMONIA" in the last 168  hours. CBC: Recent Labs  Lab 05/20/22 0848 05/23/22 0350  WBC 10.9* 5.4  NEUTROABS 9.0*  --   HGB 14.8 13.2  HCT 44.3 39.4  MCV 92.3 92.1  PLT 211 225   Cardiac Enzymes: No results for input(s): "CKTOTAL", "CKMB", "CKMBINDEX", "TROPONINI" in the last 168 hours. BNP: BNP (last 3 results) No results for input(s): "BNP" in the last 8760 hours.  ProBNP (last 3 results) No results for input(s): "PROBNP" in the last 8760 hours.  CBG: Recent Labs  Lab 05/20/22 0835  GLUCAP 117*    FURTHER DISCHARGE INSTRUCTIONS:   Get Medicines reviewed and adjusted: Please take all your medications with you for your next visit with your Primary MD   Laboratory/radiological data: Please request your Primary MD to go over all hospital tests and procedure/radiological results at the follow up, please ask your Primary MD to get all Hospital records sent to his/her office.   In some cases, they will be blood work, cultures and biopsy results pending at the time of your discharge. Please request that your primary care M.D. goes through all the records of your hospital data and follows up on these results.   Also Note the following: If you experience worsening of your admission symptoms, develop shortness of breath, life threatening emergency, suicidal or homicidal thoughts you must seek medical attention immediately by calling 911 or calling your MD immediately  if symptoms less severe.   You must read complete instructions/literature along with all the possible adverse reactions/side effects for all the Medicines you take and that have been prescribed to you. Take any new Medicines after you have completely understood and accpet all the possible adverse reactions/side effects.    Do not drive when taking Pain medications or sleeping medications (Benzodaizepines)   Do not take more than prescribed Pain, Sleep and Anxiety Medications. It is not advisable to combine anxiety,sleep and pain medications  without talking with your primary care practitioner   Special Instructions: If you have smoked or chewed Tobacco  in the last 2 yrs please stop smoking, stop any regular Alcohol  and or any Recreational drug use.   Wear Seat belts while driving.   Please note: You were cared for by a hospitalist during your hospital stay. Once you are discharged, your primary care physician will handle any further medical issues. Please note that NO REFILLS for any discharge medications will be authorized once you are discharged, as it is imperative that you return to your primary care physician (or establish a relationship with a primary care physician if you do not  have one) for your post hospital discharge needs so that they can reassess your need for medications and monitor your lab values.     Signed:  Florencia Reasons MD, PhD, FACP  [facility] 05/23/2022, 2:19 PM

## 2022-05-23 NOTE — TOC Transition Note (Signed)
Transition of Care [facility]) - CM/SW Discharge Note  Patient Details  Name: YIANNA TERSIGNI MRN: [id number] Date of Birth: 1936/10/22  Transition of Care [facility]) CM/SW Contact:  Sherie Don, LCSW Phone Number: 05/23/2022, 2:57 PM  Clinical Narrative: Patient has a bed available at [facility] today and family has signed consents. The number for report is [phone]. Medical necessity form done; PTAR scheduled by [facility]. Discharge packet completed. RN updated. TOC signing off.  Final next level of care: [place] Barriers to Discharge: Barriers Resolved  Patient Goals and CMS Choice Patient states their goals for this hospitalization and ongoing recovery are:: Go to [facility] Medicare.gov Compare Post Acute Care list provided to:: Patient Choice offered to / list presented to : Patient  Discharge Placement        Patient chooses bed at: Other - please specify in the comment section below: ([place]) Patient to be transferred to facility by: PTAR Patient and family notified of of transfer: 05/23/22  Discharge Plan and Services In-house Referral: Clinical Social Work Post Acute Care Choice: Hospice          DME Arranged: N/A DME Agency: NA  Readmission Risk Interventions     No data to display

## 2022-05-23 NOTE — Progress Notes (Signed)
Report given to [facility]. PTAR to transport the patient. Pt alert and oriented resting comfortably on chair. IV left in as per [facility] RN request. Pt belongings sent with pt.

## 2022-05-23 NOTE — Progress Notes (Signed)
[facility]) Hospital Liaison Note  Ms. Tax as accepted the bed for [facility] and requests transfer today.  Consents pending. Once complete, we can arrange for transport.  Please send signed and completed DNR with patient.  RN please call report to [phone].  Please call with any hospice related questions or concerns.  Thank you, Margaretmary Eddy, BSN, RN [facility] Liaison  [phone]

## 2022-05-23 NOTE — Plan of Care (Signed)
  Problem: Education: Goal: Knowledge of General Education information will improve Description: Including pain rating scale, medication(s)/side effects and non-pharmacologic comfort measures Outcome: Progressing   Problem: Activity: Goal: Risk for activity intolerance will decrease Outcome: Progressing   Problem: Pain Managment: Goal: General experience of comfort will improve Outcome: Progressing   

## 2022-06-13 ENCOUNTER — Inpatient Hospital Stay (HOSPITAL_COMMUNITY)

## 2022-06-13 ENCOUNTER — Encounter (HOSPITAL_COMMUNITY): Payer: Self-pay

## 2022-06-13 ENCOUNTER — Other Ambulatory Visit: Payer: Self-pay

## 2022-06-13 ENCOUNTER — Emergency Department (HOSPITAL_COMMUNITY)

## 2022-06-13 ENCOUNTER — Inpatient Hospital Stay (HOSPITAL_COMMUNITY)
Admission: EM | Admit: 2022-06-13 | Discharge: 2022-07-03 | DRG: 542 | Disposition: E | Attending: Family Medicine | Admitting: Family Medicine

## 2022-06-13 DIAGNOSIS — C779 Secondary and unspecified malignant neoplasm of lymph node, unspecified: Secondary | ICD-10-CM | POA: Diagnosis present

## 2022-06-13 DIAGNOSIS — C787 Secondary malignant neoplasm of liver and intrahepatic bile duct: Secondary | ICD-10-CM | POA: Diagnosis present

## 2022-06-13 DIAGNOSIS — R0602 Shortness of breath: Secondary | ICD-10-CM | POA: Diagnosis not present

## 2022-06-13 DIAGNOSIS — M84551A Pathological fracture in neoplastic disease, right femur, initial encounter for fracture: Principal | ICD-10-CM | POA: Diagnosis present

## 2022-06-13 DIAGNOSIS — Z66 Do not resuscitate: Secondary | ICD-10-CM | POA: Diagnosis present

## 2022-06-13 DIAGNOSIS — J9602 Acute respiratory failure with hypercapnia: Secondary | ICD-10-CM | POA: Diagnosis not present

## 2022-06-13 DIAGNOSIS — I10 Essential (primary) hypertension: Secondary | ICD-10-CM | POA: Diagnosis present

## 2022-06-13 DIAGNOSIS — C7951 Secondary malignant neoplasm of bone: Secondary | ICD-10-CM | POA: Diagnosis present

## 2022-06-13 DIAGNOSIS — Z79891 Long term (current) use of opiate analgesic: Secondary | ICD-10-CM

## 2022-06-13 DIAGNOSIS — I341 Nonrheumatic mitral (valve) prolapse: Secondary | ICD-10-CM | POA: Diagnosis present

## 2022-06-13 DIAGNOSIS — Z8249 Family history of ischemic heart disease and other diseases of the circulatory system: Secondary | ICD-10-CM

## 2022-06-13 DIAGNOSIS — Z823 Family history of stroke: Secondary | ICD-10-CM

## 2022-06-13 DIAGNOSIS — C569 Malignant neoplasm of unspecified ovary: Secondary | ICD-10-CM | POA: Diagnosis present

## 2022-06-13 DIAGNOSIS — Z789 Other specified health status: Secondary | ICD-10-CM | POA: Diagnosis not present

## 2022-06-13 DIAGNOSIS — I2510 Atherosclerotic heart disease of native coronary artery without angina pectoris: Secondary | ICD-10-CM | POA: Diagnosis present

## 2022-06-13 DIAGNOSIS — Z801 Family history of malignant neoplasm of trachea, bronchus and lung: Secondary | ICD-10-CM

## 2022-06-13 DIAGNOSIS — Z923 Personal history of irradiation: Secondary | ICD-10-CM

## 2022-06-13 DIAGNOSIS — Z800 Family history of malignant neoplasm of digestive organs: Secondary | ICD-10-CM

## 2022-06-13 DIAGNOSIS — Y92019 Unspecified place in single-family (private) house as the place of occurrence of the external cause: Secondary | ICD-10-CM | POA: Diagnosis not present

## 2022-06-13 DIAGNOSIS — Z515 Encounter for palliative care: Secondary | ICD-10-CM

## 2022-06-13 DIAGNOSIS — J9601 Acute respiratory failure with hypoxia: Secondary | ICD-10-CM | POA: Diagnosis not present

## 2022-06-13 DIAGNOSIS — Z87891 Personal history of nicotine dependence: Secondary | ICD-10-CM | POA: Diagnosis not present

## 2022-06-13 DIAGNOSIS — Z825 Family history of asthma and other chronic lower respiratory diseases: Secondary | ICD-10-CM

## 2022-06-13 DIAGNOSIS — M47816 Spondylosis without myelopathy or radiculopathy, lumbar region: Secondary | ICD-10-CM | POA: Diagnosis present

## 2022-06-13 DIAGNOSIS — C799 Secondary malignant neoplasm of unspecified site: Secondary | ICD-10-CM | POA: Diagnosis not present

## 2022-06-13 DIAGNOSIS — Z85118 Personal history of other malignant neoplasm of bronchus and lung: Secondary | ICD-10-CM | POA: Diagnosis not present

## 2022-06-13 DIAGNOSIS — G4733 Obstructive sleep apnea (adult) (pediatric): Secondary | ICD-10-CM | POA: Diagnosis present

## 2022-06-13 DIAGNOSIS — G8929 Other chronic pain: Secondary | ICD-10-CM | POA: Diagnosis present

## 2022-06-13 DIAGNOSIS — S72001A Fracture of unspecified part of neck of right femur, initial encounter for closed fracture: Secondary | ICD-10-CM | POA: Diagnosis not present

## 2022-06-13 DIAGNOSIS — E876 Hypokalemia: Secondary | ICD-10-CM | POA: Diagnosis present

## 2022-06-13 DIAGNOSIS — Z96653 Presence of artificial knee joint, bilateral: Secondary | ICD-10-CM | POA: Diagnosis present

## 2022-06-13 DIAGNOSIS — W19XXXA Unspecified fall, initial encounter: Secondary | ICD-10-CM | POA: Diagnosis present

## 2022-06-13 DIAGNOSIS — M8580 Other specified disorders of bone density and structure, unspecified site: Secondary | ICD-10-CM | POA: Diagnosis present

## 2022-06-13 DIAGNOSIS — S72009A Fracture of unspecified part of neck of unspecified femur, initial encounter for closed fracture: Secondary | ICD-10-CM | POA: Diagnosis present

## 2022-06-13 DIAGNOSIS — Z8261 Family history of arthritis: Secondary | ICD-10-CM

## 2022-06-13 DIAGNOSIS — E559 Vitamin D deficiency, unspecified: Secondary | ICD-10-CM | POA: Diagnosis present

## 2022-06-13 DIAGNOSIS — E785 Hyperlipidemia, unspecified: Secondary | ICD-10-CM | POA: Diagnosis present

## 2022-06-13 LAB — COMPREHENSIVE METABOLIC PANEL
ALT: 18 U/L (ref 0–44)
AST: 40 U/L (ref 15–41)
Albumin: 3.5 g/dL (ref 3.5–5.0)
Alkaline Phosphatase: 222 U/L — ABNORMAL HIGH (ref 38–126)
Anion gap: 16 — ABNORMAL HIGH (ref 5–15)
BUN: 11 mg/dL (ref 8–23)
CO2: 25 mmol/L (ref 22–32)
Calcium: 9.3 mg/dL (ref 8.9–10.3)
Chloride: 96 mmol/L — ABNORMAL LOW (ref 98–111)
Creatinine, Ser: 0.78 mg/dL (ref 0.44–1.00)
GFR, Estimated: >60 mL/min (ref >60)
Glucose, Bld: 143 mg/dL — ABNORMAL HIGH (ref 70–99)
Potassium: 2.7 mmol/L — CL (ref 3.5–5.1)
Sodium: 137 mmol/L (ref 135–145)
Total Bilirubin: 1.5 mg/dL — ABNORMAL HIGH (ref 0.3–1.2)
Total Protein: 6.8 g/dL (ref 6.5–8.1)

## 2022-06-13 LAB — CBC WITH DIFFERENTIAL/PLATELET
Abs Immature Granulocytes: 0.06 10*3/uL (ref 0.00–0.07)
Basophils Absolute: 0 10*3/uL (ref 0.0–0.1)
Basophils Relative: 0 %
Eosinophils Absolute: 0 10*3/uL (ref 0.0–0.5)
Eosinophils Relative: 0 %
HCT: 42.6 % (ref 36.0–46.0)
Hemoglobin: 14.8 g/dL (ref 12.0–15.0)
Immature Granulocytes: 1 %
Lymphocytes Relative: 5 %
Lymphs Abs: 0.5 10*3/uL — ABNORMAL LOW (ref 0.7–4.0)
MCH: 31 pg (ref 26.0–34.0)
MCHC: 34.7 g/dL (ref 30.0–36.0)
MCV: 89.1 fL (ref 80.0–100.0)
Monocytes Absolute: 1 10*3/uL (ref 0.1–1.0)
Monocytes Relative: 10 %
Neutro Abs: 8.5 10*3/uL — ABNORMAL HIGH (ref 1.7–7.7)
Neutrophils Relative %: 84 %
Platelets: 220 10*3/uL (ref 150–400)
RBC: 4.78 MIL/uL (ref 3.87–5.11)
RDW: 12.6 % (ref 11.5–15.5)
WBC: 10.1 10*3/uL (ref 4.0–10.5)
nRBC: 0 % (ref 0.0–0.2)

## 2022-06-13 LAB — PROTIME-INR
INR: 1 (ref 0.8–1.2)
Prothrombin Time: 13.2 seconds (ref 11.4–15.2)

## 2022-06-13 LAB — TYPE AND SCREEN
ABO/RH(D): O POS
Antibody Screen: NEGATIVE

## 2022-06-13 MED ORDER — POTASSIUM CHLORIDE CRYS ER 20 MEQ PO TBCR
40.0000 meq | EXTENDED_RELEASE_TABLET | Freq: Two times a day (BID) | ORAL | Status: DC
Start: 2022-06-13 — End: 2022-06-13
  Administered 2022-06-13: 40 meq via ORAL
  Filled 2022-06-13: qty 2

## 2022-06-13 MED ORDER — OXYCODONE HCL 5 MG PO TABS
20.0000 mg | ORAL_TABLET | Freq: Two times a day (BID) | ORAL | Status: DC
  Administered 2022-06-13: 20 mg via ORAL
  Filled 2022-06-13: qty 4

## 2022-06-13 MED ORDER — POLYETHYLENE GLYCOL 3350 17 G PO PACK
17.0000 g | PACK | Freq: Every day | ORAL | Status: DC | PRN
  Administered 2022-06-13: 17 g via ORAL
  Filled 2022-06-13: qty 1

## 2022-06-13 MED ORDER — METHOCARBAMOL 500 MG PO TABS: 500.0000 mg | ORAL_TABLET | Freq: Four times a day (QID) | ORAL | Status: DC | PRN

## 2022-06-13 MED ORDER — MORPHINE 100MG IN NS 100ML (1MG/ML) PREMIX INFUSION
14.0000 mg/h | INTRAVENOUS | Status: DC
  Administered 2022-06-13: 5 mg/h via INTRAVENOUS
  Administered 2022-06-14 – 2022-06-16 (×6): 7 mg/h via INTRAVENOUS
  Administered 2022-06-17: 10 mg/h via INTRAVENOUS
  Administered 2022-06-17: 7 mg/h via INTRAVENOUS
  Administered 2022-06-18: 14 mg/h via INTRAVENOUS
  Administered 2022-06-18: 10 mg/h via INTRAVENOUS
  Filled 2022-06-13 (×11): qty 100

## 2022-06-13 MED ORDER — ONDANSETRON HCL 4 MG/2ML IJ SOLN
4.0000 mg | Freq: Once | INTRAMUSCULAR | Status: AC
  Administered 2022-06-13: 4 mg via INTRAVENOUS
  Filled 2022-06-13: qty 2

## 2022-06-13 MED ORDER — DOCUSATE SODIUM 100 MG PO CAPS: 100.0000 mg | ORAL_CAPSULE | Freq: Two times a day (BID) | ORAL | Status: DC

## 2022-06-13 MED ORDER — LORAZEPAM 1 MG PO TABS: 1.0000 mg | ORAL_TABLET | ORAL | Status: DC | PRN

## 2022-06-13 MED ORDER — HYDROMORPHONE HCL 1 MG/ML IJ SOLN
1.0000 mg | Freq: Once | INTRAMUSCULAR | Status: AC
  Administered 2022-06-13: 1 mg via INTRAVENOUS
  Filled 2022-06-13: qty 1

## 2022-06-13 MED ORDER — LORAZEPAM 2 MG/ML PO CONC: 1.0000 mg | ORAL | Status: DC | PRN

## 2022-06-13 MED ORDER — POLYETHYLENE GLYCOL 3350 17 GM/SCOOP PO POWD
17.0000 g | Freq: Every morning | ORAL | Status: DC
Start: 2022-06-13 — End: 2022-06-13
  Filled 2022-06-13: qty 255

## 2022-06-13 MED ORDER — BIOTENE DRY MOUTH MT LIQD: 15.0000 mL | OROMUCOSAL | Status: DC | PRN

## 2022-06-13 MED ORDER — OXYCODONE HCL 5 MG PO TABS: 10.0000 mg | ORAL_TABLET | Freq: Every day | ORAL | Status: DC

## 2022-06-13 MED ORDER — GLYCOPYRROLATE 0.2 MG/ML IJ SOLN: 0.2000 mg | INTRAMUSCULAR | Status: DC | PRN

## 2022-06-13 MED ORDER — BISACODYL 5 MG PO TBEC: 5.0000 mg | DELAYED_RELEASE_TABLET | Freq: Every day | ORAL | Status: DC | PRN

## 2022-06-13 MED ORDER — ONDANSETRON HCL 4 MG/2ML IJ SOLN: 4.0000 mg | Freq: Four times a day (QID) | INTRAMUSCULAR | Status: DC | PRN

## 2022-06-13 MED ORDER — ONDANSETRON 4 MG PO TBDP: 4.0000 mg | ORAL_TABLET | Freq: Four times a day (QID) | ORAL | Status: DC | PRN

## 2022-06-13 MED ORDER — DIPHENHYDRAMINE HCL 50 MG/ML IJ SOLN: 12.5000 mg | INTRAMUSCULAR | Status: DC | PRN

## 2022-06-13 MED ORDER — POLYETHYLENE GLYCOL 3350 17 G PO PACK
17.0000 g | PACK | Freq: Every day | ORAL | Status: DC
  Administered 2022-06-13: 17 g via ORAL
  Filled 2022-06-13: qty 1

## 2022-06-13 MED ORDER — METHOCARBAMOL 1000 MG/10ML IJ SOLN: 500.0000 mg | Freq: Four times a day (QID) | INTRAVENOUS | Status: DC | PRN

## 2022-06-13 MED ORDER — POLYVINYL ALCOHOL 1.4 % OP SOLN: 1.0000 [drp] | Freq: Four times a day (QID) | OPHTHALMIC | Status: DC | PRN

## 2022-06-13 MED ORDER — GADOBUTROL 1 MMOL/ML IV SOLN
6.0000 mL | Freq: Once | INTRAVENOUS | Status: AC | PRN
Start: 2022-06-13 — End: 2022-06-13
  Administered 2022-06-13: 6 mL via INTRAVENOUS

## 2022-06-13 MED ORDER — MORPHINE BOLUS VIA INFUSION: 2.0000 mg | INTRAVENOUS | Status: DC | PRN

## 2022-06-13 MED ORDER — HALOPERIDOL LACTATE 2 MG/ML PO CONC: 0.5000 mg | ORAL | Status: DC | PRN

## 2022-06-13 MED ORDER — HYDROMORPHONE HCL 1 MG/ML IJ SOLN
2.0000 mg | Freq: Once | INTRAMUSCULAR | Status: AC
  Administered 2022-06-13: 2 mg via INTRAVENOUS
  Filled 2022-06-13: qty 2

## 2022-06-13 MED ORDER — LORAZEPAM 1 MG PO TABS
1.0000 mg | ORAL_TABLET | ORAL | Status: DC | PRN
Start: 2022-06-13 — End: 2022-06-13

## 2022-06-13 MED ORDER — OXYCODONE HCL 5 MG PO TABS: 5.0000 mg | ORAL_TABLET | ORAL | Status: DC | PRN

## 2022-06-13 MED ORDER — LACTATED RINGERS IV SOLN: INTRAVENOUS | Status: DC

## 2022-06-13 MED ORDER — MORPHINE SULFATE (PF) 2 MG/ML IV SOLN: 2.0000 mg | INTRAVENOUS | Status: DC | PRN

## 2022-06-13 MED ORDER — HALOPERIDOL 0.5 MG PO TABS: 0.5000 mg | ORAL_TABLET | ORAL | Status: DC | PRN

## 2022-06-13 MED ORDER — GLYCOPYRROLATE 1 MG PO TABS: 1.0000 mg | ORAL_TABLET | ORAL | Status: DC | PRN

## 2022-06-13 MED ORDER — HALOPERIDOL LACTATE 5 MG/ML IJ SOLN: 0.5000 mg | INTRAMUSCULAR | Status: DC | PRN

## 2022-06-13 MED ORDER — ACETAMINOPHEN 325 MG PO TABS: 650.0000 mg | ORAL_TABLET | Freq: Four times a day (QID) | ORAL | Status: DC | PRN

## 2022-06-13 MED ORDER — LORAZEPAM 2 MG/ML IJ SOLN: 1.0000 mg | INTRAMUSCULAR | Status: DC | PRN

## 2022-06-13 MED ORDER — ACETAMINOPHEN 650 MG RE SUPP: 650.0000 mg | Freq: Four times a day (QID) | RECTAL | Status: DC | PRN

## 2022-06-13 NOTE — Progress Notes (Signed)
Patient has had generally good pain control throughout the day.  She remained indecisive about surgery.  However, CT and MRI were completed and both show extensive metastatic disease with pathological fracture.  With this new information, the patient and family were in agreement with transition to comfort only.  She will be started on a morphine drip.  [facility] was contacted and there is no bed available today at [facility].  Will change to palliative care status, admit upstairs (if bed is available) for comfort care, request palliative care consult, and transfer to [facility] when able.  She does not appear to be actively dying at this time but has a likely prognosis of days to weeks given the extent of her metastatic spread.  Family would be unable to provide the level of care she needs at home.   Carlyon Shadow, M.D.

## 2022-06-13 NOTE — Progress Notes (Signed)
Patient discussed with admitting attending.  Patient apparently recently on hospice care for metastatic cancer and unsure at this time if she would like to proceed with operative orthopaedic intervention for R hip fracture.  Will hold off on formal consultation for now but will remain available if patient/family decide they would like to proceed with operative intervention.  Georgeanna Harrison M.D. Orthopaedic Surgery [facility]

## 2022-06-13 NOTE — ED Provider Notes (Signed)
[facility] EMERGENCY DEPARTMENT Provider Note   CSN: [id number] Arrival date & time: 06/24/2022  0531     History  Chief Complaint  Patient presents with   Maria [name]    Maria [name] is a 86 y.o. female.   Fall  Patient presents after fall at home.  Walks with walker at baseline became unsteady and fell.  Pain in right hip.  Did not hit head.  Not on blood thinners.  However unfortunately does have metastatic ovarian cancer.  Just recently got out of hospice.  States she was in hospice for 2 weeks.  Is on chronic pain medicines. Has seen [facility] in the past for her knee surgeries.   Past Medical History:  Diagnosis Date   Cataract    Complication of anesthesia    slow to awaken   Depression    Diverticulosis    Goiter, nodular    [place] of thyroid stable 7/08   Headache    hx of   Heart murmur    due to rheumatic fever as child   Hemochromatosis, hereditary ([place]) 09/01/2019   Mitral valve prolapse    Multiple thyroid nodules 11/24/2015   Neoplasm of lung, malignant ([place]) 2002   Non-small cell surgical tx   Osteoarthritis 11/14/2013   2010 intra-articular steroids X 3 S/P Physical Therapy in 12/14 TKR 12/11/13    Osteopenia    Dr Pamala Hurry   PONV (postoperative nausea and vomiting)    n/v   Sleep apnea    Vitamin D deficiency     Home Medications Prior to Admission medications   Medication Sig Start Date End Date Taking? Authorizing Provider  DULoxetine (CYMBALTA) 30 MG capsule TAKE 1 CAPSULE BY MOUTH EVERY DAY Patient not taking: Reported on 05/20/2022 05/10/22   Janith Lima, MD  nystatin (MYCOSTATIN) 100000 UNIT/ML suspension Take 5 mLs by mouth 4 (four) times daily. Swish and swallow 05/16/22   [provider]  Oxycodone HCl 10 MG TABS Take 10-20 mg by mouth See admin instructions. Take 2 tablets (20 mg) by mouth every morning and night; take 1 tablet (10 mg) midday 04/29/22   [provider]      Allergies    Patient has no  known allergies.    Review of Systems   Review of Systems  Physical Exam Updated Vital Signs BP (!) 155/88 (BP Location: Right Arm)   Pulse 96   Temp 98.3 F (36.8 C) (Oral)   Resp 16   Ht 5\' 5"  (1.651 m)   Wt 64.3 kg   SpO2 94%   BMI 23.60 kg/m  Physical Exam Vitals and nursing note reviewed.  HENT:     Head: Atraumatic.  Cardiovascular:     Rate and Rhythm: Regular rhythm.  Chest:     Chest wall: No tenderness.  Abdominal:     Tenderness: There is no abdominal tenderness.  Musculoskeletal:        General: Tenderness present.     Cervical back: Neck supple.     Comments: Tenderness to right hip with decreased range of motion.  Neurovascular intact in feet.  No knee tenderness.  Neurological:     Mental Status: She is alert and oriented to person, place, and time.     ED Results / Procedures / Treatments   Labs (all labs ordered are listed, but only abnormal results are displayed) Labs Reviewed  CBC WITH DIFFERENTIAL/PLATELET - Abnormal; Notable for the following components:  Result Value   Neutro Abs 8.5 (*)    Lymphs Abs 0.5 (*)    All other components within normal limits  COMPREHENSIVE METABOLIC PANEL - Abnormal; Notable for the following components:   Potassium 2.7 (*)    Chloride 96 (*)    Glucose, Bld 143 (*)    Alkaline Phosphatase 222 (*)    Total Bilirubin 1.5 (*)    Anion gap 16 (*)    All other components within normal limits  PROTIME-INR  TYPE AND SCREEN    EKG None  Radiology DG Chest 1 View  Result Date: 06/28/2022 CLINICAL DATA:  86 year old female with history of fall complaining of right hip pain. EXAM: CHEST  1 VIEW COMPARISON:  Chest x-ray 05/20/2022. FINDINGS: Postoperative changes of right upper lobectomy are again noted. No acute consolidative airspace disease. No pleural effusions. No pneumothorax. No evidence of pulmonary edema. Heart size is normal. Upper mediastinal contours are within normal limits. Contour abnormality in  the lower aspect of the right lateral chest wall with destructive changes in the overlying ribs (likely at least the lateral right sixth rib), similar to the recent prior study. IMPRESSION: 1. Abnormal contour of the right lateral chest wall with apparent destructive changes in the lateral right sixth rib. Findings remain concerning for potential metastatic lesion, and correlation with contrast-enhanced chest CT is once again recommended. 2. Status post right upper lobectomy. Electronically Signed   By: Vinnie Langton M.D.   On: 06/20/2022 06:59   DG Hip Unilat With Pelvis 2-3 Views Right  Result Date: 06/08/2022 CLINICAL DATA:  86 year old female with history of fall complaining of right hip pain. EXAM: DG HIP (WITH OR WITHOUT PELVIS) 2-3V RIGHT COMPARISON:  No priors. FINDINGS: Three views of the right hip demonstrate an acute transcervical femoral neck fracture in the distal aspect of the femoral neck adjacent to the intertrochanteric region. This is displaced with varus angulation, the degree of which is difficult to judge on the nonstandard views provided. Femoral head remains located in the acetabulum. No definite acute displaced fractures of the bony pelvic ring or the visualized portions of the left proximal femur. Joint space narrowing, subchondral sclerosis, subchondral cyst formation and osteophyte formation is noted in the hip joints bilaterally, indicative of moderate bilateral osteoarthritis. IMPRESSION: 1. Limited examination demonstrating an acute displaced and angulated transcervical right femoral neck fracture, as above. 2. Moderate bilateral hip joint osteoarthritis. Electronically Signed   By: Vinnie Langton M.D.   On: 06/27/2022 06:55    Procedures Procedures    Medications Ordered in ED Medications  HYDROmorphone (DILAUDID) injection 1 mg (1 mg Intravenous Given 06/23/2022 0611)  HYDROmorphone (DILAUDID) injection 2 mg (2 mg Intravenous Given 06/05/2022 0734)  ondansetron (ZOFRAN)  injection 4 mg (4 mg Intravenous Given 06/25/2022 9563)    ED Course/ Medical Decision Making/ A&P                           Medical Decision Making Amount and/or Complexity of Data Reviewed Labs: ordered. Radiology: ordered.  Risk Prescription drug management. Decision regarding hospitalization.   Patient with fall.  Mechanical.  Unfortunately does have some serious disease at baseline.  Just got off hospice.  Right hip pain.  Differential for the deformity does include femur fracture, hip fracture, hip dislocation.  I have independently interpreted the x-rays and it looks as if there is a femoral neck fracture on the right side.  Also has a hypokalemia 2.7.  Will require admission to the hospital.  Will discuss with Ortho, from [facility] and give them first chance.  And will require admission to hospital.        Final Clinical Impression(s) / ED Diagnoses Final diagnoses:  Fall, initial encounter  Closed fracture of right hip, initial encounter [facility])  Metastatic malignant neoplasm, unspecified site [facility])    Rx / DC Orders ED Discharge Orders     None         Davonna Belling, MD 06/04/2022 [phone]

## 2022-06-13 NOTE — Consult Note (Signed)
Consultation Note Date: 06/07/2022   Patient Name: Maria [name]  DOB: [birth year]  MRN: [id number]  Age / Sex: 86 y.o., female  PCP: Charlane Ferretti, MD Referring Physician: Karmen Bongo, MD  Reason for Consultation: {Reason for Consult:23484}  HPI/Patient Profile: 86 y.o. female  with past medical history of *** admitted on 06/22/2022 with ***.    PMT has been consulted to assist with goals of care conversation.  Clinical Assessment and Goals of Care:  I have reviewed medical records including EPIC notes, labs and imaging, ***, assessed the patient and then *** with *** to discuss diagnosis prognosis, GOC, EOL wishes, disposition and options.  I introduced Palliative Medicine as specialized medical care for people living with serious illness. It focuses on providing relief from the symptoms and stress of a serious illness. The goal is to improve quality of life for both the patient and the family.  We discussed a brief life review of the patient and then focused on their current illness. ***  I attempted to elicit values and goals of care important to the patient.    Medical History Review and Understanding:  ***  Social History: ***  Functional and Nutritional State: ***  Palliative Symptoms: ***  Advance Directives: A detailed discussion regarding advanced directives was had. ***   Code Status: ***  Discussion: ***  The difference between aggressive medical intervention and comfort care was considered in light of the patient's goals of care. Hospice and Palliative Care services outpatient were explained and offered.   Discussed the importance of continued conversation with family and the medical providers regarding overall plan of care and treatment options, ensuring decisions are within the context of the patient's values and GOCs.   Questions and concerns were addressed.  Hard  Choices booklet left for review. The family was encouraged to call with questions or concerns.  PMT will continue to support holistically.   {Primary Decision XFGHW:29937}    SUMMARY OF RECOMMENDATIONS   *** Code Status/Advance Care Planning: {Palliative Code status:23503}   Symptom Management:  ***  Palliative Prophylaxis:  {Palliative Prophylaxis:21015}  Additional Recommendations (Limitations, Scope, Preferences): {Recommended Scope and Preferences:21019}  Psycho-social/Spiritual:  Desire for further Chaplaincy support:{YES NO:22349} Additional Recommendations: {PAL SOCIAL:21064}  Prognosis:  {Palliative Care Prognosis:23504}  Discharge Planning: {Palliative dispostion:23505}      Primary Diagnoses: Present on Admission:  Hip fracture ([place])  High grade ovarian cancer (HCC)  Hypokalemia  DNR (do not resuscitate)    Physical Exam  Vital Signs: BP 138/80   Pulse (!) 108   Temp 98.2 F (36.8 C) (Oral)   Resp 12   Ht 5\' 5"  (1.651 m)   Wt 64.3 kg   SpO2 93%   BMI 23.60 kg/m  Pain Scale: 0-10   Pain Score: 8    SpO2: SpO2: 93 % O2 Device:SpO2: 93 % O2 Flow Rate: .    Palliative Assessment/Data:      Total time: I spent *** minutes in the care of the patient today in the above activities and documenting the encounter.  MDM:  Medical Decision Making: #/Complex Problems:                   Data Reviewed:              Management:   Oralia Rud  Palliative Medicine Team Team phone # [phone]  Thank you for allowing the Palliative Medicine Team to assist in the care of this patient. Please utilize  secure chat with additional questions, if there is no response within 30 minutes please call the above phone number.  Palliative Medicine Team providers are available by phone from 7am to 7pm daily and can be reached through the team cell phone.  Should this patient require assistance outside of these hours, please call the  patient's attending physician.

## 2022-06-13 NOTE — Progress Notes (Signed)
[facility] ED [facility]) Hospice hospital liaison note  Informed my ED physician of results of MRI and families request for [facility].   Clinical information under review to determine eligibility.   Unfortunately [employer] [facility] does not have any beds available today.   Please don't hesitate to call with any hospice related questions or concerns.  Jhonnie Garner, BSN, [employer] hospital liaison [phone]

## 2022-06-13 NOTE — H&P (Signed)
History and Physical    Patient: Maria [name] EZM:[id number] DOB: 1936/10/07 DOA: 06/19/2022 DOS: the patient was seen and examined on 06/11/2022 PCP: Charlane Ferretti, MD  Patient coming from: Home - lives alone; NOK: [place], [place], [phone]   Chief Complaint: Fall  HPI: Maria [name] is a 86 y.o. female with medical history significant of stage 4 ovarian CA on hospice; HLD; chronic pain; OSA on CPAP; hemachromatosis; and CAD presenting with a fall.  She was last admitted from 7/19-22 for FTT in the setting of stage IV ovarian cancer with bone mets including skull.  She was discharged to residential hospice but did well and so was discharged back to home.  She ambulated with a walker to her PCP yesterday.  Her family has been providing 24/7 support at home.  She went to bed last night and attempted to get up to the bathroom without the walker.  She fell, resulting in a R hip fracture.  The patient is very uncertain about whether to proceed with operative repair.  She is clear that she is DNR and would not accept blood products (or even blood typing).  She is considering the risks/benefits of surgery.  Family acknowledges that she tends to procrastinate/be indecisive and they are a bit concerned about her ability to make decisions.  They are working with her on it.  In the meantime, she clearly does want to eat/drink and so will delay surgery for today, at least.    ER Course:  Discharged from hospice.  [place], has femoral neck fracture.  Ortho consulted.     Review of Systems: As mentioned in the history of present illness. All other systems reviewed and are negative. Past Medical History:  Diagnosis Date   Cataract    Complication of anesthesia    slow to awaken   Depression    Diverticulosis    Goiter, nodular    [place] of thyroid stable 7/08   Headache    hx of   Heart murmur    due to rheumatic fever as child   Hemochromatosis, hereditary ([place]) 09/01/2019   Mitral  valve prolapse    Multiple thyroid nodules 11/24/2015   Neoplasm of lung, malignant ([place]) 2002   Non-small cell surgical tx   Osteoarthritis 11/14/2013   2010 intra-articular steroids X 3 S/P Physical Therapy in 12/14 TKR 12/11/13    Osteopenia    Dr Pamala Hurry   PONV (postoperative nausea and vomiting)    n/v   Sleep apnea    Vitamin D deficiency    Past Surgical History:  Procedure Laterality Date   ABDOMINAL HYSTERECTOMY  1998   No BSO, for dysfunctional menses   BLADDER SUSPENSION Bilateral    BREAST BIOPSY Right    COLONOSCOPY  2009 , 2014   Diverticulosis; Dr. Olevia Perches   LOBECTOMY  12/02   RU; no radiation or chemo, Dr. Arlyce Dice   TONSILLECTOMY     TOTAL KNEE ARTHROPLASTY Right 12/11/2013   Procedure: RIGHT TOTAL KNEE ARTHROPLASTY;  Surgeon: Gearlean Alf, MD;  Location: [facility] ORS;  Service: Orthopedics;  Laterality: Right;   TOTAL KNEE ARTHROPLASTY Left 06/28/2017   Procedure: LEFT TOTAL KNEE ARTHROPLASTY;  Surgeon: Gaynelle Arabian, MD;  Location: [facility] ORS;  Service: Orthopedics;  Laterality: Left;  Adductor Block   Social History:  reports that she quit smoking about 43 years ago. Her smoking use included cigarettes. She has a 5.00 pack-year smoking history. She has never used smokeless tobacco. She reports current  alcohol use of about 7.0 standard drinks of alcohol per week. She reports that she does not use drugs.  No Known Allergies  Family History  Problem Relation Age of Onset   COPD Mother    Coronary artery disease Father    Colon cancer Father 48   Heart attack Father 63   Stroke Father        > 84   Lung cancer Paternal Aunt        smoker   Cancer Paternal Aunt        lung   Lung cancer Maternal Grandfather        smoker   COPD Maternal Grandfather    Arthritis Sister    Lung cancer Maternal Uncle        smoker   Diabetes Neg Hx     Prior to Admission medications   Medication Sig Start Date End Date Taking? Authorizing Provider  DULoxetine (CYMBALTA) 30 MG  capsule TAKE 1 CAPSULE BY MOUTH EVERY DAY Patient not taking: Reported on 05/20/2022 05/10/22   Janith Lima, MD  nystatin (MYCOSTATIN) 100000 UNIT/ML suspension Take 5 mLs by mouth 4 (four) times daily. Swish and swallow 05/16/22   [provider]  Oxycodone HCl 10 MG TABS Take 10-20 mg by mouth See admin instructions. Take 2 tablets (20 mg) by mouth every morning and night; take 1 tablet (10 mg) midday 04/29/22   [provider]    Physical Exam: Vitals:   06/22/2022 0540 06/20/2022 0546 06/17/2022 0614  BP:   (!) 155/88  Pulse:   96  Resp:   16  Temp:   98.3 F (36.8 C)  TempSrc:   Oral  SpO2: 98%  94%  Weight:  64.3 kg   Height:  5\' 5"  (1.651 m)    General:  Appears calm and comfortable and is in NAD; hip pain with movement Eyes:   EOMI, normal lids, iris ENT:   grossly normal but dry lips & tongue, moderately dry mm Neck:  no LAD, masses or thyromegaly Cardiovascular:  RRR, no m/r/g. No LE edema.  Respiratory:   CTA bilaterally with no wheezes/rales/rhonchi.  Normal respiratory effort.   Abdomen:  soft, NT, ND Skin:  no rash or induration seen on limited exam Musculoskeletal:  R leg is shortened and externally rotated Lower extremity:  No LE edema.  Limited foot exam with no ulcerations.  2+ distal pulses. Psychiatric:  grossly normal mood and affect, speech fluent and appropriate, appears to have capacity Neurologic:  CN 2-12 grossly intact, moves all extremities in coordinated fashion other than RLE   Radiological Exams on Admission: Independently reviewed - see discussion in A/P where applicable  DG Chest 1 View  Result Date: 07/01/2022 CLINICAL DATA:  86 year old female with history of fall complaining of right hip pain. EXAM: CHEST  1 VIEW COMPARISON:  Chest x-ray 05/20/2022. FINDINGS: Postoperative changes of right upper lobectomy are again noted. No acute consolidative airspace disease. No pleural effusions. No pneumothorax. No evidence of pulmonary edema.  Heart size is normal. Upper mediastinal contours are within normal limits. Contour abnormality in the lower aspect of the right lateral chest wall with destructive changes in the overlying ribs (likely at least the lateral right sixth rib), similar to the recent prior study. IMPRESSION: 1. Abnormal contour of the right lateral chest wall with apparent destructive changes in the lateral right sixth rib. Findings remain concerning for potential metastatic lesion, and correlation with contrast-enhanced chest CT is once again  recommended. 2. Status post right upper lobectomy. Electronically Signed   By: Vinnie Langton M.D.   On: 06/30/2022 06:59   DG Hip Unilat With Pelvis 2-3 Views Right  Result Date: 06/17/2022 CLINICAL DATA:  86 year old female with history of fall complaining of right hip pain. EXAM: DG HIP (WITH OR WITHOUT PELVIS) 2-3V RIGHT COMPARISON:  No priors. FINDINGS: Three views of the right hip demonstrate an acute transcervical femoral neck fracture in the distal aspect of the femoral neck adjacent to the intertrochanteric region. This is displaced with varus angulation, the degree of which is difficult to judge on the nonstandard views provided. Femoral head remains located in the acetabulum. No definite acute displaced fractures of the bony pelvic ring or the visualized portions of the left proximal femur. Joint space narrowing, subchondral sclerosis, subchondral cyst formation and osteophyte formation is noted in the hip joints bilaterally, indicative of moderate bilateral osteoarthritis. IMPRESSION: 1. Limited examination demonstrating an acute displaced and angulated transcervical right femoral neck fracture, as above. 2. Moderate bilateral hip joint osteoarthritis. Electronically Signed   By: Vinnie Langton M.D.   On: 06/05/2022 06:55    EKG: Independently reviewed.  Sinus tachcyardia with rate 101; prolonged QTc 500; LVH with IVCD; nonspecific ST changes with no evidence of acute  ischemia   Labs on Admission: I have personally reviewed the available labs and imaging studies at the time of the admission.  Pertinent labs:    K+ 2.7 Glucose 143 Anion gap 16 AP 222 Bili 1.5 Normal CBC INR 1.0   Assessment and Plan: Principal Problem:   Hip fracture (HCC) Active Problems:   Hypokalemia   High grade ovarian cancer (HCC)   DNR (do not resuscitate)    Hip fracture -Apparently mechanical fall resulting in hip fracture - although pathologic fracture in the setting of metastatic ovarian cancer is a consideration -Orthopedics consulted by ER but this is on hold for now given her uncertainty about the procedure -SCDs overnight, start Lovenox post-operatively (or as per ortho) -Pain control with Tylenol, Robaxin, Oxycodone, and Morphine prn -She would have to relinquish hospice in order to go to SNF rehab post-operatively -Hip fracture order set utilized -TXA per orthopedics -Fascia iliacus block not ordered at this time -Given the possibility of pathologic fracture, ortho has ordered CT and MRI - but the patient may decide to decline these studies  Pre-operative stratification -Orthopedic/spinal surgery is associated with an intermediate (1-5%) cardiovascular risk for cardiac death and nonfatal MI -Her revised cardiac index gives a risk estimate of 3.9% -It is reasonable for her to go to the OR without additional evaluation, should she decide to do so  Stage IV ovarian cancer -Metastatic high-grade serous ovarian CA diagnosed in 03/2021 -Has mets to lymph nodes, liver, bone (ribs, spine, calvarium), likely lung -s/p BSO, infragastric omentectomy, appy, and primary tumor debulking + chemo -Completed palliative radiation therapy on 7/5 for thoracic and lumbar spine mets in an attempt to preserve her ability to ambulate -She was recently admitted for FTT and was discharged to residential hospice -She did well enough that she was discharged to home again -Given  her advanced metastatic disease, it would be reasonable to transition to comfort only should that be her option -I have reached out to [facility] and they will further discuss with the patient/family -Will continue home Oxy and Ativan in addition to acute pain meds as above   Hypokalemia -Also present during last hospitalization -Repleted with 40 mEq KCl BID x 2  doses  -Will follow.   -Will check Mag level.     OSA -No longer wearing CPAP at night  DNR -I have discussed code status with the patient and her family and they are in agreement that the patient would not desire resuscitation and would prefer to die a natural death should that situation arise. -She will need a gold out of facility DNR form at the time of discharge      Advance Care Planning:   Code Status: DNR   Consults: Orthopedics (telephone only so far); hospice; TOC team  DVT Prophylaxis: SCDs  Family Communication: Sons and DIL were present throughout evaluation  Severity of Illness: The appropriate patient status for this patient is INPATIENT. Inpatient status is judged to be reasonable and necessary in order to provide the required intensity of service to ensure the patient's safety. The patient's presenting symptoms, physical exam findings, and initial radiographic and laboratory data in the context of their chronic comorbidities is felt to place them at high risk for further clinical deterioration. Furthermore, it is not anticipated that the patient will be medically stable for discharge from the hospital within 2 midnights of admission.   * I certify that at the point of admission it is my clinical judgment that the patient will require inpatient hospital care spanning beyond 2 midnights from the point of admission due to high intensity of service, high risk for further deterioration and high frequency of surveillance required.*  Author: Karmen Bongo, MD 07/02/2022 10:55 AM  For on call review  [email or web address].

## 2022-06-13 NOTE — ED Notes (Signed)
ED TO INPATIENT HANDOFF REPORT  ED Nurse Name and Phone #: Maria [name] [phone]  S Name/Age/Gender [name] 86 y.o. female Room/Bed: 044C/044C  Code Status   Code Status: DNR  Home/SNF/Other Home Patient oriented to: self, place, time, and situation Is this baseline? Yes   Triage Complete: Triage complete  Chief Complaint Hip fracture ([place]) [S72.009A]  Triage Note [facility]IB [facility] from home unknown down time. C/o pain and deformity to rt hip, leg is short and rotated. Pt is A &Oxd4, ambulates with walker. Lost balance and feels, denies LOC. PIV 20ga LTAC, Fentanyl 43mcg.   Allergies No Known Allergies  Level of Care/Admitting Diagnosis ED Disposition     ED Disposition  Admit   Condition  --   Comment  Hospital Area: [facility] [[id number]]  Level of Care: Palliative Care [15]  May admit patient to Maria [name] or Maria [name] if equivalent level of care is available:: Yes  Covid Evaluation: Asymptomatic - no recent exposure (last 10 days) testing not required  Diagnosis: Hip fracture [facility]) [[id number]]  Admitting Physician: Karmen Bongo [[id number]]  Attending Physician: Karmen Bongo [[id number]]  Certification:: I certify this patient will need inpatient services for at least 2 midnights          B Medical/Surgery History Past Medical History:  Diagnosis Date   Cataract    Complication of anesthesia    slow to awaken   Depression    Diverticulosis    Goiter, nodular    [place] of thyroid stable 7/08   Headache    hx of   Heart murmur    due to rheumatic fever as child   Hemochromatosis, hereditary ([place]) 09/01/2019   Mitral valve prolapse    Multiple thyroid nodules 11/24/2015   Neoplasm of lung, malignant ([place]) 2002   Non-small cell surgical tx   Osteoarthritis 11/14/2013   2010 intra-articular steroids X 3 S/P Physical Therapy in 12/14 TKR 12/11/13    Osteopenia    Dr Pamala Hurry   PONV (postoperative nausea and vomiting)    n/v   Sleep apnea     Vitamin D deficiency    Past Surgical History:  Procedure Laterality Date   ABDOMINAL HYSTERECTOMY  1998   No BSO, for dysfunctional menses   BLADDER SUSPENSION Bilateral    BREAST BIOPSY Right    COLONOSCOPY  2009 , 2014   Diverticulosis; Dr. Olevia Perches   LOBECTOMY  12/02   RU; no radiation or chemo, Dr. Arlyce Dice   TONSILLECTOMY     TOTAL KNEE ARTHROPLASTY Right 12/11/2013   Procedure: RIGHT TOTAL KNEE ARTHROPLASTY;  Surgeon: Gearlean Alf, MD;  Location: [facility];  Service: Orthopedics;  Laterality: Right;   TOTAL KNEE ARTHROPLASTY Left 06/28/2017   Procedure: LEFT TOTAL KNEE ARTHROPLASTY;  Surgeon: Gaynelle Arabian, MD;  Location: [facility];  Service: Orthopedics;  Laterality: Left;  Adductor Block     A IV Location/Drains/Wounds Patient Lines/Drains/Airways Status     Active Line/Drains/Airways     Name Placement date Placement time Site Days   Peripheral IV 05/21/22 22 G 1" Right Hand 05/21/22  1857  Hand  23   Peripheral IV 06/07/2022 20 G Left Antecubital 06/04/2022  0541  Antecubital  less than 1   Pressure Injury 05/20/22 Coccyx Mid Stage 2 -  Partial thickness loss of dermis presenting as a shallow open injury with a red, pink wound bed without slough. slightly abraded 0.5cm oval area within nonblanchable region and blanchable skin 05/20/22  2100  --  24            Intake/Output Last 24 hours No intake or output data in the 24 hours ending 06/17/2022 1532  Labs/Imaging Results for orders placed or performed during the hospital encounter of 06/11/2022 (from the past 48 hour(s))  CBC with Differential     Status: Abnormal   Collection Time: 06/19/2022  5:58 AM  Result Value Ref Range   WBC 10.1 4.0 - 10.5 K/uL   RBC 4.78 3.87 - 5.11 MIL/uL   Hemoglobin 14.8 12.0 - 15.0 g/dL   HCT 42.6 36.0 - 46.0 %   MCV 89.1 80.0 - 100.0 fL   MCH 31.0 26.0 - 34.0 pg   MCHC 34.7 30.0 - 36.0 g/dL   RDW 12.6 11.5 - 15.5 %   Platelets 220 150 - 400 K/uL   nRBC 0.0 0.0 - 0.2 %   Neutrophils Relative  % 84 %   Neutro Abs 8.5 (H) 1.7 - 7.7 K/uL   Lymphocytes Relative 5 %   Lymphs Abs 0.5 (L) 0.7 - 4.0 K/uL   Monocytes Relative 10 %   Monocytes Absolute 1.0 0.1 - 1.0 K/uL   Eosinophils Relative 0 %   Eosinophils Absolute 0.0 0.0 - 0.5 K/uL   Basophils Relative 0 %   Basophils Absolute 0.0 0.0 - 0.1 K/uL   Immature Granulocytes 1 %   Abs Immature Granulocytes 0.06 0.00 - 0.07 K/uL    Comment: Performed at [facility], [address], [place], [place] [address]  Protime-INR     Status: None   Collection Time: 07/01/2022  5:58 AM  Result Value Ref Range   Prothrombin Time 13.2 11.4 - 15.2 seconds   INR 1.0 0.8 - 1.2    Comment: (NOTE) INR goal varies based on device and disease states. Performed at [facility], [place] [address] [place], [place] [address]   Type and screen [place]     Status: None   Collection Time: 06/23/2022  5:58 AM  Result Value Ref Range   ABO/RH(D) O POS    Antibody Screen NEG    Sample Expiration      06/16/2022,2359 Performed at [facility], [place] [address], [place],  [address]   Comprehensive metabolic panel     Status: Abnormal   Collection Time: 06/16/2022  5:58 AM  Result Value Ref Range   Sodium 137 135 - 145 mmol/L   Potassium 2.7 (LL) 3.5 - 5.1 mmol/L    Comment: CRITICAL RESULT CALLED TO, READ BACK BY AND VERIFIED WITH MOON K,RN 06/06/2022 0642 WAYK   Chloride 96 (L) 98 - 111 mmol/L   CO2 25 22 - 32 mmol/L   Glucose, Bld 143 (H) 70 - 99 mg/dL    Comment: Glucose reference range applies only to samples taken after fasting for at least 8 hours.   BUN 11 8 - 23 mg/dL   Creatinine, Ser 0.78 0.44 - 1.00 mg/dL   Calcium 9.3 8.9 - 10.3 mg/dL   Total Protein 6.8 6.5 - 8.1 g/dL   Albumin 3.5 3.5 - 5.0 g/dL   AST 40 15 - 41 U/L   ALT 18 0 - 44 U/L   Alkaline Phosphatase 222 (H) 38 - 126 U/L   Total Bilirubin 1.5 (H) 0.3 - 1.2 mg/dL   GFR, Estimated >60 >60 mL/min    Comment: (NOTE) Calculated using the  CKD-EPI Creatinine Equation (2021)    Anion gap 16 (H)  5 - 15    Comment: Performed at [facility], [place] [address], [place], [place] [address]   CT HIP RIGHT WO CONTRAST  Result Date: 06/15/2022 CLINICAL DATA:  Right hip fracture EXAM: CT OF THE RIGHT HIP WITHOUT CONTRAST TECHNIQUE: Multidetector CT imaging of the right hip was performed according to the standard protocol. Multiplanar CT image reconstructions were also generated. RADIATION DOSE REDUCTION: This exam was performed according to the departmental dose-optimization program which includes automated exposure control, adjustment of the mA and/or kV according to patient size and/or use of iterative reconstruction technique. COMPARISON:  Same day radiograph and MRI FINDINGS: Bones/Joint/Cartilage Large lytic lesion centered within the right humeral neck and intertrochanteric region measuring approximately 4.0 x 2.6 x 3.3 cm (series 4, image 81) with acute pathologic fracture with mild anterior apex angulation. Numerous lytic lesions throughout the imaged pelvis and within the proximal right femoral diaphysis, seen to better advantage on concurrently obtained MRI. Cortical thinning and breakthrough of numerous lesions including right parasymphyseal pubic lesion (series 4, image 82) and posterior right acetabular lesion (series 4, image 73). Although no additional traumatic fracture is seen. Ligaments Suboptimally assessed by CT. Muscles and Tendons No acute musculotendinous abnormality by CT. Soft tissues Mixed solid and cystic mass in the low left pelvis measuring approximately 4.8 x 3.0 cm (series 8, image 62). Sigmoid diverticulosis. Aortoiliac atherosclerosis. IMPRESSION: 1. Large lytic lesion centered within the right humeral neck and intertrochanteric region measuring up to 4.0 cm with acute pathologic fracture with mild anterior apex angulation. 2. Numerous lytic lesions throughout the imaged pelvis and within the proximal right femoral  diaphysis, seen to better advantage on concurrently obtained MRI. 3. Mixed solid and cystic mass in the low left pelvis measuring approximately 4.8 x 3.0 cm. Aortic Atherosclerosis (ICD10-I70.0). Electronically Signed   By: Davina Poke D.O.   On: 06/20/2022 12:42   MR FEMUR RIGHT W WO CONTRAST  Result Date: 07/01/2022 CLINICAL DATA:  Fall.  Right hip fracture.  History of cancer EXAM: MRI OF THE RIGHT FEMUR WITHOUT AND WITH CONTRAST TECHNIQUE: Multiplanar, multisequence MR imaging of the right femur was performed both before and after administration of intravenous contrast. CONTRAST:  81mL GADAVIST GADOBUTROL 1 MMOL/ML IV SOLN COMPARISON:  X-ray 06/16/2022 FINDINGS: Bones/Joint/Cartilage Widespread marrow replacing lesions throughout the bilateral femurs, right worse than left, and throughout the visualized pelvis. Large metastatic lesion involving the right femoral neck and intertrochanteric region measuring approximately 4.4 x 2.4 x 3.6 cm with acute pathologic fracture. Mild anterior apex angulation at the fracture site. Prominent edema and enhancement within the soft tissues anterior and medial to the fracture site which may be posttraumatic or represent extraosseous tumor infiltration. Additional reference lesions as follows: 3.4 cm proximal right femoral diaphyseal lesion (series 4, image 21); 2.0 cm right anterior acetabular lesion (series 4, image 27); 2.4 cm right posterior acetabular lesion (series 4, image 19); and 3.5 cm left femoral intertrochanteric lesion (series 4, image 18). No additional acute pathologic fractures are identified. Bilateral hip joints intact without dislocation. Susceptibility artifact from bilateral knee prostheses. Ligaments Grossly intact. Muscles and Tendons Subtle edema and enhancement within the right vastus intermedius muscle adjacent to the proximal right femoral diaphyseal lesion which could be reactive or represent tumor infiltration (series 6, image 30). No acute  tendinous abnormality is evident. Soft tissues Partially imaged solid and cystic mass in the low left hemipelvis measuring approximately 4.7 x 3.4 cm may represent ovarian neoplasm seen on previous abdominopelvic CT  from 02/28/2021. This finding is not well characterized on today's examination centered at the right femur. IMPRESSION: 1. Widespread marrow replacing lesions throughout the bilateral femurs, right worse than left, and throughout the visualized pelvis. Findings are compatible with metastatic disease given history of cancer. 2. Large metastatic lesion involving the right femoral neck and intertrochanteric region measuring approximately 4.4 x 2.4 x 3.6 cm with acute pathologic fracture. Prominent edema and enhancement within the soft tissues anterior and medial to the fracture site which may be posttraumatic or represent extraosseous tumor infiltration. 3. Subtle edema and enhancement within the right vastus intermedius muscle adjacent to the proximal right femoral diaphyseal lesion which could be reactive or represent tumor infiltration. 4. Partially imaged 4.7 cm solid and cystic mass in the low left hemipelvis may represent ovarian neoplasm seen on previous abdominopelvic CT from 02/28/2021. This finding is not well characterized on today's examination centered at the right femur. Electronically Signed   By: Davina Poke D.O.   On: 06/03/2022 12:16   DG Chest 1 View  Result Date: 06/15/2022 CLINICAL DATA:  86 year old female with history of fall complaining of right hip pain. EXAM: CHEST  1 VIEW COMPARISON:  Chest x-ray 05/20/2022. FINDINGS: Postoperative changes of right upper lobectomy are again noted. No acute consolidative airspace disease. No pleural effusions. No pneumothorax. No evidence of pulmonary edema. Heart size is normal. Upper mediastinal contours are within normal limits. Contour abnormality in the lower aspect of the right lateral chest wall with destructive changes in the  overlying ribs (likely at least the lateral right sixth rib), similar to the recent prior study. IMPRESSION: 1. Abnormal contour of the right lateral chest wall with apparent destructive changes in the lateral right sixth rib. Findings remain concerning for potential metastatic lesion, and correlation with contrast-enhanced chest CT is once again recommended. 2. Status post right upper lobectomy. Electronically Signed   By: Vinnie Langton M.D.   On: 06/06/2022 06:59   DG Hip Unilat With Pelvis 2-3 Views Right  Result Date: 06/21/2022 CLINICAL DATA:  86 year old female with history of fall complaining of right hip pain. EXAM: DG HIP (WITH OR WITHOUT PELVIS) 2-3V RIGHT COMPARISON:  No priors. FINDINGS: Three views of the right hip demonstrate an acute transcervical femoral neck fracture in the distal aspect of the femoral neck adjacent to the intertrochanteric region. This is displaced with varus angulation, the degree of which is difficult to judge on the nonstandard views provided. Femoral head remains located in the acetabulum. No definite acute displaced fractures of the bony pelvic ring or the visualized portions of the left proximal femur. Joint space narrowing, subchondral sclerosis, subchondral cyst formation and osteophyte formation is noted in the hip joints bilaterally, indicative of moderate bilateral osteoarthritis. IMPRESSION: 1. Limited examination demonstrating an acute displaced and angulated transcervical right femoral neck fracture, as above. 2. Moderate bilateral hip joint osteoarthritis. Electronically Signed   By: Vinnie Langton M.D.   On: 07/01/2022 06:55    Pending Labs Unresulted Labs (From admission, onward)    None       Vitals/Pain Today's Vitals   06/10/2022 0614 06/27/2022 0733 06/30/2022 0831 06/10/2022 1242  BP: (!) 155/88   131/80  Pulse: 96   (!) 106  Resp: 16   15  Temp: 98.3 F (36.8 C)   98.6 F (37 C)  TempSrc: Oral   Oral  SpO2: 94%   95%  Weight:       Height:      PainSc:  10-Worst pain ever 6      Isolation Precautions No active isolations  Medications Medications  methocarbamol (ROBAXIN) tablet 500 mg (has no administration in time range)    Or  methocarbamol (ROBAXIN) 500 mg in dextrose 5 % 50 mL IVPB (has no administration in time range)  polyethylene glycol (MIRALAX / GLYCOLAX) packet 17 g (has no administration in time range)  bisacodyl (DULCOLAX) EC tablet 5 mg (has no administration in time range)  oxyCODONE (Oxy IR/ROXICODONE) immediate release tablet 5-10 mg (has no administration in time range)  acetaminophen (TYLENOL) tablet 650 mg (has no administration in time range)    Or  acetaminophen (TYLENOL) suppository 650 mg (has no administration in time range)  morphine bolus via infusion 2 mg (has no administration in time range)  morphine 100mg  in NS 177mL (1mg /mL) infusion - premix (has no administration in time range)  LORazepam (ATIVAN) tablet 1 mg (has no administration in time range)    Or  LORazepam (ATIVAN) 2 MG/ML concentrated solution 1 mg (has no administration in time range)    Or  LORazepam (ATIVAN) injection 1 mg (has no administration in time range)  haloperidol (HALDOL) tablet 0.5 mg (has no administration in time range)    Or  haloperidol (HALDOL) 2 MG/ML solution 0.5 mg (has no administration in time range)    Or  haloperidol lactate (HALDOL) injection 0.5 mg (has no administration in time range)  diphenhydrAMINE (BENADRYL) injection 12.5 mg (has no administration in time range)  ondansetron (ZOFRAN-ODT) disintegrating tablet 4 mg (has no administration in time range)    Or  ondansetron (ZOFRAN) injection 4 mg (has no administration in time range)  glycopyrrolate (ROBINUL) tablet 1 mg (has no administration in time range)    Or  glycopyrrolate (ROBINUL) injection 0.2 mg (has no administration in time range)    Or  glycopyrrolate (ROBINUL) injection 0.2 mg (has no administration in time range)   antiseptic oral rinse (BIOTENE) solution 15 mL (has no administration in time range)  polyvinyl alcohol (LIQUIFILM TEARS) 1.4 % ophthalmic solution 1 drop (has no administration in time range)  HYDROmorphone (DILAUDID) injection 1 mg (1 mg Intravenous Given 06/20/2022 0611)  HYDROmorphone (DILAUDID) injection 2 mg (2 mg Intravenous Given 06/26/2022 0734)  ondansetron (ZOFRAN) injection 4 mg (4 mg Intravenous Given 06/30/2022 0733)  gadobutrol (GADAVIST) 1 MMOL/ML injection 6 mL (6 mLs Intravenous Contrast Given 06/12/2022 1129)    Mobility non-ambulatory High fall risk      R Recommendations: See Admitting Provider Note  Report given to:   Additional Notes:

## 2022-06-13 NOTE — Progress Notes (Signed)
Manufacturing engineer [facility])       This patient is a current hospice patient with [facility], admitted with a terminal diagnosis Carcinoma of the ovary.  MSW spoke with son/Chris and he reports that patient is being transported to MRI. Family is continuing to having [place] conversations with patient as they are unsure if patient is a candidate for any invasive treatments.    [facility] will continue to follow for any discharge planning needs and to coordinate continuation of hospice care.     Please call with any questions/concerns.    Thank you for the opportunity to participate in this patient's care.  Daphene Calamity, MSW [facility] Liaison  [phone]

## 2022-06-13 NOTE — ED Triage Notes (Signed)
BIB [facility] from home unknown down time. C/o pain and deformity to rt hip, leg is short and rotated. Pt is A &Oxd4, ambulates with walker. Lost balance and feels, denies LOC. PIV 20ga LTAC, Fentanyl 14mcg.

## 2022-06-13 NOTE — Plan of Care (Signed)
  Problem: Pain Managment: Goal: General experience of comfort will improve Outcome: Progressing   Problem: Safety: Goal: Ability to remain free from injury will improve Outcome: Progressing   Problem: Nutrition: Goal: Adequate nutrition will be maintained Outcome: Not Progressing

## 2022-06-13 NOTE — ED Notes (Signed)
Patient states she does not want a blood transfusion for any reason.

## 2022-06-14 DIAGNOSIS — S72001A Fracture of unspecified part of neck of right femur, initial encounter for closed fracture: Secondary | ICD-10-CM | POA: Diagnosis not present

## 2022-06-14 DIAGNOSIS — C799 Secondary malignant neoplasm of unspecified site: Secondary | ICD-10-CM

## 2022-06-14 DIAGNOSIS — Z66 Do not resuscitate: Secondary | ICD-10-CM | POA: Diagnosis not present

## 2022-06-14 DIAGNOSIS — C569 Malignant neoplasm of unspecified ovary: Secondary | ICD-10-CM | POA: Diagnosis not present

## 2022-06-14 NOTE — Progress Notes (Signed)
Daily Progress Note   Patient Name: Maria [name]       Date: 06/14/2022 DOB: [birth year]  Age: 86 y.o. MRN#: [id number] Attending Physician: Geradine Girt, DO Primary Care Physician: Charlane Ferretti, MD Admit Date: 06/20/2022  Reason for Consultation/Follow-up: Terminal Care  Subjective: Medical records reviewed including progress notes, MAR.  Noted morphine drip was increased to 7 mL/hour overnight.  Patient assessed at the bedside.  She is unresponsive, open mouth breathing with regular rate and no acute distress.  Discussed with RN.  She has 3 friends visiting at the bedside.  Provided updates on my conversation with patient yesterday evening, at which time she expressed her wishes for absolutely no pain even if this meant she would become sleepier.  Emotional support and therapeutic listening was provided.  Her friends agree that this is what she wanted and are glad that she was able to express this and be at peace with her decisions.  She had a good experience at [facility] in she would be glad to return when able.  Questions and concerns addressed.  Contact information provided.  PMT will continue to support holistically.   Length of Stay: 1   Physical Exam Vitals and nursing note reviewed.  Constitutional:      General: She is not in acute distress.    Appearance: She is ill-appearing.  Cardiovascular:     Rate and Rhythm: Tachycardia present.  Pulmonary:     Effort: Pulmonary effort is normal.  Skin:    General: Skin is warm and dry.  Neurological:     Mental Status: She is unresponsive.            Vital Signs: BP (!) 94/56 (BP Location: Right Arm)   Pulse (!) 128   Temp (!) 97.5 F (36.4 C) (Oral)   Resp 16   Ht 5\' 5"  (1.651 m)   Wt 64.3 kg   SpO2 95%   BMI  23.60 kg/m  SpO2: SpO2: 95 % O2 Device: O2 Device: Room Air O2 Flow Rate:        Palliative Assessment/Data: 10%    Palliative Care Assessment & Plan   Patient Profile: 86 y.o. female  with past medical history of stage 4 ovarian CA on hospice; HLD; chronic pain; OSA on CPAP; hemachromatosis; and CAD admitted  on 06/08/2022 with fall resulting in right hip fracture after attempting to go to the bathroom without her walker.    In the ED, MRI revealed extensive metastatic disease with pathological fracture and patient/family made the decision to transition to comfort care.  PMT has been consulted to assist with goals of care conversation.  Assessment: End-of-life care  Recommendations/Plan: Continue DNR Continue comfort care measures per MAR, no adjustments required today Patient is awaiting [place] review for eligibility, stable for transfer once a bed is available Psychosocial and emotional support provided PMT will continue to follow and support   Prognosis:  < 2 weeks  Discharge Planning: Hospice facility  Care plan was discussed with patient's friends   MDM high         [place], PA-C  Palliative Medicine Team Team phone # [phone]  Thank you for allowing the Palliative Medicine Team to assist in the care of this patient. Please utilize secure chat with additional questions, if there is no response within 30 minutes please call the above phone number.  Palliative Medicine Team providers are available by phone from 7am to 7pm daily and can be reached through the team cell phone.  Should this patient require assistance outside of these hours, please call the patient's attending physician.

## 2022-06-14 NOTE — Plan of Care (Signed)
  Problem: Pain Managment: Goal: General experience of comfort will improve Outcome: Progressing   Problem: Safety: Goal: Ability to remain free from injury will improve Outcome: Progressing   

## 2022-06-14 NOTE — Progress Notes (Signed)
[facility]) Hospital Liaison Note    [name] is a current hospice patient with a terminal diagnosis of hypertensive heart disease with chronic CHF. Patient brought into the ED after sustaining a fall at home. Patient has a DNR in place. She was admitted to [facility] on 06/15/2022 with a hip fracture. Per Dr. Konrad [name], [facility] MD, this is a related hospital admission.     Patient is GIP appropriate due to continuous IV Morphine drip.   V/S: Temp: 97.5,  Pulse: 128, RR: 16, BP: 94/56.   I/O:  PO - 0 IV: 74.9   Abnormal Labs:   K- 2.7 Chloride- 96  Alkaline phosphate - 222 Bilirubin- 1.5  Diagnostics:  CT Hip right w/o contrast IMPRESSION: 1. Large lytic lesion centered within the right humeral neck and intertrochanteric region measuring up to 4.0 cm with acute pathologic fracture with mild anterior apex angulation. 2. Numerous lytic lesions throughout the imaged pelvis and within the proximal right femoral diaphysis, seen to better advantage on concurrently obtained MRI. 3. Mixed solid and cystic mass in the low left pelvis measuring approximately 4.8 x 3.0 cm.   Aortic Atherosclerosis (ICD10-I70.0).   MR Femur Right w/o contrast IMPRESSION: 1. Widespread marrow replacing lesions throughout the bilateral femurs, right worse than left, and throughout the visualized pelvis. Findings are compatible with metastatic disease given history of cancer. 2. Large metastatic lesion involving the right femoral neck and intertrochanteric region measuring approximately 4.4 x 2.4 x 3.6 cm with acute pathologic fracture. Prominent edema and enhancement within the soft tissues anterior and medial to the fracture site which may be posttraumatic or represent extraosseous tumor infiltration. 3. Subtle edema and enhancement within the right vastus intermedius muscle adjacent to the proximal right femoral diaphyseal lesion which could be reactive or represent tumor  infiltration. 4. Partially imaged 4.7 cm solid and cystic mass in the low left hemipelvis may represent ovarian neoplasm seen on previous abdominopelvic CT from 02/28/2021. This finding is not well characterized on today's examination centered at the right femur.    DG Hip until. w/ Pelvis IMPRESSION: 1. Limited examination demonstrating an acute displaced and angulated transcervical right femoral neck fracture, as above. 2. Moderate bilateral hip joint osteoarthritis.  IV/PRN Meds:    Lactated Ringers 75 ml/hr Continuous IV Morphine 100 mg in NS 19mL- 5-10 ml/hr doses: 5-10 mg/hr; currently on 7.    Assessment and Plan: Hip fracture -transitioned to comfort care   Stage IV ovarian cancer -transitioned to comfort care   Hypokalemia OSA DNR     DVT prophylaxis:      Code Status: DNR Family Communication: friend at bedside   Disposition Plan:  Level of care: Palliative Care Status is: Inpatient Remains inpatient appropriate because: comfort care     Consultants:  Palliative care    Discharge Planning: Ongoing -    Family Contact: Spoke with son, Maria [name] multiple times on the phone. Saw Maria [name] at the bedside, she is unresponsive.   IDT: Updated   Goals of care: Morphine drip continuation for pain/comfort.     Hospital liaison team will continue to follow for discharge disposition.   Thank you,

## 2022-06-14 NOTE — Progress Notes (Signed)
PROGRESS NOTE    Maria [name]  ELF:[id number] DOB: 1936-07-24 DOA: 07/02/2022 PCP: Charlane Ferretti, MD    Brief Narrative:  86 y.o. female with medical history significant of stage 4 ovarian CA on hospice; HLD; chronic pain; OSA on CPAP; hemachromatosis; and CAD presenting with a fall.  CT and MRI were completed and both show extensive metastatic disease with pathological fracture. The patient and family were in agreement with transition to comfort only.  She will be started on a morphine drip.  [facility] was contacted for transfer to [facility] when able   Assessment and Plan: Hip fracture -transitioned to comfort care   Stage IV ovarian cancer -transitioned to comfort care   Hypokalemia OSA DNR   DVT prophylaxis:     Code Status: DNR Family Communication: friend at bedside  Disposition Plan:  Level of care: Palliative Care Status is: Inpatient Remains inpatient appropriate because: comfort care    Consultants:  Palliative care   Subjective: sleeping  Objective: Vitals:   06/27/2022 1639 06/10/2022 1748 06/30/2022 1750 06/14/22 0542  BP:  97/75 96/75 (!) 94/56  Pulse:  (!) 101 (!) 101 (!) 128  Resp:  16 17 16   Temp: 98.2 F (36.8 C) 98.6 F (37 C) 98.6 F (37 C) (!) 97.5 F (36.4 C)  TempSrc: Oral  Oral Oral  SpO2:  95% 95%   Weight:      Height:        Intake/Output Summary (Last 24 hours) at 06/14/2022 1239 Last data filed at 06/14/2022 0317 Gross per 24 hour  Intake 74.86 ml  Output --  Net 74.86 ml   Filed Weights   06/08/2022 0546  Weight: 64.3 kg    Examination:   Appears comfortable      Data Reviewed: I have personally reviewed following labs and imaging studies  CBC: Recent Labs  Lab 06/21/2022 0558  WBC 10.1  NEUTROABS 8.5*  HGB 14.8  HCT 42.6  MCV 89.1  PLT 585   Basic Metabolic Panel: Recent Labs  Lab 06/04/2022 0558  NA 137  K 2.7*  CL 96*  CO2 25  GLUCOSE 143*  BUN 11  CREATININE 0.78  CALCIUM 9.3    GFR: Estimated Creatinine Clearance: 47.1 mL/min (by C-G formula based on SCr of 0.78 mg/dL). Liver Function Tests: Recent Labs  Lab 06/22/2022 0558  AST 40  ALT 18  ALKPHOS 222*  BILITOT 1.5*  PROT 6.8  ALBUMIN 3.5   No results for input(s): "LIPASE", "AMYLASE" in the last 168 hours. No results for input(s): "AMMONIA" in the last 168 hours. Coagulation Profile: Recent Labs  Lab 06/23/2022 0558  INR 1.0   Cardiac Enzymes: No results for input(s): "CKTOTAL", "CKMB", "CKMBINDEX", "TROPONINI" in the last 168 hours. BNP (last 3 results) No results for input(s): "PROBNP" in the last 8760 hours. HbA1C: No results for input(s): "HGBA1C" in the last 72 hours. CBG: No results for input(s): "GLUCAP" in the last 168 hours. Lipid Profile: No results for input(s): "CHOL", "HDL", "LDLCALC", "TRIG", "CHOLHDL", "LDLDIRECT" in the last 72 hours. Thyroid Function Tests: No results for input(s): "TSH", "T4TOTAL", "FREET4", "T3FREE", "THYROIDAB" in the last 72 hours. Anemia Panel: No results for input(s): "VITAMINB12", "FOLATE", "FERRITIN", "TIBC", "IRON", "RETICCTPCT" in the last 72 hours. Sepsis Labs: No results for input(s): "PROCALCITON", "LATICACIDVEN" in the last 168 hours.  No results found for this or any previous visit (from the past 240 hour(s)).       Radiology Studies: CT HIP RIGHT WO  CONTRAST  Result Date: 06/17/2022 CLINICAL DATA:  Right hip fracture EXAM: CT OF THE RIGHT HIP WITHOUT CONTRAST TECHNIQUE: Multidetector CT imaging of the right hip was performed according to the standard protocol. Multiplanar CT image reconstructions were also generated. RADIATION DOSE REDUCTION: This exam was performed according to the departmental dose-optimization program which includes automated exposure control, adjustment of the mA and/or kV according to patient size and/or use of iterative reconstruction technique. COMPARISON:  Same day radiograph and MRI FINDINGS: Bones/Joint/Cartilage  Large lytic lesion centered within the right humeral neck and intertrochanteric region measuring approximately 4.0 x 2.6 x 3.3 cm (series 4, image 81) with acute pathologic fracture with mild anterior apex angulation. Numerous lytic lesions throughout the imaged pelvis and within the proximal right femoral diaphysis, seen to better advantage on concurrently obtained MRI. Cortical thinning and breakthrough of numerous lesions including right parasymphyseal pubic lesion (series 4, image 82) and posterior right acetabular lesion (series 4, image 73). Although no additional traumatic fracture is seen. Ligaments Suboptimally assessed by CT. Muscles and Tendons No acute musculotendinous abnormality by CT. Soft tissues Mixed solid and cystic mass in the low left pelvis measuring approximately 4.8 x 3.0 cm (series 8, image 62). Sigmoid diverticulosis. Aortoiliac atherosclerosis. IMPRESSION: 1. Large lytic lesion centered within the right humeral neck and intertrochanteric region measuring up to 4.0 cm with acute pathologic fracture with mild anterior apex angulation. 2. Numerous lytic lesions throughout the imaged pelvis and within the proximal right femoral diaphysis, seen to better advantage on concurrently obtained MRI. 3. Mixed solid and cystic mass in the low left pelvis measuring approximately 4.8 x 3.0 cm. Aortic Atherosclerosis (ICD10-I70.0). Electronically Signed   By: Davina Poke D.O.   On: 06/29/2022 12:42   MR FEMUR RIGHT W WO CONTRAST  Result Date: 06/17/2022 CLINICAL DATA:  Fall.  Right hip fracture.  History of cancer EXAM: MRI OF THE RIGHT FEMUR WITHOUT AND WITH CONTRAST TECHNIQUE: Multiplanar, multisequence MR imaging of the right femur was performed both before and after administration of intravenous contrast. CONTRAST:  70mL GADAVIST GADOBUTROL 1 MMOL/ML IV SOLN COMPARISON:  X-ray 06/22/2022 FINDINGS: Bones/Joint/Cartilage Widespread marrow replacing lesions throughout the bilateral femurs, right  worse than left, and throughout the visualized pelvis. Large metastatic lesion involving the right femoral neck and intertrochanteric region measuring approximately 4.4 x 2.4 x 3.6 cm with acute pathologic fracture. Mild anterior apex angulation at the fracture site. Prominent edema and enhancement within the soft tissues anterior and medial to the fracture site which may be posttraumatic or represent extraosseous tumor infiltration. Additional reference lesions as follows: 3.4 cm proximal right femoral diaphyseal lesion (series 4, image 21); 2.0 cm right anterior acetabular lesion (series 4, image 27); 2.4 cm right posterior acetabular lesion (series 4, image 19); and 3.5 cm left femoral intertrochanteric lesion (series 4, image 18). No additional acute pathologic fractures are identified. Bilateral hip joints intact without dislocation. Susceptibility artifact from bilateral knee prostheses. Ligaments Grossly intact. Muscles and Tendons Subtle edema and enhancement within the right vastus intermedius muscle adjacent to the proximal right femoral diaphyseal lesion which could be reactive or represent tumor infiltration (series 6, image 30). No acute tendinous abnormality is evident. Soft tissues Partially imaged solid and cystic mass in the low left hemipelvis measuring approximately 4.7 x 3.4 cm may represent ovarian neoplasm seen on previous abdominopelvic CT from 02/28/2021. This finding is not well characterized on today's examination centered at the right femur. IMPRESSION: 1. Widespread marrow replacing lesions throughout the bilateral femurs,  right worse than left, and throughout the visualized pelvis. Findings are compatible with metastatic disease given history of cancer. 2. Large metastatic lesion involving the right femoral neck and intertrochanteric region measuring approximately 4.4 x 2.4 x 3.6 cm with acute pathologic fracture. Prominent edema and enhancement within the soft tissues anterior and  medial to the fracture site which may be posttraumatic or represent extraosseous tumor infiltration. 3. Subtle edema and enhancement within the right vastus intermedius muscle adjacent to the proximal right femoral diaphyseal lesion which could be reactive or represent tumor infiltration. 4. Partially imaged 4.7 cm solid and cystic mass in the low left hemipelvis may represent ovarian neoplasm seen on previous abdominopelvic CT from 02/28/2021. This finding is not well characterized on today's examination centered at the right femur. Electronically Signed   By: Davina Poke D.O.   On: 06/08/2022 12:16   DG Chest 1 View  Result Date: 06/25/2022 CLINICAL DATA:  86 year old female with history of fall complaining of right hip pain. EXAM: CHEST  1 VIEW COMPARISON:  Chest x-ray 05/20/2022. FINDINGS: Postoperative changes of right upper lobectomy are again noted. No acute consolidative airspace disease. No pleural effusions. No pneumothorax. No evidence of pulmonary edema. Heart size is normal. Upper mediastinal contours are within normal limits. Contour abnormality in the lower aspect of the right lateral chest wall with destructive changes in the overlying ribs (likely at least the lateral right sixth rib), similar to the recent prior study. IMPRESSION: 1. Abnormal contour of the right lateral chest wall with apparent destructive changes in the lateral right sixth rib. Findings remain concerning for potential metastatic lesion, and correlation with contrast-enhanced chest CT is once again recommended. 2. Status post right upper lobectomy. Electronically Signed   By: Vinnie Langton M.D.   On: 06/19/2022 06:59   DG Hip Unilat With Pelvis 2-3 Views Right  Result Date: 06/05/2022 CLINICAL DATA:  86 year old female with history of fall complaining of right hip pain. EXAM: DG HIP (WITH OR WITHOUT PELVIS) 2-3V RIGHT COMPARISON:  No priors. FINDINGS: Three views of the right hip demonstrate an acute transcervical  femoral neck fracture in the distal aspect of the femoral neck adjacent to the intertrochanteric region. This is displaced with varus angulation, the degree of which is difficult to judge on the nonstandard views provided. Femoral head remains located in the acetabulum. No definite acute displaced fractures of the bony pelvic ring or the visualized portions of the left proximal femur. Joint space narrowing, subchondral sclerosis, subchondral cyst formation and osteophyte formation is noted in the hip joints bilaterally, indicative of moderate bilateral osteoarthritis. IMPRESSION: 1. Limited examination demonstrating an acute displaced and angulated transcervical right femoral neck fracture, as above. 2. Moderate bilateral hip joint osteoarthritis. Electronically Signed   By: Vinnie Langton M.D.   On: 06/17/2022 06:55        Scheduled Meds:  Continuous Infusions:  methocarbamol (ROBAXIN) IV     morphine 7 mg/hr (06/14/22 0454)     LOS: 1 day    Time spent: 45 minutes spent on chart review, discussion with nursing staff, consultants, updating family and interview/physical exam; more than 50% of that time was spent in counseling and/or coordination of care.    Geradine Girt, DO [facility] Available via Epic secure chat 7am-7pm After these hours, please refer to coverage provider listed on [email or web address] 06/14/2022, 12:39 PM

## 2022-06-15 DIAGNOSIS — Z66 Do not resuscitate: Secondary | ICD-10-CM | POA: Diagnosis not present

## 2022-06-15 DIAGNOSIS — S72001A Fracture of unspecified part of neck of right femur, initial encounter for closed fracture: Secondary | ICD-10-CM | POA: Diagnosis not present

## 2022-06-15 NOTE — Progress Notes (Addendum)
Patient [id number] Maria [name]      DOB: 1936-08-11      TMH:[id number]      Palliative Medicine Team    Subjective: Bedside symptom check completed. Friend bedside at time of visit.   Physical exam: Patient resting in bed with eyes closed at time of visit. Open mouthed breathing even and non-labored on room air, no excessive secretions noted. Patient without physical or non-verbal signs of pain or discomfort at this time. Patient unresponsive to verbal and light tactile stimulation, did not attempt to further evoke response by this RN.   Assessment and plan: At this time, patient remains stable for transfer to residential hospice if chosen and bed offered. Anticipate limited window for transfer, will continue to collaborate with care team about status. Friend bedside endorses that she has remained comfortable looking with current medication infusion. Friend reading poetry to patient, music played in background for patient preference. No needs identified from bedside RN at this time. Will continue to follow for any changes or advances.    Thank you for allowing the Palliative Medicine Team to assist in the care of this patient.     Damian Leavell, MSN, RN Palliative Medicine Team Team Phone: [phone]  This phone is monitored 7a-7p, please reach out to attending physician outside of these hours for urgent needs.

## 2022-06-15 NOTE — Progress Notes (Signed)
[facility]) Hospital Liaison Note    Mr. Dills is a current hospice patient with a terminal diagnosis of hypertensive heart disease with chronic CHF. Patient brought into the ED after sustaining a fall at home. Patient has a DNR in place. She was admitted to [facility] on 06/17/2022 with a hip fracture. Per Dr. Konrad Dolores, [facility] MD, this is a related hospital admission.    Patient visited at bedside with family friend present. Friend providing comforting presence and reading poetry to patient. Patient nonresponsive and showed to signs of pain/discomfort and does not awaken to light touch/voice. Patient open mouth breathing. MSW consulted with attending provider/Dr. Eliseo Squires of transfer to [facility] for EOL care and MD reports concerns of stabilization for transfer. ACC to continue to follow through hospitalization.  Patient is GIP appropriate due to continuous IV Morphine drip.   V/S:   06/06/2022 1639 06/11/2022 1748 06/22/2022 1750 06/14/22 0542  BP:   97/75 96/75 (!) 94/56  Pulse:   (!) 101 (!) 101 (!) 128  Resp:   16 17 16   Temp: 98.2 F (36.8 C) 98.6 F (37 C) 98.6 F (37 C) (!) 97.5 F (36.4 C)  TempSrc: Oral   Oral Oral  SpO2:   95% 95%    Weight:          Height:            I/O:  Intake/Output Summary (Last 24 hours) at 06/14/2022 1239 Last data filed at 06/14/2022 2025    Gross per 24 hour  Intake 74.86 ml  Output --  Net 74.86 ml   Abnormal Labs:  No labs drawn as of 1643  Diagnostics:  CT Hip right w/o contrast IMPRESSION: 1. Large lytic lesion centered within the right humeral neck and intertrochanteric region measuring up to 4.0 cm with acute pathologic fracture with mild anterior apex angulation. 2. Numerous lytic lesions throughout the imaged pelvis and within the proximal right femoral diaphysis, seen to better advantage on concurrently obtained MRI. 3. Mixed solid and cystic mass in the low left pelvis measuring approximately 4.8 x 3.0 cm.    Aortic Atherosclerosis (ICD10-I70.0).    MR Femur Right w/o contrast IMPRESSION: 1. Widespread marrow replacing lesions throughout the bilateral femurs, right worse than left, and throughout the visualized pelvis. Findings are compatible with metastatic disease given history of cancer. 2. Large metastatic lesion involving the right femoral neck and intertrochanteric region measuring approximately 4.4 x 2.4 x 3.6 cm with acute pathologic fracture. Prominent edema and enhancement within the soft tissues anterior and medial to the fracture site which may be posttraumatic or represent extraosseous tumor infiltration. 3. Subtle edema and enhancement within the right vastus intermedius muscle adjacent to the proximal right femoral diaphyseal lesion which could be reactive or represent tumor infiltration. 4. Partially imaged 4.7 cm solid and cystic mass in the low left hemipelvis may represent ovarian neoplasm seen on previous abdominopelvic CT from 02/28/2021. This finding is not well characterized on today's examination centered at the right femur.    DG Hip until. w/ Pelvis IMPRESSION: 1. Limited examination demonstrating an acute displaced and angulated transcervical right femoral neck fracture, as above. 2. Moderate bilateral hip joint osteoarthritis.   IV/PRN Meds:    Lactated Ringers 75 ml/hr Continuous IV Morphine 100 mg in NS 13mL- 5-10 ml/hr doses: 5-10 mg/hr; currently on 7.    Assessment and Plan: Hip fracture -transitioned to comfort care   Stage IV ovarian cancer -transitioned to comfort care  Hypokalemia OSA DNR     DVT prophylaxis:      Code Status: DNR Family Communication: friend at bedside   Disposition Plan:  Level of care: Palliative Care Status is: Inpatient Remains inpatient appropriate because: comfort care     Consultants:  Palliative care    Discharge Planning: Ongoing, patient is anticipated to be a hospital death.    Family  Contact:  Contacted son/Chris with no success. VM left requesting call is returned if needs arise.   IDT:  Updated   Goals of care: EOL comfort care.    Hospital liaison team will continue to follow for discharge disposition.    Thank you, Daphene Calamity, MSW [facility] Liaison [phone]

## 2022-06-15 NOTE — Progress Notes (Signed)
PROGRESS NOTE    Maria [name]  FAO:[id number] DOB: June 23, 1936 DOA: 06/06/2022 PCP: Charlane Ferretti, MD    Brief Narrative:  86 y.o. female with medical history significant of stage 4 ovarian CA on hospice; HLD; chronic pain; OSA on CPAP; hemachromatosis; and CAD presenting with a fall.  CT and MRI were completed and both show extensive metastatic disease with pathological fracture. The patient and family were in agreement with transition to comfort only.  She will be started on a morphine drip.  [facility] was contacted for transfer to [facility] when able but no bed available and now I suspect she will have an in hospital death.   Assessment and Plan: Hip fracture -transitioned to comfort care   Stage IV ovarian cancer -transitioned to comfort care   Hypokalemia OSA DNR   DVT prophylaxis:     Code Status: DNR Family Communication: friend at bedside-- notes her breathing has changed  Disposition Plan:  Level of care: Palliative Care Status is: Inpatient Remains inpatient appropriate because: comfort care    Consultants:  Palliative care   Subjective: Sleeping, not responsive to voice or touch  Objective: Vitals:   06/16/2022 1748 06/22/2022 1750 06/14/22 0542 06/15/22 0437  BP: 97/75 96/75 (!) 94/56 (!) 101/56  Pulse: (!) 101 (!) 101 (!) 128 (!) 110  Resp: 16 17 16 18   Temp: 98.6 F (37 C) 98.6 F (37 C) (!) 97.5 F (36.4 C) 98.2 F (36.8 C)  TempSrc:  Oral Oral Oral  SpO2: 95% 95%  (!) 89%  Weight:      Height:        Intake/Output Summary (Last 24 hours) at 06/15/2022 1141 Last data filed at 06/15/2022 0900 Gross per 24 hour  Intake 0 ml  Output --  Net 0 ml   Filed Weights   06/24/2022 0546  Weight: 64.3 kg    Examination:   In bed, not responsive to voice or touch      Data Reviewed: I have personally reviewed following labs and imaging studies  CBC: Recent Labs  Lab 06/30/2022 0558  WBC 10.1  NEUTROABS 8.5*  HGB 14.8  HCT 42.6   MCV 89.1  PLT 696   Basic Metabolic Panel: Recent Labs  Lab 06/24/2022 0558  NA 137  K 2.7*  CL 96*  CO2 25  GLUCOSE 143*  BUN 11  CREATININE 0.78  CALCIUM 9.3   GFR: Estimated Creatinine Clearance: 47.1 mL/min (by C-G formula based on SCr of 0.78 mg/dL). Liver Function Tests: Recent Labs  Lab 06/03/2022 0558  AST 40  ALT 18  ALKPHOS 222*  BILITOT 1.5*  PROT 6.8  ALBUMIN 3.5   No results for input(s): "LIPASE", "AMYLASE" in the last 168 hours. No results for input(s): "AMMONIA" in the last 168 hours. Coagulation Profile: Recent Labs  Lab 06/05/2022 0558  INR 1.0   Cardiac Enzymes: No results for input(s): "CKTOTAL", "CKMB", "CKMBINDEX", "TROPONINI" in the last 168 hours. BNP (last 3 results) No results for input(s): "PROBNP" in the last 8760 hours. HbA1C: No results for input(s): "HGBA1C" in the last 72 hours. CBG: No results for input(s): "GLUCAP" in the last 168 hours. Lipid Profile: No results for input(s): "CHOL", "HDL", "LDLCALC", "TRIG", "CHOLHDL", "LDLDIRECT" in the last 72 hours. Thyroid Function Tests: No results for input(s): "TSH", "T4TOTAL", "FREET4", "T3FREE", "THYROIDAB" in the last 72 hours. Anemia Panel: No results for input(s): "VITAMINB12", "FOLATE", "FERRITIN", "TIBC", "IRON", "RETICCTPCT" in the last 72 hours. Sepsis Labs: No results for  input(s): "PROCALCITON", "LATICACIDVEN" in the last 168 hours.  No results found for this or any previous visit (from the past 240 hour(s)).       Radiology Studies: CT HIP RIGHT WO CONTRAST  Result Date: 06/08/2022 CLINICAL DATA:  Right hip fracture EXAM: CT OF THE RIGHT HIP WITHOUT CONTRAST TECHNIQUE: Multidetector CT imaging of the right hip was performed according to the standard protocol. Multiplanar CT image reconstructions were also generated. RADIATION DOSE REDUCTION: This exam was performed according to the departmental dose-optimization program which includes automated exposure control,  adjustment of the mA and/or kV according to patient size and/or use of iterative reconstruction technique. COMPARISON:  Same day radiograph and MRI FINDINGS: Bones/Joint/Cartilage Large lytic lesion centered within the right humeral neck and intertrochanteric region measuring approximately 4.0 x 2.6 x 3.3 cm (series 4, image 81) with acute pathologic fracture with mild anterior apex angulation. Numerous lytic lesions throughout the imaged pelvis and within the proximal right femoral diaphysis, seen to better advantage on concurrently obtained MRI. Cortical thinning and breakthrough of numerous lesions including right parasymphyseal pubic lesion (series 4, image 82) and posterior right acetabular lesion (series 4, image 73). Although no additional traumatic fracture is seen. Ligaments Suboptimally assessed by CT. Muscles and Tendons No acute musculotendinous abnormality by CT. Soft tissues Mixed solid and cystic mass in the low left pelvis measuring approximately 4.8 x 3.0 cm (series 8, image 62). Sigmoid diverticulosis. Aortoiliac atherosclerosis. IMPRESSION: 1. Large lytic lesion centered within the right humeral neck and intertrochanteric region measuring up to 4.0 cm with acute pathologic fracture with mild anterior apex angulation. 2. Numerous lytic lesions throughout the imaged pelvis and within the proximal right femoral diaphysis, seen to better advantage on concurrently obtained MRI. 3. Mixed solid and cystic mass in the low left pelvis measuring approximately 4.8 x 3.0 cm. Aortic Atherosclerosis (ICD10-I70.0). Electronically Signed   By: Davina Poke D.O.   On: 07/01/2022 12:42        Scheduled Meds:  Continuous Infusions:  methocarbamol (ROBAXIN) IV     morphine 7 mg/hr (06/15/22 0736)     LOS: 2 days    Time spent: 35 minutes spent on chart review, discussion with nursing staff, consultants, updating family and interview/physical exam; more than 50% of that time was spent in counseling  and/or coordination of care.    Geradine Girt, DO [facility] Available via Epic secure chat 7am-7pm After these hours, please refer to coverage provider listed on [email or web address] 06/15/2022, 11:41 AM

## 2022-06-16 DIAGNOSIS — Z515 Encounter for palliative care: Secondary | ICD-10-CM | POA: Diagnosis not present

## 2022-06-16 DIAGNOSIS — S72001A Fracture of unspecified part of neck of right femur, initial encounter for closed fracture: Secondary | ICD-10-CM | POA: Diagnosis not present

## 2022-06-16 DIAGNOSIS — Z66 Do not resuscitate: Secondary | ICD-10-CM | POA: Diagnosis not present

## 2022-06-16 NOTE — Progress Notes (Addendum)
[facility]) Hospital Liaison Note    Maria [name] is a current hospice patient with a terminal diagnosis of carcinoma of the ovary. Patient brought into the ED after sustaining a fall at home. Patient has a DNR in place. She was admitted to [facility] on 06/04/2022 with a hip fracture. Per Dr. Konrad Dolores, [facility] MD, this is a related hospital admission.    Visited patient at the bedside, two close friends staying with her. She appears to be actively dying and transitioning. She is not stable to transfer to [facility]. Friends at the bedside were going to notify her sons of her change so they can come be with her.    Patient is inpatient appropriate as she is actively dying and has symptoms that need to be managed.  V/S: 96/59, 97.6 axillary, HR 111, RR 18 irregular, SPO2 98% on RA I&O: not recorded, dark brown urine noted in urine cannister Labs & Diagnostics: none IV/PRN: Morphine continuous @ 7 mg/hr continuous  Problem list: - right hip fracture - no surgical interventions, comfort care only - EOL - pt is actively dying, comfort measures in place  GOC: clear D/C planning: hospital death Family: friends at the bedside, updating her sons to come and be with her IDT: hospice team updated  If EMS transport is needed, [facility] contracts this services for our active hospice patients.  Transfer summary and med list to shadow chart.   Venia Carbon DNP, RN [facility] Liaison  [phone]

## 2022-06-16 NOTE — Progress Notes (Signed)
Patient FH:SVEXOGA LAVRA IMLER      DOB: Jul 04, 1936      CGB:[id number]      Palliative Medicine Team    Subjective: Bedside symptom check completed. Friend Rocky bedside at time of visit.   Physical exam: Patient resting in bed with eyes closed at time of visit. Open mouthed breathing even, slightly labored, no excessive secretions noted. Patient without physical or non-verbal signs of pain or discomfort at this time. Patient unresponsive at this time. Some change in breathing pattern and facial tone noted. Anticipate patient is nearing EOL.    Assessment and plan: At this time, patient appropriate to stay in house for death, not stable for transfer to residential facility. This RN provided therapeutic listening and storytelling opportunity to friend bedside. Bedside RN without any needs or concerns today, agree that she is actively dying in coming hours. Available to assist as needed. Will continue to follow for any changes or advances.    Thank you for allowing the Palliative Medicine Team to assist in the care of this patient.     Damian Leavell, MSN, RN Palliative Medicine Team Team Phone: [phone]  This phone is monitored 7a-7p, please reach out to attending physician outside of these hours for urgent needs.

## 2022-06-16 NOTE — TOC CAGE-AID Note (Signed)
Transition of Care [facility]) - CAGE-AID Screening   Patient Details  Name: Maria [name] MRN: [id number] Date of Birth: 1936/08/31  [facility]) CM/SW Contact:    Coralee Pesa, [place] Phone Number: 06/16/2022, 10:09 AM   Clinical Narrative: Per chart review, pt is not appropriate for CAGE- AID assessment at this time.    CAGE-AID Screening: Substance Abuse Screening unable to be completed due to: : Patient unable to participate             Substance Abuse Education Offered: No

## 2022-06-16 NOTE — Progress Notes (Signed)
PROGRESS NOTE    Maria [name]  EZM:[id number] DOB: 07-07-1936 DOA: 07/02/2022 PCP: Charlane Ferretti, MD    Brief Narrative:  86 y.o. female with medical history significant of stage 4 ovarian CA on hospice; HLD; chronic pain; OSA on CPAP; hemachromatosis; and CAD presenting with a fall.  CT and MRI were completed and both show extensive metastatic disease with pathological fracture. The patient and family were in agreement with transition to comfort only.  She will be started on a morphine drip.  [facility] was contacted for transfer to [facility] when able but no bed available and now I suspect she will have an in hospital death.   Assessment and Plan: Hip fracture -transitioned to comfort care   Stage IV ovarian cancer -transitioned to comfort care   Hypokalemia OSA DNR   DVT prophylaxis:     Code Status: DNR Family Communication: son at bedside  Disposition Plan:  Level of care: Palliative Care Status is: Inpatient Remains inpatient appropriate because: comfort care- suspect in hospital death    Consultants:  Palliative care   Subjective: sleeping  Objective: Vitals:   06/14/22 0542 06/15/22 0437 06/16/22 0300 06/16/22 0450  BP: (!) 94/56 (!) 101/56 (!) 96/59 (!) 96/59  Pulse: (!) 128 (!) 110 (!) 111 (!) 113  Resp: 16 18 19 18   Temp: (!) 97.5 F (36.4 C) 98.2 F (36.8 C) 97.6 F (36.4 C)   TempSrc: Oral Oral Axillary   SpO2:  (!) 89% (!) 83% (!) 82%  Weight:      Height:        Intake/Output Summary (Last 24 hours) at 06/16/2022 0919 Last data filed at 06/15/2022 1308 Gross per 24 hour  Intake 0 ml  Output --  Net 0 ml   Filed Weights   06/06/2022 0546  Weight: 64.3 kg    Examination:   In bed, sleeping, mouth open Slightly mottled fingers      Data Reviewed: I have personally reviewed following labs and imaging studies  CBC: Recent Labs  Lab 06/22/2022 0558  WBC 10.1  NEUTROABS 8.5*  HGB 14.8  HCT 42.6  MCV 89.1  PLT 503    Basic Metabolic Panel: Recent Labs  Lab 06/15/2022 0558  NA 137  K 2.7*  CL 96*  CO2 25  GLUCOSE 143*  BUN 11  CREATININE 0.78  CALCIUM 9.3   GFR: Estimated Creatinine Clearance: 47.1 mL/min (by C-G formula based on SCr of 0.78 mg/dL). Liver Function Tests: Recent Labs  Lab 06/12/2022 0558  AST 40  ALT 18  ALKPHOS 222*  BILITOT 1.5*  PROT 6.8  ALBUMIN 3.5   No results for input(s): "LIPASE", "AMYLASE" in the last 168 hours. No results for input(s): "AMMONIA" in the last 168 hours. Coagulation Profile: Recent Labs  Lab 06/20/2022 0558  INR 1.0   Cardiac Enzymes: No results for input(s): "CKTOTAL", "CKMB", "CKMBINDEX", "TROPONINI" in the last 168 hours. BNP (last 3 results) No results for input(s): "PROBNP" in the last 8760 hours. HbA1C: No results for input(s): "HGBA1C" in the last 72 hours. CBG: No results for input(s): "GLUCAP" in the last 168 hours. Lipid Profile: No results for input(s): "CHOL", "HDL", "LDLCALC", "TRIG", "CHOLHDL", "LDLDIRECT" in the last 72 hours. Thyroid Function Tests: No results for input(s): "TSH", "T4TOTAL", "FREET4", "T3FREE", "THYROIDAB" in the last 72 hours. Anemia Panel: No results for input(s): "VITAMINB12", "FOLATE", "FERRITIN", "TIBC", "IRON", "RETICCTPCT" in the last 72 hours. Sepsis Labs: No results for input(s): "PROCALCITON", "LATICACIDVEN" in the last  168 hours.  No results found for this or any previous visit (from the past 240 hour(s)).       Radiology Studies: No results found.      Scheduled Meds:  Continuous Infusions:  methocarbamol (ROBAXIN) IV     morphine 7 mg/hr (06/16/22 0604)     LOS: 3 days    Time spent: 35 minutes spent on chart review, discussion with nursing staff, consultants, updating family and interview/physical exam; more than 50% of that time was spent in counseling and/or coordination of care.    Geradine Girt, DO [facility] Available via Epic secure chat 7am-7pm After  these hours, please refer to coverage provider listed on [email or web address] 06/16/2022, 9:19 AM

## 2022-06-16 NOTE — Progress Notes (Signed)
8/15 Pt under [place], IM Letter respectfully not given.

## 2022-06-16 NOTE — Care Management Important Message (Signed)
Important Message  Patient Details  Name: Maria [name] MRN: [id number] Date of Birth: Aug 11, 1936   Medicare Important Message Given:  No     Hannah Beat 06/16/2022, 12:08 PM

## 2022-06-17 DIAGNOSIS — C569 Malignant neoplasm of unspecified ovary: Secondary | ICD-10-CM | POA: Diagnosis not present

## 2022-06-17 DIAGNOSIS — R0602 Shortness of breath: Secondary | ICD-10-CM

## 2022-06-17 DIAGNOSIS — S72001A Fracture of unspecified part of neck of right femur, initial encounter for closed fracture: Secondary | ICD-10-CM | POA: Diagnosis not present

## 2022-06-17 DIAGNOSIS — W19XXXA Unspecified fall, initial encounter: Secondary | ICD-10-CM

## 2022-06-17 DIAGNOSIS — Z789 Other specified health status: Secondary | ICD-10-CM

## 2022-06-17 DIAGNOSIS — C799 Secondary malignant neoplasm of unspecified site: Secondary | ICD-10-CM | POA: Diagnosis not present

## 2022-06-17 DIAGNOSIS — Z515 Encounter for palliative care: Secondary | ICD-10-CM

## 2022-06-17 NOTE — Progress Notes (Signed)
Daily Progress Note   Patient Name: Maria [name]       Date: 06/17/2022 DOB: 02-09-1936  Age: 86 y.o. MRN#: [id number] Attending Physician: Nita Sells, MD Primary Care Physician: Charlane Ferretti, MD Admit Date: 06/05/2022  Reason for Consultation/Follow-up: Non pain symptom management, Pain control, Psychosocial/spiritual support, and Terminal Care  Subjective: Chart review performed - noted Dr. Arlyss Queen request to RN for morphine drip to be increased. Received report from primary RN - no acute concerns. RN reports she has not yet increased morphine drip. Per RN, patient is unresponsive, not eating/drinking.  Went to visit patient at bedside - friend/Betty, friend/Dotty, friend/Lynda, and ex-daughter-in-law/Michelle at bedside. Patient was lying in bed unresponsive. Her jaw is relaxed open. No signs or non-verbal gestures of pain or discomfort noted. No respiratory distress or secretions noted; increased work of breathing noted. Increased morphine drip to 39m/hr and provided 268mbolus.   Emotional support and therapeutic listening provided as friends share past memories of patient and how they all met her. They describe her as a beautiful person who everyone loved to be around.   Natural trajectory at EOL reviewed with those present per their request.  All questions and concerns addressed. Encouraged to call with questions and/or concerns. PMT card provided.  Requested RN call for comfort tray to room.   Length of Stay: 4  Current Medications: Scheduled Meds:    Continuous Infusions:  methocarbamol (ROBAXIN) IV     morphine 10 mg/hr (06/17/22 1118)    PRN Meds: acetaminophen **OR** acetaminophen, antiseptic oral rinse, bisacodyl, diphenhydrAMINE, glycopyrrolate **OR**  glycopyrrolate **OR** glycopyrrolate, haloperidol **OR** haloperidol **OR** haloperidol lactate, LORazepam **OR** LORazepam **OR** LORazepam, methocarbamol **OR** methocarbamol (ROBAXIN) IV, morphine, ondansetron **OR** ondansetron (ZOFRAN) IV, oxyCODONE, polyethylene glycol, polyvinyl alcohol  Physical Exam Vitals and nursing note reviewed.  Constitutional:      General: She is not in acute distress.    Appearance: She is cachectic. She is ill-appearing.  Pulmonary:     Effort: No respiratory distress.     Comments: Increased work of breathing Skin:    General: Skin is warm and dry.  Neurological:     Mental Status: She is unresponsive.     Motor: Weakness present.             Vital Signs: BP 135/72   Pulse (!Marland Kitchen  123   Temp 98.2 F (36.8 C) (Oral)   Resp 18   Ht _0  (1.651 m)   Wt 64.3 kg   SpO2 (!) 79%   BMI 23.60 kg/m  SpO2: SpO2: (!) 79 % O2 Device: O2 Device: Room Air O2 Flow Rate:    Intake/output summary:  Intake/Output Summary (Last 24 hours) at 06/17/2022 1209 Last data filed at 06/17/2022 0500 Gross per 24 hour  Intake 0 ml  Output 350 ml  Net -350 ml   LBM:   Baseline Weight: Weight: 64.3 kg Most recent weight: Weight: 64.3 kg       Palliative Assessment/Data: PPS 10%      Patient Active Problem List   Diagnosis Date Noted   Hip fracture ([place]) 06/03/2022   DNR (do not resuscitate) 06/09/2022   Pressure injury of skin 05/21/2022   Generalized weakness 05/20/2022   Hypomagnesemia 05/20/2022   Nausea and vomiting 05/20/2022   FTT (failure to thrive) in adult 05/20/2022   Brain lesion 05/20/2022   Rib lesion 05/20/2022   High grade ovarian cancer ([place]) 09/18/2021   Coronary atherosclerosis due to calcified coronary lesion 09/17/2021   Degenerative cervical spinal stenosis 02/03/2021   Routine general medical examination at a health care facility 02/03/2021   Weakness of right hand 12/31/2020   Arthritis of midfoot 06/10/2020   HTN  (hypertension) 11/08/2019   Hemochromatosis 09/06/2019   Preventative health care 06/16/2016   OSA (obstructive sleep apnea) 06/15/2016   Depression 05/14/2016   Multiple thyroid nodules 11/24/2015   Degenerative arthritis of lumbar spine 09/03/2014   Hyponatremia 12/12/2013   Hypokalemia 12/12/2013   OA (osteoarthritis) of knee 12/11/2013   Osteoarthritis 11/14/2013   BENIGN POSITIONAL VERTIGO 01/17/2010   Urinary incontinence 04/26/2009   Hyperlipidemia, mixed 02/04/2009   Vitamin D deficiency 10/23/2008   Osteoporosis 10/23/2008   NEOPLASM, MALIGNANT, LUNG, NON-SMALL CELL 11/21/2007   DIVERTICULOSIS 06/15/2007   GOITER, NODULAR 04/14/2007    Palliative Care Assessment & Plan   Patient Profile: 86 y.o. female  with past medical history of stage 4 ovarian CA on hospice; HLD; chronic pain; OSA on CPAP; hemachromatosis; and CAD admitted on 06/30/2022 with fall resulting in right hip fracture after attempting to go to the bathroom without her walker.    In the ED, MRI revealed extensive metastatic disease with pathological fracture and patient/family made the decision to transition to comfort care.  PMT has been consulted to assist with goals of care conversation.  Assessment: Principal Problem:   Hip fracture (HCC) Active Problems:   Hypokalemia   High grade ovarian cancer (HCC)   DNR (do not resuscitate)   Terminal care  Recommendations/Plan: Continue full comfort measures Continue DNR/DNI as previously documented Patient not stable for transfer to hospice facility - anticipate hospital death Continue current comfort focused medication regimen  Agree with morphine drip increase  PMT will continue to follow and support holistically  Goals of Care and Additional Recommendations: Limitations on Scope of Treatment: Full Comfort Care  Code Status:    Code Status Orders  (From admission, onward)           Start     Ordered   06/12/2022 1510  Do not attempt  resuscitation (DNR)  Continuous       Question Answer Comment  In the event of cardiac or respiratory ARREST Do not call a "code blue"   In the event of cardiac or respiratory ARREST Do not perform Intubation, CPR, defibrillation or ACLS  In the event of cardiac or respiratory ARREST Use medication by any route, position, wound care, and other measures to relive pain and suffering. May use oxygen, suction and manual treatment of airway obstruction as needed for comfort.      07/02/2022 1510           Code Status History     Date Active Date Inactive Code Status Order ID Comments User Context   07/02/2022 0841 07/02/2022 1510 DNR [id number]  Karmen Bongo, MD ED   05/20/2022 1615 05/23/2022 2104 DNR [id number]  Jonnie Finner, DO ED   05/20/2022 1434 05/20/2022 1615 DNR [id number]  Jonnie Finner, DO ED   06/28/2017 1256 06/30/2017 1852 Full Code [id number]  Gaynelle Arabian, MD Inpatient   12/11/2013 1746 12/14/2013 1506 Full Code [id number]  Aluisio, Dione Plover, MD Inpatient      Advance Directive Documentation    Flowsheet Row Most Recent Value  Type of Advance Directive Healthcare Power of Attorney, Living will  Pre-existing out of facility DNR order (yellow form or pink MOST form) --  "MOST" Form in Place? --       Prognosis:  Hours - Days  Discharge Planning: Anticipated Hospital Death  Care plan was discussed with primary RN, patient's friends at bedside  Thank you for allowing the Palliative Medicine Team to assist in the care of this patient.  Lin Landsman, NP  Please contact Palliative Medicine Team phone at [phone] for questions and concerns.   *Portions of this note are a verbal dictation therefore any spelling and/or grammatical errors are due to the "[employer]" system interpretation.

## 2022-06-17 NOTE — Progress Notes (Signed)
PROGRESS NOTE    Maria [name]  LKJ:[id number] DOB: 1936-03-28 DOA: 06/02/2022 PCP: Charlane Ferretti, MD    Brief Narrative:  86 y.o. female with medical history significant of stage 4 ovarian CA on hospice; HLD; chronic pain; OSA on CPAP; hemachromatosis; and CAD presenting with a fall.  CT and MRI were completed and both show extensive metastatic disease with pathological fracture. The patient and family were in agreement with transition to comfort only.   [facility] was contacted for transfer to [employer] [facility]--- I suspect she will have an in hospital death.   Assessment and Plan: Hip fracture -transitioned to comfort care   Stage IV ovarian cancer -transitioned to comfort care -I have asked that nursing increase the rate of the morphine drip to 10 mg/h as she has deep sighing respirations   Hypokalemia OSA DNR   DVT prophylaxis:     Code Status: DNR Family Communication: Caregiver at bedside  Disposition Plan:  Level of care: Palliative Care Status is: Inpatient Remains inpatient appropriate because: comfort care- suspect in hospital death    Consultants:  Palliative care   Subjective: Patient is unarousable and has a deep sighing respirations She does not awaken even with significantly shaking her  Objective: Vitals:   06/15/22 0437 06/16/22 0300 06/16/22 0450 06/17/22 0313  BP: (!) 101/56 (!) 96/59 (!) 96/59 135/72  Pulse: (!) 110 (!) 111 (!) 113 (!) 123  Resp: 18 19 18    Temp: 98.2 F (36.8 C) 97.6 F (36.4 C)  98.2 F (36.8 C)  TempSrc: Oral Axillary  Oral  SpO2: (!) 89% (!) 83% (!) 82% (!) 79%  Weight:      Height:        Intake/Output Summary (Last 24 hours) at 06/17/2022 1114 Last data filed at 06/17/2022 0500 Gross per 24 hour  Intake 0 ml  Output 350 ml  Net -350 ml    Filed Weights   06/21/2022 0546  Weight: 64.3 kg    Examination:  Deep sighing respirations very dry mucosa Unarousable Episodes of apnea noted Abdomen  soft   Data Reviewed: I have personally reviewed following labs and imaging studies  CBC: Recent Labs  Lab 06/27/2022 0558  WBC 10.1  NEUTROABS 8.5*  HGB 14.8  HCT 42.6  MCV 89.1  PLT 794    Basic Metabolic Panel: Recent Labs  Lab 06/24/2022 0558  NA 137  K 2.7*  CL 96*  CO2 25  GLUCOSE 143*  BUN 11  CREATININE 0.78  CALCIUM 9.3    GFR: Estimated Creatinine Clearance: 47.1 mL/min (by C-G formula based on SCr of 0.78 mg/dL). Liver Function Tests: Recent Labs  Lab 06/07/2022 0558  AST 40  ALT 18  ALKPHOS 222*  BILITOT 1.5*  PROT 6.8  ALBUMIN 3.5    No results for input(s): "LIPASE", "AMYLASE" in the last 168 hours. No results for input(s): "AMMONIA" in the last 168 hours. Coagulation Profile: Recent Labs  Lab 06/07/2022 0558  INR 1.0    Cardiac Enzymes: No results for input(s): "CKTOTAL", "CKMB", "CKMBINDEX", "TROPONINI" in the last 168 hours. BNP (last 3 results) No results for input(s): "PROBNP" in the last 8760 hours. HbA1C: No results for input(s): "HGBA1C" in the last 72 hours. CBG: No results for input(s): "GLUCAP" in the last 168 hours. Lipid Profile: No results for input(s): "CHOL", "HDL", "LDLCALC", "TRIG", "CHOLHDL", "LDLDIRECT" in the last 72 hours. Thyroid Function Tests: No results for input(s): "TSH", "T4TOTAL", "FREET4", "T3FREE", "THYROIDAB" in the last 72 hours.  Anemia Panel: No results for input(s): "VITAMINB12", "FOLATE", "FERRITIN", "TIBC", "IRON", "RETICCTPCT" in the last 72 hours. Sepsis Labs: No results for input(s): "PROCALCITON", "LATICACIDVEN" in the last 168 hours.  No results found for this or any previous visit (from the past 240 hour(s)).       Radiology Studies: No results found.      Scheduled Meds:  Continuous Infusions:  methocarbamol (ROBAXIN) IV     morphine 7 mg/hr (06/17/22 1027)     LOS: 4 days    Time spent: 35 minutes spent on chart review, discussion with nursing staff, consultants, updating  family and interview/physical exam; more than 50% of that time was spent in counseling and/or coordination of care.    Nita Sells, DO [facility] Available via Epic secure chat 7am-7pm After these hours, please refer to coverage provider listed on [email or web address] 06/17/2022, 11:14 AM

## 2022-06-17 NOTE — Progress Notes (Signed)
[facility]) Hospital Liaison Note    Mrs. Ramone is a current hospice patient with a terminal diagnosis of carcinoma of the ovary. Patient brought into the ED after sustaining a fall at home. Patient has a DNR in place. She was admitted to [facility] on 06/09/2022 with a hip fracture. Per Dr. Konrad Dolores, [facility] MD, this is a related hospital admission.    Visited patient at the bedside, two close friends staying with her. She is actively drying, mouth open, eyes ajar, mouth is dry, having periods of apnea, her fingers are cyanotic.    Patient is inpatient appropriate as she is actively dying and has symptoms that need to be managed.   V/S: 135/72, 98.2 oral, HR 123, RR 18 irregular, SPO2 79% on RA I&O: not recorded, dark brown urine noted in urine cannister Labs & Diagnostics: none IV/PRN: Morphine continuous @ 10 mg/hr continuous   Problem list: - right hip fracture - no surgical interventions, comfort care only - EOL - pt is actively dying, comfort measures in place   GOC: clear D/C planning: hospital death Family: friends at the bedside, son on his way to be with her IDT: hospice team updated   If EMS transport is needed, [facility] contracts this services for our active hospice patients.      Venia Carbon DNP, RN [facility] Liaison  [phone]

## 2022-06-17 NOTE — Progress Notes (Signed)
Patient XQ:JJHERDE ELEINA JERGENS      DOB: 07/17/1936      YCX:[id number]      Palliative Medicine Team    Subjective: Bedside symptom check completed. Irving Burton bedside at time of visit.    Physical exam: Patient resting in bed with eyes closed at time of visit. Open mouthed breathing even and non-labored on room air, no excessive secretions noted. Patient without physical or non-verbal signs of pain or discomfort at this time. Patient remains unresponsive at this time, no large changes from assessment yesterday. Extremities remain warm with palpable distal pulses, no mottling noted.    Assessment and plan: Plan to continue current regimen and support patient/loved ones with anticipated in hospital death. Do not recommend transferring to outside residential hospice facility at this time. No needs from bedside RN or visiting friend. Will continue to follow for any changes or advances.    Thank you for allowing the Palliative Medicine Team to assist in the care of this patient.     Damian Leavell, MSN, RN Palliative Medicine Team Team Phone: [phone]  This phone is monitored 7a-7p, please reach out to attending physician outside of these hours for urgent needs.

## 2022-06-18 DIAGNOSIS — Z515 Encounter for palliative care: Secondary | ICD-10-CM | POA: Diagnosis not present

## 2022-06-18 DIAGNOSIS — C799 Secondary malignant neoplasm of unspecified site: Secondary | ICD-10-CM | POA: Diagnosis not present

## 2022-06-18 DIAGNOSIS — W19XXXA Unspecified fall, initial encounter: Secondary | ICD-10-CM | POA: Diagnosis not present

## 2022-06-18 DIAGNOSIS — S72001A Fracture of unspecified part of neck of right femur, initial encounter for closed fracture: Secondary | ICD-10-CM

## 2022-06-18 MED ORDER — MORPHINE BOLUS VIA INFUSION: 2.0000 mg | INTRAVENOUS | Status: DC | PRN

## 2022-06-19 NOTE — Progress Notes (Signed)
   07-07-22 2303  Attending [place]  Attending Physician Notified Y  Attending Physician (First and Last Name) Samtani,Jai-Gurmukh,MD  Post Mortem Checklist  Date of Death 07-Jul-2022  Time of Death 69  Pronounced By Gwendolyn Fill and Liston Alba  Next of kin notified Yes  Name of next of kin notified of death Aerionna Moravek  Contact Person's Relationship to Patient Son  Contact Person's Phone Number [phone]  Contact Person's address [place] [phone]  Family Communication Notes Family at bedside  Was the patient a No Code Blue or a Limited Code Blue? No  Did the patient die unattended? No  Patient restrained? Not applicable  Height 5\' 5"  (1.651 m)  Weight 64.3 kg  [employer]previously known as [employer])  Notification Date 07-Jul-2022  Notification Time 2325  [employer] Number [id number]  Is patient a potential donor? N (spoke with Gentry Fitz and noted patient is ruled out for organ donation.)  Autopsy  Autopsy requested by N/A  Patient and [place] Returned  Patient is satisfied that all belongings have been returned? Not applicable  Valuables returned to? No belongings at bedside  Specify valuables returned not applicable  Dermatherapy linen/gowns NOT sent with patient or transporter Not applicable  Notifications  Patient Placement notified that Post Mortem checklist is complete Yes  Patient Placement notified body transferred Transported to morgue  Other Notifications Lavera Guise) MD notification  Medical Examiner  Is this a medical examiner's case? Y (admitted for fall)  Date Medical Examiner notified 2022/07/07  Time Medical Examiner notified 2330  Name of [place]  Name of person who notified medical examiner Abelino Derrick  Does the Medical Examiner consider this an ME case? [place] home name/address/phone # [place] - [phone]  Planned  location of [place]

## 2022-06-29 ENCOUNTER — Other Ambulatory Visit: Payer: Medicare Other | Admitting: Hospice

## 2022-07-03 NOTE — Progress Notes (Signed)
    OVERNIGHT PROGRESS REPORT  Notified by RN that patient has expired at 2200 hrs.  Patient was DNR/comfort care followed by Palliative.  2 RN verified.  Family was immediately available to RN.     Gershon Cull MSNA ACNPC-AG Acute Care Nurse Practitioner [place]

## 2022-07-03 NOTE — Progress Notes (Signed)
PROGRESS NOTE    KENNETTE [name]  PFX:[id number] DOB: 21-Sep-1936 DOA: 06/25/2022 PCP: Charlane Ferretti, MD    Brief Narrative:  86 y.o. female with medical history significant of stage 4 ovarian CA on hospice; HLD; chronic pain; OSA on CPAP; hemachromatosis; and CAD presenting with a fall.  CT and MRI were completed and both show extensive metastatic disease with pathological fracture. The patient and family were in agreement with transition to comfort only.   [facility] was contacted for transfer to [facility]--- I suspect she will have an in hospital death.   Assessment and Plan: Hip fracture -transitioned to comfort care   Stage IV ovarian cancer -transitioned to comfort care -received IV bolus Moprhine earlier today -increase the rate of the morphine drip to 14 mg/h as she has deep sighing respirations   Hypokalemia OSA DNR   DVT prophylaxis:     Code Status: DNR Family Communication: Caregiver at bedside  Disposition Plan:  Level of care: Palliative Care Status is: Inpatient Remains inpatient appropriate because: comfort care- suspect in hospital death    Consultants:  Palliative care   Subjective:  Unarousable She has some mottling of her extremities now--they are still warm  Objective: Vitals:   06/15/22 0437 06/16/22 0300 06/16/22 0450 06/17/22 0313  BP: (!) 101/56 (!) 96/59 (!) 96/59 135/72  Pulse: (!) 110 (!) 111 (!) 113 (!) 123  Resp: 18 19 18    Temp:  97.6 F (36.4 C)  98.2 F (36.8 C)  TempSrc: Oral Axillary  Oral  SpO2: (!) 89% (!) 83% (!) 82% (!) 79%  Weight:      Height:       No intake or output data in the 24 hours ending 2022-06-25 1431  Filed Weights   06/17/2022 0546  Weight: 64.3 kg    Examination:   sighing respirations very dry mucosa Unarousable Abdomen soft No overt fever  Data Reviewed: I have personally reviewed following labs and imaging studies  CBC: Recent Labs  Lab 06/02/2022 0558  WBC 10.1  NEUTROABS 8.5*   HGB 14.8  HCT 42.6  MCV 89.1  PLT 329    Basic Metabolic Panel: Recent Labs  Lab 06/05/2022 0558  NA 137  K 2.7*  CL 96*  CO2 25  GLUCOSE 143*  BUN 11  CREATININE 0.78  CALCIUM 9.3    GFR: Estimated Creatinine Clearance: 47.1 mL/min (by C-G formula based on SCr of 0.78 mg/dL). Liver Function Tests: Recent Labs  Lab 06/08/2022 0558  AST 40  ALT 18  ALKPHOS 222*  BILITOT 1.5*  PROT 6.8  ALBUMIN 3.5    No results for input(s): "LIPASE", "AMYLASE" in the last 168 hours. No results for input(s): "AMMONIA" in the last 168 hours. Coagulation Profile: Recent Labs  Lab 06/25/2022 0558  INR 1.0    Cardiac Enzymes: No results for input(s): "CKTOTAL", "CKMB", "CKMBINDEX", "TROPONINI" in the last 168 hours. BNP (last 3 results) No results for input(s): "PROBNP" in the last 8760 hours. HbA1C: No results for input(s): "HGBA1C" in the last 72 hours. CBG: No results for input(s): "GLUCAP" in the last 168 hours. Lipid Profile: No results for input(s): "CHOL", "HDL", "LDLCALC", "TRIG", "CHOLHDL", "LDLDIRECT" in the last 72 hours. Thyroid Function Tests: No results for input(s): "TSH", "T4TOTAL", "FREET4", "T3FREE", "THYROIDAB" in the last 72 hours. Anemia Panel: No results for input(s): "VITAMINB12", "FOLATE", "FERRITIN", "TIBC", "IRON", "RETICCTPCT" in the last 72 hours. Sepsis Labs: No results for input(s): "PROCALCITON", "LATICACIDVEN" in the last 168 hours.  No results found for this or any previous visit (from the past 240 hour(s)).       Radiology Studies: No results found.      Scheduled Meds:  Continuous Infusions:  methocarbamol (ROBAXIN) IV     morphine 11 mg/hr (07-18-22 1059)     LOS: 5 days    Time spent: 35 minutes spent on chart review, discussion with nursing staff, consultants, updating family and interview/physical exam; more than 50% of that time was spent in counseling and/or coordination of care.    Nita Sells, DO [facility] Available via Epic secure chat 7am-7pm After these hours, please refer to coverage provider listed on [email or web address] 07-18-22, 2:31 PM

## 2022-07-03 NOTE — Progress Notes (Signed)
Patient is a   palliative care,patient has no pulse ,no respiration, heart and lung sounds absent,at 2200,son  at beside and a family friend.MD notified.

## 2022-07-03 NOTE — Progress Notes (Addendum)
[facility]) Hospital Liaison Note    Mrs. Surles is a current hospice patient with a terminal diagnosis of carcinoma of the ovary. Patient brought into the ED after sustaining a fall at home. Patient has a DNR in place. She was admitted to [facility] on 06/26/2022 with a hip fracture. Per Dr. Konrad Dolores, [facility] MD, this is a related hospital admission.    Visited Mrs. Mcniel at bedside. Two friends present at the bedside, provided support. Patient is unresponsive, resting comfortably, mouth open, breathing through her mouth, shallow and regular, eyes closed, skin warm, fingers mottling. Report exchanged with hospital team. Morphine drip date increased to $RemoveBefo'14mg'aXReTnHsjtr$ /hr.      Patient is inpatient appropriate as she is actively dying and requires symptom management to keep her comfortable.   V/S: not recorded today.  I&O: not recorded today.   Labs: none today.   Diagnostics: none today.   IV/PRN:  morphine $RemoveB'100mg'WZkSaLOQ$  in NS 164mL ($RemoveB'1mg'vxsVILtI$ /mL) infusion - premix - 14 mg/hr continuous, IV    Problem list:   Assessment and Plan: Hip fracture -transitioned to comfort care   Stage IV ovarian cancer -transitioned to comfort care -received IV bolus Moprhine earlier today -increase the rate of the morphine drip to 14 mg/h as she has deep sighing respirations  GOC: DNR is in place.   D/C planning: This is an anticipated hospital death.  Family: Met with friends at the bedside, provided support.   IDT: Updated   Should patient need ambulance transfer - Please use [facility]) as they contract this service for our active hospice patients.      Zigmund Gottron, RN [facility] Liaison  [phone]

## 2022-07-03 NOTE — Progress Notes (Signed)
Daily Progress Note   Patient Name: Maria [name]       Date: June 28, 2022 DOB: 08-16-1936  Age: 86 y.o. MRN#: [id number] Attending Physician: Nita Sells, MD Primary Care Physician: Charlane Ferretti, MD Admit Date: 06/04/2022  Reason for Consultation/Follow-up: Non pain symptom management, Pain control, Psychosocial/spiritual support, and Terminal Care  Subjective: Chart review performed. Received report from primary RN and Dr. Verlon Au - no acute concerns.   Went to visit patient at bedside - friend/Rocky present. Patient was lying in bed unresponsive. Jaw relaxed open. Warm upper and lower extremities. No signs or non-verbal gestures of pain or discomfort noted. No respiratory distress or secretions noted; mild increased work of breathing that is concerning to visitor. Increased basal rate to 53m/hr. Provided 298mbolus dose.  Emotional support provided to [place] trajectory at EOL reviewed. Addressed visitors concern patient's eye was bleeding - per assessment is normal soft tissue under eye lid. Education provided that oxygen is considered life prolonging and instead of escalating oxygen, we rather manage symptoms with comfort medications - she expressed understanding. Reviewed why vital signs are not taken often.   Therapeutic and emotional support provided as she tells me how she met patient and their "good memories" together.   Another friend entered room - reviewed all information above per her request. Reviewed last set of vital signs.   All questions and concerns addressed. Encouraged to call with questions and/or concerns. PMT card provided.  [facility] liaisons entered room - provided updates.  Reviewed with RN morphine infusion had been increased and dose range  would be provided in order if additional increases was needed.  Length of Stay: 5  Current Medications: Scheduled Meds:    Continuous Infusions:  methocarbamol (ROBAXIN) IV     morphine 10 mg/hr (08Aug 27, 2023649)    PRN Meds: acetaminophen **OR** acetaminophen, antiseptic oral rinse, bisacodyl, diphenhydrAMINE, glycopyrrolate **OR** glycopyrrolate **OR** glycopyrrolate, haloperidol **OR** haloperidol **OR** haloperidol lactate, LORazepam **OR** LORazepam **OR** LORazepam, methocarbamol **OR** methocarbamol (ROBAXIN) IV, morphine, ondansetron **OR** ondansetron (ZOFRAN) IV, polyethylene glycol, polyvinyl alcohol  Physical Exam Vitals and nursing note reviewed.  Constitutional:      General: She is not in acute distress.    Appearance: She is cachectic. She is ill-appearing.  Pulmonary:     Effort: No respiratory distress.     Comments:  Increased work of breathing Skin:    General: Skin is warm and dry.  Neurological:     Mental Status: She is unresponsive.     Motor: Weakness present.             Vital Signs: BP 135/72   Pulse (!) 123   Temp 98.2 F (36.8 C) (Oral)   Resp 18   Ht _0  (1.651 m)   Wt 64.3 kg   SpO2 (!) 79%   BMI 23.60 kg/m  SpO2: SpO2: (!) 79 % O2 Device: O2 Device: Room Air O2 Flow Rate:    Intake/output summary: No intake or output data in the 24 hours ending 07-18-22 0841 LBM: Last BM Date : 06/16/22 Baseline Weight: Weight: 64.3 kg Most recent weight: Weight: 64.3 kg       Palliative Assessment/Data: PPS 10%      Patient Active Problem List   Diagnosis Date Noted   Hip fracture ([place]) 06/11/2022   DNR (do not resuscitate) 06/29/2022   Pressure injury of skin 05/21/2022   Generalized weakness 05/20/2022   Hypomagnesemia 05/20/2022   Nausea and vomiting 05/20/2022   FTT (failure to thrive) in adult 05/20/2022   Brain lesion 05/20/2022   Rib lesion 05/20/2022   High grade ovarian cancer ([place]) 09/18/2021   Coronary atherosclerosis due  to calcified coronary lesion 09/17/2021   Degenerative cervical spinal stenosis 02/03/2021   Routine general medical examination at a health care facility 02/03/2021   Weakness of right hand 12/31/2020   Arthritis of midfoot 06/10/2020   HTN (hypertension) 11/08/2019   Hemochromatosis 09/06/2019   Preventative health care 06/16/2016   OSA (obstructive sleep apnea) 06/15/2016   Depression 05/14/2016   Multiple thyroid nodules 11/24/2015   Degenerative arthritis of lumbar spine 09/03/2014   Hyponatremia 12/12/2013   Hypokalemia 12/12/2013   OA (osteoarthritis) of knee 12/11/2013   Osteoarthritis 11/14/2013   BENIGN POSITIONAL VERTIGO 01/17/2010   Urinary incontinence 04/26/2009   Hyperlipidemia, mixed 02/04/2009   Vitamin D deficiency 10/23/2008   Osteoporosis 10/23/2008   NEOPLASM, MALIGNANT, LUNG, NON-SMALL CELL 11/21/2007   DIVERTICULOSIS 06/15/2007   GOITER, NODULAR 04/14/2007    Palliative Care Assessment & Plan   Patient Profile: 86 y.o. female  with past medical history of stage 4 ovarian CA on hospice; HLD; chronic pain; OSA on CPAP; hemachromatosis; and CAD admitted on 06/20/2022 with fall resulting in right hip fracture after attempting to go to the bathroom without her walker.    In the ED, MRI revealed extensive metastatic disease with pathological fracture and patient/family made the decision to transition to comfort care.  PMT has been consulted to assist with goals of care conversation.  Assessment: Principal Problem:   Hip fracture (HCC) Active Problems:   Hypokalemia   High grade ovarian cancer (HCC)   DNR (do not resuscitate)   Terminal care  Recommendations/Plan: Continue full comfort measures Continue DNR/DNI as previously documented Patient not stable for transfer to hospice facility - anticipate hospital death Continue current comfort focused medication regimen  Increased continuous morphine infusion to 11 mg/hr with ordered dose range to include  10-50m/hr; increased bolus dose range to [id number] PMT will continue to follow and support holistically  Goals of Care and Additional Recommendations: Limitations on Scope of Treatment: Full Comfort Care  Code Status:    Code Status Orders  (From admission, onward)           Start     Ordered   06/09/2022 1510  Do not attempt resuscitation (DNR)  Continuous       Question Answer Comment  In the event of cardiac or respiratory ARREST Do not call a "code blue"   In the event of cardiac or respiratory ARREST Do not perform Intubation, CPR, defibrillation or ACLS   In the event of cardiac or respiratory ARREST Use medication by any route, position, wound care, and other measures to relive pain and suffering. May use oxygen, suction and manual treatment of airway obstruction as needed for comfort.      06/12/2022 1510           Code Status History     Date Active Date Inactive Code Status Order ID Comments User Context   06/30/2022 0841 06/06/2022 1510 DNR [id number]  Karmen Bongo, MD ED   05/20/2022 1615 05/23/2022 2104 DNR [id number]  Jonnie Finner, DO ED   05/20/2022 1434 05/20/2022 1615 DNR [id number]  Jonnie Finner, DO ED   06/28/2017 1256 06/30/2017 1852 Full Code [id number]  Gaynelle Arabian, MD Inpatient   12/11/2013 1746 12/14/2013 1506 Full Code [id number]  Aluisio, Dione Plover, MD Inpatient      Advance Directive Documentation    Flowsheet Row Most Recent Value  Type of Advance Directive Healthcare Power of Attorney, Living will  Pre-existing out of facility DNR order (yellow form or pink MOST form) --  "MOST" Form in Place? --       Prognosis:  Hours - Days  Discharge Planning: Anticipated Hospital Death  Care plan was discussed with primary RN, patient's visitors, [facility] liaison, Dr. Verlon Au  Thank you for allowing the Palliative Medicine Team to assist in the care of this patient.   Lin Landsman, NP  Please contact Palliative Medicine Team phone at  [phone] for questions and concerns.   *Portions of this note are a verbal dictation therefore any spelling and/or grammatical errors are due to the "[employer]" system interpretation.

## 2022-07-03 NOTE — Death Summary Note (Signed)
DEATH SUMMARY   Patient Details  Name: Maria [name] MRN: [id number] DOB: 06/03/1936 URK:YHCW, Siri Cole, MD Admission/Discharge Information   Admit Date:  06/22/2022  Date of Death: Date of Death: Jun 27, 2022  Time of Death: Time of Death: 26  Length of Stay: 5   Principle Cause of death: Acute respiratory failure in the setting of advanced cancer  Hospital Diagnoses: Principal Problem:   Hip fracture ([place]) Active Problems:   Hypokalemia   High grade ovarian cancer ([place])   DNR (do not resuscitate)   Hospital Course: 86 y.o. female with medical history significant of stage 4 ovarian CA on hospice; HLD; chronic pain; OSA on CPAP; hemachromatosis; and CAD presenting with a fall.  CT and MRI were completed and both show extensive metastatic disease with pathological fracture. The patient and family were in agreement with transition to comfort only.   [facility] was contacted for transfer to [facility]  Patient however seem to have stertorous breathing during hospital stay and did not seem very stable for transport Patient was initiated on comfort trajectory with medications inclusive of morphine and was observed and medications were adjusted based on air hunger She became more mottled in her lower extremities on 06-28-23 On 06-28-23 p.m. at around 2200 she was found to be pulseless and unresponsive Condolences were expressed family and patient was declared has deceased   The results of significant diagnostics from this hospitalization (including imaging, microbiology, ancillary and laboratory) are listed below for reference.   Significant Diagnostic Studies: CT HIP RIGHT WO CONTRAST  Result Date: 06/22/2022 CLINICAL DATA:  Right hip fracture EXAM: CT OF THE RIGHT HIP WITHOUT CONTRAST TECHNIQUE: Multidetector CT imaging of the right hip was performed according to the standard protocol. Multiplanar CT image reconstructions were also generated. RADIATION DOSE REDUCTION: This exam was  performed according to the departmental dose-optimization program which includes automated exposure control, adjustment of the mA and/or kV according to patient size and/or use of iterative reconstruction technique. COMPARISON:  Same day radiograph and MRI FINDINGS: Bones/Joint/Cartilage Large lytic lesion centered within the right humeral neck and intertrochanteric region measuring approximately 4.0 x 2.6 x 3.3 cm (series 4, image 81) with acute pathologic fracture with mild anterior apex angulation. Numerous lytic lesions throughout the imaged pelvis and within the proximal right femoral diaphysis, seen to better advantage on concurrently obtained MRI. Cortical thinning and breakthrough of numerous lesions including right parasymphyseal pubic lesion (series 4, image 82) and posterior right acetabular lesion (series 4, image 73). Although no additional traumatic fracture is seen. Ligaments Suboptimally assessed by CT. Muscles and Tendons No acute musculotendinous abnormality by CT. Soft tissues Mixed solid and cystic mass in the low left pelvis measuring approximately 4.8 x 3.0 cm (series 8, image 62). Sigmoid diverticulosis. Aortoiliac atherosclerosis. IMPRESSION: 1. Large lytic lesion centered within the right humeral neck and intertrochanteric region measuring up to 4.0 cm with acute pathologic fracture with mild anterior apex angulation. 2. Numerous lytic lesions throughout the imaged pelvis and within the proximal right femoral diaphysis, seen to better advantage on concurrently obtained MRI. 3. Mixed solid and cystic mass in the low left pelvis measuring approximately 4.8 x 3.0 cm. Aortic Atherosclerosis (ICD10-I70.0). Electronically Signed   By: Davina Poke D.O.   On: 06/22/22 12:42   MR FEMUR RIGHT W WO CONTRAST  Result Date: 2022/06/22 CLINICAL DATA:  Fall.  Right hip fracture.  History of cancer EXAM: MRI OF THE RIGHT FEMUR WITHOUT AND WITH CONTRAST TECHNIQUE: Multiplanar, multisequence MR  imaging of the right femur was performed both before and after administration of intravenous contrast. CONTRAST:  87mL GADAVIST GADOBUTROL 1 MMOL/ML IV SOLN COMPARISON:  X-ray 06/25/2022 FINDINGS: Bones/Joint/Cartilage Widespread marrow replacing lesions throughout the bilateral femurs, right worse than left, and throughout the visualized pelvis. Large metastatic lesion involving the right femoral neck and intertrochanteric region measuring approximately 4.4 x 2.4 x 3.6 cm with acute pathologic fracture. Mild anterior apex angulation at the fracture site. Prominent edema and enhancement within the soft tissues anterior and medial to the fracture site which may be posttraumatic or represent extraosseous tumor infiltration. Additional reference lesions as follows: 3.4 cm proximal right femoral diaphyseal lesion (series 4, image 21); 2.0 cm right anterior acetabular lesion (series 4, image 27); 2.4 cm right posterior acetabular lesion (series 4, image 19); and 3.5 cm left femoral intertrochanteric lesion (series 4, image 18). No additional acute pathologic fractures are identified. Bilateral hip joints intact without dislocation. Susceptibility artifact from bilateral knee prostheses. Ligaments Grossly intact. Muscles and Tendons Subtle edema and enhancement within the right vastus intermedius muscle adjacent to the proximal right femoral diaphyseal lesion which could be reactive or represent tumor infiltration (series 6, image 30). No acute tendinous abnormality is evident. Soft tissues Partially imaged solid and cystic mass in the low left hemipelvis measuring approximately 4.7 x 3.4 cm may represent ovarian neoplasm seen on previous abdominopelvic CT from 02/28/2021. This finding is not well characterized on today's examination centered at the right femur. IMPRESSION: 1. Widespread marrow replacing lesions throughout the bilateral femurs, right worse than left, and throughout the visualized pelvis. Findings are  compatible with metastatic disease given history of cancer. 2. Large metastatic lesion involving the right femoral neck and intertrochanteric region measuring approximately 4.4 x 2.4 x 3.6 cm with acute pathologic fracture. Prominent edema and enhancement within the soft tissues anterior and medial to the fracture site which may be posttraumatic or represent extraosseous tumor infiltration. 3. Subtle edema and enhancement within the right vastus intermedius muscle adjacent to the proximal right femoral diaphyseal lesion which could be reactive or represent tumor infiltration. 4. Partially imaged 4.7 cm solid and cystic mass in the low left hemipelvis may represent ovarian neoplasm seen on previous abdominopelvic CT from 02/28/2021. This finding is not well characterized on today's examination centered at the right femur. Electronically Signed   By: Davina Poke D.O.   On: 06/22/2022 12:16   DG Chest 1 View  Result Date: 06/22/2022 CLINICAL DATA:  86 year old female with history of fall complaining of right hip pain. EXAM: CHEST  1 VIEW COMPARISON:  Chest x-ray 05/20/2022. FINDINGS: Postoperative changes of right upper lobectomy are again noted. No acute consolidative airspace disease. No pleural effusions. No pneumothorax. No evidence of pulmonary edema. Heart size is normal. Upper mediastinal contours are within normal limits. Contour abnormality in the lower aspect of the right lateral chest wall with destructive changes in the overlying ribs (likely at least the lateral right sixth rib), similar to the recent prior study. IMPRESSION: 1. Abnormal contour of the right lateral chest wall with apparent destructive changes in the lateral right sixth rib. Findings remain concerning for potential metastatic lesion, and correlation with contrast-enhanced chest CT is once again recommended. 2. Status post right upper lobectomy. Electronically Signed   By: Vinnie Langton M.D.   On: 06/17/2022 06:59   DG Hip  Unilat With Pelvis 2-3 Views Right  Result Date: 06/20/2022 CLINICAL DATA:  86 year old female with history of fall complaining of right hip  pain. EXAM: DG HIP (WITH OR WITHOUT PELVIS) 2-3V RIGHT COMPARISON:  No priors. FINDINGS: Three views of the right hip demonstrate an acute transcervical femoral neck fracture in the distal aspect of the femoral neck adjacent to the intertrochanteric region. This is displaced with varus angulation, the degree of which is difficult to judge on the nonstandard views provided. Femoral head remains located in the acetabulum. No definite acute displaced fractures of the bony pelvic ring or the visualized portions of the left proximal femur. Joint space narrowing, subchondral sclerosis, subchondral cyst formation and osteophyte formation is noted in the hip joints bilaterally, indicative of moderate bilateral osteoarthritis. IMPRESSION: 1. Limited examination demonstrating an acute displaced and angulated transcervical right femoral neck fracture, as above. 2. Moderate bilateral hip joint osteoarthritis. Electronically Signed   By: Vinnie Langton M.D.   On: 06/19/2022 06:55    Microbiology: No results found for this or any previous visit (from the past 240 hour(s)).  Time spent: 20 minutes  Signed: Nita Sells, MD 06/29/2022

## 2022-07-03 DEATH — deceased

## 2022-11-25 ENCOUNTER — Other Ambulatory Visit (HOSPITAL_COMMUNITY): Payer: Self-pay
